# Patient Record
Sex: Female | Born: 1955 | Race: White | Hispanic: No | Marital: Single | Smoking: Never smoker
Health system: Southern US, Community
[De-identification: ages and names within clinical notes are randomized; demographics above are authoritative.]

## PROBLEM LIST (undated history)

## (undated) ENCOUNTER — Emergency Department (HOSPITAL_BASED_OUTPATIENT_CLINIC_OR_DEPARTMENT_OTHER): Discharge status: Absent without leave | Payer: BLUE CROSS/BLUE SHIELD

## (undated) DIAGNOSIS — Z87898 Personal history of other specified conditions: Secondary | ICD-10-CM

## (undated) DIAGNOSIS — I890 Lymphedema, not elsewhere classified: Secondary | ICD-10-CM

## (undated) DIAGNOSIS — F419 Anxiety disorder, unspecified: Secondary | ICD-10-CM

## (undated) DIAGNOSIS — C801 Malignant (primary) neoplasm, unspecified: Secondary | ICD-10-CM

## (undated) DIAGNOSIS — G43009 Migraine without aura, not intractable, without status migrainosus: Secondary | ICD-10-CM

## (undated) DIAGNOSIS — K9041 Non-celiac gluten sensitivity: Secondary | ICD-10-CM

## (undated) DIAGNOSIS — G4489 Other headache syndrome: Secondary | ICD-10-CM

## (undated) DIAGNOSIS — E785 Hyperlipidemia, unspecified: Secondary | ICD-10-CM

## (undated) DIAGNOSIS — E039 Hypothyroidism, unspecified: Secondary | ICD-10-CM

## (undated) DIAGNOSIS — C50919 Malignant neoplasm of unspecified site of unspecified female breast: Secondary | ICD-10-CM

## (undated) DIAGNOSIS — R232 Flushing: Secondary | ICD-10-CM

## (undated) DIAGNOSIS — G4700 Insomnia, unspecified: Secondary | ICD-10-CM

## (undated) DIAGNOSIS — R0789 Other chest pain: Secondary | ICD-10-CM

## (undated) DIAGNOSIS — F329 Major depressive disorder, single episode, unspecified: Secondary | ICD-10-CM

## (undated) DIAGNOSIS — D649 Anemia, unspecified: Secondary | ICD-10-CM

## (undated) DIAGNOSIS — Z923 Personal history of irradiation: Secondary | ICD-10-CM

## (undated) DIAGNOSIS — Z9221 Personal history of antineoplastic chemotherapy: Secondary | ICD-10-CM

## (undated) DIAGNOSIS — F32A Depression, unspecified: Secondary | ICD-10-CM

## (undated) DIAGNOSIS — B029 Zoster without complications: Secondary | ICD-10-CM

## (undated) DIAGNOSIS — Z973 Presence of spectacles and contact lenses: Secondary | ICD-10-CM

## (undated) DIAGNOSIS — Z8249 Family history of ischemic heart disease and other diseases of the circulatory system: Secondary | ICD-10-CM

## (undated) HISTORY — DX: Non-celiac gluten sensitivity: K90.41

## (undated) HISTORY — DX: Zoster without complications: B02.9

## (undated) HISTORY — DX: Personal history of other specified conditions: Z87.898

## (undated) HISTORY — DX: Migraine without aura, not intractable, without status migrainosus: G43.009

## (undated) HISTORY — DX: Family history of ischemic heart disease and other diseases of the circulatory system: Z82.49

## (undated) HISTORY — PX: GLAUCOMA SURGERY: SHX656

## (undated) HISTORY — DX: Major depressive disorder, single episode, unspecified: F32.9

## (undated) HISTORY — DX: Lymphedema, not elsewhere classified: I89.0

## (undated) HISTORY — PX: CATARACT EXTRACTION: SUR2

## (undated) HISTORY — DX: Depression, unspecified: F32.A

## (undated) HISTORY — PX: TONSILLECTOMY: SUR1361

## (undated) HISTORY — PX: DILATION AND CURETTAGE OF UTERUS: SHX78

## (undated) HISTORY — DX: Hypothyroidism, unspecified: E03.9

## (undated) HISTORY — DX: Hyperlipidemia, unspecified: E78.5

## (undated) HISTORY — DX: Flushing: R23.2

## (undated) HISTORY — PX: OTHER SURGICAL HISTORY: SHX169

## (undated) HISTORY — DX: Other headache syndrome: G44.89

## (undated) HISTORY — DX: Other chest pain: R07.89

## (undated) HISTORY — DX: Anxiety disorder, unspecified: F41.9

---

## 2003-08-24 ENCOUNTER — Ambulatory Visit (HOSPITAL_COMMUNITY): Admission: RE | Admit: 2003-08-24 | Discharge: 2003-08-24 | Payer: Self-pay | Admitting: Internal Medicine

## 2004-09-15 ENCOUNTER — Ambulatory Visit: Payer: Self-pay | Admitting: Internal Medicine

## 2005-02-25 ENCOUNTER — Ambulatory Visit: Payer: Self-pay | Admitting: Internal Medicine

## 2005-06-10 ENCOUNTER — Ambulatory Visit: Payer: Self-pay | Admitting: Internal Medicine

## 2006-04-13 ENCOUNTER — Ambulatory Visit: Payer: Self-pay | Admitting: Internal Medicine

## 2006-06-17 ENCOUNTER — Ambulatory Visit: Payer: Self-pay | Admitting: Internal Medicine

## 2006-06-23 ENCOUNTER — Other Ambulatory Visit: Admission: RE | Admit: 2006-06-23 | Discharge: 2006-06-23 | Payer: Self-pay | Admitting: Internal Medicine

## 2006-06-23 ENCOUNTER — Encounter (INDEPENDENT_AMBULATORY_CARE_PROVIDER_SITE_OTHER): Payer: Self-pay | Admitting: *Deleted

## 2006-06-23 ENCOUNTER — Ambulatory Visit: Payer: Self-pay | Admitting: Internal Medicine

## 2006-10-26 DIAGNOSIS — Z87898 Personal history of other specified conditions: Secondary | ICD-10-CM

## 2006-10-26 HISTORY — DX: Personal history of other specified conditions: Z87.898

## 2007-04-06 ENCOUNTER — Ambulatory Visit: Payer: Self-pay | Admitting: Internal Medicine

## 2007-04-06 LAB — CONVERTED CEMR LAB
ALT: 15 units/L (ref 0–40)
AST: 16 units/L (ref 0–37)
Albumin: 3.7 g/dL (ref 3.5–5.2)
Alkaline Phosphatase: 45 units/L (ref 39–117)
BUN: 12 mg/dL (ref 6–23)
Basophils Absolute: 0 10*3/uL (ref 0.0–0.1)
Basophils Relative: 0.6 % (ref 0.0–1.0)
Bilirubin, Direct: 0.1 mg/dL (ref 0.0–0.3)
CO2: 28 meq/L (ref 19–32)
Calcium: 9.4 mg/dL (ref 8.4–10.5)
Chloride: 102 meq/L (ref 96–112)
Creatinine, Ser: 0.8 mg/dL (ref 0.4–1.2)
Eosinophils Absolute: 0.2 10*3/uL (ref 0.0–0.6)
Eosinophils Relative: 3.5 % (ref 0.0–5.0)
Free T4: 0.8 ng/dL (ref 0.6–1.6)
GFR calc Af Amer: 97 mL/min
GFR calc non Af Amer: 80 mL/min
Glucose, Bld: 91 mg/dL (ref 70–99)
HCT: 36.6 % (ref 36.0–46.0)
Hemoglobin: 12.6 g/dL (ref 12.0–15.0)
Lymphocytes Relative: 38.7 % (ref 12.0–46.0)
MCHC: 34.4 g/dL (ref 30.0–36.0)
MCV: 89.4 fL (ref 78.0–100.0)
Magnesium: 2.3 mg/dL (ref 1.5–2.5)
Monocytes Absolute: 0.6 10*3/uL (ref 0.2–0.7)
Monocytes Relative: 8.1 % (ref 3.0–11.0)
Neutro Abs: 3.4 10*3/uL (ref 1.4–7.7)
Neutrophils Relative %: 49.1 % (ref 43.0–77.0)
Platelets: 357 10*3/uL (ref 150–400)
Potassium: 4.5 meq/L (ref 3.5–5.1)
RBC: 4.09 M/uL (ref 3.87–5.11)
RDW: 13.6 % (ref 11.5–14.6)
Sodium: 137 meq/L (ref 135–145)
TSH: 4.83 microintl units/mL (ref 0.35–5.50)
Total Bilirubin: 0.6 mg/dL (ref 0.3–1.2)
Total Protein: 7 g/dL (ref 6.0–8.3)
WBC: 6.9 10*3/uL (ref 4.5–10.5)

## 2007-05-11 ENCOUNTER — Encounter: Payer: Self-pay | Admitting: Internal Medicine

## 2007-05-18 ENCOUNTER — Encounter: Payer: Self-pay | Admitting: Internal Medicine

## 2007-05-20 ENCOUNTER — Telehealth: Payer: Self-pay | Admitting: Internal Medicine

## 2007-05-27 ENCOUNTER — Telehealth: Payer: Self-pay | Admitting: Internal Medicine

## 2007-06-09 ENCOUNTER — Encounter: Payer: Self-pay | Admitting: Internal Medicine

## 2007-10-22 ENCOUNTER — Emergency Department (HOSPITAL_COMMUNITY): Admission: EM | Admit: 2007-10-22 | Discharge: 2007-10-22 | Payer: Self-pay | Admitting: Emergency Medicine

## 2007-11-29 ENCOUNTER — Ambulatory Visit: Payer: Self-pay | Admitting: Internal Medicine

## 2007-11-29 DIAGNOSIS — R51 Headache: Secondary | ICD-10-CM | POA: Insufficient documentation

## 2007-11-29 DIAGNOSIS — J019 Acute sinusitis, unspecified: Secondary | ICD-10-CM | POA: Insufficient documentation

## 2007-11-29 DIAGNOSIS — J069 Acute upper respiratory infection, unspecified: Secondary | ICD-10-CM | POA: Insufficient documentation

## 2007-11-29 DIAGNOSIS — R519 Headache, unspecified: Secondary | ICD-10-CM | POA: Insufficient documentation

## 2007-11-29 DIAGNOSIS — H409 Unspecified glaucoma: Secondary | ICD-10-CM | POA: Insufficient documentation

## 2007-11-29 DIAGNOSIS — E039 Hypothyroidism, unspecified: Secondary | ICD-10-CM | POA: Insufficient documentation

## 2007-11-30 ENCOUNTER — Telehealth: Payer: Self-pay | Admitting: Internal Medicine

## 2007-12-01 ENCOUNTER — Telehealth: Payer: Self-pay | Admitting: Internal Medicine

## 2007-12-02 ENCOUNTER — Telehealth: Payer: Self-pay | Admitting: Internal Medicine

## 2008-05-25 ENCOUNTER — Telehealth: Payer: Self-pay | Admitting: *Deleted

## 2008-07-24 ENCOUNTER — Ambulatory Visit: Payer: Self-pay | Admitting: Internal Medicine

## 2008-07-24 DIAGNOSIS — F341 Dysthymic disorder: Secondary | ICD-10-CM | POA: Insufficient documentation

## 2008-07-24 DIAGNOSIS — E785 Hyperlipidemia, unspecified: Secondary | ICD-10-CM | POA: Insufficient documentation

## 2008-07-24 DIAGNOSIS — R5381 Other malaise: Secondary | ICD-10-CM | POA: Insufficient documentation

## 2008-07-24 DIAGNOSIS — N959 Unspecified menopausal and perimenopausal disorder: Secondary | ICD-10-CM | POA: Insufficient documentation

## 2008-07-24 DIAGNOSIS — R5383 Other fatigue: Secondary | ICD-10-CM

## 2008-07-26 ENCOUNTER — Encounter: Payer: Self-pay | Admitting: *Deleted

## 2008-07-31 ENCOUNTER — Telehealth: Payer: Self-pay | Admitting: *Deleted

## 2008-07-31 ENCOUNTER — Ambulatory Visit: Payer: Self-pay | Admitting: Internal Medicine

## 2008-08-27 ENCOUNTER — Telehealth: Payer: Self-pay | Admitting: Internal Medicine

## 2008-08-27 ENCOUNTER — Encounter: Payer: Self-pay | Admitting: Internal Medicine

## 2008-08-27 LAB — CONVERTED CEMR LAB
ALT: 14 units/L (ref 0–35)
AST: 17 units/L (ref 0–37)
Albumin: 3.8 g/dL (ref 3.5–5.2)
Alkaline Phosphatase: 38 units/L — ABNORMAL LOW (ref 39–117)
BUN: 17 mg/dL (ref 6–23)
Basophils Absolute: 0 10*3/uL (ref 0.0–0.1)
Basophils Relative: 0.3 % (ref 0.0–3.0)
Bilirubin, Direct: 0.1 mg/dL (ref 0.0–0.3)
CO2: 29 meq/L (ref 19–32)
Calcium: 9.2 mg/dL (ref 8.4–10.5)
Chloride: 102 meq/L (ref 96–112)
Cholesterol: 194 mg/dL (ref 0–200)
Creatinine, Ser: 0.8 mg/dL (ref 0.4–1.2)
Eosinophils Absolute: 0.3 10*3/uL (ref 0.0–0.7)
Eosinophils Relative: 4.1 % (ref 0.0–5.0)
GFR calc Af Amer: 96 mL/min
GFR calc non Af Amer: 79 mL/min
Glucose, Bld: 95 mg/dL (ref 70–99)
HCT: 34.8 % — ABNORMAL LOW (ref 36.0–46.0)
HDL: 39.3 mg/dL (ref >39.0)
Hemoglobin: 11.7 g/dL — ABNORMAL LOW (ref 12.0–15.0)
LDL Cholesterol: 137 mg/dL — ABNORMAL HIGH (ref 0–99)
Lymphocytes Relative: 37.1 % (ref 12.0–46.0)
MCHC: 33.7 g/dL (ref 30.0–36.0)
MCV: 92.1 fL (ref 78.0–100.0)
Monocytes Absolute: 0.7 10*3/uL (ref 0.1–1.0)
Monocytes Relative: 10.4 % (ref 3.0–12.0)
Neutro Abs: 3.2 10*3/uL (ref 1.4–7.7)
Neutrophils Relative %: 48.1 % (ref 43.0–77.0)
Platelets: 315 10*3/uL (ref 150–400)
Potassium: 4.4 meq/L (ref 3.5–5.1)
RBC: 3.78 M/uL — ABNORMAL LOW (ref 3.87–5.11)
RDW: 13.7 % (ref 11.5–14.6)
Sodium: 138 meq/L (ref 135–145)
TSH: 5.1 microintl units/mL (ref 0.35–5.50)
Total Bilirubin: 0.7 mg/dL (ref 0.3–1.2)
Total CHOL/HDL Ratio: 4.9
Total Protein: 6.9 g/dL (ref 6.0–8.3)
Triglycerides: 90 mg/dL (ref 0–149)
VLDL: 18 mg/dL (ref 0–40)
WBC: 6.7 10*3/uL (ref 4.5–10.5)

## 2009-05-20 ENCOUNTER — Telehealth: Payer: Self-pay | Admitting: *Deleted

## 2011-03-13 NOTE — Letter (Signed)
June 03, 2007    Chalmers Guest, M.D.  [address]  [name]  [place], [place] [address]   RE:  Kimberly Reed, Kimberly Reed  MRN:  [id number]  /  DOB:  1955-12-05   Dear Dr. Harlon Flor,   After review of [name] neurologic evaluation and my current  evaluation she should average risk to undergo her glaucoma surgery.   Her EKG on June 23, 2006 was within normal limits.  She is on thyroid  replacement therapy and her lab work of 06/08 was within normal limits.   Please let us know if you need more information.    Sincerely,      Neta Mends. Panosh, MD  Electronically Signed    WKP/MedQ  DD: 06/03/2007  DT: 06/03/2007  Job #: [id number]

## 2011-03-16 ENCOUNTER — Ambulatory Visit (INDEPENDENT_AMBULATORY_CARE_PROVIDER_SITE_OTHER): Payer: Managed Care, Other (non HMO) | Admitting: Internal Medicine

## 2011-03-16 ENCOUNTER — Encounter: Payer: Self-pay | Admitting: Internal Medicine

## 2011-03-16 VITALS — BP 150/100 | HR 78 | Temp 98.4°F | Wt 298.0 lb

## 2011-03-16 DIAGNOSIS — E785 Hyperlipidemia, unspecified: Secondary | ICD-10-CM

## 2011-03-16 DIAGNOSIS — Z8249 Family history of ischemic heart disease and other diseases of the circulatory system: Secondary | ICD-10-CM

## 2011-03-16 DIAGNOSIS — R030 Elevated blood-pressure reading, without diagnosis of hypertension: Secondary | ICD-10-CM

## 2011-03-16 DIAGNOSIS — Z5989 Other problems related to housing and economic circumstances: Secondary | ICD-10-CM

## 2011-03-16 DIAGNOSIS — E039 Hypothyroidism, unspecified: Secondary | ICD-10-CM

## 2011-03-16 DIAGNOSIS — F341 Dysthymic disorder: Secondary | ICD-10-CM

## 2011-03-16 DIAGNOSIS — Z5971 Insufficient health insurance coverage: Secondary | ICD-10-CM

## 2011-03-16 DIAGNOSIS — J069 Acute upper respiratory infection, unspecified: Secondary | ICD-10-CM

## 2011-03-16 DIAGNOSIS — E669 Obesity, unspecified: Secondary | ICD-10-CM

## 2011-03-16 DIAGNOSIS — Z598 Other problems related to housing and economic circumstances: Secondary | ICD-10-CM

## 2011-03-16 DIAGNOSIS — E119 Type 2 diabetes mellitus without complications: Secondary | ICD-10-CM

## 2011-03-16 LAB — BASIC METABOLIC PANEL
BUN: 15 mg/dL (ref 6–23)
CO2: 28 mEq/L (ref 19–32)
Calcium: 10 mg/dL (ref 8.4–10.5)
Chloride: 100 mEq/L (ref 96–112)
Creatinine, Ser: 0.9 mg/dL (ref 0.4–1.2)
GFR: 68.64 mL/min (ref >60.00)
Glucose, Bld: 88 mg/dL (ref 70–99)
Potassium: 5.7 mEq/L — ABNORMAL HIGH (ref 3.5–5.1)
Sodium: 137 mEq/L (ref 135–145)

## 2011-03-16 LAB — LDL CHOLESTEROL, DIRECT: Direct LDL: 167.3 mg/dL

## 2011-03-16 LAB — CBC WITH DIFFERENTIAL/PLATELET
Basophils Absolute: 0 10*3/uL (ref 0.0–0.1)
Basophils Relative: 0.4 % (ref 0.0–3.0)
Eosinophils Absolute: 0.3 10*3/uL (ref 0.0–0.7)
Eosinophils Relative: 3.3 % (ref 0.0–5.0)
HCT: 39.4 % (ref 36.0–46.0)
Hemoglobin: 13.1 g/dL (ref 12.0–15.0)
Lymphocytes Relative: 34.4 % (ref 12.0–46.0)
Lymphs Abs: 2.8 10*3/uL (ref 0.7–4.0)
MCHC: 33.3 g/dL (ref 30.0–36.0)
MCV: 88 fl (ref 78.0–100.0)
Monocytes Absolute: 0.7 10*3/uL (ref 0.1–1.0)
Monocytes Relative: 8.1 % (ref 3.0–12.0)
Neutro Abs: 4.3 10*3/uL (ref 1.4–7.7)
Neutrophils Relative %: 53.8 % (ref 43.0–77.0)
Platelets: 370 10*3/uL (ref 150.0–400.0)
RBC: 4.48 Mil/uL (ref 3.87–5.11)
RDW: 15.7 % — ABNORMAL HIGH (ref 11.5–14.6)
WBC: 8.1 10*3/uL (ref 4.5–10.5)

## 2011-03-16 LAB — LIPID PANEL
Cholesterol: 201 mg/dL — ABNORMAL HIGH (ref 0–200)
HDL: 38.9 mg/dL — ABNORMAL LOW (ref >39.00)
Total CHOL/HDL Ratio: 5
Triglycerides: 116 mg/dL (ref 0.0–149.0)
VLDL: 23.2 mg/dL (ref 0.0–40.0)

## 2011-03-16 LAB — HEPATIC FUNCTION PANEL
ALT: 46 U/L — ABNORMAL HIGH (ref 0–35)
AST: 31 U/L (ref 0–37)
Albumin: 3.8 g/dL (ref 3.5–5.2)
Alkaline Phosphatase: 61 U/L (ref 39–117)
Bilirubin, Direct: 0 mg/dL (ref 0.0–0.3)
Total Bilirubin: 0.6 mg/dL (ref 0.3–1.2)
Total Protein: 6.9 g/dL (ref 6.0–8.3)

## 2011-03-16 LAB — TSH: TSH: 4.52 u[IU]/mL (ref 0.35–5.50)

## 2011-03-16 MED ORDER — DULOXETINE HCL 30 MG PO CPEP: ORAL_CAPSULE | ORAL | Status: DC

## 2011-03-16 NOTE — Patient Instructions (Signed)
Someone will call you about  Cardiology referral.  Continue weight loss   Efforts for health reason Lab today.  Consider adding cymbalta  But  Can raise bp a few mm. Avoid sodium in your diet .  Ask you eye doc about all meds in  Regard to  You glaucoma. Can begin cymbalta 30 mg  Per day and when nausea subsides increase to 60 mg

## 2011-03-16 NOTE — Progress Notes (Signed)
Subjective:    Patient ID: Kimberly Reed, female    DOB: 03/04/1956, 55 y.o.   MRN: [id number]  HPI Patient comes in today scheduled as a "fu" but havenet seen in this practice  for about 2 years and has been getting care and meds  From above.   Now she comes in with multiple issues   .  Was seeing[facility]  [facility]  Had lost job and insurance  Hypothyroid: had labs done and lipids were borderline.  ? If on  Brand medication of  Thyroid   ? On brand.    Forgetful but only ocass missing.   2 x per day.  / last check   Last summer 2011.     Anxiety:  Hx of se of antidepressants Migraine  with prozac and xoloft  Meds..  On remeron.   For 2 years hx depression since childhood but functions    Ok through out  . Not suicidal or hopeless. Has Eye doc   fam hx : One with autism.  Family hx of Aneurysm:   Bro age 33 years  and father ? If thoracic type.   Social :  [employer]      For over a year.   Hhof 1  2 cats  No tad  8 hours of sleep.  Review of Systems Neg for CP sob wheeze HA .  Gi gu stable .  No OSA?  Rest of ros neg or as above .  Past Medical History  Diagnosis Date  . Hypothyroid   . Glaucoma   . Hyperlipemia   . Migraine   . History of syncope 2008    loss of bladder control eval by Neuro   Past Surgical History  Procedure Date  . Cataract extraction   . Glaucoma surgery     reports that she has never smoked. She does not have any smokeless tobacco history on file. She reports that she uses illicit drugs. She reports that she does not drink alcohol. family history includes Aneurysm in her mother; Dementia in her father; Hypertension in her father; and Kidney disease in her brother and mother. No Known Allergies     Objective:   Physical Exam Physical Exam: Vital signs reviewed WUJ:WJXB is a well-developed well-nourished alert cooperative  white female who appears her stated age in no acute distress.  Congested   HEENT: normocephalic   traumatic , Eyes: PERRL EOM's full, conjunctiva clear, Nares: increase turbinates t no deformity discharge or tenderness., Ears: no deformity EAC's clear TMs with normal landmarks. Mouth: clear OP, no lesions, edema. Low palate  Moist mucous membranes. Dentition in adequate repair. NECK: supple without masses, thyromegaly or bruits. CHEST/PULM:  Clear to auscultation and percussion breath sounds equal no wheeze , rales or rhonchi. No chest wall deformities or tenderness. CV: PMI is nondisplaced, S1 S2 no gallops, murmurs, rubs. Peripheral pulses are full without delay.No JVD .  ABDOMEN: Bowel sounds normal nontender  No guard or rebound, no hepato splenomegal no CVA tenderness.   Extremtities:  No clubbing cyanosis or edema, no acute joint swelling or redness no focal atrophy NEURO:  Oriented x3, cranial nerves 3-12 appear to be intact, no obvious focal weakness, SKIN: No acute rashes normal turgor, color, no bruising or petechiae. PSYCH: Oriented, good eye contact, , cognition and judgment appear normal. Minimally depressed  Nl speech.        Assessment & Plan:  Hypothyroid: need level checked and  refill accordingly fam hx of AA ? Thoracic  Brother  Age 42 and mom ? Type. conern about risk  Reasonable to do assessment  Obesity  Denies sleep apnea Mood anxiety depression  Want to try cymbalta  Had  Issues with meds in the past   Didn't have insurance   . See below  Disc se and nausea possibility . Glaucoma  Per eye follow  URI  [prob viral  Today  Getting better. No alarm features  Total visit > 50% spent counseling and coordinating care  And making plan.

## 2011-03-23 ENCOUNTER — Encounter: Payer: Self-pay | Admitting: Internal Medicine

## 2011-03-24 ENCOUNTER — Encounter: Payer: Self-pay | Admitting: Cardiovascular Disease

## 2011-03-25 ENCOUNTER — Telehealth: Payer: Self-pay | Admitting: *Deleted

## 2011-03-25 NOTE — Telephone Encounter (Signed)
Left message to call back  

## 2011-03-25 NOTE — Telephone Encounter (Signed)
Message copied by Romualdo Bolk on Wed Mar 25, 2011  3:03 PM ------      Message from: [facility] [place] [facility]      Created: Wed Mar 25, 2011  9:24 AM       Tell patient results  Lipids somewhat elevated .  Liver minor abnormalities prob insig.  Potassium elevated and could be from blood draw effect  But make sure she is not taking a potassium supplement.  Thyroid  Level Could be better. (If not many  missed doses) would increase med to synthroid .100mg   Per day.disp 30 and then plan repeat tsh in 6-8 weeks .  We can discuss this at her follow up visit  Also.

## 2011-03-26 MED ORDER — SYNTHROID 100 MCG PO TABS: 100.0000 ug | ORAL_TABLET | Freq: Every day | ORAL | Status: DC

## 2011-03-26 NOTE — Telephone Encounter (Signed)
Pt aware of results. Pt wants rx called in [facility]. She states that she may have missed one dose but we are going ahead and increasing it.

## 2011-03-31 ENCOUNTER — Ambulatory Visit: Payer: Managed Care, Other (non HMO) | Admitting: Cardiovascular Disease

## 2011-06-15 ENCOUNTER — Other Ambulatory Visit: Payer: Self-pay | Admitting: Internal Medicine

## 2011-07-10 ENCOUNTER — Encounter: Payer: Self-pay | Admitting: Internal Medicine

## 2011-07-10 ENCOUNTER — Ambulatory Visit (INDEPENDENT_AMBULATORY_CARE_PROVIDER_SITE_OTHER): Payer: Managed Care, Other (non HMO) | Admitting: Internal Medicine

## 2011-07-10 VITALS — BP 110/72 | HR 93 | Temp 98.3°F

## 2011-07-10 DIAGNOSIS — E039 Hypothyroidism, unspecified: Secondary | ICD-10-CM

## 2011-07-10 DIAGNOSIS — L089 Local infection of the skin and subcutaneous tissue, unspecified: Secondary | ICD-10-CM

## 2011-07-10 DIAGNOSIS — D485 Neoplasm of uncertain behavior of skin: Secondary | ICD-10-CM | POA: Insufficient documentation

## 2011-07-10 DIAGNOSIS — R030 Elevated blood-pressure reading, without diagnosis of hypertension: Secondary | ICD-10-CM

## 2011-07-10 DIAGNOSIS — H409 Unspecified glaucoma: Secondary | ICD-10-CM

## 2011-07-10 DIAGNOSIS — F341 Dysthymic disorder: Secondary | ICD-10-CM

## 2011-07-10 LAB — TSH: TSH: 1.66 u[IU]/mL (ref 0.35–5.50)

## 2011-07-10 MED ORDER — DOXYCYCLINE HYCLATE 100 MG PO TABS: 100.0000 mg | ORAL_TABLET | Freq: Two times a day (BID) | ORAL | Status: AC

## 2011-07-10 MED ORDER — SYNTHROID 100 MCG PO TABS: 100.0000 ug | ORAL_TABLET | Freq: Every day | ORAL | Status: DC

## 2011-07-10 NOTE — Assessment & Plan Note (Signed)
Doing better clinically since changing to brand Synthroid and increasing dose we'll check labs today followup depending on results

## 2011-07-10 NOTE — Assessment & Plan Note (Signed)
Decided not to try the Cymbalta. Coping okay

## 2011-07-10 NOTE — Progress Notes (Signed)
  Subjective:    Patient ID: Kimberly Reed, female    DOB: [birth year], 55 y.o.   MRN: [id number]  HPI Patient comes in today for followup of a number of problems.  Hypothyroid: His last visit we increased her dose can change to Synthroid brand. She feels that her energy level is better although her skin is still dry. This was done in May. She is due for a lab test and a refill.  Cymbalta was never taken because she thought that she should handle things herself and is coping. She is working 40 hours a week still.  New problem area abnormal on her right shoulder blade that has been there for years and now is irritated and bleeds a lot would like to college he does see it. Not really itchy it has been somewhat red for a week but she can't see it really well because it's on her back.   Review of Systems Negative for chest pain shortness of breath she saw the eye doctor this morning for her glaucoma.  CC Dr. Harlon Flor , hand eczema    doesn't like to use a lot of chemicals on her skin still uses olive oil mostly.  Past Medical History  Diagnosis Date  . Hypothyroid   . Glaucoma   . Hyperlipemia   . Migraine   . History of syncope 2008    loss of bladder control eval by Neuro   Past Surgical History  Procedure Date  . Cataract extraction   . Glaucoma surgery     reports that she has never smoked. She does not have any smokeless tobacco history on file. She reports that she uses illicit drugs. She reports that she does not drink alcohol. family history includes Aneurysm in her mother; Dementia in her father; Depression in an unspecified family member; Heart disease in an unspecified family member; Heart failure in her cousin; Hypertension in her father; and Kidney disease in her brother and mother. No Known Allergies     Objective:   Physical Exam Well-developed well-nourished in no acute distress wearing dark glasses. heent grossly ok otherwise Neck no masses  Chest:  Nl  respirations CV:  S1-S2 no gallops or murmurs peripheral perfusion is normal SKIN:   Right upper back with ? Mole reddened partly avulsed crusted with surrounding 2-3 cm of redness  and induration  No pus or abscess   Dry skin  Right hand eczema noted   Face clear   Reviewed record     Assessment & Plan:

## 2011-07-10 NOTE — Assessment & Plan Note (Signed)
Normal blood pressure today. 

## 2011-07-10 NOTE — Patient Instructions (Addendum)
The skin  Looks infected   And we will add antibiotic pills if needed  and you should  Use antibiotic  ointment also 2 x per day. Will  Arrange a dermatology apt for this. Will refill the synthroid  meds  And let you know of results of labs tests and then follow up.

## 2011-07-10 NOTE — Assessment & Plan Note (Signed)
Looks infected today and may just be a simple mole  that has been partly avulsed and secondarily infected but would like dermatology to see.  Will treat for infection and get referral. Local care discussed

## 2011-07-14 ENCOUNTER — Encounter: Payer: Self-pay | Admitting: *Deleted

## 2011-07-14 ENCOUNTER — Telehealth: Payer: Self-pay | Admitting: *Deleted

## 2011-07-14 NOTE — Telephone Encounter (Signed)
Pt had a headache and nausea with Doxycycline.  Per Dr. Fabian Sharp, continue with the meds, and see how she feels.  Call with problems.

## 2011-08-03 ENCOUNTER — Other Ambulatory Visit: Payer: Self-pay

## 2011-12-01 ENCOUNTER — Telehealth: Payer: Self-pay | Admitting: Internal Medicine

## 2011-12-01 NOTE — Telephone Encounter (Signed)
Need status of [employer] form, that she dropped off last week. Please return call before noon. Thanks.

## 2011-12-01 NOTE — Telephone Encounter (Signed)
Pt aware of this. Pt to come in thurs around 10am.

## 2011-12-04 DIAGNOSIS — Z0279 Encounter for issue of other medical certificate: Secondary | ICD-10-CM

## 2012-11-08 ENCOUNTER — Telehealth: Payer: Self-pay | Admitting: Internal Medicine

## 2012-11-08 NOTE — Telephone Encounter (Signed)
Pt is having job stress requesting appt around 415pm. Please advice

## 2012-11-09 NOTE — Telephone Encounter (Signed)
Pt is following up on request to see you and would like to come in on Monday anytime if possible.

## 2012-11-09 NOTE — Telephone Encounter (Signed)
3 45 appt   Save the 4 pm  As a  Per doctor sick kids only

## 2012-11-10 NOTE — Telephone Encounter (Signed)
Pt is sch for 11-14-2012

## 2012-11-10 NOTE — Telephone Encounter (Signed)
LMOM for pt appt

## 2012-11-14 ENCOUNTER — Ambulatory Visit (INDEPENDENT_AMBULATORY_CARE_PROVIDER_SITE_OTHER): Payer: Managed Care, Other (non HMO) | Admitting: Internal Medicine

## 2012-11-14 ENCOUNTER — Encounter: Payer: Self-pay | Admitting: Internal Medicine

## 2012-11-14 VITALS — HR 93 | Temp 98.3°F | Wt 270.0 lb

## 2012-11-14 DIAGNOSIS — M79672 Pain in left foot: Secondary | ICD-10-CM

## 2012-11-14 DIAGNOSIS — Z569 Unspecified problems related to employment: Secondary | ICD-10-CM

## 2012-11-14 DIAGNOSIS — Z566 Other physical and mental strain related to work: Secondary | ICD-10-CM

## 2012-11-14 DIAGNOSIS — R413 Other amnesia: Secondary | ICD-10-CM

## 2012-11-14 DIAGNOSIS — M79609 Pain in unspecified limb: Secondary | ICD-10-CM

## 2012-11-14 DIAGNOSIS — M722 Plantar fascial fibromatosis: Secondary | ICD-10-CM

## 2012-11-14 NOTE — Patient Instructions (Addendum)
Get a copy of blood test to [place]  .  If you thyroid is in range then the problems  you are having are probably due  to stress.     Suggest see a Veterinary surgeon  .    [facility] associates if in your network.   stress can decrease short term  Memory .  Also lack of sleep can do this .       Plantar Fasciitis Plantar fasciitis is a common condition that causes foot pain. It is soreness (inflammation) of the band of tough fibrous tissue on the bottom of the foot that runs from the heel bone (calcaneus) to the ball of the foot. The cause of this soreness may be from excessive standing, poor fitting shoes, running on hard surfaces, being overweight, having an abnormal walk, or overuse (this is common in runners) of the painful foot or feet. It is also common in aerobic exercise dancers and ballet dancers. SYMPTOMS  Most people with plantar fasciitis complain of:  Severe pain in the morning on the bottom of their foot especially when taking the first steps out of bed. This pain recedes after a few minutes of walking.  Severe pain is experienced also during walking following a long period of inactivity.  Pain is worse when walking barefoot or up stairs DIAGNOSIS   Your caregiver will diagnose this condition by examining and feeling your foot.  Special tests such as X-rays of your foot, are usually not needed. PREVENTION   Consult a sports medicine professional before beginning a new exercise program.  Walking programs offer a good workout. With walking there is a lower chance of overuse injuries common to runners. There is less impact and less jarring of the joints.  Begin all new exercise programs slowly. If problems or pain develop, decrease the amount of time or distance until you are at a comfortable level.  Wear good shoes and replace them regularly.  Stretch your foot and the heel cords at the back of the ankle (Achilles tendon) both before and after exercise.  Run or exercise on  even surfaces that are not hard. For example, asphalt is better than pavement.  Do not run barefoot on hard surfaces.  If using a treadmill, vary the incline.  Do not continue to workout if you have foot or joint problems. Seek professional help if they do not improve. HOME CARE INSTRUCTIONS   Avoid activities that cause you pain until you recover.  Use ice or cold packs on the problem or painful areas after working out.  Only take over-the-counter or prescription medicines for pain, discomfort, or fever as directed by your caregiver.  Soft shoe inserts or athletic shoes with air or gel sole cushions may be helpful.  If problems continue or become more severe, consult a sports medicine caregiver or your own health care provider. Cortisone is a potent anti-inflammatory medication that may be injected into the painful area. You can discuss this treatment with your caregiver. MAKE SURE YOU:   Understand these instructions.  Will watch your condition.  Will get help right away if you are not doing well or get worse. Document Released: 07/07/2001 Document Revised: 01/04/2012 Document Reviewed: 09/05/2008 [facility] Patient Information 2013 [place], [place].  Stress Stress-related medical problems are becoming increasingly common. The body has a built-in physical response to stressful situations. Faced with pressure, challenge or danger, we need to react quickly. Our bodies release hormones such as cortisol and adrenaline to help do this. These  hormones are part of the "fight or flight" response and affect the metabolic rate, heart rate and blood pressure, resulting in a heightened, stressed state that prepares the body for optimum performance in dealing with a stressful situation. It is likely that early man required these mechanisms to stay alive, but usually modern stresses do not call for this, and the same hormones released in today's world can damage health and reduce coping  ability. CAUSES  Pressure to perform at work, at school or in sports.  Threats of physical violence.  Money worries.  Arguments.  Family conflicts.  Divorce or separation from significant other.  Bereavement.  New job or unemployment.  Changes in location.  Alcohol or drug abuse. SOMETIMES, THERE IS NO PARTICULAR REASON FOR DEVELOPING STRESS. Almost all people are at risk of being stressed at some time in their lives. It is important to know that some stress is temporary and some is long term.  Temporary stress will go away when a situation is resolved. Most people can cope with short periods of stress, and it can often be relieved by relaxing, taking a walk, chatting through issues with friends, or having a good night's sleep.  Chronic (long-term, continuous) stress is much harder to deal with. It can be psychologically and emotionally damaging. It can be harmful both for an individual and for friends and family. SYMPTOMS Everyone reacts to stress differently. There are some common effects that help [place] recognize it. In times of extreme stress, people may:  Shake uncontrollably.  Breathe faster and deeper than normal (hyperventilate).  Vomit.  For people with asthma, stress can trigger an attack.  For some people, stress may trigger migraine headaches, ulcers, and body pain. PHYSICAL EFFECTS OF STRESS MAY INCLUDE:  Loss of energy.  Skin problems.  Aches and pains resulting from tense muscles, including neck ache, backache and tension headaches.  Increased pain from arthritis and other conditions.  Irregular heart beat (palpitations).  Periods of irritability or anger.  Apathy or depression.  Anxiety (feeling uptight or worrying).  Unusual behavior.  Loss of appetite.  Comfort eating.  Lack of concentration.  Loss of, or decreased, sex-drive.  Increased smoking, drinking, or recreational drug use.  For women, missed periods.  Ulcers, joint pain,  and muscle pain. Post-traumatic stress is the stress caused by any serious accident, strong emotional damage, or extremely difficult or violent experience such as rape or war. Post-traumatic stress victims can experience mixtures of emotions such as fear, shame, depression, guilt or anger. It may include recurrent memories or images that may be haunting. These feelings can last for weeks, months or even years after the traumatic event that triggered them. Specialized treatment, possibly with medicines and psychological therapies, is available. If stress is causing physical symptoms, severe distress or making it difficult for you to function as normal, it is worth seeing your caregiver. It is important to remember that although stress is a usual part of life, extreme or prolonged stress can lead to other illnesses that will need treatment. It is better to visit a doctor sooner rather than later. Stress has been linked to the development of high blood pressure and heart disease, as well as insomnia and depression. There is no diagnostic test for stress since everyone reacts to it differently. But a caregiver will be able to spot the physical symptoms, such as:  Headaches.  Shingles.  Ulcers. Emotional distress such as intense worry, low mood or irritability should be detected when the doctor  asks pertinent questions to identify any underlying problems that might be the cause. In case there are physical reasons for the symptoms, the doctor may also want to do some tests to exclude certain conditions. If you feel that you are suffering from stress, try to identify the aspects of your life that are causing it. Sometimes you may not be able to change or avoid them, but even a small change can have a positive ripple effect. A simple lifestyle change can make all the difference. STRATEGIES THAT CAN HELP DEAL WITH STRESS:  Delegating or sharing responsibilities.  Avoiding confrontations.  Learning to be  more assertive.  Regular exercise.  Avoid using alcohol or street drugs to cope.  Eating a healthy, balanced diet, rich in fruit and vegetables and proteins.  Finding humor or absurdity in stressful situations.  Never taking on more than you know you can handle comfortably.  Organizing your time better to get as much done as possible.  Talking to friends or family and sharing your thoughts and fears.  Listening to music or relaxation tapes.  Tensing and then relaxing your muscles, starting at the toes and working up to the head and neck. If you think that you would benefit from help, either in identifying the things that are causing your stress or in learning techniques to help you relax, see a caregiver who is capable of helping you with this. Rather than relying on medications, it is usually better to try and identify the things in your life that are causing stress and try to deal with them. There are many techniques of managing stress including counseling, psychotherapy, aromatherapy, yoga, and exercise. Your caregiver can help you determine what is best for you. Document Released: 01/02/2003 Document Revised: 01/04/2012 Document Reviewed: 11/29/2007 [facility] Patient Information 2013 [place], [place].

## 2012-11-14 NOTE — Progress Notes (Signed)
Chief Complaint  Patient presents with  . Stress    Stated she has had this issue before but feels that it is exculating because of issues at work.  Worsened around Thanksgiving.  Her father passes after Thanksgiving.  She wonders if she is getting dementia.  [name] Anxiety    HPI: Last visit was over a year ago; asked to come ibn because of problems with stress   Works [employer] .   Thinks burning out  And with new structure  Gotten worse  Threatening  Job loss.   Having a hard time cause of more anxious.  Similar sx  But never to this degree.   October changes and new supervisor  .   Dad passed   From dementia. She has a hard time remembering things and feels disorganized her shift change was also changed she's under care for her thyroid at another practice. [facility]. And apparently has had recent lab work that was good.  Now  Seeing Dr Zella Ball at [facility]  And had  tsh  Done there.    And said she was going to be written out.   ROS: See pertinent positives and negatives per HPI. No syncope shortness of breath edema unusual bleeding. Has tried to lose weight with walking has had some left heel pain no other injury.  Past Medical History  Diagnosis Date  . Hypothyroid   . Glaucoma(365)   . Hyperlipemia   . Migraine   . History of syncope 2008    loss of bladder control eval by Neuro    Family History  Problem Relation Age of Onset  . Aneurysm Mother   . Kidney disease Mother   . Hypertension Father   . Dementia Father   . Kidney disease Brother   . Heart disease      "all of moms side"  . Heart failure Cousin   . Depression      History   Social History  . Marital Status: Single    Spouse Name: N/A    Number of Children: N/A  . Years of Education: N/A   Social History Main Topics  . Smoking status: Never Smoker   . Smokeless tobacco: None  . Alcohol Use: No  . Drug Use: Yes     Comment: in 20's but not now  . Sexually Active: Yes -- Female  partner(s)   Other Topics Concern  . 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  [place] 1  2 cats No tad 8 hours of sleep.     Outpatient Encounter Prescriptions as of 11/14/2012  Medication Sig Dispense Refill  . brimonidine-timolol (COMBIGAN) 0.2-0.5 % ophthalmic solution Place 1 drop into both eyes every 12 (twelve) hours.        . dorzolamide (TRUSOPT) 2 % ophthalmic solution 1 drop 3 (three) times daily.        . METHYLCOBALAMIN PO Take by mouth.      . Thyroid (NATURE-THROID PO) Take by mouth.      . TRAVOPROST OP Apply to eye.      . [DISCONTINUED] bimatoprost (LUMIGAN) 0.03 % ophthalmic solution 1 drop at bedtime.        . [DISCONTINUED] mirtazapine (REMERON) 7.5 MG tablet Take 7.5 mg by mouth at bedtime.        . [DISCONTINUED] SYNTHROID 100 MCG tablet Take 1 tablet (100  mcg total) by mouth daily.  30 tablet  12    EXAM:  Pulse 93  Temp 98.3 F (36.8 C) (Oral)  Wt 270 lb (122.471 kg)  SpO2 96%  There is no height on file to calculate BMI.  GENERAL: vitals reviewed and listed above, alert, oriented, appears well hydrated and in no acute distress  HEENT: atraumatic, conjunctiva  clear, no obvious abnormalities on inspection of external nose and ears OP : no lesion edema or exudate   NECK: no obvious masses on inspection palpation no adenopathy  LUNGS: clear to auscultation bilaterally, no wheezes, rales or rhonchi, good air movement  CV: HRRR, no clubbing cyanosis or  peripheral edema nl cap refill  Abdomen soft without organomegaly guarding or rebound.  MS: moves all extremities without noticeable focal  abnormality  PSYCH:  cooperative, somewhat flat and worried affect looks a bit depressed not particularly anxious. Looks stressed  Lab Results  Component Value Date   WBC 8.1 03/16/2011   HGB 13.1 03/16/2011   HCT 39.4 03/16/2011   PLT 370.0 03/16/2011   GLUCOSE 88 03/16/2011   CHOL 201* 03/16/2011   TRIG 116.0  03/16/2011   HDL 38.90* 03/16/2011   LDLDIRECT 167.3 03/16/2011   LDLCALC 137* 07/24/2008   ALT 46* 03/16/2011   AST 31 03/16/2011   NA 137 03/16/2011   K 5.7* 03/16/2011   CL 100 03/16/2011   CREATININE 0.9 03/16/2011   BUN 15 03/16/2011   CO2 28 03/16/2011   TSH 1.66 07/10/2011    ASSESSMENT AND PLAN:  Discussed the following assessment and plan:  1. Memory difficulties    Probably from stress based on history as well as change in sleep schedule. There is a family history of dementia and father would suggest seeing counselor first  2. Stress at work   3. Pain of left heel    Probable plantar fasciitis. Information discussed handout.  4. Plantar fasciitis of left foot   about 15 months  since last labs  -Patient advised to return or notify health care team  immediately if symptoms worsen or persist or new concerns arise.  Patient Instructions  Get a copy of blood test to [place]  .  If you thyroid is in range then the problems  you are having are probably due  to stress.     Suggest see a Veterinary surgeon  .    [facility] if in your network.   stress can decrease short term  Memory .  Also lack of sleep can do this .       Plantar Fasciitis Plantar fasciitis is a common condition that causes foot pain. It is soreness (inflammation) of the band of tough fibrous tissue on the bottom of the foot that runs from the heel bone (calcaneus) to the ball of the foot. The cause of this soreness may be from excessive standing, poor fitting shoes, running on hard surfaces, being overweight, having an abnormal walk, or overuse (this is common in runners) of the painful foot or feet. It is also common in aerobic exercise dancers and ballet dancers. SYMPTOMS  Most people with plantar fasciitis complain of:  Severe pain in the morning on the bottom of their foot especially when taking the first steps out of bed. This pain recedes after a few minutes of walking.  Severe pain is experienced also  during walking following a long period of inactivity.  Pain is worse when walking barefoot or up stairs DIAGNOSIS  Your caregiver will diagnose this condition by examining and feeling your foot.  Special tests such as X-rays of your foot, are usually not needed. PREVENTION   Consult a sports medicine professional before beginning a new exercise program.  Walking programs offer a good workout. With walking there is a lower chance of overuse injuries common to runners. There is less impact and less jarring of the joints.  Begin all new exercise programs slowly. If problems or pain develop, decrease the amount of time or distance until you are at a comfortable level.  Wear good shoes and replace them regularly.  Stretch your foot and the heel cords at the back of the ankle (Achilles tendon) both before and after exercise.  Run or exercise on even surfaces that are not hard. For example, asphalt is better than pavement.  Do not run barefoot on hard surfaces.  If using a treadmill, vary the incline.  Do not continue to workout if you have foot or joint problems. Seek professional help if they do not improve. HOME CARE INSTRUCTIONS   Avoid activities that cause you pain until you recover.  Use ice or cold packs on the problem or painful areas after working out.  Only take over-the-counter or prescription medicines for pain, discomfort, or fever as directed by your caregiver.  Soft shoe inserts or athletic shoes with air or gel sole cushions may be helpful.  If problems continue or become more severe, consult a sports medicine caregiver or your own health care provider. Cortisone is a potent anti-inflammatory medication that may be injected into the painful area. You can discuss this treatment with your caregiver. MAKE SURE YOU:   Understand these instructions.  Will watch your condition.  Will get help right away if you are not doing well or get worse. Document Released:  07/07/2001 Document Revised: 01/04/2012 Document Reviewed: 09/05/2008 [facility] [place] [facility] Patient Information 2013 [place], [place].  Stress Stress-related medical problems are becoming increasingly common. The body has a built-in physical response to stressful situations. Faced with pressure, challenge or danger, we need to react quickly. Our bodies release hormones such as cortisol and adrenaline to help do this. These hormones are part of the "fight or flight" response and affect the metabolic rate, heart rate and blood pressure, resulting in a heightened, stressed state that prepares the body for optimum performance in dealing with a stressful situation. It is likely that early man required these mechanisms to stay alive, but usually modern stresses do not call for this, and the same hormones released in today's world can damage health and reduce coping ability. CAUSES  Pressure to perform at work, at school or in sports.  Threats of physical violence.  Money worries.  Arguments.  Family conflicts.  Divorce or separation from significant other.  Bereavement.  New job or unemployment.  Changes in location.  Alcohol or drug abuse. SOMETIMES, THERE IS NO PARTICULAR REASON FOR DEVELOPING STRESS. Almost all people are at risk of being stressed at some time in their lives. It is important to know that some stress is temporary and some is long term.  Temporary stress will go away when a situation is resolved. Most people can cope with short periods of stress, and it can often be relieved by relaxing, taking a walk, chatting through issues with friends, or having a good night's sleep.  Chronic (long-term, continuous) stress is much harder to deal with. It can be psychologically and emotionally damaging. It can be harmful both for an individual and  for friends and family. SYMPTOMS Everyone reacts to stress differently. There are some common effects that help [place] recognize it. In times of extreme  stress, people may:  Shake uncontrollably.  Breathe faster and deeper than normal (hyperventilate).  Vomit.  For people with asthma, stress can trigger an attack.  For some people, stress may trigger migraine headaches, ulcers, and body pain. PHYSICAL EFFECTS OF STRESS MAY INCLUDE:  Loss of energy.  Skin problems.  Aches and pains resulting from tense muscles, including neck ache, backache and tension headaches.  Increased pain from arthritis and other conditions.  Irregular heart beat (palpitations).  Periods of irritability or anger.  Apathy or depression.  Anxiety (feeling uptight or worrying).  Unusual behavior.  Loss of appetite.  Comfort eating.  Lack of concentration.  Loss of, or decreased, sex-drive.  Increased smoking, drinking, or recreational drug use.  For women, missed periods.  Ulcers, joint pain, and muscle pain. Post-traumatic stress is the stress caused by any serious accident, strong emotional damage, or extremely difficult or violent experience such as rape or war. Post-traumatic stress victims can experience mixtures of emotions such as fear, shame, depression, guilt or anger. It may include recurrent memories or images that may be haunting. These feelings can last for weeks, months or even years after the traumatic event that triggered them. Specialized treatment, possibly with medicines and psychological therapies, is available. If stress is causing physical symptoms, severe distress or making it difficult for you to function as normal, it is worth seeing your caregiver. It is important to remember that although stress is a usual part of life, extreme or prolonged stress can lead to other illnesses that will need treatment. It is better to visit a doctor sooner rather than later. Stress has been linked to the development of high blood pressure and heart disease, as well as insomnia and depression. There is no diagnostic test for stress since  everyone reacts to it differently. But a caregiver will be able to spot the physical symptoms, such as:  Headaches.  Shingles.  Ulcers. Emotional distress such as intense worry, low mood or irritability should be detected when the doctor asks pertinent questions to identify any underlying problems that might be the cause. In case there are physical reasons for the symptoms, the doctor may also want to do some tests to exclude certain conditions. If you feel that you are suffering from stress, try to identify the aspects of your life that are causing it. Sometimes you may not be able to change or avoid them, but even a small change can have a positive ripple effect. A simple lifestyle change can make all the difference. STRATEGIES THAT CAN HELP DEAL WITH STRESS:  Delegating or sharing responsibilities.  Avoiding confrontations.  Learning to be more assertive.  Regular exercise.  Avoid using alcohol or street drugs to cope.  Eating a healthy, balanced diet, rich in fruit and vegetables and proteins.  Finding humor or absurdity in stressful situations.  Never taking on more than you know you can handle comfortably.  Organizing your time better to get as much done as possible.  Talking to friends or family and sharing your thoughts and fears.  Listening to music or relaxation tapes.  Tensing and then relaxing your muscles, starting at the toes and working up to the head and neck. If you think that you would benefit from help, either in identifying the things that are causing your stress or in learning techniques to help you relax,  see a caregiver who is capable of helping you with this. Rather than relying on medications, it is usually better to try and identify the things in your life that are causing stress and try to deal with them. There are many techniques of managing stress including counseling, psychotherapy, aromatherapy, yoga, and exercise. Your caregiver can help you  determine what is best for you. Document Released: 01/02/2003 Document Revised: 01/04/2012 Document Reviewed: 11/29/2007 [facility] Patient Information 2013 [place], [place].      Neta Mends. Panosh M.D.

## 2013-02-21 ENCOUNTER — Ambulatory Visit (INDEPENDENT_AMBULATORY_CARE_PROVIDER_SITE_OTHER): Payer: Managed Care, Other (non HMO) | Admitting: Gynecology

## 2013-02-21 ENCOUNTER — Other Ambulatory Visit: Payer: Self-pay | Admitting: Gynecology

## 2013-02-21 ENCOUNTER — Ambulatory Visit: Payer: Self-pay | Admitting: Gynecology

## 2013-02-21 ENCOUNTER — Encounter: Payer: Self-pay | Admitting: Gynecology

## 2013-02-21 VITALS — BP 120/78 | Ht 66.0 in | Wt 259.0 lb

## 2013-02-21 DIAGNOSIS — N950 Postmenopausal bleeding: Secondary | ICD-10-CM

## 2013-02-21 DIAGNOSIS — N920 Excessive and frequent menstruation with regular cycle: Secondary | ICD-10-CM

## 2013-02-21 MED ORDER — DOXYCYCLINE HYCLATE 100 MG PO CAPS: 100.0000 mg | ORAL_CAPSULE | Freq: Two times a day (BID) | ORAL | Status: DC

## 2013-02-21 NOTE — Progress Notes (Signed)
57 y.o. SingleCaucasian female   No obstetric history on file. here for problem visit.  Pt reports stopped menses at 54y, prior to stopping cycles were light for a few years.  Pt began to see an integrative MD at [facility] and was started on vivelle dot and 2 progesterone at hs 1 year ago.. Pt reports last month she was increased in vivelle due to low blood level,  Pt began having heavy flow, and large gushes of blood this lasted 10d, today is day 7 of 2nd episode of bleeding.  Pt is feeling fatigued today.  Pt reports last PE done about 5y prior.  Pt reports unexplained weight loss of 30#  Patient's last menstrual period was 02/15/2013.          Sexually active: no  The current method of family planning is none.    Exercising: no  plantar faciitis Last mammogram: Last pap: 5y Last BMD:  Alcohol: Tobacco:   Health Maintenance  Topic Date Due  . Pap Smear  05/20/1972  . Tetanus/tdap  05/20/1973  . Mammogram  05/20/2004  . Colonoscopy  05/20/2004  . Influenza Vaccine  06/26/2013    Family History  Problem Relation Age of Onset  . Aneurysm Mother   . Kidney disease Mother   . Hypertension Father   . Dementia Father   . Kidney disease Brother   . Heart disease      "all of moms side"  . Heart failure Cousin   . Depression      Patient Active Problem List   Diagnosis Date Noted  . Pain of left heel 11/14/2012  . Memory difficulties 11/14/2012  . Stress at work 11/14/2012  . Plantar fasciitis of left foot 11/14/2012  . Neoplasm of uncertain behavior of skin 07/10/2011  . Infection, skin 07/10/2011  . Elevated blood pressure (not hypertension) 03/16/2011  . Family hx of aortic aneurysm 03/16/2011  . HYPERLIPIDEMIA 07/24/2008  . OBESITY, UNSPECIFIED 07/24/2008  . DYSTHYMIA, CHRONIC 07/24/2008  . PERIMENOPAUSAL SYNDROME 07/24/2008  . FATIGUE 07/24/2008  . HYPOTHYROIDISM 11/29/2007  . GLUCOMA 11/29/2007  . HEADACHE 11/29/2007    Past Medical History   Diagnosis Date  . Hypothyroid   . Glaucoma(365)   . Hyperlipemia   . Migraine   . History of syncope 2008    loss of bladder control eval by Neuro    Past Surgical History  Procedure Laterality Date  . Cataract extraction    . Glaucoma surgery      Allergies: Review of patient's allergies indicates no known allergies.  Current Outpatient Prescriptions  Medication Sig Dispense Refill  . brimonidine-timolol (COMBIGAN) 0.2-0.5 % ophthalmic solution Place 1 drop into both eyes every 12 (twelve) hours.        . dorzolamide (TRUSOPT) 2 % ophthalmic solution 1 drop 3 (three) times daily.        [name] estradiol (VIVELLE-DOT) 0.1 MG/24HR Place 1 patch onto the skin 2 (two) times a week.      . METHYLCOBALAMIN PO Take by mouth.      . Thyroid (NATURE-THROID PO) Take by mouth.      . TRAVOPROST OP Apply to eye.      . meloxicam (MOBIC) 15 MG tablet 15 mg.      . progesterone (PROMETRIUM) 100 MG capsule 100 mg.      . VIVELLE-DOT 0.1 MG/24HR 0.1 patches.       No current facility-administered medications for this visit.    ROS: Pertinent items  are noted in HPI.  Exam:    BP 120/78  Ht 5\' 6"  (1.676 m)  Wt 259 lb (117.482 kg)  BMI 41.82 kg/m2  LMP 02/15/2013 Weight change: @WEIGHTCHANGE @ Last 3 height recordings:  Ht Readings from Last 3 Encounters:  02/21/13 5\' 6"  (1.676 m)   General appearance: alert, cooperative and appears stated age Head: Normocephalic, without obvious abnormality, atraumatic Neck: no adenopathy, no carotid bruit, no JVD, supple, symmetrical, trachea midline and thyroid not enlarged, symmetric, no tenderness/mass/nodules Lungs: clear to auscultation bilaterally Breasts: deferred Heart: regular rate and rhythm, S1, S2 normal, no murmur, click, rub or gallop Abdomen: soft, non-tender; bowel sounds normal; no masses,  no organomegaly Extremities: extremities normal, atraumatic, no cyanosis or edema Skin: Skin color, texture, turgor normal. No rashes or  lesions Lymph nodes: Cervical, supraclavicular, and axillary nodes normal. no inguinal nodes palpated Neurologic: Grossly normal   Pelvic: External genitalia:  no lesions              Urethra: normal appearing urethra with no masses, tenderness or lesions              Bartholins and Skenes: normal                 Vagina: normal appearing vagina with normal color and discharge, no lesions              Cervix: prolapsing mass through cervix, nontender, hyperemic,soft              Pap taken: no        Bimanual Exam:  Uterus:  Not palpable due to habitus                                      Adnexa:    Not palpable                                      Rectovaginal: Deferred                                      Anus:  defer exam  A: Postmenopausal bleeding with associated menorrhagia   Possible prolapsing polyp vs fibroid  P: will get stat CBC, FSH, TSH, B12, iron and panel TV U/S in am Begin Doxycycline in anticpation of OR Risks of HRT reivewed will discuss plan more thoroughly after u/s. Questions addressed     An After Visit Summary was printed and given to the patient.

## 2013-02-22 ENCOUNTER — Ambulatory Visit (INDEPENDENT_AMBULATORY_CARE_PROVIDER_SITE_OTHER): Payer: Managed Care, Other (non HMO) | Admitting: Gynecology

## 2013-02-22 ENCOUNTER — Other Ambulatory Visit (INDEPENDENT_AMBULATORY_CARE_PROVIDER_SITE_OTHER): Payer: Managed Care, Other (non HMO)

## 2013-02-22 ENCOUNTER — Telehealth: Payer: Self-pay | Admitting: Gynecology

## 2013-02-22 DIAGNOSIS — N920 Excessive and frequent menstruation with regular cycle: Secondary | ICD-10-CM

## 2013-02-22 DIAGNOSIS — N950 Postmenopausal bleeding: Secondary | ICD-10-CM

## 2013-02-22 LAB — CBC
HCT: 28.5 % — ABNORMAL LOW (ref 36.0–46.0)
Hemoglobin: 9.3 g/dL — ABNORMAL LOW (ref 12.0–15.0)
MCH: 28.9 pg (ref 26.0–34.0)
MCHC: 32.6 g/dL (ref 30.0–36.0)
MCV: 88.5 fL (ref 78.0–100.0)
Platelets: 401 10*3/uL — ABNORMAL HIGH (ref 150–400)
RBC: 3.22 MIL/uL — ABNORMAL LOW (ref 3.87–5.11)
RDW: 14.5 % (ref 11.5–15.5)
WBC: 7.4 10*3/uL (ref 4.0–10.5)

## 2013-02-22 LAB — IRON AND TIBC
%SAT: 7 % — ABNORMAL LOW (ref 20–55)
Iron: 29 ug/dL — ABNORMAL LOW (ref 42–145)
TIBC: 408 ug/dL (ref 250–470)
UIBC: 379 ug/dL (ref 125–400)

## 2013-02-22 LAB — FOLLICLE STIMULATING HORMONE: FSH: 23.8 m[IU]/mL

## 2013-02-22 NOTE — Progress Notes (Signed)
        Pt here as f/u from ov yesterday.  Pt noted to have prolapsing mass polyp or fibroid noted.  Her u/s findins were reviewed.  Mass appears tho have moderate arterial and venous blood flow, it appears to come from cervix but endometrial lining also thickened.  Pt had not picked up her antibiotics last pm. Discussed removing mass in OR with hysteroscopy, procedure was outlined to pt, we will plan to obtain both uterine and cervical samples.  Risk and benefits of procedure were reviewed. Pt has stopped HRT after yesterday's ov We will assess the appropriateness of restarting after OR assessment Labs from yesterday pending as of appt, will contact  Length of visit 44m, >50% face to face discussing prolapsing polyploid mass

## 2013-02-22 NOTE — Telephone Encounter (Signed)
PT SAID DR LATHROP MENTIONED HER GETTING THE B12 SHOT AND IRON. SHE IS NOT SURE WHEN SHE  SHOULD GET THIS. 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

## 2013-02-23 ENCOUNTER — Other Ambulatory Visit: Payer: Self-pay | Admitting: Gynecology

## 2013-02-23 ENCOUNTER — Telehealth: Payer: Self-pay | Admitting: *Deleted

## 2013-02-23 ENCOUNTER — Encounter (HOSPITAL_COMMUNITY): Payer: Self-pay | Admitting: Pharmacy Technician

## 2013-02-23 DIAGNOSIS — Z0289 Encounter for other administrative examinations: Secondary | ICD-10-CM

## 2013-02-23 LAB — TSH: TSH: 0.371 u[IU]/mL (ref 0.350–4.500)

## 2013-02-23 LAB — VITAMIN B12: Vitamin B-12: >2000 pg/mL — ABNORMAL HIGH (ref 211–911)

## 2013-02-23 MED ORDER — HYDROCODONE-ACETAMINOPHEN 5-325 MG PO TABS: ORAL_TABLET | ORAL | Status: DC

## 2013-02-23 NOTE — Telephone Encounter (Signed)
1. Patient calling with request for re: pain medication post surgery. Wants to pick up rx today as she does not have a way to get it after the surgery and nobody to help her. Pharmacy is [facility] on [address] [phone]. 2. Patient needs FMLA forms completed and will bring them by today with payment for forms. Needs forms completed within 15 days.

## 2013-02-23 NOTE — Telephone Encounter (Signed)
Call received from [place] at [facility].  Patient was planning to go home via cab and states she has no one in town that can assist her.  Per hospital policy, patient must have someone with her for 24 hours and cant be discharged in cab alone.  Call to patient to explain this.  Patient states she only works here and does not have anyone to stay with her.  Explained this is for her safety, and that we can give her time to make arrangements or reschedule the procedure to a more suitable time.  Line disconnects and attemtpted to call patient back and phone went straight to VM, LM asking to know if patient wants to try to get someone to stay with her or reschedule surgery.

## 2013-02-24 ENCOUNTER — Ambulatory Visit (HOSPITAL_COMMUNITY)
Admission: RE | Admit: 2013-02-24 | Discharge: 2013-02-24 | Disposition: A | Discharge status: Home / Self Care | Payer: Managed Care, Other (non HMO) | Source: Ambulatory Visit | Attending: Gynecology | Admitting: Gynecology

## 2013-02-24 ENCOUNTER — Encounter (HOSPITAL_COMMUNITY): Payer: Self-pay | Admitting: Anesthesiology

## 2013-02-24 ENCOUNTER — Encounter (HOSPITAL_COMMUNITY)
Admission: RE | Disposition: A | Discharge status: Home / Self Care | Payer: Self-pay | Source: Ambulatory Visit | Attending: Gynecology

## 2013-02-24 ENCOUNTER — Ambulatory Visit (HOSPITAL_COMMUNITY): Payer: Managed Care, Other (non HMO) | Admitting: Anesthesiology

## 2013-02-24 DIAGNOSIS — N840 Polyp of corpus uteri: Secondary | ICD-10-CM

## 2013-02-24 DIAGNOSIS — N842 Polyp of vagina: Secondary | ICD-10-CM | POA: Insufficient documentation

## 2013-02-24 DIAGNOSIS — N950 Postmenopausal bleeding: Secondary | ICD-10-CM | POA: Insufficient documentation

## 2013-02-24 HISTORY — PX: EXAMINATION UNDER ANESTHESIA: SHX1540

## 2013-02-24 SURGERY — EXAM UNDER ANESTHESIA
Anesthesia: General | Site: Vagina | Wound class: Clean

## 2013-02-24 SURGERY — HYSTEROSCOPY
Anesthesia: General

## 2013-02-24 MED ORDER — MIDAZOLAM HCL 2 MG/2ML IJ SOLN
INTRAMUSCULAR | Status: AC
  Filled 2013-02-24: qty 2

## 2013-02-24 MED ORDER — KETOROLAC TROMETHAMINE 30 MG/ML IJ SOLN
INTRAMUSCULAR | Status: DC | PRN
  Administered 2013-02-24: 30 mg via INTRAVENOUS

## 2013-02-24 MED ORDER — PROPOFOL 10 MG/ML IV EMUL
INTRAVENOUS | Status: AC
  Filled 2013-02-24: qty 20

## 2013-02-24 MED ORDER — FENTANYL CITRATE 0.05 MG/ML IJ SOLN
INTRAMUSCULAR | Status: DC | PRN
  Administered 2013-02-24: 100 ug via INTRAVENOUS

## 2013-02-24 MED ORDER — DEXAMETHASONE SODIUM PHOSPHATE 10 MG/ML IJ SOLN
INTRAMUSCULAR | Status: AC
  Filled 2013-02-24: qty 1

## 2013-02-24 MED ORDER — PROMETHAZINE HCL 25 MG/ML IJ SOLN: 6.2500 mg | INTRAMUSCULAR | Status: DC | PRN

## 2013-02-24 MED ORDER — KETOROLAC TROMETHAMINE 30 MG/ML IJ SOLN
INTRAMUSCULAR | Status: AC
  Filled 2013-02-24: qty 1

## 2013-02-24 MED ORDER — FENTANYL CITRATE 0.05 MG/ML IJ SOLN
INTRAMUSCULAR | Status: AC
  Filled 2013-02-24: qty 2

## 2013-02-24 MED ORDER — ACETAMINOPHEN 10 MG/ML IV SOLN
INTRAVENOUS | Status: AC
  Administered 2013-02-24: 1000 mg via INTRAVENOUS
  Filled 2013-02-24: qty 100

## 2013-02-24 MED ORDER — GLYCOPYRROLATE 0.2 MG/ML IJ SOLN
INTRAMUSCULAR | Status: AC
  Filled 2013-02-24: qty 1

## 2013-02-24 MED ORDER — BUPIVACAINE-EPINEPHRINE 0.25% -1:200000 IJ SOLN
INTRAMUSCULAR | Status: DC | PRN
  Administered 2013-02-24: 10 mL

## 2013-02-24 MED ORDER — KETOROLAC TROMETHAMINE 30 MG/ML IJ SOLN: 15.0000 mg | Freq: Once | INTRAMUSCULAR | Status: DC | PRN

## 2013-02-24 MED ORDER — SUCCINYLCHOLINE CHLORIDE 20 MG/ML IJ SOLN
INTRAMUSCULAR | Status: DC | PRN
  Administered 2013-02-24: 140 mg via INTRAVENOUS

## 2013-02-24 MED ORDER — ONDANSETRON HCL 4 MG/2ML IJ SOLN
INTRAMUSCULAR | Status: AC
  Filled 2013-02-24: qty 2

## 2013-02-24 MED ORDER — LIDOCAINE HCL (CARDIAC) 20 MG/ML IV SOLN
INTRAVENOUS | Status: AC
  Filled 2013-02-24: qty 5

## 2013-02-24 MED ORDER — LACTATED RINGERS IV SOLN: INTRAVENOUS | Status: DC

## 2013-02-24 MED ORDER — EPHEDRINE 5 MG/ML INJ
INTRAVENOUS | Status: AC
  Filled 2013-02-24: qty 10

## 2013-02-24 MED ORDER — MIDAZOLAM HCL 5 MG/5ML IJ SOLN
INTRAMUSCULAR | Status: DC | PRN
  Administered 2013-02-24: 2 mg via INTRAVENOUS

## 2013-02-24 MED ORDER — PROPOFOL 10 MG/ML IV BOLUS
INTRAVENOUS | Status: DC | PRN
  Administered 2013-02-24: 200 mg via INTRAVENOUS

## 2013-02-24 MED ORDER — VASOPRESSIN 20 UNIT/ML IJ SOLN
INTRAMUSCULAR | Status: AC
  Filled 2013-02-24: qty 1

## 2013-02-24 MED ORDER — LACTATED RINGERS IV SOLN
INTRAVENOUS | Status: DC
  Administered 2013-02-24 (×3): via INTRAVENOUS

## 2013-02-24 MED ORDER — MEPERIDINE HCL 25 MG/ML IJ SOLN: 6.2500 mg | INTRAMUSCULAR | Status: DC | PRN

## 2013-02-24 MED ORDER — ONDANSETRON HCL 4 MG/2ML IJ SOLN
INTRAMUSCULAR | Status: DC | PRN
  Administered 2013-02-24: 4 mg via INTRAVENOUS

## 2013-02-24 MED ORDER — FENTANYL CITRATE 0.05 MG/ML IJ SOLN: 25.0000 ug | INTRAMUSCULAR | Status: DC | PRN

## 2013-02-24 MED ORDER — BUPIVACAINE HCL (PF) 0.25 % IJ SOLN
INTRAMUSCULAR | Status: AC
  Filled 2013-02-24: qty 30

## 2013-02-24 MED ORDER — DEXAMETHASONE SODIUM PHOSPHATE 10 MG/ML IJ SOLN
INTRAMUSCULAR | Status: DC | PRN
  Administered 2013-02-24: 10 mg via INTRAVENOUS

## 2013-02-24 MED ORDER — EPHEDRINE SULFATE 50 MG/ML IJ SOLN
INTRAMUSCULAR | Status: DC | PRN
  Administered 2013-02-24 (×2): 10 mg via INTRAVENOUS

## 2013-02-24 MED ORDER — GLYCOPYRROLATE 0.2 MG/ML IJ SOLN
INTRAMUSCULAR | Status: DC | PRN
  Administered 2013-02-24: 0.1 mg via INTRAVENOUS

## 2013-02-24 MED ORDER — LIDOCAINE HCL (CARDIAC) 20 MG/ML IV SOLN
INTRAVENOUS | Status: DC | PRN
  Administered 2013-02-24: 60 mg via INTRAVENOUS

## 2013-02-24 MED ORDER — DOXYCYCLINE HYCLATE 100 MG IV SOLR
100.0000 mg | Freq: Once | INTRAVENOUS | Status: AC
  Administered 2013-02-24: 100 mg via INTRAVENOUS
  Filled 2013-02-24: qty 100

## 2013-02-24 MED ORDER — PROMETHAZINE HCL 25 MG/ML IJ SOLN
INTRAMUSCULAR | Status: AC
  Administered 2013-02-24: 6.25 mg via INTRAVENOUS
  Filled 2013-02-24: qty 1

## 2013-02-24 SURGICAL SUPPLY — 23 items
CATH ROBINSON RED A/P 16FR (CATHETERS) ×3 IMPLANT
CONTAINER PREFILL 10% NBF 60ML (FORM) ×6 IMPLANT
DRESSING TELFA 8X3 (GAUZE/BANDAGES/DRESSINGS) ×3 IMPLANT
ELECT BALL LEEP 5MM RED (ELECTRODE) ×2 IMPLANT
ELECT LOOP LEEP RND 20X12 WHT (CUTTING LOOP) ×3
ELECT REM PT RETURN 9FT ADLT (ELECTROSURGICAL) ×3
ELECTRODE LOOP LP RND 20X12WHT (CUTTING LOOP) ×1 IMPLANT
ELECTRODE REM PT RTRN 9FT ADLT (ELECTROSURGICAL) ×1 IMPLANT
EXTENDER ELECT LOOP LEEP 10CM (CUTTING LOOP) ×2 IMPLANT
GLOVE BIOGEL M 6.5 STRL (GLOVE) ×3 IMPLANT
GLOVE BIOGEL PI IND STRL 7.0 (GLOVE) ×2 IMPLANT
GLOVE BIOGEL PI INDICATOR 7.0 (GLOVE) ×1
GOWN STRL REIN XL XLG (GOWN DISPOSABLE) ×6 IMPLANT
LOOP ANGLED CUTTING 22FR (CUTTING LOOP) IMPLANT
NDL HYPO 21X1.5 SAFETY (NEEDLE) ×1 IMPLANT
NEEDLE HYPO 21X1.5 SAFETY (NEEDLE) ×3 IMPLANT
PACK HYSTEROSCOPY LF (CUSTOM PROCEDURE TRAY) ×3 IMPLANT
PAD OB MATERNITY 4.3X12.25 (PERSONAL CARE ITEMS) ×3 IMPLANT
SUT CHROMIC 0 CT 802H (SUTURE) ×4 IMPLANT
SUT VIC AB 0 CT2 27 (SUTURE) ×2 IMPLANT
SUT VICRYL 0 ENDOLOOP (SUTURE) ×4 IMPLANT
TOWEL OR 17X24 6PK STRL BLUE (TOWEL DISPOSABLE) ×6 IMPLANT
WATER STERILE IRR 1000ML POUR (IV SOLUTION) ×3 IMPLANT

## 2013-02-24 NOTE — Interval H&P Note (Signed)
History and Physical Interval Note:  02/24/2013 11:23 AM  Kimberly Reed  has presented today for surgery, with the diagnosis of Postmenopausal Bleeding  The various methods of treatment have been discussed with the patient and family. After consideration of risks, benefits and other options for treatment, the patient has consented to  Procedure(s): HYSTEROSCOPY/POLYPECTOMY (N/A) as a surgical intervention .  The patient's history has been reviewed, patient examined, no change in status, stable for surgery.  I have reviewed the patient's chart and labs.  Questions were answered to the patient's satisfaction.     Saleem Coccia H

## 2013-02-24 NOTE — Brief Op Note (Signed)
02/24/2013  12:51 PM  PATIENT:  Kimberly Reed  57 y.o. female  PRE-OPERATIVE DIAGNOSIS:  Postmenopausal Bleeding  POST-OPERATIVE DIAGNOSIS:  postmenopausal bleeding  PROCEDURE:  Procedure(s) with comments: EXAM UNDER ANESTHESIA - with removal of prolapsing cervical mass; pap smear and endometrial biopsy  SURGEON:  Surgeon(s) and Role:    * Bennye Alm, MD - Primary  PHYSICIAN ASSISTANT:   ASSISTANTS: none   ANESTHESIA:   paracervical block and MAC  EBL:  Total I/O In: 1000 [I.V.:1000] Out: 70 [Urine:50; Blood:20]  BLOOD ADMINISTERED:none  DRAINS: none   LOCAL MEDICATIONS USED:  MARCAINE    and Amount: 10 ml  SPECIMEN:  Source of Specimen:  uterus  DISPOSITION OF SPECIMEN:  PATHOLOGY  COUNTS:  YES  TOURNIQUET:  * No tourniquets in log *  DICTATION: .Other Dictation: Dictation Number [id number]  PLAN OF CARE: Admit to inpatient   PATIENT DISPOSITION:  PACU - hemodynamically stable.   Delay start of Pharmacological VTE agent (>24hrs) due to surgical blood loss or risk of bleeding: not applicable

## 2013-02-24 NOTE — H&P (View-Only) (Signed)
56 y.o. SingleCaucasian female   No obstetric history on file. here for problem visit.  Pt reports stopped menses at 54y, prior to stopping cycles were light for a few years.  Pt began to see an integrative MD at [facility] and was started on vivelle dot and 2 progesterone at hs 1 year ago.. Pt reports last month she was increased in vivelle due to low blood level,  Pt began having heavy flow, and large gushes of blood this lasted 10d, today is day 7 of 2nd episode of bleeding.  Pt is feeling fatigued today.  Pt reports last PE done about 5y prior.  Pt reports unexplained weight loss of 30#  Patient's last menstrual period was 02/15/2013.          Sexually active: no  The current method of family planning is none.    Exercising: no  plantar faciitis Last mammogram: Last pap: 5y Last BMD:  Alcohol: Tobacco:   Health Maintenance  Topic Date Due  . Pap Smear  05/20/1972  . Tetanus/tdap  05/20/1973  . Mammogram  05/20/2004  . Colonoscopy  05/20/2004  . Influenza Vaccine  06/26/2013    Family History  Problem Relation Age of Onset  . Aneurysm Mother   . Kidney disease Mother   . Hypertension Father   . Dementia Father   . Kidney disease Brother   . Heart disease      "all of moms side"  . Heart failure Cousin   . Depression      Patient Active Problem List   Diagnosis Date Noted  . Pain of left heel 11/14/2012  . Memory difficulties 11/14/2012  . Stress at work 11/14/2012  . Plantar fasciitis of left foot 11/14/2012  . Neoplasm of uncertain behavior of skin 07/10/2011  . Infection, skin 07/10/2011  . Elevated blood pressure (not hypertension) 03/16/2011  . Family hx of aortic aneurysm 03/16/2011  . HYPERLIPIDEMIA 07/24/2008  . OBESITY, UNSPECIFIED 07/24/2008  . DYSTHYMIA, CHRONIC 07/24/2008  . PERIMENOPAUSAL SYNDROME 07/24/2008  . FATIGUE 07/24/2008  . HYPOTHYROIDISM 11/29/2007  . GLUCOMA 11/29/2007  . HEADACHE 11/29/2007    Past Medical History   Diagnosis Date  . Hypothyroid   . Glaucoma(365)   . Hyperlipemia   . Migraine   . History of syncope 2008    loss of bladder control eval by Neuro    Past Surgical History  Procedure Laterality Date  . Cataract extraction    . Glaucoma surgery      Allergies: Review of patient's allergies indicates no known allergies.  Current Outpatient Prescriptions  Medication Sig Dispense Refill  . brimonidine-timolol (COMBIGAN) 0.2-0.5 % ophthalmic solution Place 1 drop into both eyes every 12 (twelve) hours.        . dorzolamide (TRUSOPT) 2 % ophthalmic solution 1 drop 3 (three) times daily.        . estradiol (VIVELLE-DOT) 0.1 MG/24HR Place 1 patch onto the skin 2 (two) times a week.      . METHYLCOBALAMIN PO Take by mouth.      . Thyroid (NATURE-THROID PO) Take by mouth.      . TRAVOPROST OP Apply to eye.      . meloxicam (MOBIC) 15 MG tablet 15 mg.      . progesterone (PROMETRIUM) 100 MG capsule 100 mg.      . VIVELLE-DOT 0.1 MG/24HR 0.1 patches.       No current facility-administered medications for this visit.    ROS: Pertinent items   are noted in HPI.  Exam:    BP 120/78  Ht 5' 6" (1.676 m)  Wt 259 lb (117.482 kg)  BMI 41.82 kg/m2  LMP 02/15/2013 Weight change: @WEIGHTCHANGE@ Last 3 height recordings:  Ht Readings from Last 3 Encounters:  02/21/13 5' 6" (1.676 m)   General appearance: alert, cooperative and appears stated age Head: Normocephalic, without obvious abnormality, atraumatic Neck: no adenopathy, no carotid bruit, no JVD, supple, symmetrical, trachea midline and thyroid not enlarged, symmetric, no tenderness/mass/nodules Lungs: clear to auscultation bilaterally Breasts: deferred Heart: regular rate and rhythm, S1, S2 normal, no murmur, click, rub or gallop Abdomen: soft, non-tender; bowel sounds normal; no masses,  no organomegaly Extremities: extremities normal, atraumatic, no cyanosis or edema Skin: Skin color, texture, turgor normal. No rashes or  lesions Lymph nodes: Cervical, supraclavicular, and axillary nodes normal. no inguinal nodes palpated Neurologic: Grossly normal   Pelvic: External genitalia:  no lesions              Urethra: normal appearing urethra with no masses, tenderness or lesions              Bartholins and Skenes: normal                 Vagina: normal appearing vagina with normal color and discharge, no lesions              Cervix: prolapsing mass through cervix, nontender, hyperemic,soft              Pap taken: no        Bimanual Exam:  Uterus:  Not palpable due to habitus                                      Adnexa:    Not palpable                                      Rectovaginal: Deferred                                      Anus:  defer exam  A: Postmenopausal bleeding with associated menorrhagia   Possible prolapsing polyp vs fibroid  P: will get stat CBC, FSH, TSH, B12, iron and panel TV U/S in am Begin Doxycycline in anticpation of OR Risks of HRT reivewed will discuss plan more thoroughly after u/s. Questions addressed     An After Visit Summary was printed and given to the patient.    

## 2013-02-24 NOTE — Transfer of Care (Signed)
Immediate Anesthesia Transfer of Care Note  Patient: Kimberly Reed  Procedure(s) Performed: Procedure(s) with comments: EXAM UNDER ANESTHESIA - with removal of prolapsing cervical mass; pap smear and endometrial biopsy  Patient Location: PACU  Anesthesia Type:General  Level of Consciousness: sedated  Airway & Oxygen Therapy: Patient Spontanous Breathing and Patient connected to nasal cannula oxygen  Post-op Assessment: Report given to PACU RN and Post -op Vital signs reviewed and stable  Post vital signs: stable  Complications: No apparent anesthesia complications

## 2013-02-24 NOTE — Telephone Encounter (Signed)
Routed to Dr. Lathrop  

## 2013-02-24 NOTE — Anesthesia Postprocedure Evaluation (Signed)
  Anesthesia Post-op Note  Anesthesia Post Note  Patient: Kimberly Reed  Procedure(s) Performed: Procedure(s): EXAM UNDER ANESTHESIA  Anesthesia type: General  Patient location: PACU  Post pain: Pain level controlled  Post assessment: Post-op Vital signs reviewed  Last Vitals:  Filed Vitals:   02/24/13 1430  BP: 120/62  Pulse: 67  Temp: 36.5 C  Resp: 0    Post vital signs: Reviewed  Level of consciousness: sedated  Complications: No apparent anesthesia complications

## 2013-02-24 NOTE — Anesthesia Preprocedure Evaluation (Signed)
Anesthesia Evaluation  Patient identified by MRN, date of birth, ID band Patient awake    Reviewed: Allergy & Precautions, H&P , NPO status , Patient's Chart, lab work & pertinent test results  Airway Mallampati: III TM Distance: >3 FB Neck ROM: full    Dental no notable dental hx. (+) Teeth Intact   Pulmonary neg pulmonary ROS,    Pulmonary exam normal       Cardiovascular negative cardio ROS      Neuro/Psych negative psych ROS   GI/Hepatic negative GI ROS, Neg liver ROS,   Endo/Other  Morbid obesity  Renal/GU negative Renal ROS  negative genitourinary   Musculoskeletal   Abdominal (+) + obese,   Peds  Hematology negative hematology ROS (+)   Anesthesia Other Findings   Reproductive/Obstetrics negative OB ROS                           Anesthesia Physical Anesthesia Plan  ASA: III  Anesthesia Plan: General   Post-op Pain Management:    Induction: Intravenous  Airway Management Planned: LMA  Additional Equipment:   Intra-op Plan:   Post-operative Plan:   Informed Consent: I have reviewed the patients History and Physical, chart, labs and discussed the procedure including the risks, benefits and alternatives for the proposed anesthesia with the patient or authorized representative who has indicated his/her understanding and acceptance.     Plan Discussed with: CRNA and Surgeon  Anesthesia Plan Comments:         Anesthesia Quick Evaluation

## 2013-02-25 NOTE — Op Note (Signed)
NAMECHARONDA, Kimberly Reed               ACCOUNT NO.:  [id number]  MEDICAL RECORD NO.:  [id number]  LOCATION:  [facility]                          FACILITY:  [facility]  PHYSICIAN:  Ivor Costa. Farrel Gobble, M.D. DATE OF BIRTH:  May 17, 1956  DATE OF PROCEDURE:  02/24/2013 DATE OF DISCHARGE:  02/24/2013                              OPERATIVE REPORT   PREOPERATIVE DIAGNOSES: 1. Postmenopausal bleeding. 2. Prolapsing uterine mass.  POSTOPERATIVE DIAGNOSES: 1. Postmenopausal bleeding. 2. Prolapsing uterine mass.  PROCEDURE:  An exam under anesthesia, a polypectomy, an endometrial biopsy, and a Pap smear.  SURGEON:  Ivor Costa. Farrel Gobble, M.D.  ANESTHESIA:  MAC with a paracervical block.  ESTIMATED BLOOD LOSS:  20.  IV FLUIDS:  1 L of lactated Ringer's.  FINDINGS:  On speculum exam, there was approximately 4 cm fungating prolapsing mass to the cervix that was necrotic.  The cervix was grossly dilated on the external os internally, however, not.  The uterus sounded to 7.  Bimanual exam was limited by habitus.  PATHOLOGY:  Cervical specimen, Pap smear, and endometrial biopsy.  COMPLICATIONS:  None.  DESCRIPTION OF PROCEDURE:  The patient was taken to the operating room, general anesthesia was induced, placed in dorsal lithotomy position, prepped and draped in usual sterile fashion.  Sterile weighted speculum was placed in the vagina.  Sims was placed anteriorly.  The vagina was noted to be markedly distorted base from the sterile weighted speculum, however, fungating mass was able to be visualized.  Single-tooth tenaculum was placed anteriorly; however, based on the aperture of the vagina, minimal mobility was appreciated.  The sterile weighted speculum was removed.  Bivalve speculum was placed.  The cervix was able to be visualized around this mass as described above.  Gentle traction on the mass did not reveal any further lengthening or stalk visualization.  Two Endoloops were then passed up into the  canal of 0-Vicryl and cinched down.  The mass was then dissected off with loop cautery set at 40 set on coag.  The 2 Endoloops, however, slipped off shortly thereafter. There was a small amount of bleeding from the endocervix.  The posterior aspect of the cervix appeared to be a little necrotic in appearance. Two stay sutures were placed at 3 and 9 o'clock with 0 chromic and was cinched down for cervical hemostasis.  A Pap smear was then performed. An endometrial pipelle was advanced through the cervix.  The uterus sounded to 7, minimal amount of tissue was obtained on 2 passes.  Re- inspection of the cervix showed hemostasis; however, there was still necrotic tissue at the edge of the cervix that was not easily removable. Decision was made to discontinue the procedure at this point as the patient was hemodynamically stable and an adequate biopsy for further evaluation was obtained. As the cervix could now be fully visualized, a paracervical block with 10cc 0.25% marcaine was placed for post-operative comfort The patient tolerated the procedure well. Sponge, lap, and needle counts were correct x2.  She was given doxycycline IV as well as was started on it preoperatively and will continue it postoperatively.     Ivor Costa. Farrel Gobble, M.D.     THL/MEDQ  D:  02/24/2013  T:  02/25/2013  Job:  [id number]

## 2013-02-27 ENCOUNTER — Encounter: Payer: Self-pay | Admitting: *Deleted

## 2013-02-27 ENCOUNTER — Ambulatory Visit (INDEPENDENT_AMBULATORY_CARE_PROVIDER_SITE_OTHER): Payer: Managed Care, Other (non HMO) | Admitting: Gynecology

## 2013-02-27 ENCOUNTER — Encounter (HOSPITAL_COMMUNITY): Payer: Self-pay | Admitting: Gynecology

## 2013-02-27 ENCOUNTER — Telehealth: Payer: Self-pay | Admitting: *Deleted

## 2013-02-27 VITALS — BP 120/80 | HR 68 | Resp 16

## 2013-02-27 DIAGNOSIS — N951 Menopausal and female climacteric states: Secondary | ICD-10-CM

## 2013-02-27 DIAGNOSIS — F329 Major depressive disorder, single episode, unspecified: Secondary | ICD-10-CM

## 2013-02-27 DIAGNOSIS — R51 Headache: Secondary | ICD-10-CM

## 2013-02-27 DIAGNOSIS — F32A Depression, unspecified: Secondary | ICD-10-CM

## 2013-02-27 MED ORDER — VENLAFAXINE HCL ER 37.5 MG PO CP24: 37.5000 mg | ORAL_CAPSULE | Freq: Every day | ORAL | Status: DC

## 2013-02-27 MED ORDER — PROPRANOLOL HCL 10 MG PO TABS: 10.0000 mg | ORAL_TABLET | Freq: Three times a day (TID) | ORAL | Status: DC

## 2013-02-27 NOTE — Telephone Encounter (Signed)
Patient calling re: FMLA forms and dates question. Needs to clarify how long she will be out of work. Patient also needs a letter from MD excusing her from work. Patient was not feeling well enough to return to work today. Please advise.

## 2013-02-27 NOTE — Telephone Encounter (Signed)
Patient called in for update on FMLA papers.  Hysteroscopy/polyp resect done on 02-24-13.  Advised patient that FMLA forms are not done for short term absence and for her type of surgery she should be able to return to work today.She reports she does not feel 100% and she "feels like she is going to faint" and needs FMLA per her employer.  Patient then states she will try to return to work tomm but she is sensitive to medication and cant go to work unless she is 100%.  Then also states she needs forms out of work from last Wednesday as she wasn't feeling well.  Explained if she is still feeling that badly she needs OV for eval since her procedure should not cause her to still feel this sick. Patient  Then states she feels queezy and has headache but not actually going to faint. Patient upset and wants to come right now to get dr Farrel Gobble to write note out of work.  Advised that she can come for appointment with Dr Farrel Gobble to eval.Appt this pm at 345.

## 2013-02-27 NOTE — Progress Notes (Signed)
Pt 3d s/p EUA with MAC for necrotic prolapsing polyp.  Because of her acute menorrhagia , pt was acutely taken off estrogen patch, prometrium and pregnenolone.  Pt reports she has not felt right in the head since.  She questions if it could be related to either the change in medications or anesthesia..  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 Pt has a level of depression but has never been able to tolerate antidepressants. Pt denies night sweats but reports feeling hot. Pt denies any vaginal sx-bleeding or pain.  Pt refused PE. In general has depressed affect, sleepy  We discussed the importance of a full evaluation prior to considering restarting her rx's, and suggest nonhormonal  Options that she can try.   Discussed trying inderal for headache and effexor 37.5mg  for sx of menopause. We offered her a note for work that indicates her recent evaluations.  Path back during ov-benign inflammed endocervical type polyp, emb-benign proliferative pattern endometrium- no atypia Pt relieved, reminded to con't abx, iron

## 2013-03-03 DIAGNOSIS — M2550 Pain in unspecified joint: Secondary | ICD-10-CM

## 2013-03-03 DIAGNOSIS — M545 Low back pain, unspecified: Secondary | ICD-10-CM

## 2013-03-03 DIAGNOSIS — M2560 Stiffness of unspecified joint, not elsewhere classified: Secondary | ICD-10-CM

## 2013-03-03 DIAGNOSIS — M6281 Muscle weakness (generalized): Secondary | ICD-10-CM

## 2013-03-13 ENCOUNTER — Ambulatory Visit: Payer: Self-pay | Admitting: Gynecology

## 2013-03-27 ENCOUNTER — Ambulatory Visit: Payer: Self-pay | Admitting: Gynecology

## 2013-03-27 ENCOUNTER — Ambulatory Visit (INDEPENDENT_AMBULATORY_CARE_PROVIDER_SITE_OTHER): Payer: Managed Care, Other (non HMO) | Admitting: Gynecology

## 2013-03-27 VITALS — BP 140/80 | Resp 12 | Wt 251.0 lb

## 2013-03-27 DIAGNOSIS — E039 Hypothyroidism, unspecified: Secondary | ICD-10-CM

## 2013-03-27 DIAGNOSIS — Z124 Encounter for screening for malignant neoplasm of cervix: Secondary | ICD-10-CM

## 2013-03-27 NOTE — Progress Notes (Signed)
Subjective:     Patient ID: Kimberly Reed, female   DOB: [birth year], 57 y.o.   MRN: [id number]  HPI Comments: Pt here for post-op, when we had seen her last we started her on Inderal for headache, and effexor but she stopped both due to side effects.  Pt restarted herself on the pregnenolone but has not restarted the estrogen patch, B12, and progesterone.  Pt feels more like herself and is back at work but has a general low energy level.  Pt is moving to a less stressful job.. No more bleeding since surgery.      Review of Systems Per HPI    Objective:   Physical Exam  Constitutional: She appears well-developed and well-nourished.  Abdominal: Soft. Normal appearance.  Genitourinary: Vagina normal. There is no rash or tenderness on the right labia. There is no rash or tenderness on the left labia. Cervix exhibits no motion tenderness, no discharge and no friability. Right adnexum displays no mass and no tenderness. Left adnexum displays no mass and no tenderness.  Psychiatric: Judgment and thought content normal. Her mood appears not anxious. Her affect is blunt. Her affect is not labile. She is withdrawn. She exhibits a depressed mood.  cervix well healled, no lesions, uterus difficult to palpate due to habitus     Assessment:     Normal post-operative exam Need for PAP fatigue     Plan:     PAP wit HRHPV done Discussed not restarting HRT but recommend checking full TFT's as she is on a compounded thyroid product from previous MD, baseline cognitive state unable to assess To call for any bleeding, will reassess status after job change noted above

## 2013-03-28 ENCOUNTER — Telehealth: Payer: Self-pay | Admitting: *Deleted

## 2013-03-28 LAB — THYROID PANEL WITH TSH
Free Thyroxine Index: 3 (ref 1.0–3.9)
T3 Uptake: 36.6 % (ref 22.5–37.0)
T4, Total: 8.1 ug/dL (ref 5.0–12.5)
TSH: 0.111 u[IU]/mL — ABNORMAL LOW (ref 0.350–4.500)

## 2013-03-28 MED ORDER — THYROID 81.25 MG PO TABS: 81.2500 mg | ORAL_TABLET | Freq: Every day | ORAL | Status: DC

## 2013-03-28 NOTE — Addendum Note (Signed)
Addended by: Lorraine Lax on: 03/28/2013 03:44 PM   Modules accepted: Orders

## 2013-03-28 NOTE — Telephone Encounter (Signed)
Left Message To Call Back Re: Regarding Lab Results Dr. Farrel Gobble wants to decrease of thyroid to 81.25 #30/1 rf's they carry nature thyroid at [facility]. Dr. Farrel Gobble also wants to recheck her thyroid in 6 weeks  (need to send rx to [facility] if patient agrees)

## 2013-03-28 NOTE — Telephone Encounter (Signed)
Message copied by Lorraine Lax on Tue Mar 28, 2013  2:32 PM ------      Message from: Douglass Rivers      Created: Tue Mar 28, 2013  8:15 AM       Pt is over stimulated on thyroid, which can be exhausting, bad for bones, will make her feel hot, irritable, we need to decrease dose, will need to contact [facility] to see if they have this thyroid or will change to armour thyroid ------

## 2013-03-29 NOTE — Telephone Encounter (Signed)
S/w pt who was notified (see lab)

## 2013-03-30 LAB — IPS PAP TEST WITH HPV

## 2013-03-31 ENCOUNTER — Telehealth: Payer: Self-pay | Admitting: Gynecology

## 2013-03-31 NOTE — Telephone Encounter (Signed)
Naturoid rx was written in wrong dosage per patient due to a misunderstanding. Dosage should be doubled per patient to two a day. Please advise?

## 2013-03-31 NOTE — Telephone Encounter (Signed)
Spoke with pt about thyroid dosage. Pt states she has been taking 2 of the 81 mg tablets a day, and was told her thyroid was still too low. Pt now started on Nature Thyroid 97.5 mg, but was only given 30 tablets. Pt was thinking of taking the 81 mg first thing in the morning, and taking the 97.5 mg tab in the afternoon. Right now pt has 30 of each. Pt also needs to clarify when to come back for a recheck of her thyroid level. Is it 6 weeks from start of change? Please clarify how pt should take her meds.

## 2013-04-05 NOTE — Telephone Encounter (Signed)
Please call pharmacy re natur thyroid component so we can appropriately adjust the dose, and let pt know we are doing so

## 2013-04-10 ENCOUNTER — Telehealth: Payer: Self-pay | Admitting: *Deleted

## 2013-04-10 NOTE — Telephone Encounter (Signed)
Call to [facility], per [place], she will fax patient's dosing history and info on this medication.

## 2013-04-10 NOTE — Telephone Encounter (Signed)
Information from pharmacy on your desk from [facility]. Patient aware of this and is upset that we do not seem to get this medication for her corrected. Please advise/  sue

## 2013-04-11 NOTE — Telephone Encounter (Signed)
Patient called again this morning to see if issue of her medication has been seen to. States that if we can not get this right then she will have to call her primary doctor to manage this. States she has been on 2 pills a day and only one pill a day was ordered . Patient phone disconnected for any more response. Please help with this. Patient is very angry and upset at phone nurse when she calls.states that this has been going on too long to get corrected and if we can not get this corrected she will have to go to her primary doctor.?

## 2013-04-12 NOTE — Telephone Encounter (Signed)
Please have her take 1 pill in am and 1/2 in pm, recheck tsh in 6w

## 2013-04-13 NOTE — Telephone Encounter (Signed)
LMTCB  aa     (Need to tell her, per TL, pt to take 1 Naturethroid in the am, and half a pill in the pm. 97.5 mg tabs are round and scored, so pt can break in half. Recheck TSH in 6 weeks.)

## 2013-04-13 NOTE — Telephone Encounter (Signed)
Spoke with pt about TL recommendation for Nature thyroid. Pt states she will not have enough pills to get her through the 6 weeks, and pt was hoping to use the remainder of her 81 mg tabs. Offered to call in more of the nature thyroid for her so she will have enough. Pt was thinking she could take 2 and a quarter pills of the 81 mg tab. Pt is frustrated, thinks she will just go back to her PCP for this. Pt stated that her work break was over and she had to hang up. Any other advice?

## 2013-04-14 NOTE — Telephone Encounter (Signed)
Pt called back to apologize for having to hang up suddenly. Pt calling to request 60 tablets of the 81 mg armour thyroid. Pt wondering if TL would prescribe this. Please advise.

## 2013-04-19 ENCOUNTER — Telehealth: Payer: Self-pay | Admitting: Gynecology

## 2013-04-19 NOTE — Telephone Encounter (Signed)
She is over stimulated and the dose she suggested is too much, keep at 1/2 tab in pm

## 2013-04-19 NOTE — Telephone Encounter (Signed)
Pt had called this am and told receptionist that she expected to hear back from Dr. Farrel Gobble by tomorrow morning re: her thyroid meds or she would see her PCP to manage. Pt has been advised by TL on dosing previously and has not been in agreement with TL recommendation. LM on pt's VM as requested that TL is in surgery all day today and also in the am and will not be able to get back with her quickly. Advised it might be best for pt to consult with PCP for management. Pt to call back if necessary.

## 2013-04-19 NOTE — Telephone Encounter (Signed)
Patient called about her Synthroid .  She would like the dosage to be 97.5 mg and 81.0 mg  # 60  x 2 qd .

## 2013-04-20 NOTE — Telephone Encounter (Signed)
Spoke with pt about thyroid med. Pt reports she has been taking 1 naturethroid 97.5 mg in the am and 1 armour thyroid 81 mg in the pm for about 2 weeks now. She says she "feels good" on this dose and feels like "it is bringing the level down" without putting her too low. Pt would like me to remind Dr. Elbert Ewings that when the last bloodwork was drawn here, she was taking 2 naturethroid a day. She really does not want to have to go back to her PCP because her last bloodwork was done here, and she can't afford to have it done again at her PCP. Pt also reports she had to go back on her patch because of the constant sweats she was having. Pt says Dr. Elbert Ewings did not want her to do this, but she had to. Started back a few days ago and the sweats are gone now. Please advise.

## 2013-04-20 NOTE — Telephone Encounter (Signed)
LM on pt's VM re: TL dosage recommendation for Naturethroid 97.5 mg 1 in am and half a pill in pm. Advised we would be glad to call in more tabs to her pharmacy. Advised pt to call back if she is in agreement.

## 2013-04-25 NOTE — Telephone Encounter (Signed)
Dr lathrop, have you talked with this patient? Is there anything we need to complete for her?

## 2013-04-26 NOTE — Telephone Encounter (Signed)
Patient calling expressing frustration about getting RX for Synthroid: states she needs 60 tablets total: 30 tablets-97 mg. and 30-81mg . [employer]. Patient says she will be out of the medicine early next week. Patient aware Dr. Farrel Gobble is on vacation this week and stated she would call back then.

## 2013-05-01 NOTE — Telephone Encounter (Signed)
Pt calling regarding her precsription. Says she is about run out of the medication.

## 2013-05-02 ENCOUNTER — Telehealth: Payer: Self-pay | Admitting: *Deleted

## 2013-05-02 MED ORDER — THYROID 97.5 MG PO TABS: 1.0000 | ORAL_TABLET | Freq: Every day | ORAL | Status: DC

## 2013-05-02 NOTE — Telephone Encounter (Signed)
Message copied by Alisa Graff on Tue May 02, 2013 11:22 AM ------      Message from: Vania Rea      Created: Tue May 02, 2013 10:32 AM      Regarding: Medication refill       Ms Hodges called and demanded to speak to Amy. When I explained that Amy was not working today, the patient expressed her annoyance and said for me to put someone else on the phone. After speaking to Sherril Cong, I went back to continue the conversation with the patient. She expects a medication, Shelda Pal called in to her pharmacy [facility]) today. She said this has already been told to Amy and did not feel like she should discuss all of it with me.            Her next break will be at 3:30 and she will be calling back at that time.       ------

## 2013-05-02 NOTE — Telephone Encounter (Signed)
See additional phone note from this patient on 05-02-13.

## 2013-05-02 NOTE — Telephone Encounter (Signed)
Please advise.  I know you and Amy were working on this for the patient.  I thought she was to get this from PCP.

## 2013-05-02 NOTE — Telephone Encounter (Signed)
Nature Thyroid 97.5 #60/0rfs sent through to [facility]

## 2013-05-02 NOTE — Telephone Encounter (Signed)
Patient called this am and again at 3pm regarding refill request for both doses of Nature Thyroid.  Per Dr Farrel Gobble, pat is to only take 1 po of 97.5 mg in am and 1/2 of 97.5 mg in pm.  She is NOT to take the 81 mg in the evening as Dr Farrel Gobble says this dose is to high.  RX sent for 60 tabs of 97.5 to last for 6 weeks and then needs labwork retested.  Left detailed message on her VM and RX sent By Dr Lathrop's CMA Leavy Cella, per Dr Farrel Gobble instruction.

## 2013-05-02 NOTE — Telephone Encounter (Signed)
Per Dr. Farrel Gobble, voice message left by Maralyn Sago Kennon Rounds) [facility] for patient stating we would give her 60 pills of 97.5 Nature Thyroid.  She is to take one pill in the morning and a half pill in the afternoon.  Dr. Farrel Gobble does not want her to take the 81.25.  Also, she wants to check her bloodwork in 6 weeks.  Please call into [facility].

## 2013-05-03 ENCOUNTER — Telehealth: Payer: Self-pay | Admitting: *Deleted

## 2013-05-03 NOTE — Telephone Encounter (Signed)
See next phone note regarding refill.

## 2013-05-03 NOTE — Telephone Encounter (Signed)
Message copied by Alisa Graff on Wed May 03, 2013 11:21 AM ------      Message from: Vania Rea      Created: Tue May 02, 2013 10:32 AM      Regarding: Medication refill       Ms Puerta called and demanded to speak to Amy. When I explained that Amy was not working today, the patient expressed her annoyance and said for me to put someone else on the phone. After speaking to Sherril Cong, I went back to continue the conversation with the patient. She expects a medication, Shelda Pal called in to her pharmacy [facility]) today. She said this has already been told to Amy and did not feel like she should discuss all of it with me.            Her next break will be at 3:30 and she will be calling back at that time.       ------

## 2013-05-04 ENCOUNTER — Other Ambulatory Visit: Payer: Self-pay | Admitting: Gynecology

## 2013-05-04 MED ORDER — THYROID 97.5 MG PO TABS: ORAL_TABLET | ORAL | Status: DC

## 2013-05-04 NOTE — Telephone Encounter (Signed)
See next phone messages. 

## 2013-05-04 NOTE — Progress Notes (Signed)
Correction to previously rx sent through [facility]-Throid 97.5mg  1/2 pill in the morning and a half pill in the afternoon sent through to [facility].

## 2013-05-11 ENCOUNTER — Other Ambulatory Visit: Payer: Managed Care, Other (non HMO)

## 2013-05-15 ENCOUNTER — Encounter: Payer: Self-pay | Admitting: Gynecology

## 2013-05-15 ENCOUNTER — Ambulatory Visit (INDEPENDENT_AMBULATORY_CARE_PROVIDER_SITE_OTHER): Payer: Managed Care, Other (non HMO) | Admitting: Gynecology

## 2013-05-15 VITALS — BP 126/84 | HR 76 | Ht 65.0 in | Wt 250.0 lb

## 2013-05-15 DIAGNOSIS — E039 Hypothyroidism, unspecified: Secondary | ICD-10-CM

## 2013-05-15 DIAGNOSIS — N951 Menopausal and female climacteric states: Secondary | ICD-10-CM

## 2013-05-15 MED ORDER — ESTRADIOL 0.0375 MG/24HR TD PTTW: 1.0000 | MEDICATED_PATCH | TRANSDERMAL | Status: DC

## 2013-05-15 MED ORDER — PROGESTERONE MICRONIZED 100 MG PO CAPS: 100.0000 mg | ORAL_CAPSULE | Freq: Every day | ORAL | Status: DC

## 2013-05-15 MED ORDER — ESTRADIOL 0.0375 MG/24HR TD PTTW: MEDICATED_PATCH | TRANSDERMAL | Status: DC

## 2013-05-15 NOTE — Progress Notes (Signed)
Pt is here to discuss her HRT and thyroid stimulation.  Pt restarted her patch at 1/2 for about 3w, pt states that she was having mostly night sweats that were disturbing sleep and hot flashes during the day, pt feels much better on 0.5mg  (rx is for 0..1) but she has not been taking progestin.  We discussed the risks of being hyperthyroidism, she is currently overstimulated and we discussed the risk of CVD and osteoporosis.  Pt is happy that she had lost 40# but we have stressed that this is not the ideal way to lose weight.  Pt recently joined a gym and goes 3x/week.  Pt is encouraged by her progress.  Pt is doing 74m on bike.  Discussed ideal exercise regimen, discussed importance of weight loss, compliance with medication. Pt is agreeable to change to armour thyroid from her natur-thyroid  We will change her to  minivelle 0.375 and prometrium.  ACOG's statement regarding hrt and bio-identical hormones reviewed Pt will call if needs adjustment on estrogen dose  Length of time 95m discussing HRT

## 2013-05-18 ENCOUNTER — Telehealth: Payer: Self-pay | Admitting: *Deleted

## 2013-05-18 NOTE — Telephone Encounter (Signed)
Per Dr. Farrel Gobble  Please let pt know that I spoke to pharmacist, her dose of 91.75 BID equals armour thyroid 180, I'd like to start her at that dose for only 2w to make sure she tolerates it, be aware that is too high a dose for her, then I will drop her down to 150= 90 and 60mg  to take together and we will recheck TSH after 6w on lower dose, please inform pt and we can call in, #14 on higher dose and #45 of the 60, 90

## 2013-05-18 NOTE — Telephone Encounter (Signed)
Left Message To Call Back  

## 2013-05-22 NOTE — Telephone Encounter (Signed)
Tried to LM, mailbox is full and cannot accept new ones.

## 2013-05-25 MED ORDER — THYROID 180 MG PO TABS: 180.0000 mg | ORAL_TABLET | Freq: Every day | ORAL | Status: DC

## 2013-05-25 MED ORDER — THYROID 60 MG PO TABS: 60.0000 mg | ORAL_TABLET | Freq: Every day | ORAL | Status: DC

## 2013-05-25 MED ORDER — THYROID 90 MG PO TABS: 90.0000 mg | ORAL_TABLET | Freq: Every day | ORAL | Status: DC

## 2013-05-25 NOTE — Telephone Encounter (Signed)
S/w pt she is aware to take the 180 mg (equal to her dose of 91.75) for 2 weeks, and then take the 90/60 mg (equal to 150 mg) for 6 weeks after being on that does for 6w is aware to schedule tsh recheck (which she will call back and schedule.  Armour thyroid 180 mg #14/0rfs Armour thyroid 90 mg #45/0rfs Armour Thyroid 60 mg #45/0rfs  Sent to [facility] pt is aware.

## 2013-07-26 ENCOUNTER — Telehealth: Payer: Self-pay | Admitting: Gynecology

## 2013-07-26 DIAGNOSIS — E039 Hypothyroidism, unspecified: Secondary | ICD-10-CM

## 2013-07-26 NOTE — Telephone Encounter (Signed)
Pt would like to have a follow up lab appointment because of a medication Dr lathrop put her on.

## 2013-07-26 NOTE — Telephone Encounter (Signed)
Patient calling for recheck of Tsh. Okay to order? Was due early August.   Message left to return call to [place] at [phone].

## 2013-07-27 ENCOUNTER — Other Ambulatory Visit: Payer: Self-pay | Admitting: Gynecology

## 2013-07-27 DIAGNOSIS — E039 Hypothyroidism, unspecified: Secondary | ICD-10-CM

## 2013-07-27 NOTE — Telephone Encounter (Signed)
Patient returned Tracy's call and does not have another number to be reached at besides the "full mailbox" number. Patient will call back today after 3 PM.

## 2013-07-27 NOTE — Telephone Encounter (Signed)
Epic said there is an order in already, i think pt just needs to come in

## 2013-07-27 NOTE — Telephone Encounter (Signed)
Attempted another call. ""the mailbox is full and cannot accept any new messages at this time. goodbye"

## 2013-07-28 NOTE — Telephone Encounter (Signed)
Spoke with patient. Lab appointment already scheduled. Will come in for tsh check.

## 2013-08-02 ENCOUNTER — Other Ambulatory Visit: Payer: Managed Care, Other (non HMO)

## 2013-08-03 ENCOUNTER — Ambulatory Visit (INDEPENDENT_AMBULATORY_CARE_PROVIDER_SITE_OTHER): Payer: Managed Care, Other (non HMO) | Admitting: Gynecology

## 2013-08-03 DIAGNOSIS — E039 Hypothyroidism, unspecified: Secondary | ICD-10-CM

## 2013-08-04 ENCOUNTER — Telehealth: Payer: Self-pay | Admitting: *Deleted

## 2013-08-04 LAB — TSH: TSH: 0.095 u[IU]/mL — ABNORMAL LOW (ref 0.350–4.500)

## 2013-08-04 MED ORDER — THYROID 120 MG PO TABS: 120.0000 mg | ORAL_TABLET | Freq: Every day | ORAL | Status: DC

## 2013-08-04 NOTE — Telephone Encounter (Signed)
Tried to call patient "VM box is full and is not accepting any new calls"

## 2013-08-04 NOTE — Telephone Encounter (Signed)
Patient notified see labs 

## 2013-08-04 NOTE — Telephone Encounter (Signed)
Message copied by Lorraine Lax on Fri Aug 04, 2013  8:35 AM ------      Message from: Douglass Rivers      Created: Fri Aug 04, 2013  7:32 AM       Still over stimulated, even more so, please find out what she is actually taking ------

## 2013-08-04 NOTE — Telephone Encounter (Signed)
Pt returning your call

## 2013-08-04 NOTE — Progress Notes (Signed)
Amour Thyroid 120 mg #60/0rf's sent through to [facility]. (see labs)

## 2013-08-07 ENCOUNTER — Other Ambulatory Visit: Payer: Self-pay | Admitting: Gynecology

## 2013-08-07 DIAGNOSIS — E039 Hypothyroidism, unspecified: Secondary | ICD-10-CM

## 2013-09-13 ENCOUNTER — Telehealth: Payer: Self-pay | Admitting: Gynecology

## 2013-09-13 NOTE — Telephone Encounter (Signed)
Pt is requesting refill on her thyroid medication. She has 1 pill left and states she has to take this for 6 weeks. Pharmacy  [facility] pt can only be reached after 4:30

## 2013-09-14 NOTE — Telephone Encounter (Signed)
08/04/13 #60/0rfs was sent to [facility] patient should still have refills S/w pharmacist at [facility] patient picked up rx yesterday Called patient left VM reminding patient to schedule 6 week recheck.

## 2013-09-28 ENCOUNTER — Other Ambulatory Visit: Payer: Managed Care, Other (non HMO)

## 2013-09-28 ENCOUNTER — Telehealth: Payer: Self-pay | Admitting: Gynecology

## 2013-09-28 NOTE — Telephone Encounter (Signed)
Spoke to patient. She is due for TSH check. Patient requests appointment for today and it was scheduled.

## 2013-09-28 NOTE — Telephone Encounter (Signed)
Patient is stating she needs to come in for a recheck for bloodwork. Not sure what for.

## 2013-09-29 ENCOUNTER — Other Ambulatory Visit (INDEPENDENT_AMBULATORY_CARE_PROVIDER_SITE_OTHER): Payer: Managed Care, Other (non HMO)

## 2013-09-29 ENCOUNTER — Other Ambulatory Visit: Payer: Managed Care, Other (non HMO)

## 2013-09-29 DIAGNOSIS — E039 Hypothyroidism, unspecified: Secondary | ICD-10-CM

## 2013-09-30 ENCOUNTER — Other Ambulatory Visit: Payer: Self-pay | Admitting: Gynecology

## 2013-09-30 DIAGNOSIS — E039 Hypothyroidism, unspecified: Secondary | ICD-10-CM

## 2013-09-30 MED ORDER — THYROID 120 MG PO TABS: 120.0000 mg | ORAL_TABLET | Freq: Every day | ORAL | Status: DC

## 2013-10-02 ENCOUNTER — Telehealth: Payer: Self-pay | Admitting: *Deleted

## 2013-10-02 NOTE — Telephone Encounter (Signed)
Patient notified see labs 

## 2013-10-02 NOTE — Telephone Encounter (Signed)
Message copied by Lorraine Lax on Mon Oct 02, 2013  1:39 PM ------      Message from: Douglass Rivers      Created: Sat Sep 30, 2013  2:46 PM       inform that her TSH is now perfect!!  We should continue at the current dose and just confirm stability at her annual, refill will be sent in ------

## 2013-10-02 NOTE — Telephone Encounter (Signed)
Left Message To Call Back  

## 2013-11-29 ENCOUNTER — Telehealth: Payer: Self-pay | Admitting: Gynecology

## 2013-11-29 NOTE — Telephone Encounter (Signed)
Patient would like to talk to Dr.Lathrop's nurse regarding her HRT. Patient thinks she may want to increase the dose.

## 2013-11-29 NOTE — Telephone Encounter (Signed)
Message left to return call to [place] at [phone].   Last AEX-02/21/13 Last visit with Dr. Charlies Constable 05/15/13 with changes to hrt:We will change her to minivelle 0.375 and prometrium. No chart.

## 2013-12-01 NOTE — Telephone Encounter (Signed)
Patient returned call. She states that she feels she needs an increase in estrogen dosage. States that she is waking up in the middle of the night and "I am hot and I cannot go back to sleep and it's really disrupting my life." She is taking Melatonin without improvement. Wondering if increased estrogen will help.

## 2013-12-01 NOTE — Telephone Encounter (Signed)
Patient needs appointment per Dr. Charlies Constable.

## 2013-12-01 NOTE — Telephone Encounter (Signed)
Message left to return call to [place] at [phone], if patient returns call, can schedule appoitnment with Dr. Charlies Constable at patient's [employer]

## 2013-12-04 ENCOUNTER — Ambulatory Visit (INDEPENDENT_AMBULATORY_CARE_PROVIDER_SITE_OTHER): Payer: Managed Care, Other (non HMO) | Admitting: Gynecology

## 2013-12-04 VITALS — BP 130/88 | HR 88 | Resp 20 | Ht 65.0 in | Wt 219.0 lb

## 2013-12-04 DIAGNOSIS — R61 Generalized hyperhidrosis: Secondary | ICD-10-CM

## 2013-12-04 DIAGNOSIS — G4700 Insomnia, unspecified: Secondary | ICD-10-CM

## 2013-12-04 MED ORDER — ZOLPIDEM TARTRATE 10 MG PO TABS: ORAL_TABLET | ORAL | Status: DC

## 2013-12-04 NOTE — Telephone Encounter (Signed)
Patient requests appointment today. Scheduled for 5:15.

## 2013-12-04 NOTE — Telephone Encounter (Signed)
Pt calling to schedule appointment with Dr lathrop.

## 2013-12-04 NOTE — Progress Notes (Signed)
Pt here to discuss her medications.  She is on armour thyroid 120mg  after almost 1y to adjust, she was overstimulated.  She is also on vivelle dot and prometrium.  Pt reports that she is having night sweats that are disrupting her sleep and affecting her day.  Pt has lost 30# with an herbal supplement and exercising at gym-cardio and weights.  3-4x/wk-71m.   Pt is on line dating and is meeting him this week. Pt is taking HRT and has never had any issues with bleeding.  Pt wakes up in the middle of the night clammy and can't fall back to sleep.  Usually at 1-2am.  Pt feels like her mind is racing and she is not calm.  She is taking ashwaganda-for stress and cortisol, holy basil.pt is taking melatonin, SAM-E Pt also reports some flashes during the day but they are short in nature. Pt reports that she prefers to try ambien at 1/2 to 1mg  for bedtime. Pt to call re symtpoms, she had tried effexor in the past but did not like.  59m spent discussing insomnia and herbals and menopause, >50% face to face

## 2013-12-11 ENCOUNTER — Ambulatory Visit: Payer: Managed Care, Other (non HMO) | Admitting: Family Medicine

## 2013-12-27 ENCOUNTER — Telehealth: Payer: Self-pay | Admitting: Gynecology

## 2013-12-27 DIAGNOSIS — N951 Menopausal and female climacteric states: Secondary | ICD-10-CM

## 2013-12-27 NOTE — Telephone Encounter (Signed)
Routing to Dr. Charlies Constable for review and orders.

## 2013-12-27 NOTE — Telephone Encounter (Addendum)
Patient wants to increase the dosage on her "patch". Patient says she cannot take the Ambien Dr. Charlies Constable prescribed. Patient says you can call her pharmacy and  leave her a detailed message on her v.m.

## 2013-12-28 NOTE — Telephone Encounter (Signed)
Pt calling to check on message from yesterday.

## 2013-12-28 NOTE — Telephone Encounter (Addendum)
Dr. Charlies Constable can you review and advise. Patient requesting increase in estrogen dosage.    Message left to return call to [place] at [phone].

## 2013-12-29 MED ORDER — ESTRADIOL 0.05 MG/24HR TD PTTW: 1.0000 | MEDICATED_PATCH | TRANSDERMAL | Status: DC

## 2013-12-29 NOTE — Telephone Encounter (Signed)
Have been unable to reach patient. Message to advise the rx you have requested has been sent to the pharmacy. Advised Dr. Charlies Constable would like to see her to evaluate how she is feeling on increased dose. Message left to return call to [place] at [phone] for any questions.

## 2013-12-29 NOTE — Telephone Encounter (Signed)
Ok will increase only for a few months, afterwards, she will need ov to assess, order sent in

## 2014-01-02 NOTE — Telephone Encounter (Signed)
Message left to return call to [place] at [phone] and schedule follow up with Dr. Charlies Constable.

## 2014-01-03 NOTE — Telephone Encounter (Signed)
yes

## 2014-01-03 NOTE — Telephone Encounter (Signed)
Dr. Charlies Constable, I have been unable to reach her. Okay to close encounter?

## 2014-01-29 ENCOUNTER — Encounter: Payer: Self-pay | Admitting: Podiatry

## 2014-01-29 ENCOUNTER — Ambulatory Visit (INDEPENDENT_AMBULATORY_CARE_PROVIDER_SITE_OTHER): Payer: Managed Care, Other (non HMO) | Admitting: Podiatry

## 2014-01-29 ENCOUNTER — Ambulatory Visit: Payer: Managed Care, Other (non HMO) | Admitting: Podiatry

## 2014-01-29 VITALS — BP 148/83 | HR 65 | Resp 16 | Ht 65.0 in | Wt 200.0 lb

## 2014-01-29 DIAGNOSIS — L600 Ingrowing nail: Secondary | ICD-10-CM

## 2014-01-29 NOTE — Progress Notes (Signed)
Subjective:     Patient ID: Kimberly Reed, female   DOB: [birth year], 57 y.o.   MRN: [id number]  HPI patient presents that had damaged big toenail and second toenail right and it's been this way for years and I wanted to know if anything can be done. I've had ingrown toenails removed in the past which did not make a difference in the appearance of my nailbeds   Review of Systems  All other systems reviewed and are negative.       Objective:   Physical Exam  Nursing note and vitals reviewed. Constitutional: She is oriented to person, place, and time.  Cardiovascular: Intact distal pulses.   Musculoskeletal: Normal range of motion.  Neurological: She is oriented to person, place, and time.  Skin: Skin is warm.   neurovascular status intact with muscle strength adequate in range of motion within normal limits. Patient has several nails that are dystrophic and growing abnormally with no indications of fungal element on the remaining nailbeds     Assessment:     Probable trauma to the underlying nail plates in the absence of what I would consider fungal condition    Plan:     H&P reviewed and explained condition to patient. Do not think topical oral or laser will be effective and I encouraged her to pain over the nail plates. She was satisfied with this and will reappoint if they become tender

## 2014-01-29 NOTE — Progress Notes (Signed)
   Subjective:    Patient ID: Kimberly Reed, female    DOB: 04-Mar-1956, 58 y.o.   MRN: [id number]  HPI Comments: "I have a problem with the toenails"  Patient c/o thick, discolored 1st bilateral and 2nd toenail left for several years. Did have an injury initially to the toenail-1st right. She was seeing a podiatrist and she Rx'd a clear polish that she hasn't seen improvements. Would like to try Jublia.     Review of Systems  Constitutional: Positive for fatigue.  All other systems reviewed and are negative.       Objective:   Physical Exam        Assessment & Plan:

## 2014-02-12 ENCOUNTER — Ambulatory Visit: Payer: Managed Care, Other (non HMO) | Admitting: Podiatry

## 2014-02-14 ENCOUNTER — Ambulatory Visit (INDEPENDENT_AMBULATORY_CARE_PROVIDER_SITE_OTHER): Payer: Managed Care, Other (non HMO) | Admitting: Internal Medicine

## 2014-02-14 ENCOUNTER — Encounter: Payer: Self-pay | Admitting: Internal Medicine

## 2014-02-14 VITALS — BP 140/90 | Temp 98.1°F | Ht 65.0 in | Wt 204.0 lb

## 2014-02-14 DIAGNOSIS — J019 Acute sinusitis, unspecified: Secondary | ICD-10-CM

## 2014-02-14 DIAGNOSIS — G43909 Migraine, unspecified, not intractable, without status migrainosus: Secondary | ICD-10-CM

## 2014-02-14 MED ORDER — PREDNISONE 20 MG PO TABS: ORAL_TABLET | ORAL | Status: DC

## 2014-02-14 MED ORDER — AMOXICILLIN 500 MG PO CAPS: 500.0000 mg | ORAL_CAPSULE | Freq: Three times a day (TID) | ORAL | Status: DC

## 2014-02-14 NOTE — Progress Notes (Signed)
Pre visit review using our clinic review tool, if applicable. No additional management support is needed unless otherwise documented below in the visit note.   Chief Complaint  Patient presents with  . Nasal Congestion    Started on Saturday.  . Cough    HPI: Patient comes in today for SDA for  new problem evaluation. Onset 4 days ago head issues  adn hot a feverish and chilss and then to chest. And like siinusitis .  Almost migrainish.    For psych   Vomiting    In parking lot.  Like a sinus migraine and sinus " rtook off 2 days of work  Vomiting less.  Has been having dry congested   This season  Using netti pot.   Getting.  Clear.   But lots. She thinks it's a sinus infection and will get better without an antibiotic however she doesn't have a fever does feel bad no current vomiting has headache plus face pressure. Dentist said it wasn't residual from her to use extraction. She also reports that she had a infected tooth pulled in the recent past and had just gotten off antibiotics but she doesn't know which one she was given one initially they gave her side effects. ROS: See pertinent positives and negatives per HPI.  Past Medical History  Diagnosis Date  . Hypothyroid   . Glaucoma   . Hyperlipemia   . Migraine   . History of syncope 2008    loss of bladder control eval by Neuro    Family History  Problem Relation Age of Onset  . Aneurysm Mother   . Kidney disease Mother   . Hypertension Father   . Dementia Father   . Kidney disease Brother   . Heart disease      "all of moms side"  . Heart failure Cousin   . Depression      History   Social History  . Marital Status: Single    Spouse Name: N/A    Number of Children: N/A  . Years of Education: N/A   Social History Main Topics  . Smoking status: Never Smoker   . Smokeless tobacco: None  . Alcohol Use: No  . Drug Use: Yes     Comment: in 20's but not now  . Sexual Activity: Yes    Partners: Female   Other  Topics Concern  . 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     Hhof 1  2 cats    No tad    8 hours of sleep.           Outpatient Encounter Prescriptions as of 02/14/2014  Medication Sig  . brimonidine-timolol (COMBIGAN) 0.2-0.5 % ophthalmic solution Place 1 drop into both eyes every 12 (twelve) hours.    . Cholecalciferol (VITAMIN D-3) 5000 UNITS TABS Take 1 tablet by mouth daily.  . dorzolamide (TRUSOPT) 2 % ophthalmic solution 1 drop 3 (three) times daily.    [name] estradiol (VIVELLE-DOT) 0.05 MG/24HR patch Place 1 patch (0.05 mg total) onto the skin 2 (two) times a week.  [name] MELATONIN PO Take by mouth.  . Misc Natural Products (CORTISOL PO) Take by mouth.  . Multiple Vitamin (MULTIVITAMIN WITH MINERALS) TABS Take 1 tablet by mouth daily.  . progesterone (PROMETRIUM) 100 MG capsule Take 1 capsule (100 mg total) by mouth daily.  [name] thyroid (ARMOUR THYROID) 120  MG tablet Take 1 tablet (120 mg total) by mouth daily before breakfast.  . Travoprost, BAK Free, (TRAVATAN) 0.004 % SOLN ophthalmic solution Place 1 drop into both eyes at bedtime.  [name] UNABLE TO FIND Med Name: Ashwaganda  . UNABLE TO FIND Med Name: Hart Robinsons  . amoxicillin (AMOXIL) 500 MG capsule Take 1 capsule (500 mg total) by mouth 3 (three) times daily.  . predniSONE (DELTASONE) 20 MG tablet Take 3 po qd for 1 days then 2 po qd for 4 days,or as directed  . [DISCONTINUED] estradiol (MINIVELLE) 0.0375 MG/24HR Place 1 patch onto the skin 2 (two) times a week. Apply anywhere on lower abdomen.  Change patch on Sun am and Wed pm.  After you remove a patch, remove any adhesive remaining on your skin with baby oil on a cotton ball.  . [DISCONTINUED] zolpidem (AMBIEN) 10 MG tablet 1/2 to 1 tablet as needed to sleep    EXAM:  BP 140/90  Temp(Src) 98.1 F (36.7 C) (Oral)  Ht 5\' 5"  (1.651 m)  Wt 204 lb (92.534 kg)  BMI 33.95 kg/m2  LMP 02/15/2013  Body mass index is 33.95  kg/(m^2). Wt Readings from Last 3 Encounters:  02/14/14 204 lb (92.534 kg)  01/29/14 200 lb (90.719 kg)  12/04/13 219 lb (99.338 kg)    GENERAL: vitals reviewed and listed above, alert, oriented, appears well hydrated and in no acute distress looks tired and moderately congested verbal HEENT: atraumatic, conjunctiva  clear, no obvious abnormalities on inspection of external nose and ears TMs intact no acute findings nares +2 congested face tender as well as bifrontal area no discharge seen OP : no lesion edema or exudate  NECK: no obvious masses on inspection palpation  LUNGS: clear to auscultation bilaterally, no wheezes, rales or rhonchi, good air movement CV: HRRR, no clubbing cyanosis or  peripheral edema nl cap refill  MS: moves all extremities without noticeable focal  abnormality PSYCH: Talkative moderately anxious cooperative  ASSESSMENT AND PLAN:  Discussed the following assessment and plan:  Sinusitis, acute - This could be allergic triggering her migraine and she was on antibiotics however patient feels that this is a bacterial sinus infection;   Migraine headache Add short course of prednisone mood and aggravation of insomnia risk discussed but I think this would be helpful because this is a high pollen season and some of her symptoms sound allergic. She is copious clear watery discharge. Amoxicillin sent in to her pharmacy she can calls if she thinks that's the one that your chart he been on and we can change it. Expectant management discussed many sinus infections get better on her own however I agree with intervention because of the severity and it triggering her migraine. -Patient advised to return or notify health care team  if symptoms worsen ,persist or new concerns arise.  Patient Instructions  Most sinusiits gets better on its own after  7- 10 days  But if sever can add antibiotic  also prednisone may help to decrease face and sinus inflammation.  This sinus infection  could also be an allergic sinus inflammation.    Antibiotic and prednisone today  .   Should improve in the next 3-5 days .        Standley Brooking. Panosh M.D.

## 2014-02-14 NOTE — Patient Instructions (Addendum)
Most sinusiits gets better on its own after  7- 10 days  But if sever can add antibiotic  also prednisone may help to decrease face and sinus inflammation.  This sinus infection could also be an allergic sinus inflammation.    Antibiotic and prednisone today  .   Should improve in the next 3-5 days .

## 2014-02-19 ENCOUNTER — Ambulatory Visit (INDEPENDENT_AMBULATORY_CARE_PROVIDER_SITE_OTHER): Payer: Managed Care, Other (non HMO) | Admitting: Internal Medicine

## 2014-02-19 ENCOUNTER — Encounter: Payer: Self-pay | Admitting: Internal Medicine

## 2014-02-19 VITALS — BP 136/80 | Temp 98.0°F | Ht 65.0 in | Wt 202.0 lb

## 2014-02-19 DIAGNOSIS — Z569 Unspecified problems related to employment: Secondary | ICD-10-CM

## 2014-02-19 DIAGNOSIS — J019 Acute sinusitis, unspecified: Secondary | ICD-10-CM

## 2014-02-19 DIAGNOSIS — T887XXA Unspecified adverse effect of drug or medicament, initial encounter: Secondary | ICD-10-CM

## 2014-02-19 DIAGNOSIS — F4323 Adjustment disorder with mixed anxiety and depressed mood: Secondary | ICD-10-CM

## 2014-02-19 DIAGNOSIS — Z566 Other physical and mental strain related to work: Secondary | ICD-10-CM

## 2014-02-19 MED ORDER — DOXYCYCLINE HYCLATE 100 MG PO CAPS: 100.0000 mg | ORAL_CAPSULE | Freq: Two times a day (BID) | ORAL | Status: DC

## 2014-02-19 NOTE — Patient Instructions (Addendum)
I  agree with stopping .  the prednisone .  Changing antibiotic .  I think you need to see  behavioral health  Providers  Sooner than a month.  [place] [facility]   [phone] also ask your counselor for referrals.,  We can write a note for work for last t week and this week  For time lost for medical reasons    Further should be from your  Behavioral health providers .  Or behavioral  health  Let us know if we need to write a note for abscece from work for medical reasons as we discussed .

## 2014-02-19 NOTE — Progress Notes (Signed)
Chief Complaint  Patient presents with  . Dizziness    Stopped prednisone due to dizziness.  Also stopped amoxicillin due to it being the same as what the dentist prescribed.    HPI:  Patient comes in today for SDA for  new problem evaluation. pred helped for a day but then she sayspred caused suicidal  So stopped  Has many anxiety psyc c/o she has listed    Antibiotic  amopx  wasw the same  As the dentist.  So didn't take it.   Still has some HA  Not as bad.   To see psychiatrist but had to get   Delayed from illness. Cant get appt until may.  26th . Stress and asked if i could write her out until that appt  Because her work site said pcp could wirite a note.   Has many co tha she says would cause psych to write her out .   ROS: See pertinent positives and negatives per HPI. No cp sob  No bleeding  Still congested    Past Medical History  Diagnosis Date  . Hypothyroid   . Glaucoma   . Hyperlipemia   . Migraine   . History of syncope 2008    loss of bladder control eval by Neuro    Family History  Problem Relation Age of Onset  . Aneurysm Mother   . Kidney disease Mother   . Hypertension Father   . Dementia Father   . Kidney disease Brother   . Heart disease      "all of moms side"  . Heart failure Cousin   . Depression      History   Social History  . Marital Status: Single    Spouse Name: N/A    Number of Children: N/A  . Years of Education: N/A   Social History Main Topics  . Smoking status: Never Smoker   . Smokeless tobacco: None  . Alcohol Use: No  . Drug Use: Yes     Comment: in 20's but not now  . Sexual Activity: Yes    Partners: Female   Other Topics Concern  . 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     Hhof 1  2 cats    No tad    8 hours of sleep.           Outpatient Encounter Prescriptions as of 02/19/2014  Medication Sig  . brimonidine-timolol (COMBIGAN) 0.2-0.5 % ophthalmic  solution Place 1 drop into both eyes every 12 (twelve) hours.    . Cholecalciferol (VITAMIN D-3) 5000 UNITS TABS Take 1 tablet by mouth daily.  . dorzolamide (TRUSOPT) 2 % ophthalmic solution 1 drop 3 (three) times daily.    [name] estradiol (VIVELLE-DOT) 0.05 MG/24HR patch Place 1 patch (0.05 mg total) onto the skin 2 (two) times a week.  [name] MELATONIN PO Take by mouth.  . Misc Natural Products (CORTISOL PO) Take by mouth.  . Multiple Vitamin (MULTIVITAMIN WITH MINERALS) TABS Take 1 tablet by mouth daily.  . progesterone (PROMETRIUM) 100 MG capsule Take 1 capsule (100 mg total) by mouth daily.  [name] thyroid (ARMOUR THYROID) 120 MG tablet Take 1 tablet (120 mg total) by mouth daily before breakfast.  . Travoprost, BAK Free, (TRAVATAN) 0.004 % SOLN ophthalmic solution Place 1 drop into both eyes at bedtime.  [name] UNABLE TO FIND Med Name:  Ashwaganda  . UNABLE TO FIND Med Name: Hart Robinsons  . amoxicillin (AMOXIL) 500 MG capsule Take 1 capsule (500 mg total) by mouth 3 (three) times daily.  [name] doxycycline (VIBRAMYCIN) 100 MG capsule Take 1 capsule (100 mg total) by mouth 2 (two) times daily.  . predniSONE (DELTASONE) 20 MG tablet Take 3 po qd for 1 days then 2 po qd for 4 days,or as directed    EXAM:  BP 136/80  Temp(Src) 98 F (36.7 C) (Oral)  Ht 5\' 5"  (1.651 m)  Wt 202 lb (91.627 kg)  BMI 33.61 kg/m2  LMP 02/15/2013  Body mass index is 33.61 kg/(m^2).  GENERAL: vitals reviewed and listed above, alert, oriented, appears well hydrated and in no acute distress but congested  weaing dark glasses and tearful at times  HEENT: atraumatic, conjunctiva  clear,  On conj hem right no obvious abnormalities on inspection of external nose and earscongested face mildy tenders  OP : no lesion edema or exudate  NECK: no obvious masses on inspection palpation  LUNGS: clear to auscultation bilaterally, no wheezes, rales or rhonchi,  CV: HRRR, no clubbing cyanosis or  peripheral edema nl cap refill  MS: moves all  extremities without noticeable focal  abnormality PSYCH: ,  depression  anxiety some rapid speech .  ASSESSMENT AND PLAN: Number given for bh but they cant see her sooner either  Discussed the following assessment and plan:  Sinusitis, acute  Adjustment disorder with mixed anxiety and depressed mood - advise speadk with counselor about sooner intervention  or seek emergent care if needed   Stress at work  Medication side effect - prednisone Crying  During exam.  Would have written out of work.  [facility] ;. Gareth Eagle. sarvis  Is counselor  And to see her tomorrow   And clay   Shugart  Is the pac  To be seen for appt  In MAY  [id number] consdier bipolar and or personality dis Aggravated by illness and pred . Offered to write note for this medical illness but other should be from treating MD.   -Patient advised to return or notify health care team  if symptoms worsen ,persist or new concerns arise.  Patient Instructions  I  agree with stopping .  the prednisone .  Changing antibiotic .  I think you need to see  behavioral health  Providers  Sooner than a month.  [facility]   [phone] also ask your counselor for referrals.,  We can write a note for work for last t week and this week  For time lost for medical reasons    Further should be from your  Behavioral health providers .  Or behavioral  health  Let us know if we need to write a note for abscece from work for medical reasons as we discussed .         Standley Brooking. Melisssa Donner M.D.  Pre visit review using our clinic review tool, if applicable. No additional management support is needed unless otherwise documented below in the visit note. Total visit 6mins > 50% spent counseling and coordinating care

## 2014-02-26 ENCOUNTER — Ambulatory Visit: Payer: Self-pay | Admitting: Medical

## 2014-02-26 ENCOUNTER — Institutional Professional Consult (permissible substitution): Payer: Managed Care, Other (non HMO) | Admitting: Pulmonary Disease

## 2014-02-27 ENCOUNTER — Telehealth: Payer: Self-pay | Admitting: Internal Medicine

## 2014-02-27 NOTE — Telephone Encounter (Signed)
Medical records request received via fax from [facility] advising patient's request to have all records permanently transferred to their office as pt has requested to change PCP and establish care at their clinic.

## 2014-03-02 ENCOUNTER — Other Ambulatory Visit: Payer: Self-pay | Admitting: *Deleted

## 2014-03-02 DIAGNOSIS — E039 Hypothyroidism, unspecified: Secondary | ICD-10-CM

## 2014-03-02 MED ORDER — THYROID 120 MG PO TABS: 120.0000 mg | ORAL_TABLET | Freq: Every day | ORAL | Status: DC

## 2014-03-02 NOTE — Telephone Encounter (Signed)
Last AEX 03/07/12 Last refill 09/30/13 #90/1 refill  No future appt scheduled.  Will refill for 1 month. Patient will need AEX for further refills.

## 2014-03-05 ENCOUNTER — Encounter: Payer: Self-pay | Admitting: Medical

## 2014-03-05 ENCOUNTER — Ambulatory Visit (INDEPENDENT_AMBULATORY_CARE_PROVIDER_SITE_OTHER): Payer: Managed Care, Other (non HMO) | Admitting: Medical

## 2014-03-05 VITALS — BP 100/80 | HR 60 | Temp 97.8°F | Resp 16 | Ht 65.0 in | Wt 199.0 lb

## 2014-03-05 DIAGNOSIS — R5383 Other fatigue: Secondary | ICD-10-CM

## 2014-03-05 DIAGNOSIS — R12 Heartburn: Secondary | ICD-10-CM

## 2014-03-05 DIAGNOSIS — R1013 Epigastric pain: Secondary | ICD-10-CM

## 2014-03-05 DIAGNOSIS — G4700 Insomnia, unspecified: Secondary | ICD-10-CM

## 2014-03-05 DIAGNOSIS — F341 Dysthymic disorder: Secondary | ICD-10-CM

## 2014-03-05 DIAGNOSIS — F4323 Adjustment disorder with mixed anxiety and depressed mood: Secondary | ICD-10-CM

## 2014-03-05 DIAGNOSIS — Z8249 Family history of ischemic heart disease and other diseases of the circulatory system: Secondary | ICD-10-CM

## 2014-03-05 DIAGNOSIS — F418 Other specified anxiety disorders: Secondary | ICD-10-CM

## 2014-03-05 DIAGNOSIS — R5381 Other malaise: Secondary | ICD-10-CM

## 2014-03-05 LAB — CBC WITH DIFFERENTIAL/PLATELET
BASOS ABS: 0 10*3/uL (ref 0.0–0.1)
BASOS PCT: 0 % (ref 0–1)
EOS PCT: 4 % (ref 0–5)
Eosinophils Absolute: 0.2 10*3/uL (ref 0.0–0.7)
HCT: 37.5 % (ref 36.0–46.0)
Hemoglobin: 12.6 g/dL (ref 12.0–15.0)
LYMPHS PCT: 33 % (ref 12–46)
Lymphs Abs: 1.7 10*3/uL (ref 0.7–4.0)
MCH: 29.3 pg (ref 26.0–34.0)
MCHC: 33.6 g/dL (ref 30.0–36.0)
MCV: 87.2 fL (ref 78.0–100.0)
Monocytes Absolute: 0.6 10*3/uL (ref 0.1–1.0)
Monocytes Relative: 11 % (ref 3–12)
NEUTROS ABS: 2.8 10*3/uL (ref 1.7–7.7)
Neutrophils Relative %: 52 % (ref 43–77)
PLATELETS: 373 10*3/uL (ref 150–400)
RBC: 4.3 MIL/uL (ref 3.87–5.11)
RDW: 15.3 % (ref 11.5–15.5)
WBC: 5.3 10*3/uL (ref 4.0–10.5)

## 2014-03-05 LAB — COMPREHENSIVE METABOLIC PANEL
ALT: 10 U/L (ref 0–35)
AST: 14 U/L (ref 0–37)
Albumin: 3.9 g/dL (ref 3.5–5.2)
Alkaline Phosphatase: 52 U/L (ref 39–117)
BUN: 8 mg/dL (ref 6–23)
CALCIUM: 9.9 mg/dL (ref 8.4–10.5)
CHLORIDE: 102 meq/L (ref 96–112)
CO2: 28 meq/L (ref 19–32)
Creat: 0.73 mg/dL (ref 0.50–1.10)
Glucose, Bld: 90 mg/dL (ref 70–99)
Potassium: 4.2 mEq/L (ref 3.5–5.3)
SODIUM: 135 meq/L (ref 135–145)
TOTAL PROTEIN: 7.1 g/dL (ref 6.0–8.3)
Total Bilirubin: 0.6 mg/dL (ref 0.2–1.2)

## 2014-03-05 LAB — T4, FREE: Free T4: 1.07 ng/dL (ref 0.80–1.80)

## 2014-03-05 LAB — TSH: TSH: 0.949 u[IU]/mL (ref 0.350–4.500)

## 2014-03-05 MED ORDER — OMEPRAZOLE 20 MG PO CPDR: DELAYED_RELEASE_CAPSULE | ORAL | Status: DC

## 2014-03-05 NOTE — Progress Notes (Signed)
Subjective:   Kimberly Reed is a 58 y.o. female presenting on 03/05/2014 with Stress and dry mouth  Here as a new patient today.  She reports wanting to establish with new primary care provider.  She started in today with a whole list of complaints and concerns, hard for me to get a word in.   She additionally after 15 minutes of history gave me a list of ongoing complaints that she had documented on her phone diary (pages worth of concerns).  She notes suffering from depression and anxiety, not been feeling well.  She notes ongoing problems with insomnia.  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  Has worse stressors this past year.  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  She has established with psychiatrist, had to have appt delayed due to getting sick, but will see them today.  Having lots of anxiety in her life.  Having issues with focusing.  She is worried that she is losing her job.  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 Has seen counseling some.   Lost 70 lb in the last year mostly intentionally, but some weight loss maybe due to stress, last 20lb.  Insomnia - will sleep for few hours at a time.  Takes melatonin.   Has used Prozac and Zoloft in the past but didn't tolerate them.  In addition to her stressors, she notes tooth infection a month ago, and this hit her one day at work that really hurt.  Ended up being on antibiotics and subsequently had the tooth pulled by oral surgeon.  Both antibiotics she was on made her sick.   Having hard time with indigestion, heartburn, gets nauseated with certain foods.    Has family hx/o heart disease, mom died of heart disease, brother recently seen for dissecting aorta.   She decided to make appt with cardiology for baseline eval.  Sees cardiology Dr. Percival Spanish may 20th.     Review of Systems ROS as in subjective      Objective:    Filed Vitals:   03/05/14 1001  BP: 100/80  Pulse: 60  Temp: 97.8 F (36.6  C)  Resp: 16    General appearance: alert, no distress, WD/WN Oral cavity: MMM, no lesions Neck: supple, no lymphadenopathy, no thyromegaly, no masses Heart: RRR, normal S1, S2, no murmurs Lungs: CTA bilaterally, no wheezes, rhonchi, or rales Abdomen: +bs, soft, non tender, non distended, no masses, no hepatomegaly, no splenomegaly Pulses: 2+ symmetric, upper and lower extremities, normal cap refill Ext: no edema Psych: pleasant, good eye contact, but history is all over the place and hard to get word in with her      Assessment: Encounter Diagnoses  Name Primary?  . Depression with anxiety Yes  . Other malaise and fatigue   . Adjustment disorder with mixed anxiety and depressed mood   . Insomnia   . Heartburn   . Abdominal pain, epigastric   . Family history of heart disease      Plan: I tried to make sense of her concerns and complaints to give guidance.  baseline labs today to rule out obvious causes of her fatigue and collection of symptoms.    Begin Omeprazole for possible stress ulcer.  She has script at home from gynecology for Ambien she will begin for insomnia.  F/u with psychiatry today for new consult.   Counseled on the fact that many of her physical complaints are related to her mental  state and anxiety.   Advised that treatment should c/t to include counseling and psychiatry followup.  I reviewed prior chart records related to the same list of complaints today.  F/u as planned with scheduled cardiology consult for screening given family history.  Of note, sees Rosary Lively for counseling at [phone].  Will be seeing Comer Locket, mental health PA at same office today for psychiatry/medication.  [name] was seen today for stress and dry mouth.  Diagnoses and associated orders for this visit:  Depression with anxiety - Comprehensive metabolic panel - CBC with Differential - TSH - T4, free  Other malaise and fatigue - Comprehensive metabolic  panel - CBC with Differential - TSH - T4, free  Adjustment disorder with mixed anxiety and depressed mood  Insomnia - Comprehensive metabolic panel - CBC with Differential - TSH - T4, free  Heartburn - Comprehensive metabolic panel - CBC with Differential - TSH - T4, free  Abdominal pain, epigastric - Comprehensive metabolic panel - CBC with Differential - TSH - T4, free  Family history of heart disease  Other Orders - omeprazole (PRILOSEC) 20 MG capsule; 1 tablet po 30-45 minutes before breakfast    Return pending lab.

## 2014-03-05 NOTE — Patient Instructions (Signed)
  Thank you for giving me the opportunity to serve you today.    Your diagnosis today includes: Encounter Diagnoses  Name Primary?  . Depression with anxiety Yes  . Other malaise and fatigue   . Adjustment disorder with mixed anxiety and depressed mood   . Insomnia   . Heartburn   . Abdominal pain, epigastric   . Family history of heart disease      Specific recommendations today include:  Begin omeprazole once daily 30-45 minutes prior to breakfast to help with possible stress ulcer/abdominal pain and nausea  Followup with psychiatrist today as planned  Continue counseling  Begin the prescription of Ambien you have at home for as needed use for sleep  Try and get some form of exercise such as walking daily to help with stress and for physical health  We will check labs today to make sure there are no obvious abnormalities  Followup as planned to see the cardiologist for baseline evaluation given your family history of heart disease  Return pending lab.    I have included other useful information below for your review.  Adjustment Disorder Most changes in life can cause stress. Getting used to changes may take a few months or longer. If feelings of stress, hopelessness, or worry continue, you may have an adjustment disorder. This stress-related mental health problem may affect your feelings, thinking and how you act. It occurs in both sexes and happens at any age. SYMPTOMS  Some of the following problems may be seen and vary from person to person:  Sadness or depression.  Loss of enjoyment.  Thoughts of suicide.  Fighting.  Avoiding family and friends.  Poor school performance.  Hopelessness, sense of loss.  Trouble sleeping.  Vandalism.  Worry, weight loss or gain.  Crying spells.  Anxiety  Reckless driving.  Skipping school.  Poor work Systems analyst.  Nervousness.  Ignoring bills.  Poor attitude. DIAGNOSIS  Your caregiver will ask what  has happened in your life and do a physical exam. They will make a diagnosis of an adjustment disorder when they are sure another problem or medical illness causing your feelings does not exist. TREATMENT  When problems caused by stress interfere with you daily life or last longer than a few months, you may need counseling for an adjustment disorder. Early treatment may diminish problems and help you to better cope with the stressful events in your life. Sometimes medication is necessary. Individual counseling and or support groups can be very helpful. PROGNOSIS  Adjustment disorders usually last less than 3 to 6 months. The condition may persist if there is long lasting stress. This could include health problems, relationship problems, or job difficulties where you can not easily escape from what is causing the problem. PREVENTION  Even the most mentally healthy, highly functioning people can suffer from an adjustment disorder given a significant blow from a life-changing event. There is no way to prevent pain and loss. Most people need help from time to time. You are not alone. SEEK MEDICAL CARE IF:  Your feelings or symptoms listed above do not improve or worsen. Document Released: 06/16/2006 Document Revised: 01/04/2012 Document Reviewed: 09/07/2007 [facility] Patient Information 2014 [place], [place].

## 2014-03-11 ENCOUNTER — Telehealth: Payer: Self-pay | Admitting: Medical

## 2014-03-14 ENCOUNTER — Ambulatory Visit: Payer: Managed Care, Other (non HMO) | Admitting: Cardiology

## 2014-03-15 ENCOUNTER — Telehealth: Payer: Self-pay | Admitting: Medical

## 2014-03-15 MED ORDER — RANITIDINE HCL 150 MG PO TABS: 150.0000 mg | ORAL_TABLET | Freq: Two times a day (BID) | ORAL | Status: DC

## 2014-03-15 NOTE — Telephone Encounter (Signed)
I prescribed Prilosec for her heartburn and belly pain, however her insurance company is requiring her to fail other medications first. Thus I called out ranitidine twice daily instead. Please make sure she is aware

## 2014-03-15 NOTE — Telephone Encounter (Signed)
Advised patient/WL

## 2014-03-20 ENCOUNTER — Telehealth: Payer: Self-pay | Admitting: Gynecology

## 2014-03-20 DIAGNOSIS — E039 Hypothyroidism, unspecified: Secondary | ICD-10-CM

## 2014-03-20 NOTE — Telephone Encounter (Signed)
Patient calling to see when she is due for a recheck for her thyroid. She says she has "lost track of what appointments she needs." Please advise?  [employer]

## 2014-03-20 NOTE — Telephone Encounter (Signed)
Spoke with patient. Patient states that she was seen by her primary care doctor on 5/11 and had her thyroid level checked at that appointment. Patient was supposed to come in to have thyroid checked for refill of medication but states "I already had it drawn two weeks ago so I am hoping that I do not need it again." Advised would send a message to Dr.Lathrop to let her know about lab drawn on 5/11 (results in EPIC) so that she can review them. Advised would give patient a call back with any further instructions from Dr.Lathrop. Patient agreeable. Patient states that she needs to come in for office visit with Dr.Lathop for follow up on [place]. Patient is currently in the car and would like to call back to schedule that appointment.

## 2014-03-21 MED ORDER — THYROID 120 MG PO TABS: 120.0000 mg | ORAL_TABLET | Freq: Every day | ORAL | Status: DC

## 2014-03-21 NOTE — Telephone Encounter (Signed)
Spoke with patient. Advised of message from Dr.Lathrop. Patient agreeable. Patient would like to go ahead and schedule annual at this time. Patient requesting to come in on a Monday in July. Appointment scheduled for July 13th at 11:00am with Dr.Lathrop. Patient agreeable to date and time.  Routing to provider for final review. Patient agreeable to disposition. Will close encounter

## 2014-03-21 NOTE — Telephone Encounter (Signed)
i reveiwed the labs, they were great, i also saw that she started viibryd, i hope it is working well for her, refill of armour thyroid sent in #90, she is due for annual here, that should get her through

## 2014-03-22 ENCOUNTER — Ambulatory Visit (INDEPENDENT_AMBULATORY_CARE_PROVIDER_SITE_OTHER): Payer: Managed Care, Other (non HMO) | Admitting: Cardiovascular Disease

## 2014-03-22 ENCOUNTER — Encounter: Payer: Self-pay | Admitting: Cardiovascular Disease

## 2014-03-22 VITALS — BP 106/68 | HR 58 | Ht 65.0 in | Wt 201.9 lb

## 2014-03-22 DIAGNOSIS — R0789 Other chest pain: Secondary | ICD-10-CM | POA: Insufficient documentation

## 2014-03-22 NOTE — Telephone Encounter (Signed)
Pt was switched to Ranitidine

## 2014-03-22 NOTE — Progress Notes (Signed)
03/22/2014 Kimberly Reed   May 03, 1956  [id number]  Primary Physician Crisoforo Oxford, PA-C Primary Cardiologist: Lorretta Harp MD [name]   HPI:  [name] is a moderately overweight 58 year old single Caucasian female with no children who is self-referred. She works as a Radiation protection practitioner for [employer]. She is seeing me because of family history of heart disease and atypical chest pain. Risk factors are only notable for family history with a mother who had myocardial infarctions and died at age 18 and a younger brother who had a sudden aortic dissection age 10. She has never had a heart attack or stroke. She has occasional twinges under her left breast.   Current Outpatient Prescriptions  Medication Sig Dispense Refill  . brimonidine-timolol (COMBIGAN) 0.2-0.5 % ophthalmic solution Place 1 drop into both eyes every 12 (twelve) hours.        . Cholecalciferol (VITAMIN D-3) 5000 UNITS TABS Take 1 tablet by mouth daily.      . dorzolamide (TRUSOPT) 2 % ophthalmic solution 1 drop 3 (three) times daily.        [name] estradiol (VIVELLE-DOT) 0.05 MG/24HR patch Place 1 patch (0.05 mg total) onto the skin 2 (two) times a week.  8 patch  3  . MELATONIN PO Take by mouth.      . Misc Natural Products (CORTISOL PO) Take by mouth.      . Multiple Vitamin (MULTIVITAMIN WITH MINERALS) TABS Take 1 tablet by mouth daily.      [name] omeprazole (PRILOSEC) 20 MG capsule 1 tablet po 30-45 minutes before breakfast  30 capsule  2  . predniSONE (DELTASONE) 20 MG tablet Take 3 po qd for 1 days then 2 po qd for 4 days,or as directed  11 tablet  0  . progesterone (PROMETRIUM) 100 MG capsule Take 1 capsule (100 mg total) by mouth daily.  30 capsule  11  . ranitidine (ZANTAC) 150 MG tablet Take 1 tablet (150 mg total) by mouth 2 (two) times daily.  60 tablet  0  . thyroid (ARMOUR THYROID) 120 MG tablet Take 1 tablet (120 mg total) by mouth daily before breakfast.  90 tablet  0    . Travoprost, BAK Free, (TRAVATAN) 0.004 % SOLN ophthalmic solution Place 1 drop into both eyes at bedtime.      [name] UNABLE TO FIND Med Name: Ashwaganda      . UNABLE TO FIND Med Name: Hart Robinsons      . clonazePAM (KLONOPIN) 0.5 MG tablet Take by mouth at bedtime. Takes 1/4 tablet       No current facility-administered medications for this visit.    Allergies  Allergen Reactions  . Effexor [Venlafaxine] Nausea And Vomiting    History   Social History  . Marital Status: Single    Spouse Name: N/A    Number of Children: N/A  . Years of Education: N/A   Occupational History  . Not on file.   Social History Main Topics  . Smoking status: Never Smoker   . Smokeless tobacco: Never Used  . Alcohol Use: No  . Drug Use: Yes     Comment: in 20's but not now  . Sexual Activity: Yes    Partners: Female   Other Topics Concern  . Not on file   Social History Narrative   Social :  [place]      For over a year.     Hhof 1  2 cats    No tad    8 hours of sleep.            Review of Systems: General: negative for chills, fever, night sweats or weight changes.  Cardiovascular: negative for chest pain, dyspnea on exertion, edema, orthopnea, palpitations, paroxysmal nocturnal dyspnea or shortness of breath Dermatological: negative for rash Respiratory: negative for cough or wheezing Urologic: negative for hematuria Abdominal: negative for nausea, vomiting, diarrhea, bright red blood per rectum, melena, or hematemesis Neurologic: negative for visual changes, syncope, or dizziness All other systems reviewed and are otherwise negative except as noted above.    Blood pressure 106/68, pulse 58, height 5\' 5"  (1.651 m), weight 201 lb 14.4 oz (91.581 kg), last menstrual period 02/15/2013.  General appearance: alert and no distress Neck: no adenopathy, no carotid bruit, no JVD, supple, symmetrical, trachea midline and thyroid not enlarged, symmetric, no  tenderness/mass/nodules Lungs: clear to auscultation bilaterally Heart: regular rate and rhythm, S1, S2 normal, no murmur, click, rub or gallop Extremities: extremities normal, atraumatic, no cyanosis or edema  EKG sinus bradycardia at 58 without ST or T wave changes  ASSESSMENT AND PLAN:   Atypical chest pain Patient has essentially no cardiac risk factors other than family history. Her mother died of myocardial infarctions at age 22 and her brother had an aortic dissection at age 71. She has atypical left him from inframammary stabbing chest pain. I'm going to get a routine exercise treadmill to rule out an ischemic etiology.      Lorretta Harp MD FACP,FACC,FAHA, [facility] 03/22/2014 2:46 PM

## 2014-03-22 NOTE — Patient Instructions (Signed)
Your physician has requested that you have en exercise stress myoview. For further information please visit [email or web address]. Please follow instruction sheet, as given.  Your physician recommends that you schedule a follow-up appointment as needed with Dr. Gwenlyn Found unless stress test is abnormal.

## 2014-03-22 NOTE — Assessment & Plan Note (Signed)
Patient has essentially no cardiac risk factors other than family history. Her mother died of myocardial infarctions at age 60 and her brother had an aortic dissection at age 82. She has atypical left him from inframammary stabbing chest pain. I'm going to get a routine exercise treadmill to rule out an ischemic etiology.

## 2014-03-26 ENCOUNTER — Other Ambulatory Visit: Payer: Self-pay | Admitting: *Deleted

## 2014-03-26 DIAGNOSIS — E039 Hypothyroidism, unspecified: Secondary | ICD-10-CM

## 2014-03-26 MED ORDER — THYROID 120 MG PO TABS: 120.0000 mg | ORAL_TABLET | Freq: Every day | ORAL | Status: DC

## 2014-03-26 NOTE — Telephone Encounter (Signed)
Incoming fax from [place] requesting 90 day supply of Armour 120 mg  Rx approved on 03/21/14 by Dr TL 90 day supply sent to [facility]. Rx sent today to [facility] #90/0 refills  Next appt 04/2014

## 2014-04-06 ENCOUNTER — Ambulatory Visit: Payer: Managed Care, Other (non HMO) | Admitting: Cardiovascular Disease

## 2014-04-13 ENCOUNTER — Ambulatory Visit: Payer: Managed Care, Other (non HMO) | Admitting: Cardiology

## 2014-04-24 ENCOUNTER — Institutional Professional Consult (permissible substitution): Payer: Managed Care, Other (non HMO) | Admitting: Pulmonary Disease

## 2014-04-26 ENCOUNTER — Ambulatory Visit (INDEPENDENT_AMBULATORY_CARE_PROVIDER_SITE_OTHER): Payer: Managed Care, Other (non HMO) | Admitting: Pulmonary Disease

## 2014-04-26 ENCOUNTER — Encounter: Payer: Self-pay | Admitting: Pulmonary Disease

## 2014-04-26 VITALS — BP 130/80 | HR 65 | Temp 97.9°F | Ht 65.0 in | Wt 202.6 lb

## 2014-04-26 DIAGNOSIS — R0683 Snoring: Secondary | ICD-10-CM | POA: Insufficient documentation

## 2014-04-26 DIAGNOSIS — G4700 Insomnia, unspecified: Secondary | ICD-10-CM | POA: Insufficient documentation

## 2014-04-26 DIAGNOSIS — R0609 Other forms of dyspnea: Secondary | ICD-10-CM

## 2014-04-26 DIAGNOSIS — R0989 Other specified symptoms and signs involving the circulatory and respiratory systems: Secondary | ICD-10-CM

## 2014-04-26 NOTE — Progress Notes (Signed)
Subjective:    Patient ID: Kimberly Reed, female    DOB: 06-Aug-1956, 58 y.o.   MRN: [id number]  HPI The patient is a 58 year old female who comes in today as a self-referral for evaluation of abnormal sleep. The patient has had significant issues with both sleep onset and maintenance, and is concerned that she may have some type of a sleep disorder. She has been noted to have snoring, but lives alone with no one to comment on an abnormal breathing pattern during sleep. She tells me that she has fairly frequent episodes of awakening "with a startle", but has always blames this on her anxiety. She has frequent awakenings during the night, and is never rested in the mornings upon arising. Even if she sleeps a prolonged period of time she still does not feel rested. She notes definite sleepiness during the day with periods of inactivity, and can fall asleep watching television or movies in the evening. She has some sleep pressure driving longer distances, but does not feel it is overly significant. The patient states that her weight is down 70-80 pounds over the last 2 years, and her Epworth score today is 13.   Sleep Questionnaire What time do you typically go to bed?( Between what hours) 9-10pm 9-10pm at 1114 on 04/26/14 by Lilli Few, CMA How long does it take you to fall asleep? 5 minutes 5 minutes at 1114 on 04/26/14 by Lilli Few, CMA How many times during the night do you wake up? 5 5 at 1114 on 04/26/14 by Lilli Few, CMA What time do you get out of bed to start your day? 0530 0530 at 1114 on 04/26/14 by Lilli Few, CMA Do you drive or operate heavy machinery in your occupation? No No at 1114 on 04/26/14 by Lilli Few, CMA How much has your weight changed (up or down) over the past two years? (In pounds) 80 lb (36.288 kg) 80 lb (36.288 kg) at 1114 on 04/26/14 by Lilli Few, CMA Have you ever had a sleep study before? No No at  1114 on 04/26/14 by Lilli Few, CMA Do you currently use CPAP? No No at 1114 on 04/26/14 by Lilli Few, CMA Do you wear oxygen at any time? No No at 1114 on 04/26/14 by Lilli Few, CMA   Review of Systems  Constitutional: Negative for fever and unexpected weight change.  HENT: Positive for dental problem. Negative for congestion, ear pain, nosebleeds, postnasal drip, rhinorrhea, sinus pressure, sneezing, sore throat and trouble swallowing.   Eyes: Negative for redness and itching.  Respiratory: Negative for cough, chest tightness, shortness of breath and wheezing.   Cardiovascular: Negative for palpitations and leg swelling.  Gastrointestinal: Negative for nausea and vomiting.  Genitourinary: Negative for dysuria.  Musculoskeletal: Negative for joint swelling.  Skin: Negative for rash.  Neurological: Positive for headaches.  Hematological: Does not bruise/bleed easily.  Psychiatric/Behavioral: Positive for dysphoric mood. The patient is nervous/anxious.        Objective:   Physical Exam Constitutional:  Overweight female, no acute distress  HENT:  Nares patent without discharge  Oropharynx without exudate, palate and uvula are moderately elongated  Eyes:  Perrla, eomi, no scleral icterus  Neck:  No JVD, no TMG  Cardiovascular:  Normal rate, regular rhythm, no rubs or gallops.  2/6 sem        Intact distal pulses  Pulmonary :  Normal breath sounds, no stridor or respiratory distress  No rales, rhonchi, or wheezing  Abdominal:  Soft, nondistended, bowel sounds present.  No tenderness noted.   Musculoskeletal:  mild lower extremity edema noted.  Lymph Nodes:  No cervical lymphadenopathy noted  Skin:  No cyanosis noted  Neurologic:  Appears sleepy but appropriate, moves all 4 extremities without obvious deficit.         Assessment & Plan:

## 2014-04-26 NOTE — Patient Instructions (Signed)
Would recommend a sleep study to evaluate your sleep issues.  Will have our patient care coordinators find out if covered by insurance, and what your copay would be. Keep working on weight reduction, you are doing great.

## 2014-04-26 NOTE — Assessment & Plan Note (Signed)
The patient has snoring as well as frequent awakenings at night and nonrestorative sleep. She is also overweight and has an abnormal upper airway. Based on this, I am concerned that she may have sleep disordered breathing, and will need an evaluation for this.

## 2014-04-26 NOTE — Assessment & Plan Note (Signed)
The patient has issues with sleep onset, and as well as frequent awakenings. It is unclear how much of this is related to her anxiety and depression, possibly chronic insomnia, or whether there may be a sleep disorder such as sleep disordered breathing or a movement disorder of sleep. I have recommended that she had a sleep study to evaluate for various causes of awakenings, and to rule out an underlying sleep disorder.

## 2014-05-07 ENCOUNTER — Ambulatory Visit (INDEPENDENT_AMBULATORY_CARE_PROVIDER_SITE_OTHER): Payer: Managed Care, Other (non HMO) | Admitting: Gynecology

## 2014-05-07 ENCOUNTER — Telehealth: Payer: Self-pay | Admitting: *Deleted

## 2014-05-07 ENCOUNTER — Encounter: Payer: Self-pay | Admitting: Gynecology

## 2014-05-07 ENCOUNTER — Other Ambulatory Visit: Payer: Self-pay | Admitting: Gynecology

## 2014-05-07 VITALS — BP 122/60 | HR 84 | Resp 12 | Ht 65.0 in | Wt 206.0 lb

## 2014-05-07 DIAGNOSIS — N951 Menopausal and female climacteric states: Secondary | ICD-10-CM

## 2014-05-07 DIAGNOSIS — E039 Hypothyroidism, unspecified: Secondary | ICD-10-CM

## 2014-05-07 DIAGNOSIS — D649 Anemia, unspecified: Secondary | ICD-10-CM

## 2014-05-07 DIAGNOSIS — Z0000 Encounter for general adult medical examination without abnormal findings: Secondary | ICD-10-CM

## 2014-05-07 DIAGNOSIS — Z01419 Encounter for gynecological examination (general) (routine) without abnormal findings: Secondary | ICD-10-CM

## 2014-05-07 LAB — CBC
HEMATOCRIT: 32.3 % — AB (ref 36.0–46.0)
Hemoglobin: 10.9 g/dL — ABNORMAL LOW (ref 12.0–15.0)
MCH: 29.4 pg (ref 26.0–34.0)
MCHC: 33.7 g/dL (ref 30.0–36.0)
MCV: 87.1 fL (ref 78.0–100.0)
Platelets: 358 10*3/uL (ref 150–400)
RBC: 3.71 MIL/uL — ABNORMAL LOW (ref 3.87–5.11)
RDW: 15.6 % — ABNORMAL HIGH (ref 11.5–15.5)
WBC: 5.4 10*3/uL (ref 4.0–10.5)

## 2014-05-07 LAB — TSH: TSH: 3.505 u[IU]/mL (ref 0.350–4.500)

## 2014-05-07 MED ORDER — ESTRADIOL 0.05 MG/24HR TD PTTW: 1.0000 | MEDICATED_PATCH | TRANSDERMAL | Status: DC

## 2014-05-07 MED ORDER — PROGESTERONE MICRONIZED 100 MG PO CAPS: 100.0000 mg | ORAL_CAPSULE | Freq: Every day | ORAL | Status: DC

## 2014-05-07 MED ORDER — THYROID 120 MG PO TABS: 120.0000 mg | ORAL_TABLET | Freq: Every day | ORAL | Status: DC

## 2014-05-07 NOTE — Telephone Encounter (Signed)
Fax From: [facility]  - Requesting for a 3 month supply of Vivelle Dot 0.05  Last Refilled: 05/07/14 #8 patches  Per Dr. Charlies Constable patient can have #24/0 refills only needs to have mammogram for additional refills.  Vivelle Dot 0.05 mg #24/0 refills sent to [facility].  Routed to provider for review, encounter closed.

## 2014-05-07 NOTE — Progress Notes (Signed)
58 y.o. Single Caucasian female   No obstetric history on file. here for annual exam.   She does not have vaginal dryness.  She is not using lubricants.  She does not report post-menopasual bleeding.  Pt is currently on medical leave due to stress at work, she is doing better in that regard.  Pt was started on abilify and has started gaining weight, she is unhappy about that.  Pt denies vaginal bleeding.  Night sweats controlled with patch.  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  Patient's last menstrual period was 02/15/2013.          Sexually active: No.  The current method of family planning is post menopausal status.    Exercising: Yes.    ellipitical, bike, weight training  4x/wk Last pap: 03/27/13 NEG HR HPV Abnormal PAP: no Mammogram: not sure BSE: no Colonoscopy: Never had one DEXA: never had one Alcohol: no Tobacco: no  Hgb: 10.7 ; Urine: Declined  Health Maintenance  Topic Date Due  . Tetanus/tdap  05/20/1973  . Mammogram  05/20/2004  . Colonoscopy  05/20/2004  . Influenza Vaccine  05/26/2014  . Pap Smear  03/27/2016    Family History  Problem Relation Age of Onset  . Aneurysm Mother   . Kidney disease Mother   . Hypertension Father   . Dementia Father   . Kidney disease Brother   . Heart disease      "all of moms side"  . Heart failure Cousin   . Depression      Patient Active Problem List   Diagnosis Date Noted  . Snoring 04/26/2014  . Persistent disorder of initiating or maintaining sleep 04/26/2014  . Atypical chest pain 03/22/2014  . Medication side effect 02/19/2014  . Adjustment disorder with mixed anxiety and depressed mood 02/19/2014  . Pain of left heel 11/14/2012  . Memory difficulties 11/14/2012  . Stress at work 11/14/2012  . Plantar fasciitis of left foot 11/14/2012  . Neoplasm of uncertain behavior of skin 07/10/2011  . Infection, skin 07/10/2011  . Elevated blood  pressure (not hypertension) 03/16/2011  . Family hx of aortic aneurysm 03/16/2011  . HYPERLIPIDEMIA 07/24/2008  . OBESITY, UNSPECIFIED 07/24/2008  . DYSTHYMIA, CHRONIC 07/24/2008  . PERIMENOPAUSAL SYNDROME 07/24/2008  . FATIGUE 07/24/2008  . HYPOTHYROIDISM 11/29/2007  . GLUCOMA 11/29/2007  . HEADACHE 11/29/2007    Past Medical History  Diagnosis Date  . Hypothyroid   . Glaucoma   . Hyperlipemia   . Migraine   . History of syncope 2008    loss of bladder control eval by Neuro  . Atypical chest pain   . Family history of heart disease     Past Surgical History  Procedure Laterality Date  . Cataract extraction    . Glaucoma surgery    . Examination under anesthesia  02/24/2013    Procedure: EXAM UNDER ANESTHESIA;  Surgeon: Azalia Bilis, MD;  Location: [facility];  Service: Gynecology;;  with removal of prolapsing cervical mass; pap smear and endometrial biopsy    Allergies: Effexor  Current Outpatient Prescriptions  Medication Sig Dispense Refill  . ARIPiprazole (ABILIFY) 10 MG tablet Take 10 mg by mouth daily.      . brimonidine-timolol (COMBIGAN) 0.2-0.5 % ophthalmic solution Place 1 drop into both eyes every 12 (twelve) hours.        . Cholecalciferol (VITAMIN D-3) 5000 UNITS TABS Take 1 tablet by  mouth daily.      . dorzolamide (TRUSOPT) 2 % ophthalmic solution 1 drop 3 (three) times daily.        [name] estradiol (VIVELLE-DOT) 0.05 MG/24HR patch Place 1 patch (0.05 mg total) onto the skin 2 (two) times a week.  8 patch  3  . MELATONIN PO Take by mouth.      . Misc Natural Products (CORTISOL PO) Take by mouth.      . Multiple Vitamin (MULTIVITAMIN WITH MINERALS) TABS Take 1 tablet by mouth daily.      . progesterone (PROMETRIUM) 100 MG capsule Take 1 capsule (100 mg total) by mouth daily.  30 capsule  11  . thyroid (ARMOUR THYROID) 120 MG tablet Take 1 tablet (120 mg total) by mouth daily before breakfast.  90 tablet  0  . Travoprost, BAK Free, (TRAVATAN) 0.004 % SOLN  ophthalmic solution Place 1 drop into both eyes at bedtime.      . traZODone (DESYREL) 50 MG tablet Take 12.5 mg by mouth at bedtime.      [name] UNABLE TO FIND Med Name: Ashwaganda      . UNABLE TO FIND Med Name: Hart Robinsons       No current facility-administered medications for this visit.    ROS: Pertinent items are noted in HPI.  Exam:    Ht 5\' 5"  (1.651 m)  Wt 206 lb (93.441 kg)  BMI 34.28 kg/m2  LMP 02/15/2013 Weight change: @WEIGHTCHANGE @ Last 3 height recordings:  Ht Readings from Last 3 Encounters:  05/07/14 5\' 5"  (1.651 m)  04/26/14 5\' 5"  (1.651 m)  03/22/14 5\' 5"  (1.651 m)   General appearance: alert, cooperative and appears stated age Head: Normocephalic, without obvious abnormality, atraumatic Neck: no adenopathy, no carotid bruit, no JVD, supple, symmetrical, trachea midline and thyroid not enlarged, symmetric, no tenderness/mass/nodules Lungs: clear to auscultation bilaterally Breasts: normal appearance, no masses or tenderness Heart: regular rate and rhythm, S1, S2 normal, no murmur, click, rub or gallop Abdomen: soft, non-tender; bowel sounds normal; no masses,  no organomegaly Extremities: extremities normal, atraumatic, no cyanosis or edema Skin: Skin color, texture, turgor normal. No rashes or lesions Lymph nodes: Cervical, supraclavicular, and axillary nodes normal. no inguinal nodes palpated Neurologic: Grossly normal   Pelvic: External genitalia:  no lesions              Urethra: normal appearing urethra with no masses, tenderness or lesions              Bartholins and Skenes: Bartholin's, Urethra, Skene's normal                 Vagina: normal appearing vagina with normal color and discharge, no lesions              Cervix: normal appearance              Pap taken: No.        Bimanual Exam:  Uterus:  uterus is normal size, shape, consistency and nontender                                      Adnexa:    normal adnexa in size, nontender and no masses  Rectovaginal: Confirms                                      Anus:  normal sphincter tone, no lesions  1. Routine gynecological examination P: counseled on breast self exam, mammography screening, menopause, adequate intake of calcium and vitamin D, diet and exercise return annually or prn Discussed PAP guideline changes, importance of weight bearing exercises, calcium, vit D and balanced diet.   2. Laboratory examination ordered as part of a routine general medical examination  - Hemoglobin, fingerstick  3. Anemia, unspecified anemia type Low Hb on fingerstick, confirm, overdue for baseline colonoscopy-stressed need to follow-up - CBC  4. Menopausal symptom Did not have mammogram- no refills if not done within 87m, stressed need for prometrium to decrease risk of uterine cancer-cannot skip - estradiol (VIVELLE-DOT) 0.05 MG/24HR patch; Place 1 patch (0.05 mg total) onto the skin 2 (two) times a week.  Dispense: 8 patch; Refill: 3 - progesterone (PROMETRIUM) 100 MG capsule; Take 1 capsule (100 mg total) by mouth daily.  Dispense: 30 capsule; Refill: 3   5. HYPOTHYROIDISM Will adjust accordingly. - thyroid (ARMOUR THYROID) 120 MG tablet; Take 1 tablet (120 mg total) by mouth daily before breakfast.  Dispense: 90 tablet; Refill: 0 - TSH  An After Visit Summary was printed and given to the patient.

## 2014-05-08 LAB — IRON AND TIBC
%SAT: 20 % (ref 20–55)
IRON: 68 ug/dL (ref 42–145)
TIBC: 348 ug/dL (ref 250–470)
UIBC: 280 ug/dL (ref 125–400)

## 2014-05-08 LAB — HEMOGLOBIN, FINGERSTICK: Hemoglobin, fingerstick: 10.7 g/dL — ABNORMAL LOW (ref 12.0–16.0)

## 2014-05-10 ENCOUNTER — Other Ambulatory Visit: Payer: Self-pay | Admitting: Gynecology

## 2014-05-10 DIAGNOSIS — Z1231 Encounter for screening mammogram for malignant neoplasm of breast: Secondary | ICD-10-CM

## 2014-05-11 ENCOUNTER — Ambulatory Visit (HOSPITAL_COMMUNITY)
Admission: RE | Admit: 2014-05-11 | Discharge: 2014-05-11 | Disposition: A | Discharge status: Home / Self Care | Payer: Managed Care, Other (non HMO) | Source: Ambulatory Visit | Attending: Gynecology | Admitting: Gynecology

## 2014-05-11 ENCOUNTER — Other Ambulatory Visit: Payer: Self-pay

## 2014-05-11 DIAGNOSIS — Z1231 Encounter for screening mammogram for malignant neoplasm of breast: Secondary | ICD-10-CM | POA: Insufficient documentation

## 2014-05-11 DIAGNOSIS — N951 Menopausal and female climacteric states: Secondary | ICD-10-CM

## 2014-05-11 NOTE — Telephone Encounter (Signed)
Pt is requesting a 90 supply  Please advise

## 2014-05-14 ENCOUNTER — Other Ambulatory Visit: Payer: Self-pay | Admitting: Gynecology

## 2014-05-14 ENCOUNTER — Telehealth: Payer: Self-pay | Admitting: Emergency Medicine

## 2014-05-14 DIAGNOSIS — D509 Iron deficiency anemia, unspecified: Secondary | ICD-10-CM

## 2014-05-14 DIAGNOSIS — R928 Other abnormal and inconclusive findings on diagnostic imaging of breast: Secondary | ICD-10-CM

## 2014-05-14 DIAGNOSIS — E039 Hypothyroidism, unspecified: Secondary | ICD-10-CM

## 2014-05-14 DIAGNOSIS — Z1211 Encounter for screening for malignant neoplasm of colon: Secondary | ICD-10-CM

## 2014-05-14 NOTE — Telephone Encounter (Signed)
Message copied by Michele Mcalpine on Mon May 14, 2014  3:02 PM ------      Message from: Elveria Rising      Created: Tue May 08, 2014 10:44 AM       TSH a little higher than last year, pt confirm pt was taking her dose as prescribed? There was comment that she was not taking due to fear that she would loose her job, if taken correctly, will need increase in dose, may be reason she is feeling fatigued.  Inform cbc is consistent with fingerstick, iron studies added, should be seen by pcp, is overdue for colonoscopy, can make referral ------

## 2014-05-14 NOTE — Telephone Encounter (Signed)
Message left to return call to [place] at [phone] for non urgent message from Dr. Charlies Constable. Needs both messages. Will need referral to colonoscopy and/or pcp.

## 2014-05-14 NOTE — Telephone Encounter (Signed)
Message copied by Michele Mcalpine on Mon May 14, 2014  3:03 PM ------      Message from: Elveria Rising      Created: Wed May 09, 2014  5:07 PM       Pt is anemic, iron is better than last year, I think she should be evaluated by pcp or referred to GI, no h/o colonoscopy ------

## 2014-05-16 ENCOUNTER — Telehealth: Payer: Self-pay | Admitting: Cardiovascular Disease

## 2014-05-16 MED ORDER — PROGESTERONE MICRONIZED 100 MG PO CAPS: 100.0000 mg | ORAL_CAPSULE | Freq: Every day | ORAL | Status: DC

## 2014-05-16 NOTE — Telephone Encounter (Signed)
Pt said Dr Gwenlyn Found recommended a female primary doctor for her,she can not remember her name. Would you please call and let her know who it was.

## 2014-05-16 NOTE — Addendum Note (Signed)
Addended by: Gerda Diss on: 05/16/2014 09:50 AM   Modules accepted: Orders

## 2014-05-16 NOTE — Telephone Encounter (Signed)
Spoke with patient. Advised of both messages from Dr. Charlies Constable.   1) Patient states she has been taking amour thyroid as ordered. She is agreeable to increase but wants to try a supplement first that was recommended by a friend called Nacent Iodine. She still has 120 mg Amour Thyroid tablets at home and wants to finish those up first before taking a new dose. She will call us after she has taken the supplement and would like a recheck of tsh at that time.   2) Patient would like to know if Dr. Charlies Constable can help with anemia issues. She has a pcp but feels he does not listen. Wants to know if Dr. Charlies Constable has recommendations for a "natural provider."  3) She is agreeable to referral to GI for colonoscopy. Referral entered for Dr. Collene Mares and advised she would be contacted.

## 2014-05-16 NOTE — Telephone Encounter (Addendum)
90 supply sent to [facility] per Dr. Donette Larry from [facility] will cancel old rx Encounter closed

## 2014-05-16 NOTE — Telephone Encounter (Signed)
Pt is returning a call to the nurse pt states she has some more questions

## 2014-05-16 NOTE — Telephone Encounter (Signed)
Need to rule out bleeding as a source of her anemia, we will get the f/u for Dr Collene Mares but in the meantime she should take otc iron or if she has problems tolerating it I can call in a more gentle form, may cause some GI upset and constipation.  Iron 2-3x/d as tolerated and can recheck cbc in 6w

## 2014-05-16 NOTE — Telephone Encounter (Signed)
I looked at Dr. Kennon Holter last note and there was no mention of a female PCP , do you know which Dr. Parks Ranger might be?

## 2014-05-16 NOTE — Telephone Encounter (Signed)
I looked at Dr. Kennon Holter last note and there was no mention of a female PCP; do you know who that Dr. Lorain Childes be?

## 2014-05-17 NOTE — Telephone Encounter (Addendum)
Spoke with patient and message from Dr Charlies Constable given. She will try otc iron and call for recheck. She wants to wait for blood draw until she can do both cbc and tsh at the same time.      Dr. Charlies Constable, patient does not want to take Amour Thyroid anymore, she wants to go back to her old medication as she feels that worked better for her. She would like to use Nature Throid at dose of 81.25 mg. She is requesting that you change her rx to nature throid.     1018: Patient called again. She states she called [facility] and was advised if she has colonoscopy as diagnostic testing, it will be $600.00 and if it is screening, it is covered. Patient states she cannot do diagnostic testing. I advised that she would have to discuss that with Dr. Lorie Apley office at the time of her appointment as it is up to their office billing on how procedure is billed.   Patient wondering what next step will be if colonoscopy is negative. I advised patient that she will need to discuss this with Dr. Charlies Constable.

## 2014-05-18 ENCOUNTER — Ambulatory Visit
Admission: RE | Admit: 2014-05-18 | Discharge: 2014-05-18 | Disposition: A | Discharge status: Home / Self Care | Payer: Managed Care, Other (non HMO) | Source: Ambulatory Visit | Attending: Gynecology | Admitting: Gynecology

## 2014-05-18 ENCOUNTER — Ambulatory Visit (HOSPITAL_COMMUNITY): Payer: Managed Care, Other (non HMO)

## 2014-05-18 ENCOUNTER — Other Ambulatory Visit: Payer: Self-pay | Admitting: Gynecology

## 2014-05-18 DIAGNOSIS — R928 Other abnormal and inconclusive findings on diagnostic imaging of breast: Secondary | ICD-10-CM

## 2014-05-18 NOTE — Telephone Encounter (Signed)
Spoke with patient and gave her the needed infor

## 2014-05-18 NOTE — Telephone Encounter (Signed)
i expect since she never had a colonoscopy it would be screening, it is Dr Lorie Apley cal, I have to look into nature thyroid before I can change

## 2014-05-21 ENCOUNTER — Institutional Professional Consult (permissible substitution): Payer: Managed Care, Other (non HMO) | Admitting: Pulmonary Disease

## 2014-05-24 ENCOUNTER — Ambulatory Visit
Admission: RE | Admit: 2014-05-24 | Discharge: 2014-05-24 | Disposition: A | Discharge status: Home / Self Care | Payer: Managed Care, Other (non HMO) | Source: Ambulatory Visit | Attending: Gynecology | Admitting: Gynecology

## 2014-05-24 DIAGNOSIS — R928 Other abnormal and inconclusive findings on diagnostic imaging of breast: Secondary | ICD-10-CM

## 2014-05-24 HISTORY — PX: BREAST BIOPSY: SHX20

## 2014-05-25 ENCOUNTER — Other Ambulatory Visit: Payer: Self-pay | Admitting: Gynecology

## 2014-05-25 ENCOUNTER — Telehealth: Payer: Self-pay | Admitting: Gynecology

## 2014-05-25 DIAGNOSIS — D0512 Intraductal carcinoma in situ of left breast: Secondary | ICD-10-CM

## 2014-05-25 NOTE — Telephone Encounter (Signed)
Pt s/p FNA of breast mass, results back today. Differential considerations include papillary ductal carcinoma in situ and ductal carcinoma in situ involving papillary lesion. There is a solitary focus of malignant epithelium with features highly suspicious for lymphovascular invasion Called to given pt emotional support.   Recommend she stop her vivelle dot and promtrium.  Pt agrees, we can address her hot flashes in future, she will discuss non-hormonal options that may help also with her depression with her therapist. Support given

## 2014-05-25 NOTE — Telephone Encounter (Signed)
MRI scheduled for 05/30/14, surgical consult 06/06/14

## 2014-05-26 DIAGNOSIS — C50919 Malignant neoplasm of unspecified site of unspecified female breast: Secondary | ICD-10-CM

## 2014-05-26 HISTORY — DX: Malignant neoplasm of unspecified site of unspecified female breast: C50.919

## 2014-05-28 ENCOUNTER — Telehealth: Payer: Self-pay | Admitting: *Deleted

## 2014-05-28 DIAGNOSIS — C50412 Malignant neoplasm of upper-outer quadrant of left female breast: Secondary | ICD-10-CM | POA: Insufficient documentation

## 2014-05-28 NOTE — Telephone Encounter (Signed)
Pt would like to speak with Dr Charlies Constable about her diagnosis from Friday.

## 2014-05-28 NOTE — Telephone Encounter (Signed)
Dr. Charlies Constable,  Patient requesting phone call from you.   I called her to advise you were not in the office at this time and she is aware. Advised I would send you this message.

## 2014-05-28 NOTE — Telephone Encounter (Signed)
Confirmed [facility] for 06/16/14 at 12N .  Instructions and contact information given.

## 2014-05-29 MED ORDER — THYROID 180 MG PO TABS: 180.0000 mg | ORAL_TABLET | Freq: Every day | ORAL | Status: DC

## 2014-05-29 NOTE — Telephone Encounter (Signed)
Pt was ok with continuing armour thyroid, will take 1.5 per day until she runs out and then will call in 180's.  Pt can link TSH to future blood work related to her recent breast cancer diagnosis.  Pt was started abilify by psychiatrist.  Pt is anxious regarding waiting for her colonoscopy 9/11 as she will be dealing with her breast cancer, she will call to see if it can be moved up.  We reviewed her pathology again with her per her request, she is aware that staging and prognosis will be based on lumpectomy and MRI. Questions addressed.  support given. Pt requests that we fill out the initial extension of leave as she has not been seen by general surgery.

## 2014-05-30 ENCOUNTER — Ambulatory Visit
Admission: RE | Admit: 2014-05-30 | Discharge: 2014-05-30 | Disposition: A | Discharge status: Home / Self Care | Payer: Managed Care, Other (non HMO) | Source: Ambulatory Visit | Attending: Gynecology | Admitting: Gynecology

## 2014-05-30 ENCOUNTER — Telehealth: Payer: Self-pay | Admitting: Gynecology

## 2014-05-30 DIAGNOSIS — D0512 Intraductal carcinoma in situ of left breast: Secondary | ICD-10-CM

## 2014-05-30 MED ORDER — GADOBENATE DIMEGLUMINE 529 MG/ML IV SOLN
19.0000 mL | Freq: Once | INTRAVENOUS | Status: AC | PRN
  Administered 2014-05-30: 19 mL via INTRAVENOUS

## 2014-05-30 NOTE — Telephone Encounter (Signed)
Informed that we cannot extend her disability she should see if psychiatrist will extend her until she can be evaluated by general surgery.  Pt understands and appreciates our efforts

## 2014-05-30 NOTE — Telephone Encounter (Signed)
Pt is calling to let Dr. Charlies Constable know she scheduled her colonoscopy with Dr. Collene Mares for this Friday.

## 2014-05-31 ENCOUNTER — Telehealth: Payer: Self-pay | Admitting: Gynecology

## 2014-05-31 NOTE — Telephone Encounter (Signed)
Message left to return call to Kimberly Reed at [phone].    

## 2014-05-31 NOTE — Telephone Encounter (Addendum)
Patient returned call. She states she is requesting to talk with Dr. Charlies Constable directly.  I advised that Dr. Charlies Constable is not in the office today but I would be happy to send her a message. Patient states she would rather talk with Dr. Charlies Constable directly. I advised I would send her the message with her request. Patient states she will talk to her when she can and disconnected the line.

## 2014-05-31 NOTE — Telephone Encounter (Signed)
Pt would like someone to call her about her "situation" she has going on.

## 2014-06-01 ENCOUNTER — Telehealth: Payer: Self-pay | Admitting: Medical

## 2014-06-01 HISTORY — PX: ESOPHAGOGASTRODUODENOSCOPY: SHX1529

## 2014-06-01 LAB — HM COLONOSCOPY

## 2014-06-01 NOTE — Telephone Encounter (Signed)
Dr. Collene Mares from GI called and said pt was there for colonoscopy but was seemingly having a psychotic episode, causing uproar in their office.    She contacted [place] since we referred.  I in turn called her psychiatry office and spoke to PA who manages her medications, [facility].  He was advised of the episode and will contact Dr. Lorie Apley office.     Dr. Collene Mares advised that given the problems they had with her today, she could not return to their clinic after today.

## 2014-06-06 ENCOUNTER — Encounter: Payer: Self-pay | Admitting: *Deleted

## 2014-06-06 ENCOUNTER — Encounter: Payer: Self-pay | Admitting: Hematology and Oncology

## 2014-06-06 ENCOUNTER — Ambulatory Visit: Payer: Managed Care, Other (non HMO)

## 2014-06-06 ENCOUNTER — Ambulatory Visit
Admission: RE | Admit: 2014-06-06 | Discharge: 2014-06-06 | Disposition: A | Discharge status: Home / Self Care | Payer: Managed Care, Other (non HMO) | Source: Ambulatory Visit | Attending: Radiation Oncology | Admitting: Radiation Oncology

## 2014-06-06 ENCOUNTER — Encounter: Payer: Self-pay | Admitting: Medical

## 2014-06-06 ENCOUNTER — Ambulatory Visit (HOSPITAL_BASED_OUTPATIENT_CLINIC_OR_DEPARTMENT_OTHER): Payer: Managed Care, Other (non HMO) | Admitting: General Surgery

## 2014-06-06 ENCOUNTER — Other Ambulatory Visit (HOSPITAL_BASED_OUTPATIENT_CLINIC_OR_DEPARTMENT_OTHER): Payer: Managed Care, Other (non HMO)

## 2014-06-06 ENCOUNTER — Ambulatory Visit (HOSPITAL_BASED_OUTPATIENT_CLINIC_OR_DEPARTMENT_OTHER): Payer: Managed Care, Other (non HMO) | Admitting: Hematology and Oncology

## 2014-06-06 ENCOUNTER — Ambulatory Visit: Payer: Managed Care, Other (non HMO) | Attending: General Surgery | Admitting: Physical Therapy

## 2014-06-06 ENCOUNTER — Other Ambulatory Visit (INDEPENDENT_AMBULATORY_CARE_PROVIDER_SITE_OTHER): Payer: Self-pay | Admitting: General Surgery

## 2014-06-06 VITALS — BP 112/55 | HR 57 | Temp 98.5°F | Resp 20 | Ht 65.0 in | Wt 198.2 lb

## 2014-06-06 DIAGNOSIS — R928 Other abnormal and inconclusive findings on diagnostic imaging of breast: Secondary | ICD-10-CM

## 2014-06-06 DIAGNOSIS — C50419 Malignant neoplasm of upper-outer quadrant of unspecified female breast: Secondary | ICD-10-CM

## 2014-06-06 DIAGNOSIS — K7689 Other specified diseases of liver: Secondary | ICD-10-CM

## 2014-06-06 DIAGNOSIS — R293 Abnormal posture: Secondary | ICD-10-CM | POA: Insufficient documentation

## 2014-06-06 DIAGNOSIS — IMO0001 Reserved for inherently not codable concepts without codable children: Secondary | ICD-10-CM | POA: Insufficient documentation

## 2014-06-06 DIAGNOSIS — C50919 Malignant neoplasm of unspecified site of unspecified female breast: Secondary | ICD-10-CM | POA: Insufficient documentation

## 2014-06-06 DIAGNOSIS — C50412 Malignant neoplasm of upper-outer quadrant of left female breast: Secondary | ICD-10-CM

## 2014-06-06 DIAGNOSIS — Z170 Estrogen receptor positive status [ER+]: Secondary | ICD-10-CM

## 2014-06-06 DIAGNOSIS — F329 Major depressive disorder, single episode, unspecified: Secondary | ICD-10-CM

## 2014-06-06 DIAGNOSIS — F3289 Other specified depressive episodes: Secondary | ICD-10-CM

## 2014-06-06 DIAGNOSIS — F32A Depression, unspecified: Secondary | ICD-10-CM | POA: Insufficient documentation

## 2014-06-06 LAB — CBC WITH DIFFERENTIAL/PLATELET
BASO%: 0.6 % (ref 0.0–2.0)
Basophils Absolute: 0 10*3/uL (ref 0.0–0.1)
EOS%: 4.6 % (ref 0.0–7.0)
Eosinophils Absolute: 0.3 10*3/uL (ref 0.0–0.5)
HCT: 32.7 % — ABNORMAL LOW (ref 34.8–46.6)
HGB: 10.6 g/dL — ABNORMAL LOW (ref 11.6–15.9)
LYMPH%: 33.5 % (ref 14.0–49.7)
MCH: 29.6 pg (ref 25.1–34.0)
MCHC: 32.5 g/dL (ref 31.5–36.0)
MCV: 91.2 fL (ref 79.5–101.0)
MONO#: 0.5 10*3/uL (ref 0.1–0.9)
MONO%: 9.9 % (ref 0.0–14.0)
NEUT#: 2.8 10*3/uL (ref 1.5–6.5)
NEUT%: 51.4 % (ref 38.4–76.8)
PLATELETS: 292 10*3/uL (ref 145–400)
RBC: 3.58 10*6/uL — AB (ref 3.70–5.45)
RDW: 14.1 % (ref 11.2–14.5)
WBC: 5.5 10*3/uL (ref 3.9–10.3)
lymph#: 1.8 10*3/uL (ref 0.9–3.3)

## 2014-06-06 LAB — COMPREHENSIVE METABOLIC PANEL
ALT: 12 U/L (ref 0–35)
AST: 14 U/L (ref 0–37)
Albumin: 3.7 g/dL (ref 3.5–5.2)
Alkaline Phosphatase: 53 U/L (ref 39–117)
BILIRUBIN TOTAL: 0.3 mg/dL (ref 0.2–1.2)
BUN: 10 mg/dL (ref 6–23)
CO2: 27 mEq/L (ref 19–32)
Calcium: 10.2 mg/dL (ref 8.4–10.5)
Chloride: 103 mEq/L (ref 96–112)
Creatinine, Ser: 0.68 mg/dL (ref 0.50–1.10)
Glucose, Bld: 106 mg/dL — ABNORMAL HIGH (ref 70–99)
Potassium: 4.4 mEq/L (ref 3.5–5.3)
SODIUM: 140 meq/L (ref 135–145)
Total Protein: 7.4 g/dL (ref 6.0–8.3)

## 2014-06-06 NOTE — Assessment & Plan Note (Signed)
Patient has major depression issues she was tearful throughout the appointment. I discussed with her that she will need to see her psychiatrist as necessary medications we need to be adjusted. She will make an appointment. In the meantime I requested a Education officer, museum and case manager to assist her in her financial and emotional needs. For the time being she will continue Abilify. I did a lot of counseling and recommended that she join support groups.

## 2014-06-06 NOTE — Progress Notes (Signed)
Chief Complaint: New diagnosis of breast cancer  History:    Kimberly Reed is a 58 y.o. postmenopausal female referred by Dr. 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 She recently presented for a screening mamogram revealing Abnormal calcifications in the left breast..  Subsequent imaging included diagnostic mamogram showing A 5 cm segmental area of coarse pleomorphic calcifications in the upper outer quadrant of the left breast.    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 There was however noted a few areas of apparent lymphatic invasion and  Some concern is raised regarding invasive disease. Subsequent breast MRI was performed revealing a poorly defined area of enhancement corresponding to the location of the calcifications measuring 21 x 30 mm. In addition were noted numerous enlarged level I and level II left axillary lymph nodes with severe cortical thickening. Also noted were some hyperintense liver lesions which could not be fully characterized.She is seen now in Breast multidisciplinary clinic for initial treatment planning.  She has experienced Some breast discomfort since the biopsy..  She does not have a personal history of any previous breast problems.  Findings at that time were the following:  Tumor size: 5 cm  Tumor grade: 2  Estrogen Receptor: positive Progesterone Receptor: positive    Lymph node status: Abnormal lymph nodes noted on MRI Neurovascular invasion: no Lymphatic invasion: yes  Past Medical History  Diagnosis Date  . Hypothyroid   . Glaucoma   . Hyperlipemia   . Migraine without aura   . History of syncope 2008    loss of bladder control eval by Neuro  . Atypical chest pain   . Family history of heart disease   . Anxiety   . Depression   . Hot flashes     Past Surgical History  Procedure Laterality Date  . Cataract extraction    . Glaucoma  surgery    . Examination under anesthesia  02/24/2013    Procedure: EXAM UNDER ANESTHESIA;  Surgeon: Azalia Bilis, MD;  Location: [facility];  Service: Gynecology;;  with removal of prolapsing cervical mass; pap smear and endometrial biopsy  . Uterine polyp removed      Current Outpatient Prescriptions  Medication Sig Dispense Refill  . ARIPiprazole (ABILIFY) 10 MG tablet Take 10 mg by mouth daily.      . brimonidine-timolol (COMBIGAN) 0.2-0.5 % ophthalmic solution Place 1 drop into both eyes every 12 (twelve) hours.        . Cholecalciferol (VITAMIN D-3) 5000 UNITS TABS Take 1 tablet by mouth daily.      . dorzolamide (TRUSOPT) 2 % ophthalmic solution 1 drop 3 (three) times daily.        [name] MELATONIN PO Take by mouth.      . Multiple Vitamin (MULTIVITAMIN WITH MINERALS) TABS Take 1 tablet by mouth daily.      [name] thyroid (ARMOUR THYROID) 180 MG tablet Take 1 tablet (180 mg total) by mouth daily before breakfast.  90 tablet  2  . Travoprost, BAK Free, (TRAVATAN) 0.004 % SOLN ophthalmic solution Place 1 drop into both eyes at bedtime.      . traZODone (DESYREL) 50 MG tablet Take 12.5 mg by mouth at bedtime as needed.       [name] UNABLE TO FIND Med Name: Ashwaganda      . UNABLE TO FIND Med Name: Hart Robinsons  Family History  Problem Relation Age of Onset  . Aneurysm Mother   . Kidney disease Mother   . Hypertension Father   . Dementia Father   . Kidney disease Brother   . Heart disease      "all of moms side"  . Heart failure Cousin   . Depression      History   Social History  . Marital Status: Single    Spouse Name: N/A    Number of Children: N/A  . 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 Smoking status: Never Smoker   . Smokeless tobacco: Never Used  . Alcohol Use: No  . Drug Use: No     Comment: in 20's but not now  . Sexual Activity: Yes    Partners: Female   Other Topics Concern  .  Not on file   Social History Narrative   Social :  [place]      For over a year.     [place] 1  2 cats    No tad    8 hours of sleep.            Review of Systems Constitutional: positive for fatigue and night sweats Ears, nose, mouth, throat, and face: positive for nasal congestion and sinus drainage Respiratory: negative for cough and dyspnea on exertion Cardiovascular: negative Gastrointestinal: positive for reflux symptoms Musculoskeletal:negative Neurological: positive for dizziness Behavioral/Psych: positive for anxiety, depression, mood swings and sleep disturbance     Objective:  LMP 02/15/2013  General: Alert, well-developed Caucasian female, Quite anxious, flat affect Skin: Warm and dry without rash or infection. HEENT: No palpable masses or thyromegaly. Sclera nonicteric. Pupils equal round and reactive. Oropharynx clear. Breasts: In the upper outer quadrant of the left breast is a somewhat soft and indistinct mass which feels to measure about 5 cm in greatest dimension anterior to posterior. There is at least one clearly enlarged firm lymph node which is movable palpable in the left axilla. Right breast and axilla are negative. Lymph nodes: No cervical, supraclavicular, or inguinal nodes palpable. Lungs: Breath sounds clear and equal without increased work of breathing Cardiovascular: Regular rate and rhythm without murmur. No JVD or edema. Peripheral pulses intact. Abdomen: Nondistended. Soft and nontender. No masses palpable. No organomegaly. No palpable hernias. Extremities: No edema or joint swelling or deformity. No chronic venous stasis changes. Neurologic: Alert and fully oriented. Flat affect. Very anxious.   Laboratory data:  CBC:  Lab Results  Component Value Date   WBC 5.5 06/06/2014   WBC 5.4 05/07/2014   RBC 3.58* 06/06/2014   RBC 3.71* 05/07/2014   HGB 10.6* 06/06/2014   HGB 10.9* 05/07/2014   HCT 32.7* 06/06/2014   HCT 32.3* 05/07/2014    PLT 292 06/06/2014   PLT 358 05/07/2014  ]  [facility] Labs:  Lab Results  Component Value Date   NA 140 06/06/2014   K 4.4 06/06/2014   CL 103 06/06/2014   CO2 27 06/06/2014   BUN 10 06/06/2014   CREATININE 0.68 06/06/2014   CREATININE 0.73 03/05/2014   CALCIUM 10.2 06/06/2014   PROT 7.4 06/06/2014   BILITOT 0.3 06/06/2014   BILIDIR 0.0 03/16/2011   ALKPHOS 53 06/06/2014   AST 14 06/06/2014   ALT 12 06/06/2014     Assessment  58 y.o. female with a new diagnosis of cancer of the the left  breast upper outer quadrant.  Clinical 0, estrogen receptor positive and progesterone receptor positive. I discussed with the patient and family members present today initial surgical treatment options.  Her workup to this point is somewhat discordant biopsy showing only ductal carcinoma in situ. However there was some evidence of lymphatic invasion and she has clinically very suspicious lymph nodes on MRI and on physical exam. I discussed with the patient possible initial surgical treatment options. I think the next step would depend on lymph node status and I think obtaining tissue from her clinically abnormal lymph node would help sort things out. The patient strongly desires breast conservation if possible. If her lymph node biopsy were negative I told her we could proceed with attempted lumpectomy with bracketing of her primary tumor and sentinel lymph node biopsy and removal of any grossly abnormal lymph nodes. However if her lymph nodes were positive on biopsy and adjuvant chemotherapy were indicated I would lean toward neoadjuvant treatment with the possibility of at least some shrinking of her primary tumor making a lumpectomy more feasible. We did discuss that there might be some cosmetic deformity because of the size of her tumor and that positive margins could lead to further surgery up to and including mastectomy. We discussed the treatment of breast cancer in general including systemic treatment and radiation  treatment as well. We will plan to await the biopsy of her axillary lymph nodes and then see her back in the office for  Plan Await further workup with biopsy of left axillary adenopathy  Edward Jolly MD, FACS  06/06/2014, 3:21 PM

## 2014-06-06 NOTE — Progress Notes (Signed)
[place] CONSULT NOTE  Patient Care Team: Rachell Cipro, MD as PCP - General (Family Medicine) [facility]  Rulon Eisenmenger, MD as Consulting Physician (Hematology and Oncology) Edward Jolly, MD as Consulting Physician (General Surgery) Eppie Gibson, MD as Attending Physician (Radiation Oncology)  CHIEF COMPLAINTS/PURPOSE OF CONSULTATION:  New diagnosis of breast cancer  HISTORY OF PRESENTING ILLNESS:  Kimberly Reed 58 y.o. Caucasian female is here because of recent diagnosis of a left-sided breast cancer. She had a routine screening mammogram that detected calcifications to were 5 cm on mammogram done on 05/16/2014 this was followed by needle biopsy on 05/24/2014 that revealed low to intermediate grade DCIS  with papillary features but had lymphatic invasion. ER 100% positive PR 84% positive HER-2 was not done. She also had some abnormalities in the liver that are not entirely clear. She was presented this morning after multidisciplinary tumor board and she is here today to discuss treatment plan. She is accompanied by her friend as she does not have any close family members. She was tearful, frightful, anxious during the whole appointment. She was interested in breast conservation. She denies any breast discomfort or nausea or vomiting or weight loss she was seen by a psychiatrist for her depression and she is currently on Abilify but she is unhappy with the psychiatrist and plans to see a new psychiatrist soon.   I reviewed her records extensively and collaborated the history with the patient.  SUMMARY OF ONCOLOGIC HISTORY:   Breast cancer of upper-outer quadrant of left female breast   05/18/2014 Mammogram IMPRESSION: Suspicious left upper outer quadrant 5 cm segmental area of calcifications.    05/24/2014 Pathology Results Estrogen Receptor: 100%, POSITIVE, STRONG STAINING INTENSITY Progesterone Receptor: 84%, POSITIVE, STRONG STAINING INTENSITY Breast, left,  needle core biopsy, upper outer anterior - AT LEAST DUCTAL CARCINOMA IN SITU WITH PAPILLARY FEATURES  - CA   05/24/2014 Initial Biopsy Left Breast: DCIS with papillary features ER 100%, PR 84%.   05/28/2014 Initial Diagnosis Breast cancer of upper-outer quadrant of left female breast   05/30/2014 Breast MRI Left Breast: 12oclock: 21 x 23 x 30 mm, Numerous enl level 1 and 2 LN; Liver lesions?    In terms of breast cancer risk profile:  She menarched at early age of  73  She had  0  pregnancies She was never exposed to fertility medications or hormone replacement therapy.   MEDICAL HISTORY:  Past Medical History  Diagnosis Date  . Hypothyroid   . Glaucoma   . Hyperlipemia   . Migraine without aura   . History of syncope 2008    loss of bladder control eval by Neuro  . Atypical chest pain   . Family history of heart disease   . Anxiety   . Depression   . Hot flashes     SURGICAL HISTORY: Past Surgical History  Procedure Laterality Date  . Cataract extraction    . Glaucoma surgery    . Examination under anesthesia  02/24/2013    Procedure: EXAM UNDER ANESTHESIA;  Surgeon: Azalia Bilis, MD;  Location: [facility];  Service: Gynecology;;  with removal of prolapsing cervical mass; pap smear and endometrial biopsy  . Uterine polyp removed      SOCIAL HISTORY: History   Social History  . Marital Status: Single    Spouse Name: N/A    Number of Children: N/A  . 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 Smoking status: Never Smoker   . Smokeless tobacco: Never Used  . Alcohol Use: No  . Drug Use: No     Comment: in 20's but not now  . Sexual Activity: Yes    Partners: Female   Other Topics Concern  . Not on file   Social History Narrative   Social :  [place]      For over a year.     [place] 1  2 cats    No tad    8 hours of sleep.           FAMILY HISTORY: Family History  Problem  Relation Age of Onset  . Aneurysm Mother   . Kidney disease Mother   . Hypertension Father   . Dementia Father   . Kidney disease Brother   . Heart disease      "all of moms side"  . Heart failure Cousin   . Depression      ALLERGIES:  is allergic to other and effexor.  MEDICATIONS:  Current Outpatient Prescriptions  Medication Sig Dispense Refill  . ARIPiprazole (ABILIFY) 10 MG tablet Take 10 mg by mouth daily.      . brimonidine-timolol (COMBIGAN) 0.2-0.5 % ophthalmic solution Place 1 drop into both eyes every 12 (twelve) hours.        . Cholecalciferol (VITAMIN D-3) 5000 UNITS TABS Take 1 tablet by mouth daily.      . dorzolamide (TRUSOPT) 2 % ophthalmic solution 1 drop 3 (three) times daily.        [name] MELATONIN PO Take by mouth.      . Multiple Vitamin (MULTIVITAMIN WITH MINERALS) TABS Take 1 tablet by mouth daily.      [name] thyroid (ARMOUR THYROID) 180 MG tablet Take 1 tablet (180 mg total) by mouth daily before breakfast.  90 tablet  2  . Travoprost, BAK Free, (TRAVATAN) 0.004 % SOLN ophthalmic solution Place 1 drop into both eyes at bedtime.      [name] UNABLE TO FIND Med Name: Ashwaganda      . UNABLE TO FIND Med Name: Hart Robinsons      . traZODone (DESYREL) 50 MG tablet Take 12.5 mg by mouth at bedtime as needed.        No current facility-administered medications for this visit.    REVIEW OF SYSTEMS:   Constitutional: Denies fevers, chills or abnormal night sweats Eyes: Denies blurriness of vision, double vision or watery eyes Ears, nose, mouth, throat, and face: Denies mucositis or sore throat Respiratory: Denies cough, dyspnea or wheezes Cardiovascular: Denies palpitation, chest discomfort or lower extremity swelling Gastrointestinal:  Denies nausea, heartburn or change in bowel habits Skin: Denies abnormal skin rashes Lymphatics: Denies new lymphadenopathy or easy bruising Neurological:Denies numbness, tingling or new weaknesses Behavioral/Psych: Mood is stable, no new  changes  All other systems were reviewed with the patient and are negative.  PHYSICAL EXAMINATION: ECOG PERFORMANCE STATUS: 1 - Symptomatic but completely ambulatory  Filed Vitals:   06/06/14 1239  BP: 112/55  Pulse: 57  Temp: 98.5 F (36.9 C)  Resp: 20   Filed Weights   06/06/14 1239  Weight: 198 lb 3.2 oz (89.903 kg)    GENERAL:alert, no distress and comfortable SKIN: skin color, texture, turgor are normal, no rashes or significant lesions EYES: normal, conjunctiva are pink and non-injected, sclera clear OROPHARYNX:no exudate, no erythema and lips, buccal mucosa, and tongue normal  NECK: supple, thyroid normal size, non-tender, without nodularity LYMPH:  no palpable lymphadenopathy in the cervical, axillary or inguinal LUNGS: clear to auscultation and percussion with normal breathing effort HEART: regular rate & rhythm and no murmurs and no lower extremity edema ABDOMEN:abdomen soft, non-tender and normal bowel sounds Musculoskeletal:no cyanosis of digits and no clubbing  PSYCH: alert & oriented x 3 with fluent speech NEURO: no focal motor/sensory deficits  LABORATORY DATA:  I have reviewed the data as listed Lab Results  Component Value Date   WBC 5.5 06/06/2014   HGB 10.6* 06/06/2014   HCT 32.7* 06/06/2014   MCV 91.2 06/06/2014   PLT 292 06/06/2014   Lab Results  Component Value Date   NA 140 06/06/2014   K 4.4 06/06/2014   CL 103 06/06/2014   CO2 27 06/06/2014    RADIOGRAPHIC STUDIES: I have personally reviewed the radiological reports and agreed with the findings in the report.   ASSESSMENT AND PLAN:  Breast cancer of upper-outer quadrant of left female breast 1. 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 I discussed the entire pathology report with her in great detail explaining the significance of estrogen and progesterone receptors. The multidisciplinary tumor  Board recommended biopsy of the axillary lymph nodes. I would like to see her back after the lymph node biopsy so that we can discuss the role of neoadjuvant systemic chemotherapy. she would also need staging PET/CT and an MRI of the liver to further evaluate these lesions.  2. I was able to briefly describe chemotherapy but we did not go into detail because she is overwhelmed with all the information he provided so far. On her return visit I would like to discuss this in more detail and counseled her extensively. If she is HER-2 negative I will give her systemic chemotherapy with Adriamycin Cytoxan dose dense followed by weekly Taxol x12. If she is HER-2 positive I would give her TCHP.  3. I requested financial counselor states managers and social workers to assess her with her stress and family and social issues she remarked that she might be losing her job and might lose her insurance as well I reassured her that we will do everything we can to get her the treatment that she needs. Unfortunately there is not much family support.  4. I discussed with her that she needs to be physically mentally and emotionally strong to go through these treatments and having a support group is essential. I reassured her that even though her breast cancer it appears to be extensive it is curable.  5. Dizziness: This has been going on for the last 2 months. This has not been associated with any other focal neurological changes but to complete workup I would like to obtain an MRI of the brain.  6. I will request port placement, echocardiogram of the heart in anticipation of neoadjuvant chemotherapy.  Depression Patient has major depression issues she was tearful throughout the appointment. I discussed with her that she will need to see her psychiatrist as necessary medications we need to be adjusted. She will make an appointment. In the meantime I requested a Education officer, museum and case manager to assist her in her financial  and emotional needs. For the time being she will continue Abilify. I did a lot of counseling and recommended that she join support groups.  All questions were answered. The patient knows  to call the clinic with any problems, questions or concerns. I spent 40 minutes counseling the patient face to face. The total time spent in the appointment was 60 minutes and more than 50% was on counseling.     Rulon Eisenmenger, MD 06/06/2014 5:04 PM

## 2014-06-06 NOTE — Progress Notes (Signed)
Checked in new patient with no financial issues prior to seeing the dr. She has appt card and has not been out of the country. I gave her [facility] packet.

## 2014-06-06 NOTE — Assessment & Plan Note (Addendum)
1. 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 I discussed the entire pathology report with her in great detail explaining the significance of estrogen and progesterone receptors. The multidisciplinary tumor Board recommended biopsy of the axillary lymph nodes. I would like to see her back after the lymph node biopsy so that we can discuss the role of neoadjuvant systemic chemotherapy. she would also need staging PET/CT and an MRI of the liver to further evaluate these lesions.  2. I was able to briefly describe therapy but we did not go into detail because she is overwhelmed with all the information he provided so far. On her return visit I would like to discuss this in more detail and counseled her extensively. If she is HER-2 negative I will give her systemic chemotherapy with Adriamycin Cytoxan dose dense followed by weekly Taxol x12. If she is HER-2 positive I would give her TCHP.  3. I requested financial counselor states managers and social workers to assess her with her stress and family and social issues she remarked that she might be losing her job and might lose her insurance as well I reassured her that we will do everything we can to get her the treatment that she needs. Unfortunately there is not much family support.  4. I discussed with her that she needs to be physically mentally and emotionally strong to go through these treatments and having a support group is essential. I reassured her that even though her breast cancer it appears to be extensive it is curable.

## 2014-06-06 NOTE — Progress Notes (Signed)
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 female who is  In breast clinic today after a screening mammogram found 5cm span of calcifications in the left breast.  Prior mammogram was a long time ago.  MRI of breasts showed a 3.0cm enhancing region on L breast and L axillary LNs of suspicion.  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  Biopsy of axilla pending. Incidental liver lesions seen on breast MRI - indeterminate.  LIVER MRI is pending.  She is very tearful today and distressed by her diagnosis.  She is currently alone in the clinic, but a friend was here, earlier.  PREVIOUS RADIATION THERAPY: not discussed today  PAST MEDICAL HISTORY:  has a past medical history of Hypothyroid; Glaucoma; Hyperlipemia; Migraine without aura; History of syncope (2008); Atypical chest pain; Family history of heart disease; Anxiety; Depression; and Hot flashes.    PAST SURGICAL HISTORY: Past Surgical History  Procedure Laterality Date  . Cataract extraction    . Glaucoma surgery    . Examination under anesthesia  02/24/2013    Procedure: EXAM UNDER ANESTHESIA;  Surgeon: Azalia Bilis, MD;  Location: [facility];  Service: Gynecology;;  with removal of prolapsing cervical mass; pap smear and endometrial biopsy  . Uterine polyp removed      FAMILY HISTORY: family history includes Aneurysm in her mother; Dementia in her father; Depression in an other family member; Heart disease in an other family member; Heart failure in her cousin; Hypertension in her father; Kidney disease  in her brother and mother.  SOCIAL HISTORY:  reports that she has never smoked. She has never used smokeless tobacco. She reports that she does not drink alcohol or use illicit drugs.  ALLERGIES: Other and Effexor  MEDICATIONS:  Current Outpatient Prescriptions  Medication Sig Dispense Refill  . ARIPiprazole (ABILIFY) 10 MG tablet Take 10 mg by mouth daily.      . brimonidine-timolol (COMBIGAN) 0.2-0.5 % ophthalmic solution Place 1 drop into both eyes every 12 (twelve) hours.        . Cholecalciferol (VITAMIN D-3) 5000 UNITS TABS Take 1 tablet by mouth daily.      . dorzolamide (TRUSOPT) 2 % ophthalmic solution 1 drop 3 (three) times daily.        [name] MELATONIN PO Take by mouth.      . Multiple Vitamin (MULTIVITAMIN WITH MINERALS) TABS Take 1 tablet by mouth daily.      [name] thyroid (ARMOUR THYROID) 180 MG tablet Take 1 tablet (180 mg total) by mouth daily before breakfast.  90 tablet  2  . Travoprost, BAK Free, (TRAVATAN) 0.004 % SOLN ophthalmic solution Place 1 drop into both eyes at bedtime.      . traZODone (DESYREL) 50 MG tablet Take 12.5 mg by mouth at bedtime as needed.       [name] UNABLE TO FIND Med Name: Ashwaganda      . UNABLE TO FIND Med Name: Hart Robinsons       No current facility-administered medications for this encounter.    REVIEW OF SYSTEMS:  Notable for that above.  PHYSICAL EXAM:   Vitals with BMI 06/06/2014  Height 5\' 5"   Weight 198 lbs 3 oz  BMI 22.2  Systolic 979  Diastolic 55  Pulse 57  Respirations 20  General: Alert and oriented, tearful, poor eye contact HEENT: Head is normocephalic. Pupils are equally round and reactive to light. Extraocular movements are intact. Oropharynx is clear. Neck: Neck is supple, no palpable cervical or supraclavicular lymphadenopathy. Heart: Regular in rate and rhythm with no murmurs, rubs, or gallops. Chest: Clear to auscultation bilaterally, with no rhonchi, wheezes, or rales. Abdomen: Soft, nontender, nondistended, with no  rigidity or guarding. Extremities: No cyanosis or edema. Lymphatics: No concerning lymphadenopathy. Skin: No concerning lesions. Musculoskeletal: symmetric strength and muscle tone throughout. Neurologic: Cranial nerves II through XII are grossly intact. No obvious focalities. Speech is fluent. Coordination is intact. Psychiatric: Affect is flat. Breasts: left breast notable for a hard central breast mass, approximately 3-4 cm.  At least 2 hard axillary nodes appreciated on left. Right side unremarkable.   ECOG = 1  0 - Asymptomatic (Fully active, able to carry on all predisease activities without restriction)  1 - Symptomatic but completely ambulatory (Restricted in physically strenuous activity but ambulatory and able to carry out work of a light or sedentary nature. For example, light housework, office work)  2 - Symptomatic, <50% in bed during the day (Ambulatory and capable of all self care but unable to carry out any work activities. Up and about more than 50% of waking hours)  3 - Symptomatic, >50% in bed, but not bedbound (Capable of only limited self-care, confined to bed or chair 50% or more of waking hours)  4 - Bedbound (Completely disabled. Cannot carry on any self-care. Totally confined to bed or chair)  5 - Death   Eustace Pen MM, Creech RH, Tormey DC, et al. [phone]). "Toxicity and response criteria of the [facility]". [place] Oncol. 5 (6): 649-55   LABORATORY DATA:  Lab Results  Component Value Date   WBC 5.5 06/06/2014   HGB 10.6* 06/06/2014   HCT 32.7* 06/06/2014   MCV 91.2 06/06/2014   PLT 292 06/06/2014   CMP     Component Value Date/Time   NA 140 06/06/2014 1229   K 4.4 06/06/2014 1229   CL 103 06/06/2014 1229   CO2 27 06/06/2014 1229   GLUCOSE 106* 06/06/2014 1229   BUN 10 06/06/2014 1229   CREATININE 0.68 06/06/2014 1229   CREATININE 0.73 03/05/2014 1044   CALCIUM 10.2 06/06/2014 1229   PROT 7.4 06/06/2014 1229   ALBUMIN 3.7 06/06/2014 1229    AST 14 06/06/2014 1229   ALT 12 06/06/2014 1229   ALKPHOS 53 06/06/2014 1229   BILITOT 0.3 06/06/2014 1229   GFRNONAA 79 07/24/2008 0846   GFRAA 96 07/24/2008 0846         RADIOGRAPHY: Mr Breast Bilateral W Wo Contrast  06/06/2014   ADDENDUM REPORT: 06/06/2014 10:35  ADDENDUM: Addendum by Dr. Conchita Paris on 06/06/2014 at 10:30 a.m. I was asked to review the patient's most recent breast MRI to determine whether previous imaging of the abdomen existed for correlation for T2 hyperintense masses reported on the original dictation. The patient does not have a record of prior abdominal imaging at this institution. Liver mass protocol MRI with contrast is recommended for further characterization, as previously reported.   Electronically Signed   By: Conchita Paris M.D.   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Results: BUN 10 mg/dL, Creatinine 0.7 mg/dL.  EXAM: BILATERAL BREAST MRI WITH AND WITHOUT CONTRAST  TECHNIQUE: Multiplanar, multisequence MR images of both breasts were obtained prior to and following the intravenous administration of 52ml of MultiHance.  THREE-DIMENSIONAL MR IMAGE RENDERING ON INDEPENDENT WORKSTATION:  Three-dimensional MR images were rendered by post-processing of the original MR data on an independent workstation. The three-dimensional MR images were interpreted, and findings are reported in the following complete MRI report for this study. Three dimensional images were evaluated at the independent DynaCad workstation  COMPARISON:  Previous exams  FINDINGS: Breast composition: b.  Scattered fibroglandular tissue.  Background parenchymal enhancement: Moderate  Right breast: No mass or abnormal enhancement.  Left breast: There is a tract of biopsy changes extending from cranial to  caudal, with a marker clip identify and in the anterior left breast 12 o'clock position. The marker clip is along the anterior and medial aspect of a poorly defined region of enhancement measuring 21 x 23 x 30 mm, corresponding roughly to the location of suspicious calcifications seen on recent mammogram.  Lymph nodes: There are numerous enlarged level 1 and level 2 left axillary lymph nodes many of which demonstrate severe cortical thickening.  Ancillary findings: There are a few oval T2 hyperintense liver lesions measuring less than or equal to 1 cm. These are not fully evaluated on the current examination. Convincing enhancement is not appreciated. These could be evaluated in detail with liver protocol MRI.  IMPRESSION: Known DCIS left breast. Highly suspicious left axillary lymph nodes. Liver lesions as described above seen incidentally, possibly cysts, which could be evaluated more comprehensively with hepatic MRI.  RECOMMENDATION: Proceed with appropriate management of known left breast carcinoma. Consider further evaluation of liver lesions as described above.  BI-RADS CATEGORY  6: Known biopsy-proven malignancy.  Electronically Signed: By: Skipper Cliche M.D. On: 05/30/2014 10:34   Mm Digital Diagnostic Unilat L  05/18/2014   CLINICAL DATA:  Screening callback for questioned left breast calcifications. The patient's most recent exam was performed in [place] reportedly 15 years ago.  EXAM: DIGITAL DIAGNOSTIC  left MAMMOGRAM  COMPARISON:  None  ACR Breast Density Category c: The breast tissue is heterogeneously dense, which may obscure small masses.  FINDINGS: There is a 5 cm segmental area of coarse heterogeneous calcifications in the left upper outer quadrant, corresponding to the screening mammographic finding.  IMPRESSION: Suspicious left upper outer quadrant 5 cm segmental area of calcifications. Stereotactic core needle biopsy has been scheduled 05/24/2014 at 10 a.m.  RECOMMENDATION: Left  stereotactic core needle biopsy  I have discussed the findings and recommendations with the patient. Results were also provided in writing at the conclusion of the visit. If applicable, a reminder letter will be sent to the patient regarding the next appointment.  BI-RADS CATEGORY  4: Suspicious.   Electronically Signed   By: Conchita Paris M.D.   On: 05/18/2014 16:10   Mm Digital Screening Bilateral  05/14/2014   CLINICAL DATA:  Screening.  EXAM: DIGITAL SCREENING BILATERAL MAMMOGRAM WITH CAD  COMPARISON:  No prior films  ACR Breast Density Category b: There are scattered areas of fibroglandular density.  FINDINGS: In the left breast, calcifications warrant further evaluation with magnified views. In the right breast, no findings suspicious for malignancy. Images were processed with CAD.  IMPRESSION: Further evaluation is suggested for calcifications in the left breast.  RECOMMENDATION: Diagnostic mammogram of the left breast. (Code:FI-L-32M)  The patient will be contacted regarding the findings, and additional imaging will be scheduled.  BI-RADS CATEGORY  0: Incomplete. Need additional imaging evaluation and/or prior mammograms for comparison.   Electronically Signed   By: Abelardo Diesel M.D.   On: 05/14/2014 10:43   Mm Lt Breast Bx W Loc Dev 1st Lesion Image Bx Spec Stereo Guide  05/25/2014   ADDENDUM REPORT: 05/25/2014 13:25  ADDENDUM: I spoke with the patient on May 25, 2014 to relay pathology results from recent left breast stereotactic guided core needle biopsy. Pathology results demonstrate ductal carcinoma with papillary features. Pathology is concordant with imaging findings. Patient states no complaints at the biopsy site.  Patient is referred for breast MRI evaluation and to multidisciplinary breast conference for surgical consultation. Her appointment is scheduled for 06/06/2014 8 a.m. with the multidisciplinary breast conference. She has no further questions at this time. She is encouraged to  call the breast center with any additional concerns or questions.   Electronically Signed   By: Lovey Newcomer M.D.   On: 05/25/2014 13:25   05/25/2014   CLINICAL DATA:  Patient with suspicious left breast calcifications.  EXAM: LEFT BREAST STEREOTACTIC CORE NEEDLE BIOPSY  COMPARISON:  Previous exams.  FINDINGS: The patient and I discussed the procedure of stereotactic-guided biopsy including benefits and alternatives. We discussed the high likelihood of a successful procedure. We discussed the risks of the procedure including infection, bleeding, tissue injury, clip migration, and inadequate sampling. Informed written consent was given. The usual time out protocol was performed immediately prior to the procedure.  Using sterile technique and 2% Lidocaine as local anesthetic, under stereotactic guidance, a 9 gauge vacuum assisted core needle biopsy device was used to perform core needle biopsy of calcifications within the upper-outer left breast anterior depth using a cranial approach. Specimen radiograph was performed showing calcifications. Specimens with calcifications are identified for pathology.  At the conclusion of the procedure, a X shaped tissue marker clip was deployed into the biopsy cavity. Follow-up 2-view mammogram confirmed clip in appropriate position.  IMPRESSION: Stereotactic-guided biopsy of left breast calcifications. No apparent complications.  Electronically Signed: By: Lovey Newcomer M.D. On: 05/24/2014 11:49      IMPRESSION/PLAN: lovely 58 yo with clinical T2N1Mx Left breast cancer  1) liver MRI will be ordered 2) social work to see her today - high level of distress 3) axillary biopsy pending for firm nodes 4) suspect invasive cancer in breast, though DCIS biopsied initially 5) possible neoadjuvant chemotherapy if axilla positive - to be discussed with Dr Lindi Adie further today 6) She will likely need adjuvant radiotherapy to her L breast and regional nodes in the future. We discussed  the risks, benefits, and side effects of radiotherapy. We discussed that radiation would take approximately 6 weeks to complete. We spoke about acute effects including skin irritation and fatigue as well as much less common late effects including lung and heart irritation. We spoke about the latest technology that is used to minimize the risk of late effects for breast cancer patients undergoing radiotherapy. No guarantees of treatment were given.  She is very distressed today, so I will review all of this next time we meet in my clinic.  __________________________________________   Eppie Gibson, MD

## 2014-06-07 ENCOUNTER — Ambulatory Visit
Admission: RE | Admit: 2014-06-07 | Discharge: 2014-06-07 | Disposition: A | Discharge status: Home / Self Care | Payer: Managed Care, Other (non HMO) | Source: Ambulatory Visit | Attending: General Surgery | Admitting: General Surgery

## 2014-06-07 ENCOUNTER — Encounter: Payer: Self-pay | Admitting: Internal Medicine

## 2014-06-07 ENCOUNTER — Encounter: Payer: Self-pay | Admitting: Radiation Oncology

## 2014-06-07 DIAGNOSIS — R928 Other abnormal and inconclusive findings on diagnostic imaging of breast: Secondary | ICD-10-CM

## 2014-06-07 HISTORY — PX: BREAST BIOPSY: SHX20

## 2014-06-07 NOTE — Progress Notes (Signed)
[place] Work  Clinical Social Work was referred by patient navigator for assessment of psychosocial needs due to change in diagnosis.  Clinical Social Worker met with patient after she was seen by chaplain at [facility] to offer support and assess for needs. Pt very tearful and emotional. She has been linked to counselors in the community and psychiatrist as well, but currently feels she needs a new one. She reports they do not seem to be helpful re her cancer diagnosis. CSW provided pt with list of therapists and psychiatrists as well. CSW discussed CSW role and resources at [facility] to assist as well. Chaplain, Lorrin Jackson met with pt at length and plans to address distress screen accordingly.  Pt aware how to connect with CSW team for future support.   Clinical Social Work interventions: Solution focused discussion  Emotional support Resource education  Loren Racer, [place] Clinical Social Worker [facility] [place] for [place] Wednesday, Thursday and Friday Phone: [phone] Fax: [phone]

## 2014-06-08 ENCOUNTER — Encounter: Payer: Self-pay | Admitting: General Practice

## 2014-06-08 NOTE — Progress Notes (Signed)
[place] Psychosocial Distress Screening Spiritual Care  Visited with Athalie in [facility] for intro to [place] Resources/Spiritual Care and distress screening protocol.  The patient scored a 8 on the Psychosocial Distress Thermometer which indicates severe distress. Also visited jointly with Loren Racer, LCSW, to assess for distress and other psychosocial needs, particularly exploring pt's desire for "more supportive" therapist.  Per pt, pt plans to seek counseling at [facility].    Per pt, she has hx depression, anxiety, isolation; pt reports increased loss of purpose after father's death 1-2 years ago.  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  Pt states that her anxiety has increased significantly since dx, particularly because it seems "more serious than I'd hoped."  Pt's affect was withdrawn.  She reports very little support and has just become acquainted with an advocate/friend at church who plans to help her with transportation and support for appts.    Pt reports that she has experienced "intense thirstiness, changes in taste, and [possible] difficulty swallowing" recently, and left arm has tingled since biopsy.     ONCBCN DISTRESS SCREENING 06/08/2014  Screening Type Initial Screening  Elta [place] the number that describes how much distress you have been experiencing in the past week 8  Emotional problem type Depression;Nervousness/Anxiety;Adjusting to illness;Isolation/feeling alone  Spiritual/Religous concerns type Relating to God;Facing my mortality;Loss of sense of purpose  Information Concerns Type Lack of info about diagnosis;Lack of info about treatment  Physical Problem type Pain;Sleep/insomnia;Tingling hands/feet;Skin dry/itchy;Other (comment)  Physician notified of physical symptoms Yes  Referral to clinical psychology No  Referral to clinical social work Yes  Referral to dietition No  Referral to financial advocate No   Referral to support programs Yes  Referral to palliative care No  Other Spiritual Care; note that pt also reports dizziness and "head fuzzy" above    Spiritual Care follow up needed: Yes.    If yes, follow up plan:  Have consulted with CSW and plan to f/u with pt by phone for further support and encouragement to pursue pt's desired counseling assistance.  [place], [place]

## 2014-06-09 ENCOUNTER — Telehealth: Payer: Self-pay | Admitting: Hematology and Oncology

## 2014-06-09 NOTE — Telephone Encounter (Signed)
per 8/12 pof f/u after pet/mri's/LN bx. appt tests currently on and LN bx order by dr [name] completed 8/13. lmonvm for pt re appt w/VG 8/28.

## 2014-06-11 ENCOUNTER — Encounter: Payer: Self-pay | Admitting: General Practice

## 2014-06-11 NOTE — Telephone Encounter (Signed)
Dr. Charlies Constable, is it okay to close encounter?

## 2014-06-12 ENCOUNTER — Telehealth: Payer: Self-pay | Admitting: *Deleted

## 2014-06-12 ENCOUNTER — Encounter: Payer: Self-pay | Admitting: Hematology and Oncology

## 2014-06-12 ENCOUNTER — Telehealth: Payer: Self-pay | Admitting: Hematology and Oncology

## 2014-06-12 NOTE — Telephone Encounter (Signed)
Spoke to pt concerning [place] from 06/06/14. Pt denies questions or concerns regarding dx or treatment care plan. Confirmed future appts. Encourage pt to call with needs. Received verbal understanding. Contact information given. Will reach out to [place] concerning pt disposition and flat affect. Pt had a high distress score on [place] visit. Pt relate she has been given so much information she doesn't know where to start.

## 2014-06-12 NOTE — Telephone Encounter (Signed)
, °

## 2014-06-12 NOTE — Progress Notes (Signed)
Put [employer] disability form on nurse's desk.

## 2014-06-13 ENCOUNTER — Ambulatory Visit (HOSPITAL_COMMUNITY)
Admission: RE | Admit: 2014-06-13 | Discharge: 2014-06-13 | Disposition: A | Discharge status: Home / Self Care | Payer: Managed Care, Other (non HMO) | Source: Ambulatory Visit | Attending: Hematology and Oncology | Admitting: Hematology and Oncology

## 2014-06-13 DIAGNOSIS — M949 Disorder of cartilage, unspecified: Secondary | ICD-10-CM | POA: Diagnosis not present

## 2014-06-13 DIAGNOSIS — C50419 Malignant neoplasm of upper-outer quadrant of unspecified female breast: Secondary | ICD-10-CM | POA: Diagnosis present

## 2014-06-13 DIAGNOSIS — C773 Secondary and unspecified malignant neoplasm of axilla and upper limb lymph nodes: Secondary | ICD-10-CM | POA: Insufficient documentation

## 2014-06-13 DIAGNOSIS — C50412 Malignant neoplasm of upper-outer quadrant of left female breast: Secondary | ICD-10-CM

## 2014-06-13 DIAGNOSIS — M899 Disorder of bone, unspecified: Secondary | ICD-10-CM | POA: Diagnosis not present

## 2014-06-13 LAB — GLUCOSE, CAPILLARY: GLUCOSE-CAPILLARY: 101 mg/dL — AB (ref 70–99)

## 2014-06-13 MED ORDER — FLUDEOXYGLUCOSE F - 18 (FDG) INJECTION
9.9000 | Freq: Once | INTRAVENOUS | Status: AC | PRN
  Administered 2014-06-13: 9.9 via INTRAVENOUS

## 2014-06-14 ENCOUNTER — Ambulatory Visit (INDEPENDENT_AMBULATORY_CARE_PROVIDER_SITE_OTHER): Payer: Managed Care, Other (non HMO) | Admitting: General Surgery

## 2014-06-14 ENCOUNTER — Telehealth: Payer: Self-pay | Admitting: Hematology and Oncology

## 2014-06-14 ENCOUNTER — Other Ambulatory Visit: Payer: Self-pay | Admitting: *Deleted

## 2014-06-14 DIAGNOSIS — C50419 Malignant neoplasm of upper-outer quadrant of unspecified female breast: Secondary | ICD-10-CM

## 2014-06-14 DIAGNOSIS — C50412 Malignant neoplasm of upper-outer quadrant of left female breast: Secondary | ICD-10-CM

## 2014-06-14 NOTE — Patient Instructions (Signed)
My office will call you to schedule your Port-A-Cath placement

## 2014-06-14 NOTE — Telephone Encounter (Signed)
, °

## 2014-06-14 NOTE — Progress Notes (Signed)
Chief complaint: Followup for treatment planning for new diagnosis of breast cancer  History: Patient was recently diagnosed with cancer of the left breast. She recently presented for a screening mamogram revealing Abnormal calcifications in the left breast.. Subsequent imaging included diagnostic mamogram showing A 5 cm segmental area of coarse pleomorphic calcifications in the upper outer quadrant of the left breast. 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 There was however noted a few areas of apparent lymphatic invasion and Some concern was raised regarding invasive disease. Subsequent breast MRI was performed revealing a poorly defined area of enhancement corresponding to the location of the calcifications measuring 21 x 30 mm. In addition were noted numerous enlarged level I and level II left axillary lymph nodes with severe cortical thickening. Also noted were some hyperintense liver lesions which could not be fully characterized.She was seen in breast multidisciplinary clinic for her initial appointment. At that time she was found to have a palpable left axillary adenopathy. Based on this imaging we recommended ultrasound-guided biopsy of her left axillary lymph nodes. This was just recently performed and has returned showing metastatic invasive ductal carcinoma.  Exam: Breast exam not repeated today, one week ago showed:  In the upper outer quadrant of the left breast is a somewhat soft and indistinct mass which feels to measure about 5 cm in greatest dimension anterior to posterior. There is at least one clearly enlarged firm lymph node which is movable palpable in the left axilla. Right breast and axilla are negative.  Lymph nodes: No cervical, supraclavicular, or inguinal nodes palp  Assessment and plan: Based on this recent biopsy her cancer is now stage IIb with a fairly large breast primary. I believe neoadjuvant  chemotherapy would offer some increased chance of breast conservation if we could get any shrinkage of her primary tumor. I recommended proceeding with neoadjuvant chemotherapy and will discuss this with medical oncology. If they are in agreement will plan Port-A-Cath placement.  I discussed this procedure with the patient including its indications and nature and risks of bleeding, infection, pneumothorax, catheter displacement or malfunction and a DVT. She understands and agrees to proceed.

## 2014-06-15 ENCOUNTER — Telehealth: Payer: Self-pay

## 2014-06-15 ENCOUNTER — Telehealth: Payer: Self-pay | Admitting: Hematology and Oncology

## 2014-06-15 ENCOUNTER — Encounter: Payer: Self-pay | Admitting: General Practice

## 2014-06-15 ENCOUNTER — Ambulatory Visit (HOSPITAL_COMMUNITY)
Admission: RE | Admit: 2014-06-15 | Discharge: 2014-06-15 | Disposition: A | Discharge status: Home / Self Care | Payer: Managed Care, Other (non HMO) | Source: Ambulatory Visit | Attending: Hematology and Oncology | Admitting: Hematology and Oncology

## 2014-06-15 DIAGNOSIS — C50412 Malignant neoplasm of upper-outer quadrant of left female breast: Secondary | ICD-10-CM

## 2014-06-15 DIAGNOSIS — C50419 Malignant neoplasm of upper-outer quadrant of unspecified female breast: Secondary | ICD-10-CM | POA: Insufficient documentation

## 2014-06-15 MED ORDER — GADOBENATE DIMEGLUMINE 529 MG/ML IV SOLN
20.0000 mL | Freq: Once | INTRAVENOUS | Status: AC
  Administered 2014-06-15: 20 mL via INTRAVENOUS

## 2014-06-15 NOTE — Progress Notes (Signed)
Made f/u phone call for support, per referral from Bary Castilla, breast ca nurse navigator.  Per pt, she has participated in a cooking class, a yoga class, a breast ca support group, gotten a free wig through [employer], and learned that her health ins covers wig expense up to $500. She has also registered for "Rising Strong" book study starting 9/14.  To support her health, she plans to take supplements, which she will bring to doc appt 8/28 for MD review.  Per pt, this engagement in self-care offerings is about "moving some of the trauma out of it [facing diagnosis and treatment]"; it's "almost a treat to do these for myself."  She also calls this opportunity "a God thing" and "a learning experience."  She met a counselor at yoga class and plans to explore seeking care (or a referral) from her.  Provided pastoral reflection, affirmation of Amorie's self-empowering initiative, and encouragement.  Plan for continued check-ins.  Pt also aware of ongoing chaplain availability.  [place], [place]

## 2014-06-15 NOTE — Telephone Encounter (Signed)
, °

## 2014-06-15 NOTE — Telephone Encounter (Signed)
Returned pt call re: treatment options.  Pt "heard there are hormones that can shrink tumor".  Advised pt that this depends on the nature of the tumor and the tumor pathology and that it was planned to discuss her treatment option on her next visit with Dr. Lindi Adie.  Pt voiced understanding.

## 2014-06-18 ENCOUNTER — Ambulatory Visit (HOSPITAL_COMMUNITY)
Admission: RE | Admit: 2014-06-18 | Discharge: 2014-06-18 | Disposition: A | Discharge status: Home / Self Care | Payer: Managed Care, Other (non HMO) | Source: Ambulatory Visit | Attending: Hematology and Oncology | Admitting: Hematology and Oncology

## 2014-06-18 ENCOUNTER — Other Ambulatory Visit: Payer: Self-pay | Admitting: Hematology and Oncology

## 2014-06-18 ENCOUNTER — Telehealth: Payer: Self-pay

## 2014-06-18 DIAGNOSIS — C50412 Malignant neoplasm of upper-outer quadrant of left female breast: Secondary | ICD-10-CM

## 2014-06-18 DIAGNOSIS — C50419 Malignant neoplasm of upper-outer quadrant of unspecified female breast: Secondary | ICD-10-CM | POA: Insufficient documentation

## 2014-06-18 DIAGNOSIS — K7689 Other specified diseases of liver: Secondary | ICD-10-CM | POA: Insufficient documentation

## 2014-06-18 MED ORDER — GADOBENATE DIMEGLUMINE 529 MG/ML IV SOLN
20.0000 mL | Freq: Once | INTRAVENOUS | Status: AC | PRN
  Administered 2014-06-18: 20 mL via INTRAVENOUS

## 2014-06-18 NOTE — Telephone Encounter (Signed)
Faxed [employer] form on 06/13/14.  Sent to scan.

## 2014-06-20 ENCOUNTER — Encounter: Payer: Self-pay | Admitting: *Deleted

## 2014-06-20 ENCOUNTER — Encounter: Payer: Self-pay | Admitting: Specialist

## 2014-06-20 NOTE — Progress Notes (Signed)
[place] Work  Clinical Social Work was referred by Bonney Roussel, Epifania Gore and [place] facilitator for assessment of psychosocial needs due to possible needs for further counseling support.  Clinical Social Worker contacted patient via phone to check in and to offer support and assess for needs.  Pt was provided with a list of community counselors and psychiatrists back at her clinic appointment on 06/06/14. Pt had expressed plans to contact [facility] for additional support at that visit. CSW inquired if pt had followed up yet. Pt reports she had been doing pretty well lately, with "only a few ups and downs" and had not called yet due to losing the list of providers. Pt open to CSW trying to meet with pt after chemo class for a new copy of mental health providers list and additional support. CSW to see pt after chemo class in the am.   Clinical Social Work interventions: Engineer, drilling education [place], [place] [facility] Social Worker [facility] [place] for [place] Wednesday, Thursday and Friday Phone: [phone] Fax: [phone]

## 2014-06-20 NOTE — Progress Notes (Signed)
Kimberly Reed, LPA, called regarding this patient. She has been present at 2 support group meetings. Kimberly Reed is concerned about the patient's affect, thought processes, and social support. Kimberly Reed has expressed fear about the chemo's effects ("toxic") and Kimberly Reed thought Kimberly Reed may just stop coming and not go through treatment. Other support group members also expressed concern after the group was done for the evening. Kimberly Reed has a relatively new diagnosis and had a distress screen of "8" on her first clinic visit. Kimberly Reed and I talked about what kind of referral needs to be given for Kimberly Reed and decided that Kimberly Reed will call social worker Kimberly Reed and RN Kimberly Reed who teaches the chemo orientation since Kimberly Reed is scheduled soon for the chemo class.    Kimberly Gore, PhD, [place]

## 2014-06-21 ENCOUNTER — Encounter: Payer: Self-pay | Admitting: *Deleted

## 2014-06-21 ENCOUNTER — Encounter (INDEPENDENT_AMBULATORY_CARE_PROVIDER_SITE_OTHER): Payer: Self-pay

## 2014-06-21 ENCOUNTER — Ambulatory Visit (HOSPITAL_COMMUNITY)
Admission: RE | Admit: 2014-06-21 | Discharge: 2014-06-21 | Disposition: A | Discharge status: Home / Self Care | Payer: Managed Care, Other (non HMO) | Source: Ambulatory Visit | Attending: Hematology and Oncology | Admitting: Hematology and Oncology

## 2014-06-21 ENCOUNTER — Other Ambulatory Visit: Payer: Managed Care, Other (non HMO)

## 2014-06-21 DIAGNOSIS — E785 Hyperlipidemia, unspecified: Secondary | ICD-10-CM | POA: Insufficient documentation

## 2014-06-21 DIAGNOSIS — R0789 Other chest pain: Secondary | ICD-10-CM | POA: Insufficient documentation

## 2014-06-21 DIAGNOSIS — C50412 Malignant neoplasm of upper-outer quadrant of left female breast: Secondary | ICD-10-CM

## 2014-06-21 DIAGNOSIS — C50919 Malignant neoplasm of unspecified site of unspecified female breast: Secondary | ICD-10-CM | POA: Diagnosis not present

## 2014-06-21 DIAGNOSIS — R55 Syncope and collapse: Secondary | ICD-10-CM | POA: Diagnosis not present

## 2014-06-21 DIAGNOSIS — Z8489 Family history of other specified conditions: Secondary | ICD-10-CM | POA: Insufficient documentation

## 2014-06-21 DIAGNOSIS — I517 Cardiomegaly: Secondary | ICD-10-CM

## 2014-06-21 NOTE — Progress Notes (Signed)
*  PRELIMINARY RESULTS* Echocardiogram 2D Echocardiogram has been performed.  Leavy Cella 06/21/2014, 9:50 AM

## 2014-06-21 NOTE — Progress Notes (Signed)
[facility] Clinical Social Work  Clinical Social Work was referred by nurse for assessment of psychosocial needs due to some cognitive issues with processing plans for chemo. Clinical Social Worker met with patient after chemo class to check in and to offer support and assess for needs.  Pt felt taking chemo was "like going on an alien space ship". She is scared, anxious and angry about this plan, per report. CSW attempted to normalize some of these emotions, but feels there are complex cognitive or mental health concerns that may limit pt's coping ability at this time.   Pt reports she is not interested in returning to her psychiatrist or counselor at this time. Pt reports to have her medications managed by [facility] in [place] and sees Crystal Montague at that practice. It may be helpful to get more background from them to better treat pt's cancer. Pt reports to have a longstanding relationship with them. CSW encouraged pt to reach out to them as she may have issues with her depression and anxiety that need closer management. Pt is willing to consider this. She is not very open to additional counseling at this time due to cost, time and "being tired of counseling". Pt did agree for CSW to search for additional resources in this area. Pt has been very open to many of the support programs here at [facility] and is open to an [facility]. She agrees for CSW to make this referral. CSW plans to check in with pt after her appointment in the am, make referral to Alight Guide and additional counseling resources.   Clinical Social Work interventions: Assessment Emotional Support Resource Education   Grier , LCSW Clinical Social Worker [facility] Wednesday, Thursday and Friday Phone: [phone] Fax: [phone]      

## 2014-06-22 ENCOUNTER — Ambulatory Visit (HOSPITAL_BASED_OUTPATIENT_CLINIC_OR_DEPARTMENT_OTHER): Payer: Managed Care, Other (non HMO) | Admitting: Hematology and Oncology

## 2014-06-22 VITALS — BP 143/76 | HR 72 | Temp 98.3°F | Resp 18 | Ht 65.0 in | Wt 198.6 lb

## 2014-06-22 DIAGNOSIS — C50419 Malignant neoplasm of upper-outer quadrant of unspecified female breast: Secondary | ICD-10-CM

## 2014-06-22 DIAGNOSIS — D6481 Anemia due to antineoplastic chemotherapy: Secondary | ICD-10-CM | POA: Insufficient documentation

## 2014-06-22 DIAGNOSIS — C50412 Malignant neoplasm of upper-outer quadrant of left female breast: Secondary | ICD-10-CM

## 2014-06-22 DIAGNOSIS — D649 Anemia, unspecified: Secondary | ICD-10-CM

## 2014-06-22 DIAGNOSIS — Z170 Estrogen receptor positive status [ER+]: Secondary | ICD-10-CM

## 2014-06-22 DIAGNOSIS — T451X5A Adverse effect of antineoplastic and immunosuppressive drugs, initial encounter: Secondary | ICD-10-CM | POA: Insufficient documentation

## 2014-06-22 NOTE — Progress Notes (Signed)
Patient Care Team: Rachell Cipro, MD as PCP - General (Family Medicine) [facility]  Rulon Eisenmenger, MD as Consulting Physician (Hematology and Oncology) Edward Jolly, MD as Consulting Physician (General Surgery) Eppie Gibson, MD as Attending Physician (Radiation Oncology)  DIAGNOSIS: Breast cancer of upper-outer quadrant of left female breast   Primary site: Breast (Left)   Staging method: AJCC 7th Edition   Clinical: Stage IIB (T2, N1, cM0)   Summary: Stage IIB (T2, N1, cM0)   Clinical comments: Staged at breast conference 06/06/14.   SUMMARY OF ONCOLOGIC HISTORY:   Breast cancer of upper-outer quadrant of left female breast   05/18/2014 Mammogram IMPRESSION: Suspicious left upper outer quadrant 5 cm segmental area of calcifications.    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    CHIEF COMPLIANT: Continuing to have distress from the breast cancer diagnosis  INTERVAL HISTORY: Kimberly Reed is a 58 year old Caucasian lady with above-mentioned history of left-sided breast cancer that involved the left axilla. We saw her in and be seen in clinic and she is here today to discuss the results of multiple tests were done. The left axillary lymph node biopsy came back positive for malignancy invasive ductal carcinoma. Echocardiogram was normal in the liver and brain MRIs were also normal. The PET/CT scan revealed hypermetabolic activity at O97 in addition to the breast and axilla. That was concerning for metastatic disease. She does not have any symptoms of pain or discomfort in the back. She does not remember having any trauma or any other problems in her back even before.   REVIEW OF SYSTEMS:   Constitutional: Denies fevers, chills or abnormal weight loss Eyes: Denies blurriness of  vision Ears, nose, mouth, throat, and face: Denies mucositis or sore throat Respiratory: Denies cough, dyspnea or wheezes Cardiovascular: Denies palpitation, chest discomfort or lower extremity swelling Gastrointestinal:  Denies nausea, heartburn or change in bowel habits Skin: Denies abnormal skin rashes Lymphatics: Denies new lymphadenopathy or easy bruising Neurological:Denies numbness, tingling or new weaknesses Behavioral/Psych: Mood is stable, no new changes  Breast: Palpable lump in the left breast and axilla  All other systems were reviewed with the patient and are negative.  I have reviewed the past medical history, past surgical history, social history and family history with the patient and they are unchanged from previous note.  ALLERGIES:  is allergic to other and effexor.  MEDICATIONS:  Current Outpatient Prescriptions  Medication Sig Dispense Refill  . ARIPiprazole (ABILIFY) 10 MG tablet Take 10 mg by mouth daily.      . brimonidine-timolol (COMBIGAN) 0.2-0.5 % ophthalmic solution Place 1 drop into both eyes every 12 (twelve) hours.        . Cholecalciferol (VITAMIN D-3) 5000 UNITS TABS Take 1 tablet by mouth daily.      . dorzolamide (TRUSOPT) 2 % ophthalmic solution 1 drop 3 (three) times daily.        [name] MELATONIN PO Take by mouth.      . Multiple Vitamin (MULTIVITAMIN WITH MINERALS) TABS Take 1 tablet by mouth daily.      [name] thyroid (ARMOUR THYROID) 180 MG tablet Take 1 tablet (180 mg total) by mouth daily before breakfast.  90 tablet  2  . Travoprost, BAK Free, (TRAVATAN) 0.004 % SOLN ophthalmic solution Place 1 drop into both  eyes at bedtime.      [name] UNABLE TO FIND Med Name: Hart Robinsons      . traZODone (DESYREL) 50 MG tablet Take 12.5 mg by mouth at bedtime as needed.       [name] UNABLE TO FIND Med Name: Ashwaganda       No current facility-administered medications for this visit.    PHYSICAL EXAMINATION: ECOG PERFORMANCE STATUS: 1 - Symptomatic but completely  ambulatory  Filed Vitals:   06/22/14 1516  BP: 143/76  Pulse: 72  Temp: 98.3 F (36.8 C)  Resp: 18   Filed Weights   06/22/14 1516  Weight: 198 lb 9.6 oz (90.084 kg)    GENERAL:alert, no distress and comfortable SKIN: skin color, texture, turgor are normal, no rashes or significant lesions EYES: normal, Conjunctiva are pink and non-injected, sclera clear OROPHARYNX:no exudate, no erythema and lips, buccal mucosa, and tongue normal  NECK: supple, thyroid normal size, non-tender, without nodularity LYMPH:  no palpable lymphadenopathy in the cervical, axillary or inguinal LUNGS: clear to auscultation and percussion with normal breathing effort HEART: regular rate & rhythm and no murmurs and no lower extremity edema ABDOMEN:abdomen soft, non-tender and normal bowel sounds Musculoskeletal:no cyanosis of digits and no clubbing  NEURO: alert & oriented x 3 with fluent speech, no focal motor/sensory deficits   LABORATORY DATA:  I have reviewed the data as listed   Chemistry      Component Value Date/Time   NA 140 06/06/2014 1229   K 4.4 06/06/2014 1229   CL 103 06/06/2014 1229   CO2 27 06/06/2014 1229   BUN 10 06/06/2014 1229   CREATININE 0.68 06/06/2014 1229   CREATININE 0.73 03/05/2014 1044      Component Value Date/Time   CALCIUM 10.2 06/06/2014 1229   ALKPHOS 53 06/06/2014 1229   AST 14 06/06/2014 1229   ALT 12 06/06/2014 1229   BILITOT 0.3 06/06/2014 1229       Lab Results  Component Value Date   WBC 5.5 06/06/2014   HGB 10.6* 06/06/2014   HCT 32.7* 06/06/2014   MCV 91.2 06/06/2014   PLT 292 06/06/2014   NEUTROABS 2.8 06/06/2014     RADIOGRAPHIC STUDIES: I have personally reviewed the radiology reports and agreed with their findings. Reviewed the results of the MRI brain and MRI liver with the patient which were both normal except for cysts in the liver PET/CT scan showed hypermetabolic activity in the left breast and axilla in addition to T11 vertebral body lesion that is  hypermetabolic and is concerning for malignancy. Echocardiogram showed EF of 65% Chemotherapy education was complete a 28 2050  ASSESSMENT & PLAN:  Breast cancer of upper-outer quadrant of left female breast 1. 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 Patient returns after undergoing multiple tests including a PET/CT brain and liver MRI scans along with echocardiogram and a chemotherapy patient class. The brain and liver MRIs did not reveal any abnormalities in liver MRI showed cysts. But the PET/CT scan revealed T11 vertebral body lesion that showed mild SUV uptake with the lesion seen on the CT scan concerning for bony metastatic disease. I reviewed the pictures off the PET/CT to the patient. I would like to present her case in the tumor board once again. Given that it is an isolated lesion, we could treat her definitively with neoadjuvant chemotherapy followed by surgery and radiation. Biopsy of the left axillary lymph node mass came back as invasive  ductal carcinoma.  2. Chemotherapy education and consent: I have discussed the risks and benefits of chemotherapy including the risks of nausea/ vomiting, risk of infection from low WBC count, fatigue due to chemo or anemia, bruising or bleeding due to low platelets, mouth sores, loss/ change in taste and decreased appetite. Liver and kidney function will be monitored through out chemotherapy as abnormalities in liver and kidney function may be a side effect of treatment. Cardiac dysfunction due to Adriamycin was discussed in detail. Risk of permanent bone marrow dysfunction and leukemia due to chemo were also discussed.  Chemotherapy counseling and education in print format were provided and all the questions were answered.  2. patient takes extraordinary amount of nutritional supplements and vitamins. Many of which I did not even understand are recognized. I recommended that she take minimal  amount of supplements during chemotherapy and recommended against taking any antioxidants.  I would like to see her back on day of chemotherapy which can be decided based on the port placement.   Anemia, unspecified Anemia normocytic in nature. Hemoglobin is 10.6 I suspect this is anemia due to stress. When she starts chemotherapy I would like to check K12 folic acid and iron studies to document any nutritional deficiencies. It is hard to believe that she would be nutritionally deficient with the amount of supplements that she takes. Previous thyroid function tests were reviewed and were normal.    No orders of the defined types were placed in this encounter.   The patient has a good understanding of the overall plan. she agrees with it. She will call with any problems that may develop before her next visit here.  I spent 55 minutes counseling the patient face to face. The total time spent in the appointment was 60 minutes and more than 50% was on counseling and review of test results    Rulon Eisenmenger, MD 06/22/2014 4:33 PM

## 2014-06-22 NOTE — Assessment & Plan Note (Signed)
Anemia normocytic in nature. Hemoglobin is 10.6 I suspect this is anemia due to stress. When she starts chemotherapy I would like to check A45 folic acid and iron studies to document any nutritional deficiencies. It is hard to believe that she would be nutritionally deficient with the amount of supplements that she takes. Previous thyroid function tests were reviewed and were normal.

## 2014-06-22 NOTE — Assessment & Plan Note (Addendum)
1. 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 Patient returns after undergoing multiple tests including a PET/CT brain and liver MRI scans along with echocardiogram and a chemotherapy patient class. The brain and liver MRIs did not reveal any abnormalities in liver MRI showed cysts. But the PET/CT scan revealed T11 vertebral body lesion that showed mild SUV uptake with the lesion seen on the CT scan concerning for bony metastatic disease. I reviewed the pictures off the PET/CT to the patient. I would like to present her case in the tumor board once again. Given that it is an isolated lesion, we could treat her definitively with neoadjuvant chemotherapy followed by surgery and radiation. Biopsy of the left axillary lymph node mass came back as invasive ductal carcinoma.  2. Chemotherapy education and consent: I have discussed the risks and benefits of chemotherapy including the risks of nausea/ vomiting, risk of infection from low WBC count, fatigue due to chemo or anemia, bruising or bleeding due to low platelets, mouth sores, loss/ change in taste and decreased appetite. Liver and kidney function will be monitored through out chemotherapy as abnormalities in liver and kidney function may be a side effect of treatment. Cardiac dysfunction due to Adriamycin was discussed in detail. Risk of permanent bone marrow dysfunction and leukemia due to chemo were also discussed.  Chemotherapy counseling and education in print format were provided and all the questions were answered.  2. patient takes extraordinary amount of nutritional supplements and vitamins. Many of which I did not even understand are recognized. I recommended that she take minimal amount of supplements during chemotherapy and recommended against taking any antioxidants.  I would like to see her back on day of chemotherapy which can be decided based on the port placement.

## 2014-06-25 ENCOUNTER — Telehealth: Payer: Self-pay | Admitting: *Deleted

## 2014-06-25 ENCOUNTER — Encounter: Payer: Self-pay | Admitting: *Deleted

## 2014-06-25 NOTE — Progress Notes (Signed)
[facility] Psychosocial Distress Screening Clinical Social Work  Clinical Social Work was referred by distress screening protocol.  The patient scored a 8 on the Psychosocial Distress Thermometer which indicates moderate distress. Clinical Social Worker, Grier Hock met with patient as noted previously.  Patients distress was assessed and appropriate interventions were provided.     ONCBCN DISTRESS SCREENING 06/22/2014  Screening Type   Mark the number that describes how much distress you have been experiencing in the past week 8  Emotional problem type Depression;Nervousness/Anxiety;Adjusting to illness;Isolation/feeling alone;Feeling hopeless;Boredom  Spiritual/Religous concerns type Relating to God;Facing my mortality;Loss of Faith;Loss of sense of purpose  Information Concerns Type   Physical Problem type Sleep/insomnia;Skin dry/itchy  Physician notified of physical symptoms Yes  Referral to clinical psychology   Referral to clinical social work   Referral to dietition   Referral to financial advocate   Referral to support programs   Referral to palliative care   Other     , MSW, LCSW, OSW-C Clinical Social Worker [facility] [phone]       

## 2014-06-25 NOTE — Telephone Encounter (Signed)
Received call from [place] with [facility] in reference to appt for port placement. Left voice mail message that port placement can be scheduled on 9/11.

## 2014-06-25 NOTE — Telephone Encounter (Signed)
Received call from patient that port placement was to be scheduled this week but has been scheduled for 9/11. Returned patient's call and informed patient that Dr. Lindi Adie was fine with waiting until 9/11 to have port placed. Patient verbalized understanding and agreed to have port placed on 9/11. Patient requesting to be scheduled for another chemo education class as she had some other questions. Left message with Al[place]ta to call patient.

## 2014-06-26 ENCOUNTER — Telehealth: Payer: Self-pay

## 2014-06-26 ENCOUNTER — Encounter (HOSPITAL_COMMUNITY): Payer: Self-pay | Admitting: Pharmacy Technician

## 2014-06-26 ENCOUNTER — Telehealth: Payer: Self-pay | Admitting: *Deleted

## 2014-06-26 NOTE — Telephone Encounter (Signed)
No additional note

## 2014-06-26 NOTE — Telephone Encounter (Signed)
LMOVM - returned pt call.  Let her know I have sent her scheduling request to scheduler and they should be calling her to arrange her 1st chemo.  Also let patient know that 1st chemo treatments have to be started prior to 12 pm.  Let pt know if her 2nd call was about something other than scheduling (she did not lv details on the 2nd msg), she should call back and ask for Dr. Geralyn Flash desk nurse.

## 2014-06-27 ENCOUNTER — Encounter: Payer: Self-pay | Admitting: Hematology and Oncology

## 2014-06-27 ENCOUNTER — Other Ambulatory Visit: Payer: Self-pay | Admitting: Hematology and Oncology

## 2014-06-27 ENCOUNTER — Ambulatory Visit (INDEPENDENT_AMBULATORY_CARE_PROVIDER_SITE_OTHER): Payer: Managed Care, Other (non HMO) | Admitting: General Surgery

## 2014-06-27 DIAGNOSIS — C50412 Malignant neoplasm of upper-outer quadrant of left female breast: Secondary | ICD-10-CM

## 2014-06-27 NOTE — Progress Notes (Signed)
Still no date of treatment/episodes as of today.

## 2014-06-28 ENCOUNTER — Telehealth: Payer: Self-pay

## 2014-06-28 ENCOUNTER — Encounter: Payer: Self-pay | Admitting: Hematology and Oncology

## 2014-06-28 NOTE — Progress Notes (Signed)
Pt came in inquiring about assistance to pay her rent.  I discussed w/ her the [employer] and went into detail about the foundation and what they assist with.  I informed her in order to apply for the grant we need her 2 latest short term disability check stubs.  I let her know that once we receive her check stubs we will see if she qualifies for the $1000 grant.  I also printed out applications from the [facility] as well as the [employer] that offer assistance to pt's w/ breast cancer.  Pt said she has the Access One card already.  I reminded her that she can get her bills from the [employer] added to that card if she would like.  I also gave her [name] and Abby Potash cards to contact for additional resources.

## 2014-06-28 NOTE — Telephone Encounter (Signed)
9/2 - LMOVM requested pt return call to clinic.    Pt walk in to clinic - saw Melissa V.  Saw Annamary Rummage for financial assistance programs.  Melissa asked this RN to speak with the patient.  I spoke with the patient - gave her the prescription for a wig.  Let pt know I would get her a letter to [facility] ([facility] not [facility] [employer], [facility]).  Let pt know CT biopsy scheduled by central scheduling and had not been scheduled yet.  Advised her central scheduling would get in touch with her with a date and time when it is set up.  Let pt know I would confirm her chemo appt date and time once scheduled.  Pt is concerned about arranging transportation.    Letter created.  Sent to patient.

## 2014-07-03 ENCOUNTER — Encounter (HOSPITAL_COMMUNITY): Payer: Self-pay

## 2014-07-03 ENCOUNTER — Encounter (HOSPITAL_COMMUNITY)
Admission: RE | Admit: 2014-07-03 | Discharge: 2014-07-03 | Disposition: A | Discharge status: Home / Self Care | Payer: Managed Care, Other (non HMO) | Source: Ambulatory Visit | Attending: General Surgery | Admitting: General Surgery

## 2014-07-03 ENCOUNTER — Other Ambulatory Visit: Payer: Self-pay

## 2014-07-03 DIAGNOSIS — C50412 Malignant neoplasm of upper-outer quadrant of left female breast: Secondary | ICD-10-CM

## 2014-07-03 DIAGNOSIS — C50919 Malignant neoplasm of unspecified site of unspecified female breast: Secondary | ICD-10-CM | POA: Diagnosis present

## 2014-07-03 DIAGNOSIS — E039 Hypothyroidism, unspecified: Secondary | ICD-10-CM | POA: Diagnosis not present

## 2014-07-03 DIAGNOSIS — H409 Unspecified glaucoma: Secondary | ICD-10-CM | POA: Diagnosis not present

## 2014-07-03 HISTORY — DX: Anemia, unspecified: D64.9

## 2014-07-03 HISTORY — DX: Insomnia, unspecified: G47.00

## 2014-07-03 HISTORY — DX: Malignant (primary) neoplasm, unspecified: C80.1

## 2014-07-03 NOTE — Pre-Procedure Instructions (Addendum)
07-03-14  EKG 5'15, [id number], labs CBC/d, CMP 06-06-14 Epic. PET scan -skull to thigh) -Epic.

## 2014-07-03 NOTE — Patient Instructions (Addendum)
Kimberly Reed  07/03/2014   Your procedure is scheduled on:   07-06-2014 Friday  Enter through [facility] Entrance and follow signs to [facility]. Arrive at   0530    AM .  Call this number if you have problems the morning of surgery: [phone]  Or Presurgical Testing [phone].   For Living Will and/or Health Care Power Attorney Forms: please provide copy for your medical record,may bring AM of surgery(Forms should be already notarized -we do not provide this service).(07-03-14 Yes information preferred today and given).      Do not eat food/ or drink: After Midnight.  .  Take these medicines the morning of surgery with A SIP OF WATER: Armour thyroid, use eye drops/bring.   Do not wear jewelry, make-up or nail polish.  Do not wear lotions, powders, or perfumes. You may not  wear deodorant.  Do not shave 48 hours(2 days) prior to first CHG shower(legs and under arms).(Shaving face and neck okay.)  Do not bring valuables to the hospital.(Hospital is not responsible for lost valuables).  Contacts, dentures or removable bridgework, body piercing, hair pins may not be worn into surgery.  Leave suitcase in the car. After surgery it may be brought to your room.  For patients admitted to the hospital, checkout time is 11:00 AM the day of discharge.(Restricted visitors-Any Persons displaying flu-like symptoms or illness).    Patients discharged the day of surgery will not be allowed to drive home. Must have responsible person with you x 24 hours once discharged.  Name and phone number of your driver: Kimberly Reed -friend  Special Instructions: CHG(Chlorhedine 4%-"Hibiclens","Betasept","Aplicare") Shower Use Special Wash: see special instructions.(avoid face and genitals).    _______    [facility] - Preparing for Surgery Before surgery, you can play an important role.  Because skin is not sterile, your skin needs to be as free of germs as possible.  You can reduce the  number of germs on your skin by washing with CHG (chlorahexidine gluconate) soap before surgery.  CHG is an antiseptic cleaner which kills germs and bonds with the skin to continue killing germs even after washing. Please DO NOT use if you have an allergy to CHG or antibacterial soaps.  If your skin becomes reddened/irritated stop using the CHG and inform your nurse when you arrive at Short Stay. Do not shave (including legs and underarms) for at least 48 hours prior to the first CHG shower.  You may shave your face/neck. Please follow these instructions carefully:  1.  Shower with CHG Soap the night before surgery and the  morning of Surgery.  2.  If you choose to wash your hair, wash your hair first as usual with your  normal  shampoo.  3.  After you shampoo, rinse your hair and body thoroughly to remove the  shampoo.                           4.  Use CHG as you would any other liquid soap.  You can apply chg directly  to the skin and wash                       Gently with a scrungie or clean washcloth.  5.  Apply the CHG Soap to your body ONLY FROM THE NECK DOWN.   Do not use on face/ open  Wound or open sores. Avoid contact with eyes, ears mouth and genitals (private parts).                       Wash face,  Genitals (private parts) with your normal soap.             6.  Wash thoroughly, paying special attention to the area where your surgery  will be performed.  7.  Thoroughly rinse your body with warm water from the neck down.  8.  DO NOT shower/wash with your normal soap after using and rinsing off  the CHG Soap.                9.  Pat yourself dry with a clean towel.            10.  Wear clean pajamas.            11.  Place clean sheets on your bed the night of your first shower and do not  sleep with pets. Day of Surgery : Do not apply any lotions/deodorants the morning of surgery.  Please wear clean clothes to the hospital/surgery center.  FAILURE TO FOLLOW  THESE INSTRUCTIONS MAY RESULT IN THE CANCELLATION OF YOUR SURGERY PATIENT SIGNATURE_________________________________  NURSE SIGNATURE__________________________________  ________________________________________________________________________

## 2014-07-04 ENCOUNTER — Telehealth: Payer: Self-pay

## 2014-07-04 ENCOUNTER — Encounter: Payer: Self-pay | Admitting: Hematology and Oncology

## 2014-07-04 ENCOUNTER — Other Ambulatory Visit: Payer: Self-pay

## 2014-07-04 DIAGNOSIS — C50412 Malignant neoplasm of upper-outer quadrant of left female breast: Secondary | ICD-10-CM

## 2014-07-04 DIAGNOSIS — F419 Anxiety disorder, unspecified: Secondary | ICD-10-CM | POA: Insufficient documentation

## 2014-07-04 MED ORDER — LORAZEPAM 1 MG PO TABS: 1.0000 mg | ORAL_TABLET | Freq: Two times a day (BID) | ORAL | Status: DC | PRN

## 2014-07-04 NOTE — Telephone Encounter (Signed)
Returned pt call.  Let pt know date/time of biopsy has not been set yet.  Let pt know she does not have chemo on Monday, that chemo will not be set until biopsy results are in.  Let pt know I do not have frequency of treatments as there will be no treatment plan entered until after biopsy is resulted.  Pt questioned scheduling of bx - let her know MD review was needed and CT has put it in their work queue.  Pt voiced understanding.

## 2014-07-05 NOTE — Anesthesia Preprocedure Evaluation (Addendum)
Anesthesia Evaluation  Patient identified by MRN, date of birth, ID band Patient awake  General Assessment Comment:Breast Ca  Reviewed: Allergy & Precautions, H&P , NPO status , Patient's Chart, lab work & pertinent test results  Airway Mallampati: II TM Distance: >3 FB Neck ROM: Full    Dental no notable dental hx.    Pulmonary neg pulmonary ROS,  breath sounds clear to auscultation  Pulmonary exam normal       Cardiovascular negative cardio ROS  Rhythm:Regular Rate:Normal     Neuro/Psych Glaucoma negative neurological ROS  negative psych ROS   GI/Hepatic negative GI ROS, Neg liver ROS,   Endo/Other  Hypothyroidism   Renal/GU negative Renal ROS  negative genitourinary   Musculoskeletal negative musculoskeletal ROS (+)   Abdominal   Peds negative pediatric ROS (+)  Hematology negative hematology ROS (+)   Anesthesia Other Findings   Reproductive/Obstetrics negative OB ROS                           Anesthesia Physical Anesthesia Plan  ASA: II  Anesthesia Plan: General   Post-op Pain Management:    Induction: Intravenous  Airway Management Planned: LMA  Additional Equipment:   Intra-op Plan:   Post-operative Plan:   Informed Consent: I have reviewed the patients History and Physical, chart, labs and discussed the procedure including the risks, benefits and alternatives for the proposed anesthesia with the patient or authorized representative who has indicated his/her understanding and acceptance.   Dental advisory given  Plan Discussed with: CRNA and Surgeon  Anesthesia Plan Comments:         Anesthesia Quick Evaluation

## 2014-07-06 ENCOUNTER — Ambulatory Visit (HOSPITAL_COMMUNITY): Payer: Managed Care, Other (non HMO)

## 2014-07-06 ENCOUNTER — Ambulatory Visit (HOSPITAL_COMMUNITY): Payer: Managed Care, Other (non HMO) | Admitting: Anesthesiology

## 2014-07-06 ENCOUNTER — Ambulatory Visit (HOSPITAL_COMMUNITY)
Admission: RE | Admit: 2014-07-06 | Discharge: 2014-07-06 | Disposition: A | Discharge status: Home / Self Care | Payer: Managed Care, Other (non HMO) | Source: Ambulatory Visit | Attending: General Surgery | Admitting: General Surgery

## 2014-07-06 ENCOUNTER — Encounter (HOSPITAL_COMMUNITY)
Admission: RE | Disposition: A | Discharge status: Home / Self Care | Payer: Self-pay | Source: Ambulatory Visit | Attending: General Surgery

## 2014-07-06 ENCOUNTER — Encounter (HOSPITAL_COMMUNITY): Payer: Managed Care, Other (non HMO) | Admitting: Anesthesiology

## 2014-07-06 ENCOUNTER — Encounter (HOSPITAL_COMMUNITY): Payer: Self-pay | Admitting: *Deleted

## 2014-07-06 DIAGNOSIS — H409 Unspecified glaucoma: Secondary | ICD-10-CM | POA: Insufficient documentation

## 2014-07-06 DIAGNOSIS — C50412 Malignant neoplasm of upper-outer quadrant of left female breast: Secondary | ICD-10-CM

## 2014-07-06 DIAGNOSIS — E039 Hypothyroidism, unspecified: Secondary | ICD-10-CM | POA: Insufficient documentation

## 2014-07-06 DIAGNOSIS — C50919 Malignant neoplasm of unspecified site of unspecified female breast: Secondary | ICD-10-CM | POA: Diagnosis not present

## 2014-07-06 HISTORY — PX: PORTACATH PLACEMENT: SHX2246

## 2014-07-06 SURGERY — INSERTION, TUNNELED CENTRAL VENOUS DEVICE, WITH PORT
Anesthesia: General | Site: Chest | Laterality: Right

## 2014-07-06 MED ORDER — LIDOCAINE HCL 0.5 % IJ SOLN
INTRAMUSCULAR | Status: AC
  Filled 2014-07-06: qty 1

## 2014-07-06 MED ORDER — BUPIVACAINE-EPINEPHRINE (PF) 0.25% -1:200000 IJ SOLN
INTRAMUSCULAR | Status: AC
  Filled 2014-07-06: qty 30

## 2014-07-06 MED ORDER — PROPOFOL 10 MG/ML IV BOLUS
INTRAVENOUS | Status: AC
  Filled 2014-07-06: qty 20

## 2014-07-06 MED ORDER — BUPIVACAINE-EPINEPHRINE 0.25% -1:200000 IJ SOLN
INTRAMUSCULAR | Status: DC | PRN
  Administered 2014-07-06: 10 mL

## 2014-07-06 MED ORDER — KETOROLAC TROMETHAMINE 30 MG/ML IJ SOLN: 15.0000 mg | Freq: Once | INTRAMUSCULAR | Status: DC | PRN

## 2014-07-06 MED ORDER — HEPARIN SOD (PORK) LOCK FLUSH 100 UNIT/ML IV SOLN
INTRAVENOUS | Status: DC | PRN
  Administered 2014-07-06: 500 [IU]

## 2014-07-06 MED ORDER — SODIUM CHLORIDE 0.9 % IR SOLN
Freq: Once | Status: AC
  Administered 2014-07-06: 08:00:00
  Filled 2014-07-06: qty 1.2

## 2014-07-06 MED ORDER — PROMETHAZINE HCL 25 MG/ML IJ SOLN: 6.2500 mg | INTRAMUSCULAR | Status: DC | PRN

## 2014-07-06 MED ORDER — FENTANYL CITRATE 0.05 MG/ML IJ SOLN
INTRAMUSCULAR | Status: AC
  Filled 2014-07-06: qty 2

## 2014-07-06 MED ORDER — ONDANSETRON HCL 4 MG/2ML IJ SOLN
INTRAMUSCULAR | Status: DC | PRN
  Administered 2014-07-06: 4 mg via INTRAVENOUS

## 2014-07-06 MED ORDER — MIDAZOLAM HCL 2 MG/2ML IJ SOLN
INTRAMUSCULAR | Status: AC
  Filled 2014-07-06: qty 2

## 2014-07-06 MED ORDER — DEXAMETHASONE SODIUM PHOSPHATE 10 MG/ML IJ SOLN
INTRAMUSCULAR | Status: DC | PRN
  Administered 2014-07-06: 10 mg via INTRAVENOUS

## 2014-07-06 MED ORDER — CEFAZOLIN SODIUM-DEXTROSE 2-3 GM-% IV SOLR
INTRAVENOUS | Status: AC
  Filled 2014-07-06: qty 50

## 2014-07-06 MED ORDER — 0.9 % SODIUM CHLORIDE (POUR BTL) OPTIME
TOPICAL | Status: DC | PRN
  Administered 2014-07-06: 1000 mL

## 2014-07-06 MED ORDER — PROPOFOL 10 MG/ML IV BOLUS
INTRAVENOUS | Status: DC | PRN
  Administered 2014-07-06: 200 mg via INTRAVENOUS

## 2014-07-06 MED ORDER — EPHEDRINE SULFATE 50 MG/ML IJ SOLN
INTRAMUSCULAR | Status: DC | PRN
  Administered 2014-07-06: 7.5 mg via INTRAVENOUS
  Administered 2014-07-06: 5 mg via INTRAVENOUS

## 2014-07-06 MED ORDER — CEFAZOLIN SODIUM-DEXTROSE 2-3 GM-% IV SOLR
2.0000 g | INTRAVENOUS | Status: AC
  Administered 2014-07-06: 2 g via INTRAVENOUS

## 2014-07-06 MED ORDER — METOCLOPRAMIDE HCL 5 MG/ML IJ SOLN
INTRAMUSCULAR | Status: DC | PRN
  Administered 2014-07-06: 10 mg via INTRAVENOUS

## 2014-07-06 MED ORDER — HEPARIN SOD (PORK) LOCK FLUSH 100 UNIT/ML IV SOLN
INTRAVENOUS | Status: AC
  Filled 2014-07-06: qty 5

## 2014-07-06 MED ORDER — LIDOCAINE HCL 1 % IJ SOLN
INTRAMUSCULAR | Status: DC | PRN
  Administered 2014-07-06: 50 mg via INTRADERMAL

## 2014-07-06 MED ORDER — FENTANYL CITRATE 0.05 MG/ML IJ SOLN
INTRAMUSCULAR | Status: DC | PRN
  Administered 2014-07-06: 100 ug via INTRAVENOUS

## 2014-07-06 MED ORDER — BUPIVACAINE HCL (PF) 0.5 % IJ SOLN
INTRAMUSCULAR | Status: AC
  Filled 2014-07-06: qty 30

## 2014-07-06 MED ORDER — FENTANYL CITRATE 0.05 MG/ML IJ SOLN: 25.0000 ug | INTRAMUSCULAR | Status: DC | PRN

## 2014-07-06 MED ORDER — MIDAZOLAM HCL 5 MG/5ML IJ SOLN
INTRAMUSCULAR | Status: DC | PRN
  Administered 2014-07-06: 2 mg via INTRAVENOUS

## 2014-07-06 MED ORDER — LIDOCAINE HCL (CARDIAC) 20 MG/ML IV SOLN
INTRAVENOUS | Status: AC
  Filled 2014-07-06: qty 5

## 2014-07-06 MED ORDER — HYDROCODONE-ACETAMINOPHEN 5-325 MG PO TABS: 1.0000 | ORAL_TABLET | Freq: Four times a day (QID) | ORAL | Status: DC | PRN

## 2014-07-06 MED ORDER — LACTATED RINGERS IV SOLN
INTRAVENOUS | Status: DC | PRN
  Administered 2014-07-06 (×2): via INTRAVENOUS

## 2014-07-06 MED ORDER — SODIUM BICARBONATE 4 % IV SOLN
INTRAVENOUS | Status: AC
  Filled 2014-07-06: qty 5

## 2014-07-06 SURGICAL SUPPLY — 34 items
ADH SKN CLS APL DERMABOND .7 (GAUZE/BANDAGES/DRESSINGS) ×1
APL SKNCLS STERI-STRIP NONHPOA (GAUZE/BANDAGES/DRESSINGS) ×1
BAG DECANTER FOR FLEXI CONT (MISCELLANEOUS) ×2 IMPLANT
BENZOIN TINCTURE PRP APPL 2/3 (GAUZE/BANDAGES/DRESSINGS) ×2 IMPLANT
BLADE HEX COATED 2.75 (ELECTRODE) ×2 IMPLANT
BLADE SURG 15 STRL LF DISP TIS (BLADE) ×1 IMPLANT
BLADE SURG 15 STRL SS (BLADE) ×2
DECANTER SPIKE VIAL GLASS SM (MISCELLANEOUS) ×2 IMPLANT
DERMABOND ADVANCED (GAUZE/BANDAGES/DRESSINGS) ×1
DERMABOND ADVANCED .7 DNX12 (GAUZE/BANDAGES/DRESSINGS) IMPLANT
DRAPE C-ARM 42X120 X-RAY (DRAPES) ×2 IMPLANT
DRAPE LAPAROTOMY TRNSV 102X78 (DRAPE) ×2 IMPLANT
ELECT REM PT RETURN 9FT ADLT (ELECTROSURGICAL) ×2
ELECTRODE REM PT RTRN 9FT ADLT (ELECTROSURGICAL) ×1 IMPLANT
GAUZE SPONGE 4X4 12PLY STRL (GAUZE/BANDAGES/DRESSINGS) ×2 IMPLANT
GAUZE SPONGE 4X4 16PLY XRAY LF (GAUZE/BANDAGES/DRESSINGS) ×2 IMPLANT
GLOVE BIOGEL PI IND STRL 7.0 (GLOVE) ×1 IMPLANT
GLOVE BIOGEL PI INDICATOR 7.0 (GLOVE) ×1
GOWN STRL REUS W/TWL XL LVL3 (GOWN DISPOSABLE) ×4 IMPLANT
KIT BASIN OR (CUSTOM PROCEDURE TRAY) ×2 IMPLANT
KIT PORT POWER 8FR ISP CVUE (Catheter) ×1 IMPLANT
MARKER SKIN DUAL TIP RULER LAB (MISCELLANEOUS) ×2 IMPLANT
NDL HYPO 25X1 1.5 SAFETY (NEEDLE) ×1 IMPLANT
NEEDLE HYPO 22GX1.5 SAFETY (NEEDLE) IMPLANT
NEEDLE HYPO 25X1 1.5 SAFETY (NEEDLE) ×2 IMPLANT
NS IRRIG 1000ML POUR BTL (IV SOLUTION) ×2 IMPLANT
PACK BASIC VI WITH GOWN DISP (CUSTOM PROCEDURE TRAY) ×2 IMPLANT
PENCIL BUTTON HOLSTER BLD 10FT (ELECTRODE) ×2 IMPLANT
STRIP CLOSURE SKIN 1/2X4 (GAUZE/BANDAGES/DRESSINGS) IMPLANT
SUT MNCRL AB 4-0 PS2 18 (SUTURE) ×2 IMPLANT
SUT PROLENE 2 0 CT2 30 (SUTURE) ×2 IMPLANT
SYR CONTROL 10ML LL (SYRINGE) ×2 IMPLANT
SYRINGE 10CC LL (SYRINGE) ×4 IMPLANT
TOWEL OR 17X26 10 PK STRL BLUE (TOWEL DISPOSABLE) ×2 IMPLANT

## 2014-07-06 NOTE — Progress Notes (Signed)
Portable Upright Chest X-ray done. 

## 2014-07-06 NOTE — H&P (View-Only) (Signed)
Chief complaint: Followup for treatment planning for new diagnosis of breast cancer  History: Patient was recently diagnosed with cancer of the left breast. She recently presented for a screening mamogram revealing Abnormal calcifications in the left breast.. Subsequent imaging included diagnostic mamogram showing A 5 cm segmental area of coarse pleomorphic calcifications in the upper outer quadrant of the left breast. 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 There was however noted a few areas of apparent lymphatic invasion and Some concern was raised regarding invasive disease. Subsequent breast MRI was performed revealing a poorly defined area of enhancement corresponding to the location of the calcifications measuring 21 x 30 mm. In addition were noted numerous enlarged level I and level II left axillary lymph nodes with severe cortical thickening. Also noted were some hyperintense liver lesions which could not be fully characterized.She was seen in breast multidisciplinary clinic for her initial appointment. At that time she was found to have a palpable left axillary adenopathy. Based on this imaging we recommended ultrasound-guided biopsy of her left axillary lymph nodes. This was just recently performed and has returned showing metastatic invasive ductal carcinoma.  Exam: Breast exam not repeated today, one week ago showed:  In the upper outer quadrant of the left breast is a somewhat soft and indistinct mass which feels to measure about 5 cm in greatest dimension anterior to posterior. There is at least one clearly enlarged firm lymph node which is movable palpable in the left axilla. Right breast and axilla are negative.  Lymph nodes: No cervical, supraclavicular, or inguinal nodes palp  Assessment and plan: Based on this recent biopsy her cancer is now stage IIb with a fairly large breast primary. I believe neoadjuvant  chemotherapy would offer some increased chance of breast conservation if we could get any shrinkage of her primary tumor. I recommended proceeding with neoadjuvant chemotherapy and will discuss this with medical oncology. If they are in agreement will plan Port-A-Cath placement.  I discussed this procedure with the patient including its indications and nature and risks of bleeding, infection, pneumothorax, catheter displacement or malfunction and a DVT. She understands and agrees to proceed.

## 2014-07-06 NOTE — Transfer of Care (Signed)
Immediate Anesthesia Transfer of Care Note  Patient: Kimberly Reed  Procedure(s) Performed: Procedure(s) (LRB): PORT A CATH  PLACEMENT (Right)  Patient Location: PACU  Anesthesia Type: General  Level of Consciousness: sedated, patient cooperative and responds to stimulation  Airway & Oxygen Therapy: Patient Spontanous Breathing and Patient connected to face mask oxgen  Post-op Assessment: Report given to PACU RN and Post -op Vital signs reviewed and stable  Post vital signs: Reviewed and stable  Complications: No apparent anesthesia complications

## 2014-07-06 NOTE — Interval H&P Note (Signed)
History and Physical Interval Note:  07/06/2014 7:09 AM  Kimberly Reed  has presented today for surgery, with the diagnosis of cancer breast  The various methods of treatment have been discussed with the patient and family. After consideration of risks, benefits and other options for treatment, the patient has consented to  Procedure(s): PORT A CATH  PLACEMENT (N/A) as a surgical intervention .  The patient's history has been reviewed, patient examined, no change in status, stable for surgery.  I have reviewed the patient's chart and labs.  Questions were answered to the patient's satisfaction.     [name]

## 2014-07-06 NOTE — Progress Notes (Signed)
X-ray results noted 

## 2014-07-06 NOTE — Discharge Instructions (Signed)
    PORT-A-CATH: POST OP INSTRUCTIONS  Always review your discharge instruction sheet given to you by the facility where your surgery was performed.   1. A prescription for pain medication may be given to you upon discharge. Take your pain medication as prescribed, if needed. If narcotic pain medicine is not needed, then you make take acetaminophen (Tylenol) or ibuprofen (Advil) as needed.  2. Take your usually prescribed medications unless otherwise directed. 3. If you need a refill on your pain medication, please contact our office. All narcotic pain medicine now requires a paper prescription.  Phoned in and fax refills are no longer allowed by law.  Prescriptions will not be filled after 5 pm or on weekends.  4. You should follow a light diet for the remainder of the day after your procedure. 5. Most patients will experience some mild swelling and/or bruising in the area of the incision. It may take several days to resolve. 6. It is common to experience some constipation if taking pain medication after surgery. Increasing fluid intake and taking a stool softener (such as Colace) will usually help or prevent this problem from occurring. A mild laxative (Milk of Magnesia or Miralax) should be taken according to package directions if there are no bowel movements after 48 hours.  7. Unless discharge instructions indicate otherwise, you may remove your bandages 48 hours after surgery, and you may shower at that time. You may have steri-strips (small white skin tapes) in place directly over the incision.  These strips should be left on the skin for 7-10 days.  If your surgeon used Dermabond (skin glue) on the incision, you may shower in 24 hours.  The glue will flake off over the next 2-3 weeks.  8. If your port is left accessed at the end of surgery (needle left in port), the dressing cannot get wet and should only by changed by a healthcare professional. When the port is no longer accessed (when the  needle has been removed), follow step 7.   9. ACTIVITIES:  Limit activity involving your arms for the next 72 hours. Do no strenuous exercise or activity for 1 week. You may drive when you are no longer taking prescription pain medication, you can comfortably wear a seatbelt, and you can maneuver your car. 10.You may need to see your doctor in the office for a follow-up appointment.  Please       check with your doctor.  11.When you receive a new Port-a-Cath, you will get a product guide and        ID card.  Please keep them in case you need them.  WHEN TO CALL YOUR DOCTOR ([phone]): 1. Fever over 101.0 2. Chills 3. Continued bleeding from incision 4. Increased redness and tenderness at the site 5. Shortness of breath, difficulty breathing   The clinic staff is available to answer your questions during regular business hours. Please don't hesitate to call and ask to speak to one of the nurses or medical assistants for clinical concerns. If you have a medical emergency, go to the nearest emergency room or call 911.  A surgeon from [facility] is always on call at the hospital.     For further information, please visit [email or web address]      

## 2014-07-06 NOTE — Anesthesia Postprocedure Evaluation (Signed)
  Anesthesia Post-op Note  Patient: Kimberly Reed  Procedure(s) Performed: Procedure(s) (LRB): PORT A CATH  PLACEMENT (Right)  Patient Location: PACU  Anesthesia Type: General  Level of Consciousness: awake and alert   Airway and Oxygen Therapy: Patient Spontanous Breathing  Post-op Pain: mild  Post-op Assessment: Post-op Vital signs reviewed, Patient's Cardiovascular Status Stable, Respiratory Function Stable, Patent Airway and No signs of Nausea or vomiting  Last Vitals:  Filed Vitals:   07/06/14 0900  BP:   Pulse: 61  Temp:   Resp: 12    Post-op Vital Signs: stable   Complications: No apparent anesthesia complications

## 2014-07-06 NOTE — Op Note (Signed)
Preoperative diagnosis: Cancer of the breast and the poor venous access  Postoperative diagnosis: Same  Procedure: Placement of ClearVue subcutaneous venous port  Surgeon: [name] M.D.  Anesthesia: LMA general  Description of procedure: Patient is brought to the operating room and placed in the supine position on the operating table. IV sedation was administered. The entire upper chest and neck were widely sterilely prepped and draped. Local anesthesia was used to infiltrate the insertion of port site. The right subclavian vein was cannulated with a needle and guidewire without difficulty and position in the superior vena cava was confirmed by fluoroscopy. The introducer was then placed over the guidewire and the flushed catheter placed via the introducer which was stripped away and the tip of the catheter positioned near the cavoatrial junction. A small transverse incision was made in the anterior chest wall and subcutaneous pocket created. The catheter was tunneled into the pocket, trimmed to length, and attached to the flushed port which was positioned in the pocket. The port was sutured to the chest wall with interrupted 2-0 Prolene. The incisions were closed with subcutaneous interrupted Monocryl and the skin incisions closed with subcuticular Monocryl and Dermabond. The port was accessed and flushed and aspirated easily and was left flushed with concentrated heparin solution. Sponge needle as the counts were correct. The patient was taken to recovery in good condition.  Rocio Wolak T  07/06/2014

## 2014-07-09 ENCOUNTER — Encounter (HOSPITAL_COMMUNITY): Payer: Self-pay | Admitting: General Surgery

## 2014-07-10 ENCOUNTER — Telehealth: Payer: Self-pay

## 2014-07-10 ENCOUNTER — Telehealth (HOSPITAL_COMMUNITY): Payer: Self-pay | Admitting: Interventional Radiology

## 2014-07-10 NOTE — Telephone Encounter (Signed)
Called pt, left VM for her to call to schedule L1 bx JM

## 2014-07-10 NOTE — Telephone Encounter (Signed)
Vivien Rota  Returned my call re: CT biopsy: Dr. Bartholome Bill wants Dr. Patrecia Pour in IR to do the biopsy, has been sent to [facility] in IR to schedule.  Spoke with Anderson Malta in [employer], she has no record of receiving request.  She verified order with me and said she will call patient before the end of the day to schedule.   Called pt and let her know she should be hearing from IR before the end of the day, if not - she should call me tomorrow and leave a message.  Let pt know letter for Y was sent already.   Pt voiced understanding.

## 2014-07-12 ENCOUNTER — Other Ambulatory Visit: Payer: Self-pay

## 2014-07-12 ENCOUNTER — Telehealth: Payer: Self-pay | Admitting: Hematology and Oncology

## 2014-07-12 ENCOUNTER — Other Ambulatory Visit: Payer: Self-pay | Admitting: Radiology

## 2014-07-12 NOTE — Telephone Encounter (Signed)
, °

## 2014-07-16 ENCOUNTER — Other Ambulatory Visit: Payer: Managed Care, Other (non HMO)

## 2014-07-16 ENCOUNTER — Other Ambulatory Visit: Payer: Self-pay | Admitting: Radiology

## 2014-07-17 ENCOUNTER — Other Ambulatory Visit (HOSPITAL_COMMUNITY): Payer: Self-pay | Admitting: Interventional Radiology

## 2014-07-17 ENCOUNTER — Ambulatory Visit (HOSPITAL_COMMUNITY): Admission: RE | Admit: 2014-07-17 | Payer: Managed Care, Other (non HMO) | Source: Ambulatory Visit

## 2014-07-17 DIAGNOSIS — C50919 Malignant neoplasm of unspecified site of unspecified female breast: Secondary | ICD-10-CM

## 2014-07-18 ENCOUNTER — Other Ambulatory Visit: Payer: Self-pay | Admitting: *Deleted

## 2014-07-18 ENCOUNTER — Other Ambulatory Visit: Payer: Self-pay | Admitting: Radiology

## 2014-07-18 ENCOUNTER — Telehealth: Payer: Self-pay | Admitting: *Deleted

## 2014-07-18 ENCOUNTER — Ambulatory Visit (HOSPITAL_COMMUNITY)
Admission: RE | Admit: 2014-07-18 | Discharge: 2014-07-18 | Disposition: A | Discharge status: Home / Self Care | Payer: Managed Care, Other (non HMO) | Source: Ambulatory Visit | Attending: Interventional Radiology | Admitting: Interventional Radiology

## 2014-07-18 DIAGNOSIS — M999 Biomechanical lesion, unspecified: Secondary | ICD-10-CM | POA: Diagnosis present

## 2014-07-18 DIAGNOSIS — C50919 Malignant neoplasm of unspecified site of unspecified female breast: Secondary | ICD-10-CM

## 2014-07-18 DIAGNOSIS — C50412 Malignant neoplasm of upper-outer quadrant of left female breast: Secondary | ICD-10-CM

## 2014-07-18 MED ORDER — ALPRAZOLAM 0.5 MG PO TABS: 0.5000 mg | ORAL_TABLET | Freq: Three times a day (TID) | ORAL | Status: DC | PRN

## 2014-07-18 NOTE — Telephone Encounter (Signed)
Left VM letting patient know that Xanax was called into her pharmacy.

## 2014-07-19 ENCOUNTER — Telehealth: Payer: Self-pay | Admitting: Hematology and Oncology

## 2014-07-19 ENCOUNTER — Ambulatory Visit (HOSPITAL_COMMUNITY)
Admission: RE | Admit: 2014-07-19 | Discharge: 2014-07-19 | Disposition: A | Discharge status: Home / Self Care | Payer: Managed Care, Other (non HMO) | Source: Ambulatory Visit | Attending: Hematology and Oncology | Admitting: Hematology and Oncology

## 2014-07-19 ENCOUNTER — Other Ambulatory Visit: Payer: Self-pay | Admitting: *Deleted

## 2014-07-19 DIAGNOSIS — C7951 Secondary malignant neoplasm of bone: Secondary | ICD-10-CM | POA: Diagnosis not present

## 2014-07-19 DIAGNOSIS — C50412 Malignant neoplasm of upper-outer quadrant of left female breast: Secondary | ICD-10-CM

## 2014-07-19 DIAGNOSIS — C7952 Secondary malignant neoplasm of bone marrow: Principal | ICD-10-CM

## 2014-07-19 DIAGNOSIS — C50919 Malignant neoplasm of unspecified site of unspecified female breast: Secondary | ICD-10-CM | POA: Insufficient documentation

## 2014-07-19 DIAGNOSIS — M899 Disorder of bone, unspecified: Secondary | ICD-10-CM | POA: Diagnosis present

## 2014-07-19 LAB — BASIC METABOLIC PANEL
ANION GAP: 11 (ref 5–15)
BUN: 20 mg/dL (ref 6–23)
CO2: 25 meq/L (ref 19–32)
Calcium: 9.9 mg/dL (ref 8.4–10.5)
Chloride: 105 mEq/L (ref 96–112)
Creatinine, Ser: 0.61 mg/dL (ref 0.50–1.10)
GFR calc Af Amer: >90 mL/min (ref >90)
GFR calc non Af Amer: >90 mL/min (ref >90)
GLUCOSE: 101 mg/dL — AB (ref 70–99)
POTASSIUM: 4.1 meq/L (ref 3.7–5.3)
SODIUM: 141 meq/L (ref 137–147)

## 2014-07-19 LAB — CBC
HEMATOCRIT: 33.5 % — AB (ref 36.0–46.0)
Hemoglobin: 11.3 g/dL — ABNORMAL LOW (ref 12.0–15.0)
MCH: 29.3 pg (ref 26.0–34.0)
MCHC: 33.7 g/dL (ref 30.0–36.0)
MCV: 86.8 fL (ref 78.0–100.0)
Platelets: 341 10*3/uL (ref 150–400)
RBC: 3.86 MIL/uL — ABNORMAL LOW (ref 3.87–5.11)
RDW: 13.3 % (ref 11.5–15.5)
WBC: 5.3 10*3/uL (ref 4.0–10.5)

## 2014-07-19 LAB — APTT: aPTT: 30 seconds (ref 24–37)

## 2014-07-19 LAB — PROTIME-INR
INR: 1.02 (ref 0.00–1.49)
Prothrombin Time: 13.4 seconds (ref 11.6–15.2)

## 2014-07-19 MED ORDER — MIDAZOLAM HCL 2 MG/2ML IJ SOLN
INTRAMUSCULAR | Status: AC | PRN
  Administered 2014-07-19 (×2): 1 mg via INTRAVENOUS

## 2014-07-19 MED ORDER — FENTANYL CITRATE 0.05 MG/ML IJ SOLN
INTRAMUSCULAR | Status: AC
  Filled 2014-07-19: qty 4

## 2014-07-19 MED ORDER — HYDROMORPHONE HCL 1 MG/ML IJ SOLN
INTRAMUSCULAR | Status: DC
Start: 2014-07-19 — End: 2014-07-19
  Filled 2014-07-19: qty 2

## 2014-07-19 MED ORDER — MIDAZOLAM HCL 2 MG/2ML IJ SOLN
INTRAMUSCULAR | Status: AC
  Filled 2014-07-19: qty 6

## 2014-07-19 MED ORDER — BUPIVACAINE HCL (PF) 0.25 % IJ SOLN
INTRAMUSCULAR | Status: AC
  Filled 2014-07-19: qty 30

## 2014-07-19 MED ORDER — SODIUM CHLORIDE 0.9 % IV SOLN: INTRAVENOUS | Status: AC

## 2014-07-19 MED ORDER — FENTANYL CITRATE 0.05 MG/ML IJ SOLN
INTRAMUSCULAR | Status: AC | PRN
  Administered 2014-07-19: 12.5 ug via INTRAVENOUS
  Administered 2014-07-19 (×3): 25 ug via INTRAVENOUS

## 2014-07-19 MED ORDER — SODIUM CHLORIDE 0.9 % IV SOLN: INTRAVENOUS | Status: DC

## 2014-07-19 NOTE — H&P (Signed)
Chief Complaint: "I'm having a biopsy"  Referring Physician(s): Gudena,Vinay K  History of Present Illness: Kimberly Reed is a 58 y.o. female with history of recently diagnosed left breast carcinoma and follow up imaging revealing abnormal marrow lesion in left side of L1 vertebral body suspicious for metastatic disease. She presents today for fluoroscopic guided biopsy of the L1 lesion.  Past Medical History  Diagnosis Date  . Hypothyroid   . Glaucoma   . Hyperlipemia   . Migraine without aura   . History of syncope 2008    loss of bladder control eval by Neuro  . Atypical chest pain   . Family history of heart disease   . Anxiety   . Depression   . Hot flashes   . Anemia   . Insomnia   . Cancer     left breast dx. 6'71- chemo planned to start 07-09-14- Dr. Lindi [facility]    Past Surgical History  Procedure Laterality Date  . Cataract extraction Left   . Glaucoma surgery Left     x1   . Examination under anesthesia  02/24/2013    Procedure: EXAM UNDER ANESTHESIA;  Surgeon: Azalia Bilis, MD;  Location: [facility];  Service: Gynecology;;  with removal of prolapsing cervical mass; pap smear and endometrial biopsy  . Uterine polyp removed      per hysteroscopy about 1 1/2 yrs ago.  . Esophagogastroduodenoscopy  06/01/14  . Tonsillectomy      child  . Portacath placement Right 07/06/2014    Procedure: PORT A CATH  PLACEMENT;  Surgeon: [name], MD;  Location: [facility];  Service: General;  Laterality: Right;    Allergies: Other and Effexor  Medications: Prior to Admission medications   Medication Sig Start Date End Date Taking? Authorizing Provider  acidophilus (RISAQUAD) CAPS capsule Take 1 capsule by mouth daily.    Historical Provider, MD  ALPRAZolam Duanne Moron) 0.5 MG tablet Take 1 tablet (0.5 mg total) by mouth 3 (three) times daily as needed for anxiety. 07/18/14   Rulon Eisenmenger, MD  ARIPiprazole (ABILIFY) 10 MG tablet Take 5 mg by mouth every  morning.     Historical Provider, MD  brimonidine-timolol (COMBIGAN) 0.2-0.5 % ophthalmic solution Place 1 drop into the right eye every 12 (twelve) hours.     Historical Provider, MD  Cholecalciferol (VITAMIN D-3) 5000 UNITS TABS Take 1 tablet by mouth daily.    Historical Provider, MD  dorzolamide (TRUSOPT) 2 % ophthalmic solution Place 1 drop into the right eye 2 (two) times daily.     Historical Provider, MD  Flaxseed, Linseed, (FLAX SEED OIL PO) Take 1 capsule by mouth daily.    Historical Provider, MD  HYDROcodone-acetaminophen (NORCO) 5-325 MG per tablet Take 1-2 tablets by mouth every 6 (six) hours as needed. 07/06/14   [name], MD  LORazepam (ATIVAN) 1 MG tablet Take 1 tablet (1 mg total) by mouth 2 (two) times daily as needed for anxiety. 07/04/14   Rulon Eisenmenger, MD  MELATONIN PO Take 1 tablet by mouth daily.     Historical Provider, MD  Multiple Vitamin (MULTIVITAMIN WITH MINERALS) TABS Take 1 tablet by mouth daily.    Historical Provider, MD  Omega-3 Fatty Acids (OMEGA 3 PO) Take 1 capsule by mouth daily.    Historical Provider, MD  OVER THE COUNTER MEDICATION Take 1 capsule by mouth daily.    Historical Provider, MD  thyroid (ARMOUR THYROID) 180 MG tablet Take 1 tablet (  180 mg total) by mouth daily before breakfast. 05/29/14   Azalia Bilis, MD  Travoprost, BAK Free, (TRAVATAN) 0.004 % SOLN ophthalmic solution Place 1 drop into the right eye at bedtime.     Historical Provider, MD  WHEY PROTEIN PO Take 1 scoop by mouth daily.    Historical Provider, MD    Family History  Problem Relation Age of Onset  . Aneurysm Mother   . Kidney disease Mother   . Hypertension Father   . Dementia Father   . Kidney disease Brother   . Heart disease      "all of moms side"  . Heart failure Cousin   . Depression      History   Social History  . Marital Status: Single    Spouse Name: N/A    Number of Children: N/A  . Years of Education: N/A   Occupational History  . customer  service [employer] History Main Topics  . Smoking status: Never Smoker   . Smokeless tobacco: Never Used  . Alcohol Use: No  . Drug Use: No     Comment: in 20's but not now  . Sexual Activity: Yes    Partners: Female   Other Topics Concern  . Not on file   Social History Narrative   Social :  [place]      For over a year.     [place]  2 cats    No tad    8 hours of sleep.                Review of Systems  Constitutional: Negative for fever and chills.  Respiratory: Negative for cough and shortness of breath.   Cardiovascular: Negative for chest pain.  Gastrointestinal: Negative for nausea, vomiting, abdominal pain and blood in stool.  Genitourinary: Negative for dysuria and hematuria.  Musculoskeletal: Negative for back pain.  Neurological: Negative for headaches.  Hematological: Does not bruise/bleed easily.    Vital Signs: BP 133/71  Pulse 65  Temp(Src) 97.5 F (36.4 C)  Resp 18  Ht 5\' 5"  (1.651 m)  Wt 195 lb (88.451 kg)  BMI 32.45 kg/m2  SpO2 100%  LMP 02/15/2013  Physical Exam  Constitutional: She is oriented to person, place, and time. She appears well-developed and well-nourished.  Cardiovascular: Normal rate and regular rhythm.   Pulmonary/Chest: Effort normal and breath sounds normal.  Clean, intact rt chest wall PAC  Abdominal: Soft. Bowel sounds are normal. There is no tenderness.  Musculoskeletal: Normal range of motion. She exhibits no edema.  Neurological: She is alert and oriented to person, place, and time.    Imaging: Mr Lumbar Spine Wo Contrast  07/18/2014   CLINICAL DATA:  Lumbar spine lesion  EXAM: MRI LUMBAR SPINE WITHOUT CONTRAST  TECHNIQUE: Multiplanar, multisequence MR imaging of the lumbar spine was performed. No intravenous contrast was administered.  COMPARISON:  PET-CT 06/13/2014  FINDINGS: The vertebral bodies of the lumbar spine are normal in size. The vertebral bodies of the lumbar spine are  normal in alignment. There is an L1 vertebral body T1 hypo intense and T2 hyperintense lesion along the left side of the vertebral body. There is a T1 and T2 hyperintense lesion in the L2 and L5 vertebral bodies most consistent with hemangiomas. The intervertebral disc spaces are well-maintained.  The spinal cord is normal in signal and contour. The cord terminates normally at L1 . The nerve roots of the  cauda equina and the filum terminale are normal.  The visualized portions of the SI joints are unremarkable.  The imaged intra-abdominal contents are unremarkable.  T11-T12: Mild broad-based disc bulge. No foraminal or central canal narrowing.  T12-L1: No significant disc bulge. No evidence of neural foraminal stenosis. No central canal stenosis.  L1-L2: No significant disc bulge. No evidence of neural foraminal stenosis. No central canal stenosis.  L2-L3: No significant disc bulge. No evidence of neural foraminal stenosis. No central canal stenosis.  L3-L4: Mild broad-based disc bulge. Mild bilateral facet arthropathy. No evidence of neural foraminal stenosis. No central canal stenosis.  L4-L5: Mild broad-based disc bulge. Moderate left mild right facet arthropathy with a small left facet intraspinal synovial cyst measuring 10.5 mm. No evidence of neural foraminal stenosis. Mild spinal stenosis.  L5-S1: No significant disc bulge. No evidence of neural foraminal stenosis. No central canal stenosis.  IMPRESSION: 1. Abnormal marrow lesion in the left side of the L1 vertebral body most concerning for metastatic disease given the left breast lesion. 2. At L4-5 there is a mild broad-based disc bulge. Moderate left mild right facet arthropathy with a small left facet intraspinal synovial cyst measuring 10.5 mm. 3. At L3-4 there is a mild broad-based disc bulge mild bilateral facet arthropathy.   Electronically Signed   By: Kathreen Devoid   On: 07/18/2014 08:51   Dg Chest Port 1 View  07/06/2014   CLINICAL DATA:  Status  post port placement  EXAM: PORTABLE CHEST - 1 VIEW  COMPARISON:  None.  FINDINGS: Cardiac shadow is at the upper limits of normal in size. A right-sided chest wall port is seen with catheter tip in the distal superior vena cava. No pneumothorax is noted. No focal infiltrate is seen.  IMPRESSION: No pneumothorax following port placement.   Electronically Signed   By: Inez Catalina M.D.   On: 07/06/2014 09:01   Dg C-arm 1-60 Min-no Report  07/12/2014   : Fluoroscopy was utilized by the requesting physician. No radiographic interpretation.   Electronically Signed   By: Suella Grove  Services   On: 07/12/2014 12:00    Labs: Lab Results  Component Value Date   WBC 5.5 06/06/2014   HCT 32.7* 06/06/2014   MCV 91.2 06/06/2014   PLT 292 06/06/2014   NA 140 06/06/2014   K 4.4 06/06/2014   CL 103 06/06/2014   CO2 27 06/06/2014   GLUCOSE 106* 06/06/2014   BUN 10 06/06/2014   CREATININE 0.68 06/06/2014   CALCIUM 10.2 06/06/2014   PROT 7.4 06/06/2014   ALBUMIN 3.7 06/06/2014   AST 14 06/06/2014   ALT 12 06/06/2014   ALKPHOS 53 06/06/2014   BILITOT 0.3 06/06/2014   GFRNONAA 79 07/24/2008   GFRAA 96 07/24/2008    Assessment and Plan:  Kimberly Reed is a 58 y.o. female with history of recently diagnosed left breast carcinoma and follow up imaging revealing abnormal marrow lesion in left side of L1 vertebral body suspicious for metastatic disease. She presents today for fluoroscopic guided biopsy of the L1 lesion. Details/risks of procedure d/w pt with her understanding and consent.         Signed: Autumn Messing 07/19/2014, 10:23 AM

## 2014-07-19 NOTE — Discharge Instructions (Signed)
Needle Biopsy °Care After °These instructions give you information on caring for yourself after your procedure. Your doctor may also give you more specific instructions. Call your doctor if you have any problems or questions after your procedure. °HOME CARE °· Rest for 4 hours after your biopsy, except for getting up to go to the bathroom or as told. °· Keep the places where the needles were put in clean and dry. °¨ Do not put powder or lotion on the sites. °¨ Do not shower until 24 hours after the test. Remove all bandages (dressings) before showering. °¨ Remove all bandages at least once every day. Gently clean the sites with soap and water. Keep putting a new bandage on until the skin is closed. °Finding out the results of your test °Ask your doctor when your test results will be ready. Make sure you follow up and get the test results. °GET HELP RIGHT AWAY IF:  °· You have shortness of breath or trouble breathing. °· You have pain or cramping in your belly (abdomen). °· You feel sick to your stomach (nauseous) or throw up (vomit). °· Any of the places where the needles were put in: °¨ Are puffy (swollen) or red. °¨ Are sore or hot to the touch. °¨ Are draining yellowish-white fluid (pus). °¨ Are bleeding after 10 minutes of pressing down on the site. Have someone keep pressing on any place that is bleeding until you see a doctor. °· You have any unusual pain that will not stop. °· You have a fever. °If you go to the emergency room, tell the nurse that you had a biopsy. Take this paper with you to show the nurse. °MAKE SURE YOU:  °· Understand these instructions. °· Will watch your condition. °· Will get help right away if you are not doing well or get worse. °Document Released: 09/24/2008 Document Revised: 01/04/2012 Document Reviewed: 09/24/2008 °ExitCare® Patient Information ©2015 [employer]. This information is not intended to replace advice given to you by your health care provider. Make sure you discuss  any questions you have with your health care provider. ° °

## 2014-07-19 NOTE — Telephone Encounter (Signed)
, °

## 2014-07-19 NOTE — Sedation Documentation (Addendum)
Pts RAIR sat 99%. Patient denies pain.

## 2014-07-19 NOTE — Procedures (Signed)
S/P fluoro guided L1 core biopsy

## 2014-07-20 ENCOUNTER — Telehealth: Payer: Self-pay | Admitting: *Deleted

## 2014-07-20 NOTE — Telephone Encounter (Signed)
Per staff phone call and POF I have schedueld appts. Scheduler advised of appts.  JMW  

## 2014-07-24 ENCOUNTER — Telehealth: Payer: Self-pay

## 2014-07-24 ENCOUNTER — Other Ambulatory Visit: Payer: Self-pay

## 2014-07-24 ENCOUNTER — Ambulatory Visit (HOSPITAL_COMMUNITY): Payer: Managed Care, Other (non HMO)

## 2014-07-24 DIAGNOSIS — C50412 Malignant neoplasm of upper-outer quadrant of left female breast: Secondary | ICD-10-CM

## 2014-07-24 NOTE — Telephone Encounter (Signed)
Returned pt call re: scrip for abilify.  Pt has prescription for 30 days from her psych provider.  Insurance will only fill 90 day supply.  Her psych provider is out of town.  She would like Dr. Lindi Adie to provide her with a prescription for a 90 day supply.  Per pt  Message: "I have problems with depression and am out of medication".  Let pt know she needs to have her psych provider manage this med.  She states she is out of town.  I asked her to call the provider covering.  She did not understand.  I offered to call.   Her provider is FNP - Stephannie Peters [phone].  Front desk stated she spoke with pt that morning and had told pt she would have to wait until [place] returned.  I asked to speak with/leave message for the on call or supervising doctor.  I also asked that their office handle this issue with Ms. Heiney.  Front desk assured she would.

## 2014-07-24 NOTE — Telephone Encounter (Signed)
Triage Call Report from Call Nurse received.  Sent to scan.

## 2014-07-24 NOTE — Telephone Encounter (Signed)
Returned pt call - let her know biopsy results not available yet.  Moved pt lab appt to [phone] - pt voiced understanding.

## 2014-07-24 NOTE — Progress Notes (Signed)
Regimen sent to [facility] for pre-auth.   POF entered for injection appt on 10/2.

## 2014-07-25 ENCOUNTER — Ambulatory Visit: Payer: Managed Care, Other (non HMO) | Admitting: Hematology and Oncology

## 2014-07-25 VITALS — BP 126/66 | HR 62 | Temp 98.1°F | Resp 18 | Ht 65.0 in | Wt 195.7 lb

## 2014-07-25 DIAGNOSIS — C7951 Secondary malignant neoplasm of bone: Secondary | ICD-10-CM

## 2014-07-25 DIAGNOSIS — C50419 Malignant neoplasm of upper-outer quadrant of unspecified female breast: Secondary | ICD-10-CM

## 2014-07-25 DIAGNOSIS — F4323 Adjustment disorder with mixed anxiety and depressed mood: Secondary | ICD-10-CM

## 2014-07-25 DIAGNOSIS — C773 Secondary and unspecified malignant neoplasm of axilla and upper limb lymph nodes: Secondary | ICD-10-CM

## 2014-07-25 DIAGNOSIS — Z170 Estrogen receptor positive status [ER+]: Secondary | ICD-10-CM

## 2014-07-25 DIAGNOSIS — C7952 Secondary malignant neoplasm of bone marrow: Secondary | ICD-10-CM

## 2014-07-25 DIAGNOSIS — C50412 Malignant neoplasm of upper-outer quadrant of left female breast: Secondary | ICD-10-CM

## 2014-07-25 DIAGNOSIS — F339 Major depressive disorder, recurrent, unspecified: Secondary | ICD-10-CM

## 2014-07-25 MED ORDER — LIDOCAINE-PRILOCAINE 2.5-2.5 % EX CREA: 1.0000 "application " | TOPICAL_CREAM | CUTANEOUS | Status: DC | PRN

## 2014-07-25 MED ORDER — DEXAMETHASONE 4 MG PO TABS: ORAL_TABLET | ORAL | Status: DC

## 2014-07-25 MED ORDER — ARIPIPRAZOLE 2 MG PO TABS: 2.0000 mg | ORAL_TABLET | Freq: Every day | ORAL | Status: DC

## 2014-07-25 MED ORDER — LORAZEPAM 0.5 MG PO TABS: 0.5000 mg | ORAL_TABLET | Freq: Four times a day (QID) | ORAL | Status: DC | PRN

## 2014-07-25 MED ORDER — PROCHLORPERAZINE MALEATE 10 MG PO TABS: 10.0000 mg | ORAL_TABLET | Freq: Four times a day (QID) | ORAL | Status: DC | PRN

## 2014-07-25 MED ORDER — ONDANSETRON HCL 8 MG PO TABS: 8.0000 mg | ORAL_TABLET | Freq: Two times a day (BID) | ORAL | Status: DC | PRN

## 2014-07-25 NOTE — Progress Notes (Signed)
Patient Care Team: Rachell Cipro, MD as PCP - General (Family Medicine) [facility]  Rulon Eisenmenger, MD as Consulting Physician (Hematology and Oncology) [name], MD as Consulting Physician (General Surgery) Eppie Gibson, MD as Attending Physician (Radiation Oncology)  DIAGNOSIS: Breast cancer of upper-outer quadrant of left female breast   Primary site: Breast (Left)   Staging method: AJCC 7th Edition   Clinical: Stage IIB (T2, N1, cM0)   Summary: Stage IIB (T2, N1, cM0)   Clinical comments: Staged at breast conference 06/06/14.   SUMMARY OF ONCOLOGIC HISTORY:   Breast cancer of upper-outer quadrant of left female breast   05/18/2014 Mammogram Suspicious left upper outer quadrant 5 cm segmental area of calcifications.    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   05/30/2014 Breast MRI Left Breast: 12oclock: 21 x 23 x 30 mm, Numerous  level 1 and 2 LN; Liver lesions cycts by Liver MRI   06/07/2014 Initial Biopsy Left axillary lymph node biopsy invasive ductal carcinoma ER 100%, PR 11% Ki-67 15% HER-2 negative ratio 1.41   06/13/2014 PET scan and metabolic she were left breast, hypermetabolic left axillary and left retropectoral lymph node, small lucent lesion in L1 vertebra which was biopsied and proven to be bone metastases   07/19/2014 Initial Biopsy Biopsy of L1 vertebra metastatic carcinoma breast primary, ER 100%, PR 4%, HER-2 negative ratio 1.3    CHIEF COMPLIANT: Patient is here to sign consent and start neoadjuvant chemotherapy  INTERVAL HISTORY: Kimberly Reed is a 58 year old Caucasian lady with above-mentioned history of left breast mass with axillary lymph node metastases biopsy proven to be metastatic breast cancer. Based on the PET CT scan she has not had any other evidence of distant metastases. Patient is being seen today to discuss starting neoadjuvant chemotherapy from  tomorrow. We presented her case in the multidisciplinary tumor board, the recommendation was to biopsy the L1 vertebra which we did and was related to be metastatic disease. She will get neoadjuvant chemotherapy followed by surgery and radiation including radiation to the L1 vertebra assuming she does not have other evidence of distant metastases by then. Patient reports that she ran out of the Abilify and feels quite depressed without it. She does not like to see her current psychiatrist and is planning to see someone else. Meanwhile she is running out of her depression medication.  REVIEW OF SYSTEMS:   Constitutional: Denies fevers, chills or abnormal weight loss Eyes: Denies blurriness of vision Ears, nose, mouth, throat, and face: Denies mucositis or sore throat Respiratory: Denies cough, dyspnea or wheezes Cardiovascular: Denies palpitation, chest discomfort or lower extremity swelling Gastrointestinal:  Denies nausea, heartburn or change in bowel habits Skin: Denies abnormal skin rashes Lymphatics: Denies new lymphadenopathy or easy bruising Neurological:Denies numbness, tingling or new weaknesses Behavioral/Psych: Maj. depression and mood changes  Breast: Lump in the breast on the left side All other systems were reviewed with the patient and are negative.  I have reviewed the past medical history, past surgical history, social history and family history with the patient and they are unchanged from previous note.  ALLERGIES:  is allergic to other and effexor.  MEDICATIONS:  Current Outpatient Prescriptions  Medication Sig Dispense Refill  . acidophilus (RISAQUAD) CAPS capsule Take 1 capsule by mouth daily.      [name] ALPRAZolam (XANAX) 0.5 MG tablet Take 1 tablet (0.5 mg total) by mouth 3 (three) times daily as needed for anxiety.  60 tablet  3  . ARIPiprazole (ABILIFY) 10 MG tablet Take 5 mg by mouth every morning.       . ARIPiprazole (ABILIFY) 2 MG tablet Take 1 tablet (2 mg total)  by mouth daily.  90 tablet  3  . brimonidine-timolol (COMBIGAN) 0.2-0.5 % ophthalmic solution Place 1 drop into the right eye every 12 (twelve) hours.       . Cholecalciferol (VITAMIN D-3) 5000 UNITS TABS Take 1 tablet by mouth daily.      [name] dexamethasone (DECADRON) 4 MG tablet Take 2 tablets by mouth once a day on the day after chemotherapy and then take 2 tablets two times a day for 2 days. Take with food.  30 tablet  1  . dorzolamide (TRUSOPT) 2 % ophthalmic solution Place 1 drop into the right eye 2 (two) times daily.       . Flaxseed, Linseed, (FLAX SEED OIL PO) Take 1 capsule by mouth daily.      [name] HYDROcodone-acetaminophen (NORCO) 5-325 MG per tablet Take 1-2 tablets by mouth every 6 (six) hours as needed.  30 tablet  0  . lidocaine-prilocaine (EMLA) cream Apply 1 application topically as needed.  30 g  0  . LORazepam (ATIVAN) 0.5 MG tablet Take 1 tablet (0.5 mg total) by mouth every 6 (six) hours as needed (Nausea or vomiting).  30 tablet  0  . LORazepam (ATIVAN) 1 MG tablet Take 1 tablet (1 mg total) by mouth 2 (two) times daily as needed for anxiety.  60 tablet  0  . MELATONIN PO Take 1 tablet by mouth daily.       . Multiple Vitamin (MULTIVITAMIN WITH MINERALS) TABS Take 1 tablet by mouth daily.      . Omega-3 Fatty Acids (OMEGA 3 PO) Take 1 capsule by mouth daily.      . ondansetron (ZOFRAN) 8 MG tablet Take 1 tablet (8 mg total) by mouth 2 (two) times daily as needed. Start on the third day after chemotherapy.  30 tablet  1  . OVER THE COUNTER MEDICATION Take 1 capsule by mouth daily.      . prochlorperazine (COMPAZINE) 10 MG tablet Take 1 tablet (10 mg total) by mouth every 6 (six) hours as needed (Nausea or vomiting).  30 tablet  1  . thyroid (ARMOUR THYROID) 180 MG tablet Take 1 tablet (180 mg total) by mouth daily before breakfast.  90 tablet  2  . Travoprost, BAK Free, (TRAVATAN) 0.004 % SOLN ophthalmic solution Place 1 drop into the right eye at bedtime.       . WHEY PROTEIN PO  Take 1 scoop by mouth daily.       No current facility-administered medications for this visit.    PHYSICAL EXAMINATION: ECOG PERFORMANCE STATUS: 1 - Symptomatic but completely ambulatory  Filed Vitals:   07/25/14 1529  BP: 126/66  Pulse: 62  Temp: 98.1 F (36.7 C)  Resp: 18   Filed Weights   07/25/14 1529  Weight: 195 lb 11.2 oz (88.769 kg)    GENERAL:alert, no distress and comfortable SKIN: skin color, texture, turgor are normal, no rashes or significant lesions EYES: normal, Conjunctiva are pink and non-injected, sclera clear OROPHARYNX:no exudate, no erythema and lips, buccal mucosa, and tongue normal  NECK: supple, thyroid normal size, non-tender, without nodularity LYMPH:  no palpable lymphadenopathy in the cervical, axillary or inguinal LUNGS: clear to auscultation and percussion with normal breathing effort HEART: regular rate & rhythm and no murmurs and  no lower extremity edema ABDOMEN:abdomen soft, non-tender and normal bowel sounds Musculoskeletal:no cyanosis of digits and no clubbing  NEURO: alert & oriented x 3 with fluent speech, no focal motor/sensory deficits  LABORATORY DATA:  I have reviewed the data as listed   Chemistry      Component Value Date/Time   NA 141 07/19/2014 0959   K 4.1 07/19/2014 0959   CL 105 07/19/2014 0959   CO2 25 07/19/2014 0959   BUN 20 07/19/2014 0959   CREATININE 0.61 07/19/2014 0959   CREATININE 0.73 03/05/2014 1044      Component Value Date/Time   CALCIUM 9.9 07/19/2014 0959   ALKPHOS 53 06/06/2014 1229   AST 14 06/06/2014 1229   ALT 12 06/06/2014 1229   BILITOT 0.3 06/06/2014 1229       Lab Results  Component Value Date   WBC 5.3 07/19/2014   HGB 11.3* 07/19/2014   HCT 33.5* 07/19/2014   MCV 86.8 07/19/2014   PLT 341 07/19/2014   NEUTROABS 2.8 06/06/2014     RADIOGRAPHIC STUDIES: I have personally reviewed the radiology reports and agreed with their findings. No results found.   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 I discussed with her the pathology report and a PET/CT scan. I also discussed the multidisciplinary tumor board discussion regarding her. The consensus was to treat her with definitive therapy and to consider radiation to the L1 vertebra of her entire treatment is complete. She has been counseled extensively that in general stage IV breast cancer cannot be cured but in her situation because of isolated metastases, yet approaching aggressively with neoadjuvant chemotherapy with curative intent. If she has an excellent response to chemotherapy she may be a candidate for lumpectomy followed by radiation therapy not only to the breast and axilla but also to the L1 vertebra.  Patient had gone through chemotherapy class twice and all of her questions have been answered she signed the consent form and is ready to start treatment tomorrow  Major depression: I will continue Abilify prescription I instructed her that she needs to find a new psychiatrist.    Orders Placed This Encounter  Procedures  . PHYSICIAN COMMUNICATION ORDER    A baseline Echo/ Muga should be obtained prior to initiation of Anthracycline Chemotherapy   The patient has a good understanding of the overall plan. she agrees with it. She will call with any problems that may develop before her next visit here.  I spent 25 minutes counseling the patient face to face. The total time spent in the appointment was 30 minutes and more than 50% was on counseling and review of test results    Rulon Eisenmenger, MD 07/25/2014 4:44 PM  And

## 2014-07-25 NOTE — Assessment & Plan Note (Signed)
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 I discussed with her the pathology report and a PET/CT scan. I also discussed the multidisciplinary tumor board discussion regarding her. The consensus was to treat her with definitive therapy and to consider radiation to the L1 vertebra of her entire treatment is complete. She has been counseled extensively that in general stage IV breast cancer cannot be cured but in her situation because of isolated metastases, yet approaching aggressively with neoadjuvant chemotherapy with curative intent. If she has an excellent response to chemotherapy she may be a candidate for lumpectomy followed by radiation therapy not only to the breast and axilla but also to the L1 vertebra.  Patient had gone through chemotherapy class twice and all of her questions have been answered she signed the consent form and is ready to start treatment tomorrow  Major depression: I will continue Abilify prescription I instructed her that she needs to find a new psychiatrist.

## 2014-07-26 ENCOUNTER — Ambulatory Visit (HOSPITAL_BASED_OUTPATIENT_CLINIC_OR_DEPARTMENT_OTHER): Payer: Managed Care, Other (non HMO)

## 2014-07-26 ENCOUNTER — Telehealth: Payer: Self-pay | Admitting: Hematology and Oncology

## 2014-07-26 ENCOUNTER — Other Ambulatory Visit: Payer: Self-pay | Admitting: Hematology and Oncology

## 2014-07-26 ENCOUNTER — Telehealth: Payer: Self-pay | Admitting: *Deleted

## 2014-07-26 ENCOUNTER — Other Ambulatory Visit: Payer: Managed Care, Other (non HMO)

## 2014-07-26 VITALS — BP 99/48 | HR 53 | Resp 18

## 2014-07-26 DIAGNOSIS — C50412 Malignant neoplasm of upper-outer quadrant of left female breast: Secondary | ICD-10-CM

## 2014-07-26 DIAGNOSIS — Z5111 Encounter for antineoplastic chemotherapy: Secondary | ICD-10-CM

## 2014-07-26 LAB — COMPREHENSIVE METABOLIC PANEL (CC13)
ALT: 12 U/L (ref 0–55)
ANION GAP: 5 meq/L (ref 3–11)
AST: 14 U/L (ref 5–34)
Albumin: 3.5 g/dL (ref 3.5–5.0)
Alkaline Phosphatase: 61 U/L (ref 40–150)
BILIRUBIN TOTAL: 0.28 mg/dL (ref 0.20–1.20)
BUN: 17.4 mg/dL (ref 7.0–26.0)
CALCIUM: 9.7 mg/dL (ref 8.4–10.4)
CHLORIDE: 106 meq/L (ref 98–109)
CO2: 27 meq/L (ref 22–29)
Creatinine: 0.7 mg/dL (ref 0.6–1.1)
GLUCOSE: 97 mg/dL (ref 70–140)
Potassium: 4.1 mEq/L (ref 3.5–5.1)
SODIUM: 138 meq/L (ref 136–145)
Total Protein: 6.9 g/dL (ref 6.4–8.3)

## 2014-07-26 LAB — CBC WITH DIFFERENTIAL/PLATELET
BASO%: 0.2 % (ref 0.0–2.0)
Basophils Absolute: 0 10*3/uL (ref 0.0–0.1)
EOS ABS: 0.3 10*3/uL (ref 0.0–0.5)
EOS%: 5.9 % (ref 0.0–7.0)
HCT: 33.6 % — ABNORMAL LOW (ref 34.8–46.6)
HGB: 10.8 g/dL — ABNORMAL LOW (ref 11.6–15.9)
LYMPH#: 1.9 10*3/uL (ref 0.9–3.3)
LYMPH%: 36.9 % (ref 14.0–49.7)
MCH: 29 pg (ref 25.1–34.0)
MCHC: 32.1 g/dL (ref 31.5–36.0)
MCV: 90.1 fL (ref 79.5–101.0)
MONO#: 0.6 10*3/uL (ref 0.1–0.9)
MONO%: 10.8 % (ref 0.0–14.0)
NEUT#: 2.3 10*3/uL (ref 1.5–6.5)
NEUT%: 46.2 % (ref 38.4–76.8)
Platelets: 303 10*3/uL (ref 145–400)
RBC: 3.73 10*6/uL (ref 3.70–5.45)
RDW: 13.5 % (ref 11.2–14.5)
WBC: 5.1 10*3/uL (ref 3.9–10.3)

## 2014-07-26 LAB — IRON AND TIBC CHCC
%SAT: 21 % (ref 21–57)
IRON: 61 ug/dL (ref 41–142)
TIBC: 295 ug/dL (ref 236–444)
UIBC: 234 ug/dL (ref 120–384)

## 2014-07-26 MED ORDER — SODIUM CHLORIDE 0.9 % IV SOLN
Freq: Once | INTRAVENOUS | Status: AC
  Administered 2014-07-26: 11:00:00 via INTRAVENOUS

## 2014-07-26 MED ORDER — DEXAMETHASONE SODIUM PHOSPHATE 20 MG/5ML IJ SOLN
INTRAMUSCULAR | Status: AC
  Filled 2014-07-26: qty 5

## 2014-07-26 MED ORDER — SODIUM CHLORIDE 0.9 % IV SOLN
150.0000 mg | Freq: Once | INTRAVENOUS | Status: AC
  Administered 2014-07-26: 150 mg via INTRAVENOUS
  Filled 2014-07-26: qty 5

## 2014-07-26 MED ORDER — DOXORUBICIN HCL CHEMO IV INJECTION 2 MG/ML
60.0000 mg/m2 | Freq: Once | INTRAVENOUS | Status: AC
  Administered 2014-07-26: 122 mg via INTRAVENOUS
  Filled 2014-07-26: qty 61

## 2014-07-26 MED ORDER — DEXAMETHASONE SODIUM PHOSPHATE 20 MG/5ML IJ SOLN
12.0000 mg | Freq: Once | INTRAMUSCULAR | Status: AC
  Administered 2014-07-26: 12 mg via INTRAVENOUS

## 2014-07-26 MED ORDER — SODIUM CHLORIDE 0.9 % IV SOLN
600.0000 mg/m2 | Freq: Once | INTRAVENOUS | Status: AC
  Administered 2014-07-26: 1220 mg via INTRAVENOUS
  Filled 2014-07-26: qty 61

## 2014-07-26 MED ORDER — PALONOSETRON HCL INJECTION 0.25 MG/5ML
INTRAVENOUS | Status: AC
  Filled 2014-07-26: qty 5

## 2014-07-26 MED ORDER — PALONOSETRON HCL INJECTION 0.25 MG/5ML
0.2500 mg | Freq: Once | INTRAVENOUS | Status: AC
Start: 2014-07-26 — End: 2014-07-26
  Administered 2014-07-26: 0.25 mg via INTRAVENOUS

## 2014-07-26 MED ORDER — SODIUM CHLORIDE 0.9 % IJ SOLN
10.0000 mL | INTRAMUSCULAR | Status: DC | PRN
  Administered 2014-07-26: 10 mL
  Filled 2014-07-26: qty 10

## 2014-07-26 MED ORDER — HEPARIN SOD (PORK) LOCK FLUSH 100 UNIT/ML IV SOLN
500.0000 [IU] | Freq: Once | INTRAVENOUS | Status: AC | PRN
  Administered 2014-07-26: 500 [IU]
  Filled 2014-07-26: qty 5

## 2014-07-26 NOTE — Patient Instructions (Addendum)
[facility] Discharge Instructions for Patients Receiving Chemotherapy  Today you received the following chemotherapy agents Adriamycin/Cytoxan.   To help prevent nausea and vomiting after your treatment, we encourage you to take your nausea medication as directed.    If you develop nausea and vomiting that is not controlled by your nausea medication, call the clinic.   BELOW ARE SYMPTOMS THAT SHOULD BE REPORTED IMMEDIATELY:  *FEVER GREATER THAN 100.5 F  *CHILLS WITH OR WITHOUT FEVER  NAUSEA AND VOMITING THAT IS NOT CONTROLLED WITH YOUR NAUSEA MEDICATION  *UNUSUAL SHORTNESS OF BREATH  *UNUSUAL BRUISING OR BLEEDING  TENDERNESS IN MOUTH AND THROAT WITH OR WITHOUT PRESENCE OF ULCERS  *URINARY PROBLEMS  *BOWEL PROBLEMS  UNUSUAL RASH Items with * indicate a potential emergency and should be followed up as soon as possible.  Feel free to call the clinic you have any questions or concerns. The clinic phone number is [phone].  Doxorubicin injection What is this medicine? DOXORUBICIN (dox oh ROO bi sin) is a chemotherapy drug. It is used to treat many kinds of cancer like Hodgkin's disease, leukemia, non-Hodgkin's lymphoma, neuroblastoma, sarcoma, and Wilms' tumor. It is also used to treat bladder cancer, breast cancer, lung cancer, ovarian cancer, stomach cancer, and thyroid cancer. This medicine may be used for other purposes; ask your health care provider or pharmacist if you have questions. COMMON BRAND NAME(S): Adriamycin, Adriamycin PFS, Adriamycin RDF, Rubex What should I tell my health care provider before I take this medicine? They need to know if you have any of these conditions: -blood disorders -heart disease, recent heart attack -infection (especially a virus infection such as chickenpox, cold sores, or herpes) -irregular heartbeat -liver disease -recent or ongoing radiation therapy -an unusual or allergic reaction to doxorubicin, other  chemotherapy agents, other medicines, foods, dyes, or preservatives -pregnant or trying to get pregnant -breast-feeding How should I use this medicine? This drug is given as an infusion into a vein. It is administered in a hospital or clinic by a specially trained health care professional. If you have pain, swelling, burning or any unusual feeling around the site of your injection, tell your health care professional right away. Talk to your pediatrician regarding the use of this medicine in children. Special care may be needed. Overdosage: If you think you have taken too much of this medicine contact a poison control center or emergency room at once. NOTE: This medicine is only for you. Do not share this medicine with others. What if I miss a dose? It is important not to miss your dose. Call your doctor or health care professional if you are unable to keep an appointment. What may interact with this medicine? Do not take this medicine with any of the following medications: -cisapride -droperidol -halofantrine -pimozide -zidovudine This medicine may also interact with the following medications: -chloroquine -chlorpromazine -clarithromycin -cyclophosphamide -cyclosporine -erythromycin -medicines for depression, anxiety, or psychotic disturbances -medicines for irregular heart beat like amiodarone, bepridil, dofetilide, encainide, flecainide, propafenone, quinidine -medicines for seizures like ethotoin, fosphenytoin, phenytoin -medicines for nausea, vomiting like dolasetron, ondansetron, palonosetron -medicines to increase blood counts like filgrastim, pegfilgrastim, sargramostim -methadone -methotrexate -pentamidine -progesterone -vaccines -verapamil Talk to your doctor or health care professional before taking any of these medicines: -acetaminophen -aspirin -ibuprofen -ketoprofen -naproxen This list may not describe all possible interactions. Give your health care provider a  list of all the medicines, herbs, non-prescription drugs, or dietary supplements you use. Also tell them if you smoke, drink alcohol, or  use illegal drugs. Some items may interact with your medicine. What should I watch for while using this medicine? Your condition will be monitored carefully while you are receiving this medicine. You will need important blood work done while you are taking this medicine. This drug may make you feel generally unwell. This is not uncommon, as chemotherapy can affect healthy cells as well as cancer cells. Report any side effects. Continue your course of treatment even though you feel ill unless your doctor tells you to stop. Your urine may turn red for a few days after your dose. This is not blood. If your urine is dark or brown, call your doctor. In some cases, you may be given additional medicines to help with side effects. Follow all directions for their use. Call your doctor or health care professional for advice if you get a fever, chills or sore throat, or other symptoms of a cold or flu. Do not treat yourself. This drug decreases your body's ability to fight infections. Try to avoid being around people who are sick. This medicine may increase your risk to bruise or bleed. Call your doctor or health care professional if you notice any unusual bleeding. Be careful brushing and flossing your teeth or using a toothpick because you may get an infection or bleed more easily. If you have any dental work done, tell your dentist you are receiving this medicine. Avoid taking products that contain aspirin, acetaminophen, ibuprofen, naproxen, or ketoprofen unless instructed by your doctor. These medicines may hide a fever. Men and women of childbearing age should use effective birth control methods while using taking this medicine. Do not become pregnant while taking this medicine. There is a potential for serious side effects to an unborn child. Talk to your health care  professional or pharmacist for more information. Do not breast-feed an infant while taking this medicine. Do not let others touch your urine or other body fluids for 5 days after each treatment with this medicine. Caregivers should wear latex gloves to avoid touching body fluids during this time. There is a maximum amount of this medicine you should receive throughout your life. The amount depends on the medical condition being treated and your overall health. Your doctor will watch how much of this medicine you receive in your lifetime. Tell your doctor if you have taken this medicine before. What side effects may I notice from receiving this medicine? Side effects that you should report to your doctor or health care professional as soon as possible: -allergic reactions like skin rash, itching or hives, swelling of the face, lips, or tongue -low blood counts - this medicine may decrease the number of white blood cells, red blood cells and platelets. You may be at increased risk for infections and bleeding. -signs of infection - fever or chills, cough, sore throat, pain or difficulty passing urine -signs of decreased platelets or bleeding - bruising, pinpoint red spots on the skin, black, tarry stools, blood in the urine -signs of decreased red blood cells - unusually weak or tired, fainting spells, lightheadedness -breathing problems -chest pain -fast, irregular heartbeat -mouth sores -nausea, vomiting -pain, swelling, redness at site where injected -pain, tingling, numbness in the hands or feet -swelling of ankles, feet, or hands -unusual bleeding or bruising Side effects that usually do not require medical attention (report to your doctor or health care professional if they continue or are bothersome): -diarrhea -facial flushing -hair loss -loss of appetite -missed menstrual periods -nail discoloration or damage -  red or watery eyes -red colored urine -stomach upset This list may not  describe all possible side effects. Call your doctor for medical advice about side effects. You may report side effects to FDA at 1-800-FDA-1088. Where should I keep my medicine? This drug is given in a hospital or clinic and will not be stored at home. NOTE: This sheet is a summary. It may not cover all possible information. If you have questions about this medicine, talk to your doctor, pharmacist, or health care provider.  2015, [employer]. (2013-02-07 09:54:34)  Cyclophosphamide injection What is this medicine? CYCLOPHOSPHAMIDE (sye kloe FOSS fa mide) is a chemotherapy drug. It slows the growth of cancer cells. This medicine is used to treat many types of cancer like lymphoma, myeloma, leukemia, breast cancer, and ovarian cancer, to name a few. This medicine may be used for other purposes; ask your health care provider or pharmacist if you have questions. COMMON BRAND NAME(S): Cytoxan, Neosar What should I tell my health care provider before I take this medicine? They need to know if you have any of these conditions: -blood disorders -history of other chemotherapy -infection -kidney disease -liver disease -recent or ongoing radiation therapy -tumors in the bone marrow -an unusual or allergic reaction to cyclophosphamide, other chemotherapy, other medicines, foods, dyes, or preservatives -pregnant or trying to get pregnant -breast-feeding How should I use this medicine? This drug is usually given as an injection into a vein or muscle or by infusion into a vein. It is administered in a hospital or clinic by a specially trained health care professional. Talk to your pediatrician regarding the use of this medicine in children. Special care may be needed. Overdosage: If you think you have taken too much of this medicine contact a poison control center or emergency room at once. NOTE: This medicine is only for you. Do not share this medicine with others. What if I miss a  dose? It is important not to miss your dose. Call your doctor or health care professional if you are unable to keep an appointment. What may interact with this medicine? This medicine may interact with the following medications: -amiodarone -amphotericin B -azathioprine -certain antiviral medicines for HIV or AIDS such as protease inhibitors (e.g., indinavir, ritonavir) and zidovudine -certain blood pressure medications such as benazepril, captopril, enalapril, fosinopril, lisinopril, moexipril, monopril, perindopril, quinapril, ramipril, trandolapril -certain cancer medications such as anthracyclines (e.g., daunorubicin, doxorubicin), busulfan, cytarabine, paclitaxel, pentostatin, tamoxifen, trastuzumab -certain diuretics such as chlorothiazide, chlorthalidone, hydrochlorothiazide, indapamide, metolazone -certain medicines that treat or prevent blood clots like warfarin -certain muscle relaxants such as succinylcholine -cyclosporine -etanercept -indomethacin -medicines to increase blood counts like filgrastim, pegfilgrastim, sargramostim -medicines used as general anesthesia -metronidazole -natalizumab This list may not describe all possible interactions. Give your health care provider a list of all the medicines, herbs, non-prescription drugs, or dietary supplements you use. Also tell them if you smoke, drink alcohol, or use illegal drugs. Some items may interact with your medicine. What should I watch for while using this medicine? Visit your doctor for checks on your progress. This drug may make you feel generally unwell. This is not uncommon, as chemotherapy can affect healthy cells as well as cancer cells. Report any side effects. Continue your course of treatment even though you feel ill unless your doctor tells you to stop. Drink water or other fluids as directed. Urinate often, even at night. In some cases, you may be given additional medicines to help with side effects. Follow all  directions for their use. Call your doctor or health care professional for advice if you get a fever, chills or sore throat, or other symptoms of a cold or flu. Do not treat yourself. This drug decreases your body's ability to fight infections. Try to avoid being around people who are sick. This medicine may increase your risk to bruise or bleed. Call your doctor or health care professional if you notice any unusual bleeding. Be careful brushing and flossing your teeth or using a toothpick because you may get an infection or bleed more easily. If you have any dental work done, tell your dentist you are receiving this medicine. You may get drowsy or dizzy. Do not drive, use machinery, or do anything that needs mental alertness until you know how this medicine affects you. Do not become pregnant while taking this medicine or for 1 year after stopping it. Women should inform their doctor if they wish to become pregnant or think they might be pregnant. Men should not father a child while taking this medicine and for 4 months after stopping it. There is a potential for serious side effects to an unborn child. Talk to your health care professional or pharmacist for more information. Do not breast-feed an infant while taking this medicine. This medicine may interfere with the ability to have a child. This medicine has caused ovarian failure in some women. This medicine has caused reduced sperm counts in some men. You should talk with your doctor or health care professional if you are concerned about your fertility. If you are going to have surgery, tell your doctor or health care professional that you have taken this medicine. What side effects may I notice from receiving this medicine? Side effects that you should report to your doctor or health care professional as soon as possible: -allergic reactions like skin rash, itching or hives, swelling of the face, lips, or tongue -low blood counts - this medicine may  decrease the number of white blood cells, red blood cells and platelets. You may be at increased risk for infections and bleeding. -signs of infection - fever or chills, cough, sore throat, pain or difficulty passing urine -signs of decreased platelets or bleeding - bruising, pinpoint red spots on the skin, black, tarry stools, blood in the urine -signs of decreased red blood cells - unusually weak or tired, fainting spells, lightheadedness -breathing problems -dark urine -dizziness -palpitations -swelling of the ankles, feet, hands -trouble passing urine or change in the amount of urine -weight gain -yellowing of the eyes or skin Side effects that usually do not require medical attention (report to your doctor or health care professional if they continue or are bothersome): -changes in nail or skin color -hair loss -missed menstrual periods -mouth sores -nausea, vomiting This list may not describe all possible side effects. Call your doctor for medical advice about side effects. You may report side effects to FDA at 1-800-FDA-1088. Where should I keep my medicine? This drug is given in a hospital or clinic and will not be stored at home. NOTE: This sheet is a summary. It may not cover all possible information. If you have questions about this medicine, talk to your doctor, pharmacist, or health care provider.  2015, [employer]. (2012-08-26 16:22:58)   Pegfilgrastim injection What is this medicine? PEGFILGRASTIM (peg fil GRA stim) is a long-acting granulocyte colony-stimulating factor that stimulates the growth of neutrophils, a type of white blood cell important in the body's fight against infection. It is used  to reduce the incidence of fever and infection in patients with certain types of cancer who are receiving chemotherapy that affects the bone marrow. This medicine may be used for other purposes; ask your health care provider or pharmacist if you have  questions. COMMON BRAND NAME(S): Neulasta What should I tell my health care provider before I take this medicine? They need to know if you have any of these conditions: -latex allergy -ongoing radiation therapy -sickle cell disease -skin reactions to acrylic adhesives (On-Body Injector only) -an unusual or allergic reaction to pegfilgrastim, filgrastim, other medicines, foods, dyes, or preservatives -pregnant or trying to get pregnant -breast-feeding How should I use this medicine? This medicine is for injection under the skin. If you get this medicine at home, you will be taught how to prepare and give the pre-filled syringe or how to use the On-body Injector. Refer to the patient Instructions for Use for detailed instructions. Use exactly as directed. Take your medicine at regular intervals. Do not take your medicine more often than directed. It is important that you put your used needles and syringes in a special sharps container. Do not put them in a trash can. If you do not have a sharps container, call your pharmacist or healthcare provider to get one. Talk to your pediatrician regarding the use of this medicine in children. Special care may be needed. Overdosage: If you think you have taken too much of this medicine contact a poison control center or emergency room at once. NOTE: This medicine is only for you. Do not share this medicine with others. What if I miss a dose? It is important not to miss your dose. Call your doctor or health care professional if you miss your dose. If you miss a dose due to an On-body Injector failure or leakage, a new dose should be administered as soon as possible using a single prefilled syringe for manual use. What may interact with this medicine? Interactions have not been studied. Give your health care provider a list of all the medicines, herbs, non-prescription drugs, or dietary supplements you use. Also tell them if you smoke, drink alcohol, or use  illegal drugs. Some items may interact with your medicine. This list may not describe all possible interactions. Give your health care provider a list of all the medicines, herbs, non-prescription drugs, or dietary supplements you use. Also tell them if you smoke, drink alcohol, or use illegal drugs. Some items may interact with your medicine. What should I watch for while using this medicine? You may need blood work done while you are taking this medicine. If you are going to need a MRI, CT scan, or other procedure, tell your doctor that you are using this medicine (On-Body Injector only). What side effects may I notice from receiving this medicine? Side effects that you should report to your doctor or health care professional as soon as possible: -allergic reactions like skin rash, itching or hives, swelling of the face, lips, or tongue -dizziness -fever -pain, redness, or irritation at site where injected -pinpoint red spots on the skin -shortness of breath or breathing problems -stomach or side pain, or pain at the shoulder -swelling -tiredness -trouble passing urine Side effects that usually do not require medical attention (report to your doctor or health care professional if they continue or are bothersome): -bone pain -muscle pain This list may not describe all possible side effects. Call your doctor for medical advice about side effects. You may report side effects to  FDA at 1-800-FDA-1088. Where should I keep my medicine? Keep out of the reach of children. Store pre-filled syringes in a refrigerator between 2 and 8 degrees C (36 and 46 degrees F). Do not freeze. Keep in carton to protect from light. Throw away this medicine if it is left out of the refrigerator for more than 48 hours. Throw away any unused medicine after the expiration date. NOTE: This sheet is a summary. It may not cover all possible information. If you have questions about this medicine, talk to your doctor,  pharmacist, or health care provider.  2015, [employer]. (2014-01-11 16:14:05)

## 2014-07-26 NOTE — Telephone Encounter (Signed)
Per staff message and POF I have scheduled appts. Advised scheduler of appts. JMW  

## 2014-07-26 NOTE — Telephone Encounter (Signed)
, °

## 2014-07-27 ENCOUNTER — Other Ambulatory Visit: Payer: Self-pay

## 2014-07-27 ENCOUNTER — Ambulatory Visit (HOSPITAL_BASED_OUTPATIENT_CLINIC_OR_DEPARTMENT_OTHER): Payer: Managed Care, Other (non HMO)

## 2014-07-27 ENCOUNTER — Encounter: Payer: Self-pay | Admitting: Hematology and Oncology

## 2014-07-27 ENCOUNTER — Telehealth: Payer: Self-pay | Admitting: *Deleted

## 2014-07-27 VITALS — BP 127/59 | HR 65 | Temp 97.8°F

## 2014-07-27 DIAGNOSIS — C50412 Malignant neoplasm of upper-outer quadrant of left female breast: Secondary | ICD-10-CM

## 2014-07-27 DIAGNOSIS — Z08 Encounter for follow-up examination after completed treatment for malignant neoplasm: Secondary | ICD-10-CM

## 2014-07-27 DIAGNOSIS — C50411 Malignant neoplasm of upper-outer quadrant of right female breast: Secondary | ICD-10-CM

## 2014-07-27 LAB — FOLATE RBC

## 2014-07-27 LAB — VITAMIN B12: Vitamin B-12: 894 pg/mL (ref 211–911)

## 2014-07-27 MED ORDER — PEGFILGRASTIM INJECTION 6 MG/0.6ML
6.0000 mg | Freq: Once | SUBCUTANEOUS | Status: AC
  Administered 2014-07-27: 6 mg via SUBCUTANEOUS
  Filled 2014-07-27: qty 0.6

## 2014-07-27 NOTE — Progress Notes (Signed)
Faxed clinical information to [facility] @ [phone], for patient's disability

## 2014-07-27 NOTE — Patient Instructions (Signed)
Pegfilgrastim injection What is this medicine? PEGFILGRASTIM (peg fil GRA stim) is a long-acting granulocyte colony-stimulating factor that stimulates the growth of neutrophils, a type of white blood cell important in the body's fight against infection. It is used to reduce the incidence of fever and infection in patients with certain types of cancer who are receiving chemotherapy that affects the bone marrow. This medicine may be used for other purposes; ask your health care provider or pharmacist if you have questions. COMMON BRAND NAME(S): Neulasta What should I tell my health care provider before I take this medicine? They need to know if you have any of these conditions: -latex allergy -ongoing radiation therapy -sickle cell disease -skin reactions to acrylic adhesives (On-Body Injector only) -an unusual or allergic reaction to pegfilgrastim, filgrastim, other medicines, foods, dyes, or preservatives -pregnant or trying to get pregnant -breast-feeding How should I use this medicine? This medicine is for injection under the skin. If you get this medicine at home, you will be taught how to prepare and give the pre-filled syringe or how to use the On-body Injector. Refer to the patient Instructions for Use for detailed instructions. Use exactly as directed. Take your medicine at regular intervals. Do not take your medicine more often than directed. It is important that you put your used needles and syringes in a special sharps container. Do not put them in a trash can. If you do not have a sharps container, call your pharmacist or healthcare provider to get one. Talk to your pediatrician regarding the use of this medicine in children. Special care may be needed. Overdosage: If you think you have taken too much of this medicine contact a poison control center or emergency room at once. NOTE: This medicine is only for you. Do not share this medicine with others. What if I miss a dose? It is  important not to miss your dose. Call your doctor or health care professional if you miss your dose. If you miss a dose due to an On-body Injector failure or leakage, a new dose should be administered as soon as possible using a single prefilled syringe for manual use. What may interact with this medicine? Interactions have not been studied. Give your health care provider a list of all the medicines, herbs, non-prescription drugs, or dietary supplements you use. Also tell them if you smoke, drink alcohol, or use illegal drugs. Some items may interact with your medicine. This list may not describe all possible interactions. Give your health care provider a list of all the medicines, herbs, non-prescription drugs, or dietary supplements you use. Also tell them if you smoke, drink alcohol, or use illegal drugs. Some items may interact with your medicine. What should I watch for while using this medicine? You may need blood work done while you are taking this medicine. If you are going to need a MRI, CT scan, or other procedure, tell your doctor that you are using this medicine (On-Body Injector only). What side effects may I notice from receiving this medicine? Side effects that you should report to your doctor or health care professional as soon as possible: -allergic reactions like skin rash, itching or hives, swelling of the face, lips, or tongue -dizziness -fever -pain, redness, or irritation at site where injected -pinpoint red spots on the skin -shortness of breath or breathing problems -stomach or side pain, or pain at the shoulder -swelling -tiredness -trouble passing urine Side effects that usually do not require medical attention (report to your doctor   or health care professional if they continue or are bothersome): -bone pain -muscle pain This list may not describe all possible side effects. Call your doctor for medical advice about side effects. You may report side effects to FDA at  1-800-FDA-1088. Where should I keep my medicine? Keep out of the reach of children. Store pre-filled syringes in a refrigerator between 2 and 8 degrees C (36 and 46 degrees F). Do not freeze. Keep in carton to protect from light. Throw away this medicine if it is left out of the refrigerator for more than 48 hours. Throw away any unused medicine after the expiration date. NOTE: This sheet is a summary. It may not cover all possible information. If you have questions about this medicine, talk to your doctor, pharmacist, or health care provider.  2015, [employer]. (2014-01-11 16:14:05)  

## 2014-07-27 NOTE — Telephone Encounter (Signed)
Kimberly Reed here for Neulasta injection following 1st AC chemo treatment.  States that she is doing fair.  Had some nausea last evening which was relieved with her antiemetic.  No vomiting or diarrhea.  Is drinking some fluids but needs to increase those.  Is eating well today.  All questions answered.  Knows to call if she has any problems or concerns.

## 2014-07-27 NOTE — Progress Notes (Signed)
Put [employer] disability form on nurse's desk. °

## 2014-07-27 NOTE — Progress Notes (Signed)
Per [facility] lab - sample bad.  Order re-entered to be redrawn on 10/8.

## 2014-08-01 ENCOUNTER — Encounter: Payer: Self-pay | Admitting: Hematology and Oncology

## 2014-08-01 NOTE — Progress Notes (Signed)
Faxed disability form to [employer] @ [phone] °

## 2014-08-02 ENCOUNTER — Other Ambulatory Visit (HOSPITAL_BASED_OUTPATIENT_CLINIC_OR_DEPARTMENT_OTHER): Payer: Managed Care, Other (non HMO)

## 2014-08-02 ENCOUNTER — Other Ambulatory Visit: Payer: Self-pay | Admitting: *Deleted

## 2014-08-02 ENCOUNTER — Ambulatory Visit (HOSPITAL_BASED_OUTPATIENT_CLINIC_OR_DEPARTMENT_OTHER): Payer: Managed Care, Other (non HMO) | Admitting: Hematology and Oncology

## 2014-08-02 VITALS — BP 103/57 | HR 59 | Temp 97.7°F | Resp 18 | Ht 65.0 in | Wt 195.2 lb

## 2014-08-02 DIAGNOSIS — T451X5A Adverse effect of antineoplastic and immunosuppressive drugs, initial encounter: Secondary | ICD-10-CM

## 2014-08-02 DIAGNOSIS — C50412 Malignant neoplasm of upper-outer quadrant of left female breast: Secondary | ICD-10-CM

## 2014-08-02 DIAGNOSIS — Z170 Estrogen receptor positive status [ER+]: Secondary | ICD-10-CM

## 2014-08-02 DIAGNOSIS — C773 Secondary and unspecified malignant neoplasm of axilla and upper limb lymph nodes: Secondary | ICD-10-CM

## 2014-08-02 DIAGNOSIS — D702 Other drug-induced agranulocytosis: Secondary | ICD-10-CM

## 2014-08-02 DIAGNOSIS — D701 Agranulocytosis secondary to cancer chemotherapy: Secondary | ICD-10-CM

## 2014-08-02 LAB — COMPREHENSIVE METABOLIC PANEL (CC13)
ALT: 14 U/L (ref 0–55)
ANION GAP: 5 meq/L (ref 3–11)
AST: 11 U/L (ref 5–34)
Albumin: 3.2 g/dL — ABNORMAL LOW (ref 3.5–5.0)
Alkaline Phosphatase: 70 U/L (ref 40–150)
BILIRUBIN TOTAL: 0.53 mg/dL (ref 0.20–1.20)
BUN: 11.9 mg/dL (ref 7.0–26.0)
CO2: 29 mEq/L (ref 22–29)
Calcium: 10 mg/dL (ref 8.4–10.4)
Chloride: 103 mEq/L (ref 98–109)
Creatinine: 0.7 mg/dL (ref 0.6–1.1)
GLUCOSE: 101 mg/dL (ref 70–140)
Potassium: 4 mEq/L (ref 3.5–5.1)
Sodium: 137 mEq/L (ref 136–145)
TOTAL PROTEIN: 6.6 g/dL (ref 6.4–8.3)

## 2014-08-02 LAB — CBC WITH DIFFERENTIAL/PLATELET
BASO%: 0.8 % (ref 0.0–2.0)
Basophils Absolute: 0 10*3/uL (ref 0.0–0.1)
EOS ABS: 0.2 10*3/uL (ref 0.0–0.5)
EOS%: 14.1 % — ABNORMAL HIGH (ref 0.0–7.0)
HEMATOCRIT: 33.6 % — AB (ref 34.8–46.6)
HGB: 11.3 g/dL — ABNORMAL LOW (ref 11.6–15.9)
LYMPH%: 71.1 % — AB (ref 14.0–49.7)
MCH: 29.7 pg (ref 25.1–34.0)
MCHC: 33.6 g/dL (ref 31.5–36.0)
MCV: 88.2 fL (ref 79.5–101.0)
MONO#: 0.1 10*3/uL (ref 0.1–0.9)
MONO%: 4.7 % (ref 0.0–14.0)
NEUT%: 9.3 % — AB (ref 38.4–76.8)
NEUTROS ABS: 0.1 10*3/uL — AB (ref 1.5–6.5)
PLATELETS: 239 10*3/uL (ref 145–400)
RBC: 3.81 10*6/uL (ref 3.70–5.45)
RDW: 12.9 % (ref 11.2–14.5)
WBC: 1.3 10*3/uL — AB (ref 3.9–10.3)
lymph#: 0.9 10*3/uL (ref 0.9–3.3)

## 2014-08-02 NOTE — Assessment & Plan Note (Signed)
Chemotherapy induced neutropenia: Patient received Neulasta after chemotherapy. I discussed with her neutropenic precautions. I provided her with a mask. I instructed her to call us if she develops any fevers or chills.

## 2014-08-02 NOTE — Progress Notes (Signed)
Patient Care Team: Rachell Cipro, MD as PCP - General (Family Medicine) [facility]  Rulon Eisenmenger, MD as Consulting Physician (Hematology and Oncology) [name], MD as Consulting Physician (General Surgery) Eppie Gibson, MD as Attending Physician (Radiation Oncology)  DIAGNOSIS: Breast cancer of upper-outer quadrant of left female breast   Primary site: Breast (Left)   Staging method: AJCC 7th Edition   Clinical: Stage IIB (T2, N1, cM0)   Summary: Stage IIB (T2, N1, cM0)   Clinical comments: Staged at breast conference 06/06/14.   SUMMARY OF ONCOLOGIC HISTORY:   Breast cancer of upper-outer quadrant of left female breast   05/18/2014 Mammogram Suspicious left upper outer quadrant 5 cm segmental area of calcifications.    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   05/30/2014 Breast MRI Left Breast: 12oclock: 21 x 23 x 30 mm, Numerous  level 1 and 2 LN; Liver lesions cycts by Liver MRI   06/07/2014 Initial Biopsy Left axillary lymph node biopsy invasive ductal carcinoma ER 100%, PR 11% Ki-67 15% HER-2 negative ratio 1.41   06/13/2014 PET scan and metabolic she were left breast, hypermetabolic left axillary and left retropectoral lymph node, small lucent lesion in L1 vertebra which was biopsied and proven to be bone metastases   07/19/2014 Initial Biopsy Biopsy of L1 vertebra metastatic carcinoma breast primary, ER 100%, PR 4%, HER-2 negative ratio 1.3    CHIEF COMPLIANT: Patient here for nadir count check  INTERVAL HISTORY: Kimberly Reed is a cyst-year-old Caucasian lady with above-mentioned history of left-sided breast cancer with L1 metastatic disease but currently she is being treated with definite of neoadjuvant chemotherapy with Adriamycin Cytoxan. She received cycle one week ago and is here for nadir count check and for toxicity evaluation. It appears that she tolerated chemotherapy  extremely well with very minimal side effects. In fact she felt great for the first 3-4 days after chemotherapy. After the fourth day she developed fogginess in the head and did not feel comfortable leaving the house and she spent most of the time in the house for about 3 days. Today she feels remarkably well. In the interim she had constipation issues for which he took stool softeners which resolved her symptoms. She did not have any fevers or chills or nausea or vomiting.  REVIEW OF SYSTEMS:   Constitutional: Denies fevers, chills or abnormal weight loss Eyes: Denies blurriness of vision Ears, nose, mouth, throat, and face: Denies mucositis or sore throat Respiratory: Denies cough, dyspnea or wheezes Cardiovascular: Denies palpitation, chest discomfort or lower extremity swelling Gastrointestinal:  Denies nausea, heartburn or change in bowel habits Skin: Denies abnormal skin rashes Lymphatics: Denies new lymphadenopathy or easy bruising Neurological:Denies numbness, tingling or new weaknesses Behavioral/Psych: Mood is stable, no new changes  Breast: Left breast lump All other systems were reviewed with the patient and are negative.  I have reviewed the past medical history, past surgical history, social history and family history with the patient and they are unchanged from previous note.  ALLERGIES:  is allergic to other and effexor.  MEDICATIONS:  Current Outpatient Prescriptions  Medication Sig Dispense Refill  . acidophilus (RISAQUAD) CAPS capsule Take 1 capsule by mouth daily.      [name] ALPRAZolam (XANAX) 0.5 MG tablet Take 1 tablet (0.5 mg total) by mouth 3 (three) times daily as needed for anxiety.  60 tablet  3  . ARIPiprazole (ABILIFY) 10 MG tablet Take 5 mg by mouth  every morning.       . ARIPiprazole (ABILIFY) 2 MG tablet Take 1 tablet (2 mg total) by mouth daily.  90 tablet  3  . brimonidine-timolol (COMBIGAN) 0.2-0.5 % ophthalmic solution Place 1 drop into the right eye every  12 (twelve) hours.       . Cholecalciferol (VITAMIN D-3) 5000 UNITS TABS Take 1 tablet by mouth daily.      [name] dexamethasone (DECADRON) 4 MG tablet Take 2 tablets by mouth once a day on the day after chemotherapy and then take 2 tablets two times a day for 2 days. Take with food.  30 tablet  1  . dorzolamide (TRUSOPT) 2 % ophthalmic solution Place 1 drop into the right eye 2 (two) times daily.       . Flaxseed, Linseed, (FLAX SEED OIL PO) Take 1 capsule by mouth daily.      [name] HYDROcodone-acetaminophen (NORCO) 5-325 MG per tablet Take 1-2 tablets by mouth every 6 (six) hours as needed.  30 tablet  0  . lidocaine-prilocaine (EMLA) cream Apply 1 application topically as needed.  30 g  0  . LORazepam (ATIVAN) 0.5 MG tablet Take 1 tablet (0.5 mg total) by mouth every 6 (six) hours as needed (Nausea or vomiting).  30 tablet  0  . LORazepam (ATIVAN) 1 MG tablet Take 1 tablet (1 mg total) by mouth 2 (two) times daily as needed for anxiety.  60 tablet  0  . Omega-3 Fatty Acids (OMEGA 3 PO) Take 1 capsule by mouth daily.      . ondansetron (ZOFRAN) 8 MG tablet Take 1 tablet (8 mg total) by mouth 2 (two) times daily as needed. Start on the third day after chemotherapy.  30 tablet  1  . OVER THE COUNTER MEDICATION Take 1 capsule by mouth daily.      . prochlorperazine (COMPAZINE) 10 MG tablet Take 1 tablet (10 mg total) by mouth every 6 (six) hours as needed (Nausea or vomiting).  30 tablet  1  . thyroid (ARMOUR THYROID) 180 MG tablet Take 1 tablet (180 mg total) by mouth daily before breakfast.  90 tablet  2  . Travoprost, BAK Free, (TRAVATAN) 0.004 % SOLN ophthalmic solution Place 1 drop into the right eye at bedtime.       . WHEY PROTEIN PO Take 1 scoop by mouth daily.       No current facility-administered medications for this visit.    PHYSICAL EXAMINATION: ECOG PERFORMANCE STATUS: 1 - Symptomatic but completely ambulatory  Filed Vitals:   08/02/14 1014  BP: 103/57  Pulse: 59  Temp: 97.7 F (36.5  C)  Resp: 18   Filed Weights   08/02/14 1014  Weight: 195 lb 3.2 oz (88.542 kg)    GENERAL:alert, no distress and comfortable SKIN: skin color, texture, turgor are normal, no rashes or significant lesions EYES: normal, Conjunctiva are pink and non-injected, sclera clear OROPHARYNX:no exudate, no erythema and lips, buccal mucosa, and tongue normal  NECK: supple, thyroid normal size, non-tender, without nodularity LYMPH:  no palpable lymphadenopathy in the cervical, axillary or inguinal LUNGS: clear to auscultation and percussion with normal breathing effort HEART: regular rate & rhythm and no murmurs and no lower extremity edema ABDOMEN:abdomen soft, non-tender and normal bowel sounds Musculoskeletal:no cyanosis of digits and no clubbing  NEURO: alert & oriented x 3 with fluent speech, no focal motor/sensory deficits  LABORATORY DATA:  I have reviewed the data as listed   Chemistry  Component Value Date/Time   NA 137 08/02/2014 0956   NA 141 07/19/2014 0959   K 4.0 08/02/2014 0956   K 4.1 07/19/2014 0959   CL 105 07/19/2014 0959   CO2 29 08/02/2014 0956   CO2 25 07/19/2014 0959   BUN 11.9 08/02/2014 0956   BUN 20 07/19/2014 0959   CREATININE 0.7 08/02/2014 0956   CREATININE 0.61 07/19/2014 0959   CREATININE 0.73 03/05/2014 1044      Component Value Date/Time   CALCIUM 10.0 08/02/2014 0956   CALCIUM 9.9 07/19/2014 0959   ALKPHOS 70 08/02/2014 0956   ALKPHOS 53 06/06/2014 1229   AST 11 08/02/2014 0956   AST 14 06/06/2014 1229   ALT 14 08/02/2014 0956   ALT 12 06/06/2014 1229   BILITOT 0.53 08/02/2014 0956   BILITOT 0.3 06/06/2014 1229       Lab Results  Component Value Date   WBC 1.3* 08/02/2014   HGB 11.3* 08/02/2014   HCT 33.6* 08/02/2014   MCV 88.2 08/02/2014   PLT 239 08/02/2014   NEUTROABS 0.1* 08/02/2014     RADIOGRAPHIC STUDIES: No results found.   ASSESSMENT & PLAN:  Breast cancer of upper-outer quadrant of left female breast Metastatic breast cancer to start in the  left breast involving left axillary lymph nodes and L1 tubules on the treatment stage IV disease  Patient is currently being treated with neoadjuvant chemotherapy with dose dense Adriamycin and Cytoxan today is cycle 1 day 8. She tolerated chemotherapy extremely well other than constipation and fogginess in the head that lasted 2-3 day history which time she did not leave her house. Constipation was resolved by taking stool softeners and prune juice. She did not have any nausea or vomiting. Her appetite and weight are stable.  Patient denies any fevers or chills or any other complications.  Chemotherapy induced neutropenia Chemotherapy induced neutropenia: Patient received Neulasta after chemotherapy. I discussed with her neutropenic precautions. I provided her with a mask. I instructed her to call us if she develops any fevers or chills.   Orders Placed This Encounter  Procedures  . Comprehensive metabolic panel (Cmet) - [facility]    Standing Status: Standing     Number of Occurrences: 20     Standing Expiration Date: 08/03/2015  . CBC with Differential    Standing Status: Standing     Number of Occurrences: 20     Standing Expiration Date: 08/03/2015   The patient has a good understanding of the overall plan. she agrees with it. She will call with any problems that may develop before her next visit here.  I spent 30 minutes counseling the patient face to face. The total time spent in the appointment was 40 minutes and more than 50% was on counseling and review of test results    Rulon Eisenmenger, MD 08/02/2014 10:59 AM

## 2014-08-02 NOTE — Assessment & Plan Note (Signed)
Metastatic breast cancer to start in the left breast involving left axillary lymph nodes and L1 tubules on the treatment stage IV disease  Patient is currently being treated with neoadjuvant chemotherapy with dose dense Adriamycin and Cytoxan today is cycle 1 day 8. She tolerated chemotherapy extremely well other than constipation and fogginess in the head that lasted 2-3 day history which time she did not leave her house. Constipation was resolved by taking stool softeners and prune juice. She did not have any nausea or vomiting. Her appetite and weight are stable.  Patient denies any fevers or chills or any other complications.

## 2014-08-03 ENCOUNTER — Telehealth: Payer: Self-pay | Admitting: Hematology and Oncology

## 2014-08-03 LAB — FOLATE RBC: RBC Folate: 1153 ng/mL (ref >280)

## 2014-08-03 NOTE — Telephone Encounter (Signed)
, °

## 2014-08-06 ENCOUNTER — Telehealth: Payer: Self-pay | Admitting: *Deleted

## 2014-08-06 NOTE — Telephone Encounter (Signed)
Per staff message I have adjusted 10/15 appt

## 2014-08-09 ENCOUNTER — Ambulatory Visit (HOSPITAL_BASED_OUTPATIENT_CLINIC_OR_DEPARTMENT_OTHER): Payer: Managed Care, Other (non HMO) | Admitting: Adult Health

## 2014-08-09 ENCOUNTER — Ambulatory Visit (HOSPITAL_BASED_OUTPATIENT_CLINIC_OR_DEPARTMENT_OTHER): Payer: Managed Care, Other (non HMO)

## 2014-08-09 ENCOUNTER — Telehealth: Payer: Self-pay | Admitting: *Deleted

## 2014-08-09 ENCOUNTER — Encounter: Payer: Self-pay | Admitting: Adult Health

## 2014-08-09 ENCOUNTER — Telehealth: Payer: Self-pay | Admitting: Adult Health

## 2014-08-09 ENCOUNTER — Other Ambulatory Visit: Payer: Managed Care, Other (non HMO)

## 2014-08-09 VITALS — BP 106/59 | HR 61 | Temp 97.4°F | Resp 20 | Ht 65.0 in | Wt 198.2 lb

## 2014-08-09 DIAGNOSIS — C50412 Malignant neoplasm of upper-outer quadrant of left female breast: Secondary | ICD-10-CM

## 2014-08-09 DIAGNOSIS — Z170 Estrogen receptor positive status [ER+]: Secondary | ICD-10-CM

## 2014-08-09 DIAGNOSIS — Z5111 Encounter for antineoplastic chemotherapy: Secondary | ICD-10-CM

## 2014-08-09 DIAGNOSIS — C7951 Secondary malignant neoplasm of bone: Secondary | ICD-10-CM

## 2014-08-09 DIAGNOSIS — D630 Anemia in neoplastic disease: Secondary | ICD-10-CM

## 2014-08-09 LAB — COMPREHENSIVE METABOLIC PANEL (CC13)
ALT: 17 U/L (ref 0–55)
AST: 12 U/L (ref 5–34)
Albumin: 3.3 g/dL — ABNORMAL LOW (ref 3.5–5.0)
Alkaline Phosphatase: 71 U/L (ref 40–150)
Anion Gap: 7 mEq/L (ref 3–11)
BUN: 11.9 mg/dL (ref 7.0–26.0)
CHLORIDE: 107 meq/L (ref 98–109)
CO2: 25 mEq/L (ref 22–29)
Calcium: 9.9 mg/dL (ref 8.4–10.4)
Creatinine: 0.7 mg/dL (ref 0.6–1.1)
Glucose: 100 mg/dl (ref 70–140)
Potassium: 4 mEq/L (ref 3.5–5.1)
Sodium: 139 mEq/L (ref 136–145)
Total Protein: 6.6 g/dL (ref 6.4–8.3)

## 2014-08-09 LAB — CBC WITH DIFFERENTIAL/PLATELET
BASO%: 0.9 % (ref 0.0–2.0)
Basophils Absolute: 0.1 10*3/uL (ref 0.0–0.1)
EOS%: 0.2 % (ref 0.0–7.0)
Eosinophils Absolute: 0 10*3/uL (ref 0.0–0.5)
HEMATOCRIT: 30.5 % — AB (ref 34.8–46.6)
HGB: 10 g/dL — ABNORMAL LOW (ref 11.6–15.9)
LYMPH#: 1.7 10*3/uL (ref 0.9–3.3)
LYMPH%: 19.9 % (ref 14.0–49.7)
MCH: 29.4 pg (ref 25.1–34.0)
MCHC: 32.8 g/dL (ref 31.5–36.0)
MCV: 89.6 fL (ref 79.5–101.0)
MONO#: 0.7 10*3/uL (ref 0.1–0.9)
MONO%: 8 % (ref 0.0–14.0)
NEUT#: 6 10*3/uL (ref 1.5–6.5)
NEUT%: 71 % (ref 38.4–76.8)
Platelets: 249 10*3/uL (ref 145–400)
RBC: 3.4 10*6/uL — AB (ref 3.70–5.45)
RDW: 13 % (ref 11.2–14.5)
WBC: 8.5 10*3/uL (ref 3.9–10.3)

## 2014-08-09 MED ORDER — SODIUM CHLORIDE 0.9 % IV SOLN
600.0000 mg/m2 | Freq: Once | INTRAVENOUS | Status: AC
  Administered 2014-08-09: 1220 mg via INTRAVENOUS
  Filled 2014-08-09: qty 61

## 2014-08-09 MED ORDER — DEXAMETHASONE SODIUM PHOSPHATE 20 MG/5ML IJ SOLN
12.0000 mg | Freq: Once | INTRAMUSCULAR | Status: AC
  Administered 2014-08-09: 12 mg via INTRAVENOUS

## 2014-08-09 MED ORDER — SODIUM CHLORIDE 0.9 % IV SOLN
Freq: Once | INTRAVENOUS | Status: AC
  Administered 2014-08-09: 10:00:00 via INTRAVENOUS

## 2014-08-09 MED ORDER — SODIUM CHLORIDE 0.9 % IJ SOLN
10.0000 mL | INTRAMUSCULAR | Status: DC | PRN
  Administered 2014-08-09: 10 mL
  Filled 2014-08-09: qty 10

## 2014-08-09 MED ORDER — HEPARIN SOD (PORK) LOCK FLUSH 100 UNIT/ML IV SOLN
500.0000 [IU] | Freq: Once | INTRAVENOUS | Status: AC | PRN
  Administered 2014-08-09: 500 [IU]
  Filled 2014-08-09: qty 5

## 2014-08-09 MED ORDER — PALONOSETRON HCL INJECTION 0.25 MG/5ML
0.2500 mg | Freq: Once | INTRAVENOUS | Status: AC
  Administered 2014-08-09: 0.25 mg via INTRAVENOUS

## 2014-08-09 MED ORDER — DEXAMETHASONE SODIUM PHOSPHATE 20 MG/5ML IJ SOLN
INTRAMUSCULAR | Status: AC
  Filled 2014-08-09: qty 5

## 2014-08-09 MED ORDER — SODIUM CHLORIDE 0.9 % IV SOLN
150.0000 mg | Freq: Once | INTRAVENOUS | Status: AC
  Administered 2014-08-09: 150 mg via INTRAVENOUS
  Filled 2014-08-09: qty 5

## 2014-08-09 MED ORDER — DOXORUBICIN HCL CHEMO IV INJECTION 2 MG/ML
60.0000 mg/m2 | Freq: Once | INTRAVENOUS | Status: AC
  Administered 2014-08-09: 122 mg via INTRAVENOUS
  Filled 2014-08-09: qty 61

## 2014-08-09 MED ORDER — PALONOSETRON HCL INJECTION 0.25 MG/5ML
INTRAVENOUS | Status: AC
  Filled 2014-08-09: qty 5

## 2014-08-09 NOTE — Telephone Encounter (Signed)
, °

## 2014-08-09 NOTE — Patient Instructions (Signed)
[facility] Discharge Instructions for Patients Receiving Chemotherapy  Today you received the following chemotherapy agents Adriamycin/Cytoxan.   To help prevent nausea and vomiting after your treatment, we encourage you to take your nausea medication as directed.    If you develop nausea and vomiting that is not controlled by your nausea medication, call the clinic.   BELOW ARE SYMPTOMS THAT SHOULD BE REPORTED IMMEDIATELY:  *FEVER GREATER THAN 100.5 F  *CHILLS WITH OR WITHOUT FEVER  NAUSEA AND VOMITING THAT IS NOT CONTROLLED WITH YOUR NAUSEA MEDICATION  *UNUSUAL SHORTNESS OF BREATH  *UNUSUAL BRUISING OR BLEEDING  TENDERNESS IN MOUTH AND THROAT WITH OR WITHOUT PRESENCE OF ULCERS  *URINARY PROBLEMS  *BOWEL PROBLEMS  UNUSUAL RASH Items with * indicate a potential emergency and should be followed up as soon as possible.  Feel free to call the clinic you have any questions or concerns. The clinic phone number is [phone].  Doxorubicin injection What is this medicine? DOXORUBICIN (dox oh ROO bi sin) is a chemotherapy drug. It is used to treat many kinds of cancer like Hodgkin's disease, leukemia, non-Hodgkin's lymphoma, neuroblastoma, sarcoma, and Wilms' tumor. It is also used to treat bladder cancer, breast cancer, lung cancer, ovarian cancer, stomach cancer, and thyroid cancer. This medicine may be used for other purposes; ask your health care provider or pharmacist if you have questions. COMMON BRAND NAME(S): Adriamycin, Adriamycin PFS, Adriamycin RDF, Rubex What should I tell my health care provider before I take this medicine? They need to know if you have any of these conditions: -blood disorders -heart disease, recent heart attack -infection (especially a virus infection such as chickenpox, cold sores, or herpes) -irregular heartbeat -liver disease -recent or ongoing radiation therapy -an unusual or allergic reaction to doxorubicin, other  chemotherapy agents, other medicines, foods, dyes, or preservatives -pregnant or trying to get pregnant -breast-feeding How should I use this medicine? This drug is given as an infusion into a vein. It is administered in a hospital or clinic by a specially trained health care professional. If you have pain, swelling, burning or any unusual feeling around the site of your injection, tell your health care professional right away. Talk to your pediatrician regarding the use of this medicine in children. Special care may be needed. Overdosage: If you think you have taken too much of this medicine contact a poison control center or emergency room at once. NOTE: This medicine is only for you. Do not share this medicine with others. What if I miss a dose? It is important not to miss your dose. Call your doctor or health care professional if you are unable to keep an appointment. What may interact with this medicine? Do not take this medicine with any of the following medications: -cisapride -droperidol -halofantrine -pimozide -zidovudine This medicine may also interact with the following medications: -chloroquine -chlorpromazine -clarithromycin -cyclophosphamide -cyclosporine -erythromycin -medicines for depression, anxiety, or psychotic disturbances -medicines for irregular heart beat like amiodarone, bepridil, dofetilide, encainide, flecainide, propafenone, quinidine -medicines for seizures like ethotoin, fosphenytoin, phenytoin -medicines for nausea, vomiting like dolasetron, ondansetron, palonosetron -medicines to increase blood counts like filgrastim, pegfilgrastim, sargramostim -methadone -methotrexate -pentamidine -progesterone -vaccines -verapamil Talk to your doctor or health care professional before taking any of these medicines: -acetaminophen -aspirin -ibuprofen -ketoprofen -naproxen This list may not describe all possible interactions. Give your health care provider a  list of all the medicines, herbs, non-prescription drugs, or dietary supplements you use. Also tell them if you smoke, drink alcohol, or  use illegal drugs. Some items may interact with your medicine. What should I watch for while using this medicine? Your condition will be monitored carefully while you are receiving this medicine. You will need important blood work done while you are taking this medicine. This drug may make you feel generally unwell. This is not uncommon, as chemotherapy can affect healthy cells as well as cancer cells. Report any side effects. Continue your course of treatment even though you feel ill unless your doctor tells you to stop. Your urine may turn red for a few days after your dose. This is not blood. If your urine is dark or brown, call your doctor. In some cases, you may be given additional medicines to help with side effects. Follow all directions for their use. Call your doctor or health care professional for advice if you get a fever, chills or sore throat, or other symptoms of a cold or flu. Do not treat yourself. This drug decreases your body's ability to fight infections. Try to avoid being around people who are sick. This medicine may increase your risk to bruise or bleed. Call your doctor or health care professional if you notice any unusual bleeding. Be careful brushing and flossing your teeth or using a toothpick because you may get an infection or bleed more easily. If you have any dental work done, tell your dentist you are receiving this medicine. Avoid taking products that contain aspirin, acetaminophen, ibuprofen, naproxen, or ketoprofen unless instructed by your doctor. These medicines may hide a fever. Men and women of childbearing age should use effective birth control methods while using taking this medicine. Do not become pregnant while taking this medicine. There is a potential for serious side effects to an unborn child. Talk to your health care  professional or pharmacist for more information. Do not breast-feed an infant while taking this medicine. Do not let others touch your urine or other body fluids for 5 days after each treatment with this medicine. Caregivers should wear latex gloves to avoid touching body fluids during this time. There is a maximum amount of this medicine you should receive throughout your life. The amount depends on the medical condition being treated and your overall health. Your doctor will watch how much of this medicine you receive in your lifetime. Tell your doctor if you have taken this medicine before. What side effects may I notice from receiving this medicine? Side effects that you should report to your doctor or health care professional as soon as possible: -allergic reactions like skin rash, itching or hives, swelling of the face, lips, or tongue -low blood counts - this medicine may decrease the number of white blood cells, red blood cells and platelets. You may be at increased risk for infections and bleeding. -signs of infection - fever or chills, cough, sore throat, pain or difficulty passing urine -signs of decreased platelets or bleeding - bruising, pinpoint red spots on the skin, black, tarry stools, blood in the urine -signs of decreased red blood cells - unusually weak or tired, fainting spells, lightheadedness -breathing problems -chest pain -fast, irregular heartbeat -mouth sores -nausea, vomiting -pain, swelling, redness at site where injected -pain, tingling, numbness in the hands or feet -swelling of ankles, feet, or hands -unusual bleeding or bruising Side effects that usually do not require medical attention (report to your doctor or health care professional if they continue or are bothersome): -diarrhea -facial flushing -hair loss -loss of appetite -missed menstrual periods -nail discoloration or damage -  red or watery eyes -red colored urine -stomach upset This list may not  describe all possible side effects. Call your doctor for medical advice about side effects. You may report side effects to FDA at 1-800-FDA-1088. Where should I keep my medicine? This drug is given in a hospital or clinic and will not be stored at home. NOTE: This sheet is a summary. It may not cover all possible information. If you have questions about this medicine, talk to your doctor, pharmacist, or health care provider.  2015, [employer]. (2013-02-07 09:54:34)  Cyclophosphamide injection What is this medicine? CYCLOPHOSPHAMIDE (sye kloe FOSS fa mide) is a chemotherapy drug. It slows the growth of cancer cells. This medicine is used to treat many types of cancer like lymphoma, myeloma, leukemia, breast cancer, and ovarian cancer, to name a few. This medicine may be used for other purposes; ask your health care provider or pharmacist if you have questions. COMMON BRAND NAME(S): Cytoxan, Neosar What should I tell my health care provider before I take this medicine? They need to know if you have any of these conditions: -blood disorders -history of other chemotherapy -infection -kidney disease -liver disease -recent or ongoing radiation therapy -tumors in the bone marrow -an unusual or allergic reaction to cyclophosphamide, other chemotherapy, other medicines, foods, dyes, or preservatives -pregnant or trying to get pregnant -breast-feeding How should I use this medicine? This drug is usually given as an injection into a vein or muscle or by infusion into a vein. It is administered in a hospital or clinic by a specially trained health care professional. Talk to your pediatrician regarding the use of this medicine in children. Special care may be needed. Overdosage: If you think you have taken too much of this medicine contact a poison control center or emergency room at once. NOTE: This medicine is only for you. Do not share this medicine with others. What if I miss a  dose? It is important not to miss your dose. Call your doctor or health care professional if you are unable to keep an appointment. What may interact with this medicine? This medicine may interact with the following medications: -amiodarone -amphotericin B -azathioprine -certain antiviral medicines for HIV or AIDS such as protease inhibitors (e.g., indinavir, ritonavir) and zidovudine -certain blood pressure medications such as benazepril, captopril, enalapril, fosinopril, lisinopril, moexipril, monopril, perindopril, quinapril, ramipril, trandolapril -certain cancer medications such as anthracyclines (e.g., daunorubicin, doxorubicin), busulfan, cytarabine, paclitaxel, pentostatin, tamoxifen, trastuzumab -certain diuretics such as chlorothiazide, chlorthalidone, hydrochlorothiazide, indapamide, metolazone -certain medicines that treat or prevent blood clots like warfarin -certain muscle relaxants such as succinylcholine -cyclosporine -etanercept -indomethacin -medicines to increase blood counts like filgrastim, pegfilgrastim, sargramostim -medicines used as general anesthesia -metronidazole -natalizumab This list may not describe all possible interactions. Give your health care provider a list of all the medicines, herbs, non-prescription drugs, or dietary supplements you use. Also tell them if you smoke, drink alcohol, or use illegal drugs. Some items may interact with your medicine. What should I watch for while using this medicine? Visit your doctor for checks on your progress. This drug may make you feel generally unwell. This is not uncommon, as chemotherapy can affect healthy cells as well as cancer cells. Report any side effects. Continue your course of treatment even though you feel ill unless your doctor tells you to stop. Drink water or other fluids as directed. Urinate often, even at night. In some cases, you may be given additional medicines to help with side effects. Follow all  directions for their use. Call your doctor or health care professional for advice if you get a fever, chills or sore throat, or other symptoms of a cold or flu. Do not treat yourself. This drug decreases your body's ability to fight infections. Try to avoid being around people who are sick. This medicine may increase your risk to bruise or bleed. Call your doctor or health care professional if you notice any unusual bleeding. Be careful brushing and flossing your teeth or using a toothpick because you may get an infection or bleed more easily. If you have any dental work done, tell your dentist you are receiving this medicine. You may get drowsy or dizzy. Do not drive, use machinery, or do anything that needs mental alertness until you know how this medicine affects you. Do not become pregnant while taking this medicine or for 1 year after stopping it. Women should inform their doctor if they wish to become pregnant or think they might be pregnant. Men should not father a child while taking this medicine and for 4 months after stopping it. There is a potential for serious side effects to an unborn child. Talk to your health care professional or pharmacist for more information. Do not breast-feed an infant while taking this medicine. This medicine may interfere with the ability to have a child. This medicine has caused ovarian failure in some women. This medicine has caused reduced sperm counts in some men. You should talk with your doctor or health care professional if you are concerned about your fertility. If you are going to have surgery, tell your doctor or health care professional that you have taken this medicine. What side effects may I notice from receiving this medicine? Side effects that you should report to your doctor or health care professional as soon as possible: -allergic reactions like skin rash, itching or hives, swelling of the face, lips, or tongue -low blood counts - this medicine may  decrease the number of white blood cells, red blood cells and platelets. You may be at increased risk for infections and bleeding. -signs of infection - fever or chills, cough, sore throat, pain or difficulty passing urine -signs of decreased platelets or bleeding - bruising, pinpoint red spots on the skin, black, tarry stools, blood in the urine -signs of decreased red blood cells - unusually weak or tired, fainting spells, lightheadedness -breathing problems -dark urine -dizziness -palpitations -swelling of the ankles, feet, hands -trouble passing urine or change in the amount of urine -weight gain -yellowing of the eyes or skin Side effects that usually do not require medical attention (report to your doctor or health care professional if they continue or are bothersome): -changes in nail or skin color -hair loss -missed menstrual periods -mouth sores -nausea, vomiting This list may not describe all possible side effects. Call your doctor for medical advice about side effects. You may report side effects to FDA at 1-800-FDA-1088. Where should I keep my medicine? This drug is given in a hospital or clinic and will not be stored at home. NOTE: This sheet is a summary. It may not cover all possible information. If you have questions about this medicine, talk to your doctor, pharmacist, or health care provider.  2015, [employer]. (2012-08-26 16:22:58)   Pegfilgrastim injection What is this medicine? PEGFILGRASTIM (peg fil GRA stim) is a long-acting granulocyte colony-stimulating factor that stimulates the growth of neutrophils, a type of white blood cell important in the body's fight against infection. It is used  to reduce the incidence of fever and infection in patients with certain types of cancer who are receiving chemotherapy that affects the bone marrow. This medicine may be used for other purposes; ask your health care provider or pharmacist if you have  questions. COMMON BRAND NAME(S): Neulasta What should I tell my health care provider before I take this medicine? They need to know if you have any of these conditions: -latex allergy -ongoing radiation therapy -sickle cell disease -skin reactions to acrylic adhesives (On-Body Injector only) -an unusual or allergic reaction to pegfilgrastim, filgrastim, other medicines, foods, dyes, or preservatives -pregnant or trying to get pregnant -breast-feeding How should I use this medicine? This medicine is for injection under the skin. If you get this medicine at home, you will be taught how to prepare and give the pre-filled syringe or how to use the On-body Injector. Refer to the patient Instructions for Use for detailed instructions. Use exactly as directed. Take your medicine at regular intervals. Do not take your medicine more often than directed. It is important that you put your used needles and syringes in a special sharps container. Do not put them in a trash can. If you do not have a sharps container, call your pharmacist or healthcare provider to get one. Talk to your pediatrician regarding the use of this medicine in children. Special care may be needed. Overdosage: If you think you have taken too much of this medicine contact a poison control center or emergency room at once. NOTE: This medicine is only for you. Do not share this medicine with others. What if I miss a dose? It is important not to miss your dose. Call your doctor or health care professional if you miss your dose. If you miss a dose due to an On-body Injector failure or leakage, a new dose should be administered as soon as possible using a single prefilled syringe for manual use. What may interact with this medicine? Interactions have not been studied. Give your health care provider a list of all the medicines, herbs, non-prescription drugs, or dietary supplements you use. Also tell them if you smoke, drink alcohol, or use  illegal drugs. Some items may interact with your medicine. This list may not describe all possible interactions. Give your health care provider a list of all the medicines, herbs, non-prescription drugs, or dietary supplements you use. Also tell them if you smoke, drink alcohol, or use illegal drugs. Some items may interact with your medicine. What should I watch for while using this medicine? You may need blood work done while you are taking this medicine. If you are going to need a MRI, CT scan, or other procedure, tell your doctor that you are using this medicine (On-Body Injector only). What side effects may I notice from receiving this medicine? Side effects that you should report to your doctor or health care professional as soon as possible: -allergic reactions like skin rash, itching or hives, swelling of the face, lips, or tongue -dizziness -fever -pain, redness, or irritation at site where injected -pinpoint red spots on the skin -shortness of breath or breathing problems -stomach or side pain, or pain at the shoulder -swelling -tiredness -trouble passing urine Side effects that usually do not require medical attention (report to your doctor or health care professional if they continue or are bothersome): -bone pain -muscle pain This list may not describe all possible side effects. Call your doctor for medical advice about side effects. You may report side effects to  FDA at 1-800-FDA-1088. Where should I keep my medicine? Keep out of the reach of children. Store pre-filled syringes in a refrigerator between 2 and 8 degrees C (36 and 46 degrees F). Do not freeze. Keep in carton to protect from light. Throw away this medicine if it is left out of the refrigerator for more than 48 hours. Throw away any unused medicine after the expiration date. NOTE: This sheet is a summary. It may not cover all possible information. If you have questions about this medicine, talk to your doctor,  pharmacist, or health care provider.  2015, [employer]. (2014-01-11 16:14:05)

## 2014-08-09 NOTE — Progress Notes (Signed)
Patient Care Team: Rachell Cipro, MD as PCP - General (Family Medicine) [facility]  Rulon Eisenmenger, MD as Consulting Physician (Hematology and Oncology) [name], MD as Consulting Physician (General Surgery) Eppie Gibson, MD as Attending Physician (Radiation Oncology)  DIAGNOSIS: Breast cancer of upper-outer quadrant of left female breast   Primary site: Breast (Left), L1 bone metastases   Staging method: AJCC 7th Edition   Clinical: Stage IV (T2, N1, cM1)   Summary: Stage IV (T2, N1, cM1)    SUMMARY OF ONCOLOGIC HISTORY:   Breast cancer of upper-outer quadrant of left female breast   05/18/2014 Mammogram Suspicious left upper outer quadrant 5 cm segmental area of calcifications.    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   05/30/2014 Breast MRI Left Breast: 12oclock: 21 x 23 x 30 mm, Numerous  level 1 and 2 LN; Liver lesions cycts by Liver MRI   06/07/2014 Initial Biopsy Left axillary lymph node biopsy invasive ductal carcinoma ER 100%, PR 11% Ki-67 15% HER-2 negative ratio 1.41   06/13/2014 PET scan and metabolic she were left breast, hypermetabolic left axillary and left retropectoral lymph node, small lucent lesion in L1 vertebra which was biopsied and proven to be bone metastases   07/19/2014 Initial Biopsy Biopsy of L1 vertebra metastatic carcinoma breast primary, ER 100%, PR 4%, HER-2 negative ratio 1.3    CHIEF COMPLIANT: neoadjuvant chemotherapy  INTERVAL HISTORY: Kimberly Reed is a cyst-year-old Caucasian lady with above-mentioned history of left-sided breast cancer with L1 metastatic disease but currently she is being treated with definite of neoadjuvant chemotherapy with Adriamycin Cytoxan.   Kimberly Reed is doing well today.  She is here for cycle 2 day 1 of Adriamycin and Cytoxan.  She would like to know if she can take [employer] oil.  She says that it is used alongside  of chemotherapy treatment.  She would like to know what Dr. Lindi Adie thinks about this.  She does have some constipation, and manages this with apricots, benefiber, colace, and increasing fiber and steel cut oatmeal.  She denies fevers, chills, nausea, vomiting, diarrhea, skin changes, or any further concerns.    REVIEW OF SYSTEMS:   A 10 point review of systems was conducted and is otherwise negative except for what is noted above.    Past Medical History  Diagnosis Date  . Hypothyroid   . Glaucoma   . Hyperlipemia   . Migraine without aura   . History of syncope 2008    loss of bladder control eval by Neuro  . Atypical chest pain   . Family history of heart disease   . Anxiety   . Depression   . Hot flashes   . Anemia   . Insomnia   . Cancer     left breast dx. 0'97- chemo planned to start 07-09-14- Dr. Lindi Adie[facility]   Family History  Problem Relation Age of Onset  . Aneurysm Mother   . Kidney disease Mother   . Hypertension Father   . Dementia Father   . Kidney disease Brother   . Heart disease      "all of moms side"  . Heart failure Cousin   . Depression     Past Surgical History  Procedure Laterality Date  . Cataract extraction Left   . Glaucoma surgery Left     x1   . Examination under anesthesia  02/24/2013    Procedure: EXAM  UNDER ANESTHESIA;  Surgeon: Azalia Bilis, MD;  Location: [facility];  Service: Gynecology;;  with removal of prolapsing cervical mass; pap smear and endometrial biopsy  . Uterine polyp removed      per hysteroscopy about 1 1/2 yrs ago.  . Esophagogastroduodenoscopy  06/01/14  . Tonsillectomy      child  . Portacath placement Right 07/06/2014    Procedure: PORT A CATH  PLACEMENT;  Surgeon: [name], MD;  Location: [facility];  Service: General;  Laterality: Right;   History  Substance Use Topics  . Smoking status: Never Smoker   . Smokeless tobacco: Never Used  . Alcohol Use: No     ALLERGIES:  is allergic to other  and effexor.  MEDICATIONS:  Current Outpatient Prescriptions  Medication Sig Dispense Refill  . acidophilus (RISAQUAD) CAPS capsule Take 1 capsule by mouth daily.      [name] ALPRAZolam (XANAX) 0.5 MG tablet Take 1 tablet (0.5 mg total) by mouth 3 (three) times daily as needed for anxiety.  60 tablet  3  . ARIPiprazole (ABILIFY) 10 MG tablet Take 5 mg by mouth every morning.       . ARIPiprazole (ABILIFY) 2 MG tablet Take 1 tablet (2 mg total) by mouth daily.  90 tablet  3  . brimonidine-timolol (COMBIGAN) 0.2-0.5 % ophthalmic solution Place 1 drop into the right eye every 12 (twelve) hours.       . Cholecalciferol (VITAMIN D-3) 5000 UNITS TABS Take 1 tablet by mouth daily.      [name] dexamethasone (DECADRON) 4 MG tablet Take 2 tablets by mouth once a day on the day after chemotherapy and then take 2 tablets two times a day for 2 days. Take with food.  30 tablet  1  . dorzolamide (TRUSOPT) 2 % ophthalmic solution Place 1 drop into the right eye 2 (two) times daily.       . Flaxseed, Linseed, (FLAX SEED OIL PO) Take 1 capsule by mouth daily.      [name] HYDROcodone-acetaminophen (NORCO) 5-325 MG per tablet Take 1-2 tablets by mouth every 6 (six) hours as needed.  30 tablet  0  . lidocaine-prilocaine (EMLA) cream Apply 1 application topically as needed.  30 g  0  . LORazepam (ATIVAN) 0.5 MG tablet Take 1 tablet (0.5 mg total) by mouth every 6 (six) hours as needed (Nausea or vomiting).  30 tablet  0  . LORazepam (ATIVAN) 1 MG tablet Take 1 tablet (1 mg total) by mouth 2 (two) times daily as needed for anxiety.  60 tablet  0  . Omega-3 Fatty Acids (OMEGA 3 PO) Take 1 capsule by mouth daily.      . ondansetron (ZOFRAN) 8 MG tablet Take 1 tablet (8 mg total) by mouth 2 (two) times daily as needed. Start on the third day after chemotherapy.  30 tablet  1  . OVER THE COUNTER MEDICATION Take 1 capsule by mouth daily.      . prochlorperazine (COMPAZINE) 10 MG tablet Take 1 tablet (10 mg total) by mouth every 6 (six)  hours as needed (Nausea or vomiting).  30 tablet  1  . thyroid (ARMOUR THYROID) 180 MG tablet Take 1 tablet (180 mg total) by mouth daily before breakfast.  90 tablet  2  . Travoprost, BAK Free, (TRAVATAN) 0.004 % SOLN ophthalmic solution Place 1 drop into the right eye at bedtime.       . WHEY PROTEIN PO Take 1 scoop by mouth daily.  No current facility-administered medications for this visit.    PHYSICAL EXAMINATION: ECOG PERFORMANCE STATUS: 1 - Symptomatic but completely ambulatory  Filed Vitals:   08/09/14 0843  BP: 106/59  Pulse: 61  Temp: 97.4 F (36.3 C)  Resp: 20   Filed Weights   08/09/14 0843  Weight: 198 lb 3.2 oz (89.903 kg)    GENERAL: Patient is a well appearing female in no acute distress HEENT:  Sclerae anicteric.  Oropharynx clear and moist. No ulcerations or evidence of oropharyngeal candidiasis. Neck is supple.  NODES:  No cervical, supraclavicular, or axillary lymphadenopathy palpated.  BREAST EXAM:  Deferred. LUNGS:  Clear to auscultation bilaterally.  No wheezes or rhonchi. HEART:  Regular rate and rhythm. No murmur appreciated. ABDOMEN:  Soft, nontender.  Positive, normoactive bowel sounds. No organomegaly palpated. MSK:  No focal spinal tenderness to palpation. Full range of motion bilaterally in the upper extremities. EXTREMITIES:  No peripheral edema.   SKIN:  Clear with no obvious rashes or skin changes. No nail dyscrasia. NEURO:  Nonfocal. Well oriented.  Appropriate affect.    LABORATORY DATA:  I have reviewed the data as listed   Chemistry      Component Value Date/Time   NA 137 08/02/2014 0956   NA 141 07/19/2014 0959   K 4.0 08/02/2014 0956   K 4.1 07/19/2014 0959   CL 105 07/19/2014 0959   CO2 29 08/02/2014 0956   CO2 25 07/19/2014 0959   BUN 11.9 08/02/2014 0956   BUN 20 07/19/2014 0959   CREATININE 0.7 08/02/2014 0956   CREATININE 0.61 07/19/2014 0959   CREATININE 0.73 03/05/2014 1044      Component Value Date/Time   CALCIUM 10.0  08/02/2014 0956   CALCIUM 9.9 07/19/2014 0959   ALKPHOS 70 08/02/2014 0956   ALKPHOS 53 06/06/2014 1229   AST 11 08/02/2014 0956   AST 14 06/06/2014 1229   ALT 14 08/02/2014 0956   ALT 12 06/06/2014 1229   BILITOT 0.53 08/02/2014 0956   BILITOT 0.3 06/06/2014 1229       Lab Results  Component Value Date   WBC 8.5 08/09/2014   HGB 10.0* 08/09/2014   HCT 30.5* 08/09/2014   MCV 89.6 08/09/2014   PLT 249 08/09/2014   NEUTROABS 6.0 08/09/2014     ASSESSMENT & PLAN:   58 year old woman with stage 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  She was determined to have L1 metastatic disease on PET scan and bone biopsy.  She is currently undergoing neoadjuvant chemotherapy with dose dense Adriamycin and Cytoxan.    Kimberly Reed is doing well today.  Her WBC has recovered and she will proceed with chemotherapy today.  She does have a treatment related anemia that we will monitor.  Her constipation is managed as above and she will continue this.    I reviewed her taking black [employer] seed oil with pharmacy, and they could find no evidence supporting it, or details about it.  I gave that information to Dejane today.  I also discussed it with Dr. Lindi Adie who has cleared her to take this.    The patient has a good understanding of the overall plan. she agrees with it. She will call with any problems that may develop before her next visit here.  I spent 25 minutes counseling the patient face to face. The total time spent in the appointment was 30 minutes and more than 50% was on counseling and review of test results  Minette Headland, [place] [phone]  08/09/2014 8:57 AM

## 2014-08-09 NOTE — Telephone Encounter (Signed)
Per staff message and POF I have scheduled appts. Advised scheduler of appts. JMW  

## 2014-08-10 ENCOUNTER — Ambulatory Visit (HOSPITAL_BASED_OUTPATIENT_CLINIC_OR_DEPARTMENT_OTHER): Payer: Managed Care, Other (non HMO)

## 2014-08-10 VITALS — BP 114/64 | HR 67 | Temp 97.8°F

## 2014-08-10 DIAGNOSIS — Z5189 Encounter for other specified aftercare: Secondary | ICD-10-CM

## 2014-08-10 DIAGNOSIS — C50412 Malignant neoplasm of upper-outer quadrant of left female breast: Secondary | ICD-10-CM

## 2014-08-10 MED ORDER — PEGFILGRASTIM INJECTION 6 MG/0.6ML
6.0000 mg | Freq: Once | SUBCUTANEOUS | Status: AC
Start: 2014-08-10 — End: 2014-08-10
  Administered 2014-08-10: 6 mg via SUBCUTANEOUS
  Filled 2014-08-10: qty 0.6

## 2014-08-10 NOTE — Patient Instructions (Signed)
Pegfilgrastim injection What is this medicine? PEGFILGRASTIM (peg fil GRA stim) is a long-acting granulocyte colony-stimulating factor that stimulates the growth of neutrophils, a type of white blood cell important in the body's fight against infection. It is used to reduce the incidence of fever and infection in patients with certain types of cancer who are receiving chemotherapy that affects the bone marrow. This medicine may be used for other purposes; ask your health care provider or pharmacist if you have questions. COMMON BRAND NAME(S): Neulasta What should I tell my health care provider before I take this medicine? They need to know if you have any of these conditions: -latex allergy -ongoing radiation therapy -sickle cell disease -skin reactions to acrylic adhesives (On-Body Injector only) -an unusual or allergic reaction to pegfilgrastim, filgrastim, other medicines, foods, dyes, or preservatives -pregnant or trying to get pregnant -breast-feeding How should I use this medicine? This medicine is for injection under the skin. If you get this medicine at home, you will be taught how to prepare and give the pre-filled syringe or how to use the On-body Injector. Refer to the patient Instructions for Use for detailed instructions. Use exactly as directed. Take your medicine at regular intervals. Do not take your medicine more often than directed. It is important that you put your used needles and syringes in a special sharps container. Do not put them in a trash can. If you do not have a sharps container, call your pharmacist or healthcare provider to get one. Talk to your pediatrician regarding the use of this medicine in children. Special care may be needed. Overdosage: If you think you have taken too much of this medicine contact a poison control center or emergency room at once. NOTE: This medicine is only for you. Do not share this medicine with others. What if I miss a dose? It is  important not to miss your dose. Call your doctor or health care professional if you miss your dose. If you miss a dose due to an On-body Injector failure or leakage, a new dose should be administered as soon as possible using a single prefilled syringe for manual use. What may interact with this medicine? Interactions have not been studied. Give your health care provider a list of all the medicines, herbs, non-prescription drugs, or dietary supplements you use. Also tell them if you smoke, drink alcohol, or use illegal drugs. Some items may interact with your medicine. This list may not describe all possible interactions. Give your health care provider a list of all the medicines, herbs, non-prescription drugs, or dietary supplements you use. Also tell them if you smoke, drink alcohol, or use illegal drugs. Some items may interact with your medicine. What should I watch for while using this medicine? You may need blood work done while you are taking this medicine. If you are going to need a MRI, CT scan, or other procedure, tell your doctor that you are using this medicine (On-Body Injector only). What side effects may I notice from receiving this medicine? Side effects that you should report to your doctor or health care professional as soon as possible: -allergic reactions like skin rash, itching or hives, swelling of the face, lips, or tongue -dizziness -fever -pain, redness, or irritation at site where injected -pinpoint red spots on the skin -shortness of breath or breathing problems -stomach or side pain, or pain at the shoulder -swelling -tiredness -trouble passing urine Side effects that usually do not require medical attention (report to your doctor   or health care professional if they continue or are bothersome): -bone pain -muscle pain This list may not describe all possible side effects. Call your doctor for medical advice about side effects. You may report side effects to FDA at  1-800-FDA-1088. Where should I keep my medicine? Keep out of the reach of children. Store pre-filled syringes in a refrigerator between 2 and 8 degrees C (36 and 46 degrees F). Do not freeze. Keep in carton to protect from light. Throw away this medicine if it is left out of the refrigerator for more than 48 hours. Throw away any unused medicine after the expiration date. NOTE: This sheet is a summary. It may not cover all possible information. If you have questions about this medicine, talk to your doctor, pharmacist, or health care provider.  2015, [employer]. (2014-01-11 16:14:05)  

## 2014-08-13 ENCOUNTER — Telehealth: Payer: Self-pay

## 2014-08-13 NOTE — Telephone Encounter (Signed)
Pt LMOVM requesting referral to nutritionist.  Inbskt to Dr Lindi Adie.

## 2014-08-16 ENCOUNTER — Telehealth: Payer: Self-pay | Admitting: Hematology and Oncology

## 2014-08-16 ENCOUNTER — Ambulatory Visit: Payer: Managed Care, Other (non HMO) | Admitting: Hematology and Oncology

## 2014-08-16 ENCOUNTER — Other Ambulatory Visit: Payer: Managed Care, Other (non HMO)

## 2014-08-16 VITALS — BP 121/70 | HR 67 | Temp 98.4°F | Resp 18 | Ht 65.0 in | Wt 192.9 lb

## 2014-08-16 DIAGNOSIS — K7689 Other specified diseases of liver: Secondary | ICD-10-CM

## 2014-08-16 DIAGNOSIS — Z23 Encounter for immunization: Secondary | ICD-10-CM

## 2014-08-16 DIAGNOSIS — C50412 Malignant neoplasm of upper-outer quadrant of left female breast: Secondary | ICD-10-CM

## 2014-08-16 DIAGNOSIS — C773 Secondary and unspecified malignant neoplasm of axilla and upper limb lymph nodes: Secondary | ICD-10-CM

## 2014-08-16 DIAGNOSIS — C7951 Secondary malignant neoplasm of bone: Secondary | ICD-10-CM

## 2014-08-16 MED ORDER — INFLUENZA VAC SPLIT QUAD 0.5 ML IM SUSY
0.5000 mL | PREFILLED_SYRINGE | Freq: Once | INTRAMUSCULAR | Status: AC
  Administered 2014-08-16: 0.5 mL via INTRAMUSCULAR
  Filled 2014-08-16: qty 0.5

## 2014-08-16 NOTE — Progress Notes (Signed)
[employer] attending physician stmt for leave put in [place] box.

## 2014-08-16 NOTE — Telephone Encounter (Signed)
, °

## 2014-08-16 NOTE — Assessment & Plan Note (Addendum)
Metastatic breast cancer in the left breast involving left axillary lymph nodes and isolated L1 vertebral metastases stage IV disease  On chemotherapy monitoring: Today is cycle 2 day 8 of dose dense Adriamycin and Cytoxan. So far patient is tolerating chemotherapy fairly well other than constipation issues which have resolved by taking stool softeners. patient does not have any nausea vomiting.  Monitoring closely for chemotherapy related toxicities. she'll return in one week for cycle 3.

## 2014-08-16 NOTE — Progress Notes (Signed)
Patient Care Team: Rachell Cipro, MD as PCP - General (Family Medicine) [facility]  Rulon Eisenmenger, MD as Consulting Physician (Hematology and Oncology) [name], MD as Consulting Physician (General Surgery) Eppie Gibson, MD as Attending Physician (Radiation Oncology)  DIAGNOSIS: Breast cancer of upper-outer quadrant of left female breast   Primary site: Breast (Left)   Staging method: AJCC 7th Edition   Clinical: Stage IV (T2, N1, M1) signed by Rulon Eisenmenger, MD on 08/16/2014  7:34 AM   Summary: Stage IV (T2, N1, M1)   Clinical comments: Staged at breast conference 06/06/14.   SUMMARY OF ONCOLOGIC HISTORY:   Breast cancer of upper-outer quadrant of left female breast   05/18/2014 Mammogram Suspicious left upper outer quadrant 5 cm segmental area of calcifications.    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   05/30/2014 Breast MRI Left Breast: 12oclock: 21 x 23 x 30 mm, Numerous  level 1 and 2 LN; Liver lesions cycts by Liver MRI   06/07/2014 Initial Biopsy Left axillary lymph node biopsy invasive ductal carcinoma ER 100%, PR 11% Ki-67 15% HER-2 negative ratio 1.41   06/13/2014 PET scan and metabolic she were left breast, hypermetabolic left axillary and left retropectoral lymph node, small lucent lesion in L1 vertebra which was biopsied and proven to be bone metastases   07/19/2014 Initial Biopsy Biopsy of L1 vertebra metastatic carcinoma breast primary, ER 100%, PR 4%, HER-2 negative ratio 1.3   07/26/2014 -  Neo-Adjuvant Chemotherapy Even though she has L1 solitary bone metastases, patient is being treated definitively with neoadjuvant chemotherapy with dose dense Adriamycin and Cytoxan to be followed by weekly Taxol x12    CHIEF COMPLIANT: Patient is here for cycle 2 day 8  INTERVAL HISTORY: Kimberly Reed is a 58 year old Caucasian with above-mentioned history of stage IV breast  cancer with solitary L1 vertebral body metastases. She received cycle 2 of chemotherapy a week ago and is here today to discuss side effects and treatment. She reports no new problems or concerns. She has been tolerating chemotherapy extremely well. She denied any nausea vomiting. She had constipation issues which have now resolved. Patient reports that the breast mass feels extremely small and adjusting monitoring very well to chemotherapy  REVIEW OF SYSTEMS:   Constitutional: Denies fevers, chills or abnormal weight loss Eyes: Denies blurriness of vision Ears, nose, mouth, throat, and face: Denies mucositis or sore throat Respiratory: Denies cough, dyspnea or wheezes Cardiovascular: Denies palpitation, chest discomfort or lower extremity swelling Gastrointestinal:  Denies nausea, heartburn or change in bowel habits Skin: Denies abnormal skin rashes Lymphatics: Denies new lymphadenopathy or easy bruising Neurological:Denies numbness, tingling or new weaknesses Behavioral/Psych: Mood is stable, no new changes  Breast:  denies any pain or lumps or nodules in either breasts All other systems were reviewed with the patient and are negative.  I have reviewed the past medical history, past surgical history, social history and family history with the patient and they are unchanged from previous note.  ALLERGIES:  is allergic to other and effexor.  MEDICATIONS:  Current Outpatient Prescriptions  Medication Sig Dispense Refill  . acidophilus (RISAQUAD) CAPS capsule Take 1 capsule by mouth daily.      [name] ALPRAZolam (XANAX) 0.5 MG tablet Take 1 tablet (0.5 mg total) by mouth 3 (three) times daily as needed for anxiety.  60 tablet  3  . ARIPiprazole (ABILIFY) 10 MG tablet Take 5 mg by mouth every  morning.       . ARIPiprazole (ABILIFY) 2 MG tablet Take 1 tablet (2 mg total) by mouth daily.  90 tablet  3  . brimonidine-timolol (COMBIGAN) 0.2-0.5 % ophthalmic solution Place 1 drop into the right eye  every 12 (twelve) hours.       . Cholecalciferol (VITAMIN D-3) 5000 UNITS TABS Take 1 tablet by mouth daily.      [name] dexamethasone (DECADRON) 4 MG tablet Take 2 tablets by mouth once a day on the day after chemotherapy and then take 2 tablets two times a day for 2 days. Take with food.  30 tablet  1  . dorzolamide (TRUSOPT) 2 % ophthalmic solution Place 1 drop into the right eye 2 (two) times daily.       . Flaxseed, Linseed, (FLAX SEED OIL PO) Take 1 capsule by mouth daily.      [name] HYDROcodone-acetaminophen (NORCO) 5-325 MG per tablet Take 1-2 tablets by mouth every 6 (six) hours as needed.  30 tablet  0  . lidocaine-prilocaine (EMLA) cream Apply 1 application topically as needed.  30 g  0  . LORazepam (ATIVAN) 0.5 MG tablet Take 1 tablet (0.5 mg total) by mouth every 6 (six) hours as needed (Nausea or vomiting).  30 tablet  0  . LORazepam (ATIVAN) 1 MG tablet Take 1 tablet (1 mg total) by mouth 2 (two) times daily as needed for anxiety.  60 tablet  0  . Omega-3 Fatty Acids (OMEGA 3 PO) Take 1 capsule by mouth daily.      . ondansetron (ZOFRAN) 8 MG tablet Take 1 tablet (8 mg total) by mouth 2 (two) times daily as needed. Start on the third day after chemotherapy.  30 tablet  1  . OVER THE COUNTER MEDICATION Take 1 capsule by mouth daily.      . prochlorperazine (COMPAZINE) 10 MG tablet Take 1 tablet (10 mg total) by mouth every 6 (six) hours as needed (Nausea or vomiting).  30 tablet  1  . thyroid (ARMOUR THYROID) 180 MG tablet Take 1 tablet (180 mg total) by mouth daily before breakfast.  90 tablet  2  . Travoprost, BAK Free, (TRAVATAN) 0.004 % SOLN ophthalmic solution Place 1 drop into the right eye at bedtime.       . WHEY PROTEIN PO Take 1 scoop by mouth daily.       No current facility-administered medications for this visit.    PHYSICAL EXAMINATION: ECOG PERFORMANCE STATUS: 1 - Symptomatic but completely ambulatory  Filed Vitals:   08/16/14 0818  BP: 121/70  Pulse: 67  Temp: 98.4 F  (36.9 C)  Resp: 18   Filed Weights   08/16/14 0818  Weight: 192 lb 14.4 oz (87.499 kg)    GENERAL:alert, no distress and comfortable SKIN: skin color, texture, turgor are normal, no rashes or significant lesions EYES: normal, Conjunctiva are pink and non-injected, sclera clear OROPHARYNX:no exudate, no erythema and lips, buccal mucosa, and tongue normal  NECK: supple, thyroid normal size, non-tender, without nodularity LYMPH:  no palpable lymphadenopathy in the cervical, axillary or inguinal LUNGS: clear to auscultation and percussion with normal breathing effort HEART: regular rate & rhythm and no murmurs and no lower extremity edema ABDOMEN:abdomen soft, non-tender and normal bowel sounds Musculoskeletal:no cyanosis of digits and no clubbing  NEURO: alert & oriented x 3 with fluent speech, no focal motor/sensory deficits Breast exam: Very small palpable left breast lump is felt previously it was extremely large LABORATORY DATA:  I have reviewed the data as listed   Chemistry      Component Value Date/Time   NA 139 08/09/2014 0837   NA 141 07/19/2014 0959   K 4.0 08/09/2014 0837   K 4.1 07/19/2014 0959   CL 105 07/19/2014 0959   CO2 25 08/09/2014 0837   CO2 25 07/19/2014 0959   BUN 11.9 08/09/2014 0837   BUN 20 07/19/2014 0959   CREATININE 0.7 08/09/2014 0837   CREATININE 0.61 07/19/2014 0959   CREATININE 0.73 03/05/2014 1044      Component Value Date/Time   CALCIUM 9.9 08/09/2014 0837   CALCIUM 9.9 07/19/2014 0959   ALKPHOS 71 08/09/2014 0837   ALKPHOS 53 06/06/2014 1229   AST 12 08/09/2014 0837   AST 14 06/06/2014 1229   ALT 17 08/09/2014 0837   ALT 12 06/06/2014 1229   BILITOT <0.20 08/09/2014 0837   BILITOT 0.3 06/06/2014 1229       Lab Results  Component Value Date   WBC 8.5 08/09/2014   HGB 10.0* 08/09/2014   HCT 30.5* 08/09/2014   MCV 89.6 08/09/2014   PLT 249 08/09/2014   NEUTROABS 6.0 08/09/2014     RADIOGRAPHIC STUDIES: I have personally reviewed the  radiology reports and agreed with their findings. No results found.   ASSESSMENT & PLAN:  Breast cancer of upper-outer quadrant of left female breast Metastatic breast cancer in the left breast involving left axillary lymph nodes and isolated L1 vertebral metastases stage IV disease  On chemotherapy monitoring: Today is cycle 2 day 8 of dose dense Adriamycin and Cytoxan. So far patient is tolerating chemotherapy fairly well other than constipation issues which have resolved by taking stool softeners. patient does not have any nausea vomiting.  Monitoring closely for chemotherapy related toxicities. she'll return in one week for cycle 3.   Clinically the patient's mass in the breast is much smaller suggesting clinical response to treatment. I do not believe there is any need for that point MRI. We will monitor her clinically. She will finish 4 cycles of Adriamycin Cytoxan and then will be followed by weekly Taxol x12  Orders Placed This Encounter  Procedures  . CBC with Differential    Standing Status: Standing     Number of Occurrences: 16     Standing Expiration Date: 08/17/2015  . Comprehensive metabolic panel (Cmet) - [facility]    Standing Status: Standing     Number of Occurrences: 16     Standing Expiration Date: 08/17/2015   The patient has a good understanding of the overall plan. she agrees with it. She will call with any problems that may develop before her next visit here.  I spent 20 minutes counseling the patient face to face. The total time spent in the appointment was 25 minutes and more than 50% was on counseling and review of test results    Rulon Eisenmenger, MD 08/16/2014 9:26 AM

## 2014-08-23 ENCOUNTER — Ambulatory Visit (HOSPITAL_BASED_OUTPATIENT_CLINIC_OR_DEPARTMENT_OTHER): Payer: Managed Care, Other (non HMO) | Admitting: Hematology and Oncology

## 2014-08-23 ENCOUNTER — Other Ambulatory Visit: Payer: Managed Care, Other (non HMO)

## 2014-08-23 ENCOUNTER — Telehealth: Payer: Self-pay | Admitting: Hematology and Oncology

## 2014-08-23 ENCOUNTER — Ambulatory Visit (HOSPITAL_BASED_OUTPATIENT_CLINIC_OR_DEPARTMENT_OTHER): Payer: Managed Care, Other (non HMO)

## 2014-08-23 VITALS — BP 135/55 | HR 65 | Temp 98.4°F | Resp 18 | Ht 65.0 in | Wt 199.9 lb

## 2014-08-23 DIAGNOSIS — C7951 Secondary malignant neoplasm of bone: Secondary | ICD-10-CM

## 2014-08-23 DIAGNOSIS — D630 Anemia in neoplastic disease: Secondary | ICD-10-CM

## 2014-08-23 DIAGNOSIS — C50412 Malignant neoplasm of upper-outer quadrant of left female breast: Secondary | ICD-10-CM

## 2014-08-23 DIAGNOSIS — Z5111 Encounter for antineoplastic chemotherapy: Secondary | ICD-10-CM

## 2014-08-23 DIAGNOSIS — C773 Secondary and unspecified malignant neoplasm of axilla and upper limb lymph nodes: Secondary | ICD-10-CM

## 2014-08-23 DIAGNOSIS — D6481 Anemia due to antineoplastic chemotherapy: Secondary | ICD-10-CM

## 2014-08-23 DIAGNOSIS — T451X5A Adverse effect of antineoplastic and immunosuppressive drugs, initial encounter: Secondary | ICD-10-CM

## 2014-08-23 LAB — COMPREHENSIVE METABOLIC PANEL (CC13)
ALT: 20 U/L (ref 0–55)
ANION GAP: 6 meq/L (ref 3–11)
AST: 16 U/L (ref 5–34)
Albumin: 3.5 g/dL (ref 3.5–5.0)
Alkaline Phosphatase: 76 U/L (ref 40–150)
BUN: 10.6 mg/dL (ref 7.0–26.0)
CO2: 28 mEq/L (ref 22–29)
Calcium: 10 mg/dL (ref 8.4–10.4)
Chloride: 108 mEq/L (ref 98–109)
Creatinine: 0.6 mg/dL (ref 0.6–1.1)
GLUCOSE: 103 mg/dL (ref 70–140)
Potassium: 3.7 mEq/L (ref 3.5–5.1)
SODIUM: 142 meq/L (ref 136–145)
Total Bilirubin: <0.2 mg/dL (ref 0.20–1.20)
Total Protein: 6.5 g/dL (ref 6.4–8.3)

## 2014-08-23 LAB — CBC WITH DIFFERENTIAL/PLATELET
BASO%: 0.2 % (ref 0.0–2.0)
BASOS ABS: 0 10*3/uL (ref 0.0–0.1)
EOS%: 0.1 % (ref 0.0–7.0)
Eosinophils Absolute: 0 10*3/uL (ref 0.0–0.5)
HCT: 29.8 % — ABNORMAL LOW (ref 34.8–46.6)
HGB: 9.8 g/dL — ABNORMAL LOW (ref 11.6–15.9)
LYMPH%: 12.9 % — ABNORMAL LOW (ref 14.0–49.7)
MCH: 28.9 pg (ref 25.1–34.0)
MCHC: 32.9 g/dL (ref 31.5–36.0)
MCV: 87.9 fL (ref 79.5–101.0)
MONO#: 1 10*3/uL — ABNORMAL HIGH (ref 0.1–0.9)
MONO%: 10.6 % (ref 0.0–14.0)
NEUT#: 7 10*3/uL — ABNORMAL HIGH (ref 1.5–6.5)
NEUT%: 76.2 % (ref 38.4–76.8)
PLATELETS: 231 10*3/uL (ref 145–400)
RBC: 3.39 10*6/uL — AB (ref 3.70–5.45)
RDW: 14.4 % (ref 11.2–14.5)
WBC: 9.2 10*3/uL (ref 3.9–10.3)
lymph#: 1.2 10*3/uL (ref 0.9–3.3)

## 2014-08-23 MED ORDER — DEXAMETHASONE SODIUM PHOSPHATE 20 MG/5ML IJ SOLN
INTRAMUSCULAR | Status: AC
  Filled 2014-08-23: qty 5

## 2014-08-23 MED ORDER — SODIUM CHLORIDE 0.9 % IJ SOLN
10.0000 mL | INTRAMUSCULAR | Status: DC | PRN
  Administered 2014-08-23: 10 mL
  Filled 2014-08-23: qty 10

## 2014-08-23 MED ORDER — FOSAPREPITANT DIMEGLUMINE INJECTION 150 MG
150.0000 mg | Freq: Once | INTRAVENOUS | Status: AC
  Administered 2014-08-23: 150 mg via INTRAVENOUS
  Filled 2014-08-23: qty 5

## 2014-08-23 MED ORDER — SODIUM CHLORIDE 0.9 % IV SOLN
Freq: Once | INTRAVENOUS | Status: AC
  Administered 2014-08-23: 10:00:00 via INTRAVENOUS

## 2014-08-23 MED ORDER — PALONOSETRON HCL INJECTION 0.25 MG/5ML
0.2500 mg | Freq: Once | INTRAVENOUS | Status: AC
Start: 2014-08-23 — End: 2014-08-23
  Administered 2014-08-23: 0.25 mg via INTRAVENOUS

## 2014-08-23 MED ORDER — DOXORUBICIN HCL CHEMO IV INJECTION 2 MG/ML
60.0000 mg/m2 | Freq: Once | INTRAVENOUS | Status: AC
  Administered 2014-08-23: 122 mg via INTRAVENOUS
  Filled 2014-08-23: qty 61

## 2014-08-23 MED ORDER — SODIUM CHLORIDE 0.9 % IV SOLN
600.0000 mg/m2 | Freq: Once | INTRAVENOUS | Status: AC
  Administered 2014-08-23: 1220 mg via INTRAVENOUS
  Filled 2014-08-23: qty 61

## 2014-08-23 MED ORDER — PALONOSETRON HCL INJECTION 0.25 MG/5ML
INTRAVENOUS | Status: AC
  Filled 2014-08-23: qty 5

## 2014-08-23 MED ORDER — DEXAMETHASONE SODIUM PHOSPHATE 20 MG/5ML IJ SOLN
12.0000 mg | Freq: Once | INTRAMUSCULAR | Status: AC
  Administered 2014-08-23: 12 mg via INTRAVENOUS

## 2014-08-23 MED ORDER — HEPARIN SOD (PORK) LOCK FLUSH 100 UNIT/ML IV SOLN
500.0000 [IU] | Freq: Once | INTRAVENOUS | Status: AC | PRN
  Administered 2014-08-23: 500 [IU]
  Filled 2014-08-23: qty 5

## 2014-08-23 NOTE — Telephone Encounter (Signed)
, °

## 2014-08-23 NOTE — Progress Notes (Signed)
Swift blood return noted before, during and after Adriamycin. 

## 2014-08-23 NOTE — Assessment & Plan Note (Signed)
Metastatic breast cancer in the left breast involving left axillary lymph nodes and isolated L1 vertebral metastases stage IV disease, ER percent, PR 4%, HER-2 negative ratio 1.3 Ki-67 15%  Today is cycle 3 of dose since Adriamycin Cytoxan. Overall she's tolerating chemotherapy extremely well she has had some constipation for which she is taking Colace. She has good appetite good energy levels. The breast itself is getting markedly smaller in terms of the tumor. So we do not need to obtain an MRI after 4 cycles of dose dense Adriamycin Cytoxan. After this was complete, she will go on weekly Taxol x12. I reviewed her blood work. She will come back in 2 weeks for cycle #4.

## 2014-08-23 NOTE — Patient Instructions (Signed)
[facility] Discharge Instructions for Patients Receiving Chemotherapy  Today you received the following chemotherapy agents Adriamycin and Cytoxan.  To help prevent nausea and vomiting after your treatment, we encourage you to take your nausea medication as prescribed.   If you develop nausea and vomiting that is not controlled by your nausea medication, call the clinic.   BELOW ARE SYMPTOMS THAT SHOULD BE REPORTED IMMEDIATELY:  *FEVER GREATER THAN 100.5 F  *CHILLS WITH OR WITHOUT FEVER  NAUSEA AND VOMITING THAT IS NOT CONTROLLED WITH YOUR NAUSEA MEDICATION  *UNUSUAL SHORTNESS OF BREATH  *UNUSUAL BRUISING OR BLEEDING  TENDERNESS IN MOUTH AND THROAT WITH OR WITHOUT PRESENCE OF ULCERS  *URINARY PROBLEMS  *BOWEL PROBLEMS  UNUSUAL RASH Items with * indicate a potential emergency and should be followed up as soon as possible.  Feel free to call the clinic you have any questions or concerns. The clinic phone number is [phone].    

## 2014-08-23 NOTE — Assessment & Plan Note (Signed)
Chemotherapy-induced anemia: Her hemoglobin is slowly coming down. She is still not symptomatic so we'll continue to watch and monitor this. I suspect after the Adriamycin Cytoxan chemotherapy, it will slowly improve on Taxol treatments.

## 2014-08-23 NOTE — Progress Notes (Signed)
Patient Care Team: Rachell Cipro, MD as PCP - General (Family Medicine) [facility]  Rulon Eisenmenger, MD as Consulting Physician (Hematology and Oncology) [name], MD as Consulting Physician (General Surgery) Eppie Gibson, MD as Attending Physician (Radiation Oncology)  DIAGNOSIS: Breast cancer of upper-outer quadrant of left female breast   Primary site: Breast (Left)   Staging method: AJCC 7th Edition   Clinical: Stage IV (T2, N1, M1) signed by Rulon Eisenmenger, MD on 08/16/2014  7:34 AM   Summary: Stage IV (T2, N1, M1)   Clinical comments: Staged at breast conference 06/06/14.   SUMMARY OF ONCOLOGIC HISTORY:   Breast cancer of upper-outer quadrant of left female breast   05/18/2014 Mammogram Suspicious left upper outer quadrant 5 cm segmental area of calcifications.    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   05/30/2014 Breast MRI Left Breast: 12oclock: 21 x 23 x 30 mm, Numerous  level 1 and 2 LN; Liver lesions cycts by Liver MRI   06/07/2014 Initial Biopsy Left axillary lymph node biopsy invasive ductal carcinoma ER 100%, PR 11% Ki-67 15% HER-2 negative ratio 1.41   06/13/2014 PET scan and metabolic she were left breast, hypermetabolic left axillary and left retropectoral lymph node, small lucent lesion in L1 vertebra which was biopsied and proven to be bone metastases   07/19/2014 Initial Biopsy Biopsy of L1 vertebra metastatic carcinoma breast primary, ER 100%, PR 4%, HER-2 negative ratio 1.3   07/26/2014 -  Neo-Adjuvant Chemotherapy Even though she has L1 solitary bone metastases, patient is being treated definitively with neoadjuvant chemotherapy with dose dense Adriamycin and Cytoxan to be followed by weekly Taxol x12    CHIEF COMPLIANT: Cycle 3 dose dense Adriamycin Cytoxan today  INTERVAL HISTORY: Kimberly Reed is a 58 year old Caucasian with above-mentioned history of breast  cancer with solitary bone metastases currently being treated with definite of chemotherapy neoadjuvant A. She is here for cycle 3 of dose dense Adriamycin and Cytoxan. She is tolerating chemotherapy extremely well. After chemotherapy she has 3-4 days of severe fatigue. Subsequently she improved and she is able to function normally. She has alopecia related to chemotherapy. She had constipation as well which is improved now with Colace. She denies any nausea vomiting. His excellent appetite.  REVIEW OF SYSTEMS:   Constitutional: Denies fevers, chills or abnormal weight loss Eyes: Denies blurriness of vision Ears, nose, mouth, throat, and face: Denies mucositis or sore throat Respiratory: Denies cough, dyspnea or wheezes Cardiovascular: Denies palpitation, chest discomfort or lower extremity swelling Gastrointestinal:  Denies nausea, heartburn or change in bowel habits Skin: Denies abnormal skin rashes Lymphatics: Denies new lymphadenopathy or easy bruising Neurological:Denies numbness, tingling or new weaknesses Behavioral/Psych: Mood is stable, no new changes  Breast: The tumor in the breast is getting smaller. All other systems were reviewed with the patient and are negative.  I have reviewed the past medical history, past surgical history, social history and family history with the patient and they are unchanged from previous note.  ALLERGIES:  is allergic to other and effexor.  MEDICATIONS:  Current Outpatient Prescriptions  Medication Sig Dispense Refill  . acidophilus (RISAQUAD) CAPS capsule Take 1 capsule by mouth daily.      [name] ALPRAZolam (XANAX) 0.5 MG tablet Take 1 tablet (0.5 mg total) by mouth 3 (three) times daily as needed for anxiety.  60 tablet  3  . ARIPiprazole (ABILIFY) 10 MG tablet Take 5 mg by mouth  every morning.       . ARIPiprazole (ABILIFY) 2 MG tablet Take 1 tablet (2 mg total) by mouth daily.  90 tablet  3  . brimonidine-timolol (COMBIGAN) 0.2-0.5 % ophthalmic  solution Place 1 drop into the right eye every 12 (twelve) hours.       . Cholecalciferol (VITAMIN D-3) 5000 UNITS TABS Take 1 tablet by mouth daily.      [name] dexamethasone (DECADRON) 4 MG tablet Take 2 tablets by mouth once a day on the day after chemotherapy and then take 2 tablets two times a day for 2 days. Take with food.  30 tablet  1  . dorzolamide (TRUSOPT) 2 % ophthalmic solution Place 1 drop into the right eye 2 (two) times daily.       . Flaxseed, Linseed, (FLAX SEED OIL PO) Take 1 capsule by mouth daily.      [name] HYDROcodone-acetaminophen (NORCO) 5-325 MG per tablet Take 1-2 tablets by mouth every 6 (six) hours as needed.  30 tablet  0  . lidocaine-prilocaine (EMLA) cream Apply 1 application topically as needed.  30 g  0  . LORazepam (ATIVAN) 0.5 MG tablet Take 1 tablet (0.5 mg total) by mouth every 6 (six) hours as needed (Nausea or vomiting).  30 tablet  0  . LORazepam (ATIVAN) 1 MG tablet Take 1 tablet (1 mg total) by mouth 2 (two) times daily as needed for anxiety.  60 tablet  0  . Omega-3 Fatty Acids (OMEGA 3 PO) Take 1 capsule by mouth daily.      . ondansetron (ZOFRAN) 8 MG tablet Take 1 tablet (8 mg total) by mouth 2 (two) times daily as needed. Start on the third day after chemotherapy.  30 tablet  1  . OVER THE COUNTER MEDICATION Take 1 capsule by mouth daily.      . prochlorperazine (COMPAZINE) 10 MG tablet Take 1 tablet (10 mg total) by mouth every 6 (six) hours as needed (Nausea or vomiting).  30 tablet  1  . thyroid (ARMOUR THYROID) 180 MG tablet Take 1 tablet (180 mg total) by mouth daily before breakfast.  90 tablet  2  . Travoprost, BAK Free, (TRAVATAN) 0.004 % SOLN ophthalmic solution Place 1 drop into the right eye at bedtime.       . WHEY PROTEIN PO Take 1 scoop by mouth daily.       No current facility-administered medications for this visit.    PHYSICAL EXAMINATION: ECOG PERFORMANCE STATUS: 1 - Symptomatic but completely ambulatory  Filed Vitals:   08/23/14 0858   BP: 135/55  Pulse: 65  Temp: 98.4 F (36.9 C)  Resp: 18   Filed Weights   08/23/14 0858  Weight: 199 lb 14.4 oz (90.674 kg)    GENERAL:alert, no distress and comfortable SKIN: skin color, texture, turgor are normal, no rashes or significant lesions EYES: normal, Conjunctiva are pink and non-injected, sclera clear OROPHARYNX:no exudate, no erythema and lips, buccal mucosa, and tongue normal  NECK: supple, thyroid normal size, non-tender, without nodularity LYMPH:  no palpable lymphadenopathy in the cervical, axillary or inguinal LUNGS: clear to auscultation and percussion with normal breathing effort HEART: regular rate & rhythm and no murmurs and no lower extremity edema ABDOMEN:abdomen soft, non-tender and normal bowel sounds Musculoskeletal:no cyanosis of digits and no clubbing  NEURO: alert & oriented x 3 with fluent speech, no focal motor/sensory deficits  LABORATORY DATA:  I have reviewed the data as listed   Chemistry  Component Value Date/Time   NA 142 08/23/2014 0840   NA 141 07/19/2014 0959   K 3.7 08/23/2014 0840   K 4.1 07/19/2014 0959   CL 105 07/19/2014 0959   CO2 28 08/23/2014 0840   CO2 25 07/19/2014 0959   BUN 10.6 08/23/2014 0840   BUN 20 07/19/2014 0959   CREATININE 0.6 08/23/2014 0840   CREATININE 0.61 07/19/2014 0959   CREATININE 0.73 03/05/2014 1044      Component Value Date/Time   CALCIUM 10.0 08/23/2014 0840   CALCIUM 9.9 07/19/2014 0959   ALKPHOS 76 08/23/2014 0840   ALKPHOS 53 06/06/2014 1229   AST 16 08/23/2014 0840   AST 14 06/06/2014 1229   ALT 20 08/23/2014 0840   ALT 12 06/06/2014 1229   BILITOT <0.20 08/23/2014 0840   BILITOT 0.3 06/06/2014 1229       Lab Results  Component Value Date   WBC 9.2 08/23/2014   HGB 9.8* 08/23/2014   HCT 29.8* 08/23/2014   MCV 87.9 08/23/2014   PLT 231 08/23/2014   NEUTROABS 7.0* 08/23/2014     RADIOGRAPHIC STUDIES: I have personally reviewed the radiology reports and agreed with their  findings. No results found.   ASSESSMENT & PLAN:  Breast cancer of upper-outer quadrant of left female breast Metastatic breast cancer in the left breast involving left axillary lymph nodes and isolated L1 vertebral metastases stage IV disease, ER percent, PR 4%, HER-2 negative ratio 1.3 Ki-67 15%  Today is cycle 3 of dose since Adriamycin Cytoxan. Overall she's tolerating chemotherapy extremely well she has had some constipation for which she is taking Colace. She has good appetite good energy levels. The breast itself is getting markedly smaller in terms of the tumor. So we do not need to obtain an MRI after 4 cycles of dose dense Adriamycin Cytoxan. After this was complete, she will go on weekly Taxol x12. I reviewed her blood work. She will come back in 2 weeks for cycle #4.    Anemia due to antineoplastic chemotherapy Chemotherapy-induced anemia: Her hemoglobin is slowly coming down. She is still not symptomatic so we'll continue to watch and monitor this. I suspect after the Adriamycin Cytoxan chemotherapy, it will slowly improve on Taxol treatments.   Orders Placed This Encounter  Procedures  . CBC with Differential    Standing Status: Future     Number of Occurrences:      Standing Expiration Date: 08/23/2015  . Comprehensive metabolic panel (Cmet) - [facility]    Standing Status: Future     Number of Occurrences:      Standing Expiration Date: 08/23/2015   The patient has a good understanding of the overall plan. she agrees with it. She will call with any problems that may develop before her next visit here.  I spent 20 minutes counseling the patient face to face. The total time spent in the appointment was 25 minutes and more than 50% was on counseling and review of test results    Rulon Eisenmenger, MD 08/23/2014 9:37 AM

## 2014-08-24 ENCOUNTER — Ambulatory Visit (HOSPITAL_BASED_OUTPATIENT_CLINIC_OR_DEPARTMENT_OTHER): Payer: Managed Care, Other (non HMO)

## 2014-08-24 VITALS — BP 105/63 | HR 57 | Temp 96.6°F | Resp 18

## 2014-08-24 DIAGNOSIS — C50412 Malignant neoplasm of upper-outer quadrant of left female breast: Secondary | ICD-10-CM

## 2014-08-24 DIAGNOSIS — Z5189 Encounter for other specified aftercare: Secondary | ICD-10-CM

## 2014-08-24 MED ORDER — PEGFILGRASTIM INJECTION 6 MG/0.6ML
6.0000 mg | Freq: Once | SUBCUTANEOUS | Status: AC
  Administered 2014-08-24: 6 mg via SUBCUTANEOUS
  Filled 2014-08-24: qty 0.6

## 2014-08-27 ENCOUNTER — Other Ambulatory Visit: Payer: Self-pay | Admitting: *Deleted

## 2014-08-30 ENCOUNTER — Ambulatory Visit (HOSPITAL_BASED_OUTPATIENT_CLINIC_OR_DEPARTMENT_OTHER): Payer: Managed Care, Other (non HMO) | Admitting: Adult Health

## 2014-08-30 ENCOUNTER — Other Ambulatory Visit (HOSPITAL_BASED_OUTPATIENT_CLINIC_OR_DEPARTMENT_OTHER): Payer: Managed Care, Other (non HMO)

## 2014-08-30 ENCOUNTER — Encounter: Payer: Self-pay | Admitting: Adult Health

## 2014-08-30 VITALS — BP 101/59 | HR 18 | Temp 98.2°F | Resp 18 | Ht 65.0 in | Wt 194.7 lb

## 2014-08-30 DIAGNOSIS — C50412 Malignant neoplasm of upper-outer quadrant of left female breast: Secondary | ICD-10-CM

## 2014-08-30 DIAGNOSIS — D709 Neutropenia, unspecified: Secondary | ICD-10-CM

## 2014-08-30 DIAGNOSIS — I959 Hypotension, unspecified: Secondary | ICD-10-CM

## 2014-08-30 DIAGNOSIS — Z7951 Long term (current) use of inhaled steroids: Secondary | ICD-10-CM

## 2014-08-30 LAB — COMPREHENSIVE METABOLIC PANEL (CC13)
ALT: 14 U/L (ref 0–55)
ANION GAP: 7 meq/L (ref 3–11)
AST: 12 U/L (ref 5–34)
Albumin: 3.4 g/dL — ABNORMAL LOW (ref 3.5–5.0)
Alkaline Phosphatase: 89 U/L (ref 40–150)
BUN: 12.1 mg/dL (ref 7.0–26.0)
CO2: 26 meq/L (ref 22–29)
CREATININE: 0.6 mg/dL (ref 0.6–1.1)
Calcium: 9.7 mg/dL (ref 8.4–10.4)
Chloride: 104 mEq/L (ref 98–109)
Glucose: 119 mg/dl (ref 70–140)
Potassium: 3.8 mEq/L (ref 3.5–5.1)
SODIUM: 137 meq/L (ref 136–145)
TOTAL PROTEIN: 6.5 g/dL (ref 6.4–8.3)
Total Bilirubin: 0.27 mg/dL (ref 0.20–1.20)

## 2014-08-30 LAB — CBC WITH DIFFERENTIAL/PLATELET
BASO%: 2.2 % — AB (ref 0.0–2.0)
Basophils Absolute: 0 10*3/uL (ref 0.0–0.1)
EOS ABS: 0 10*3/uL (ref 0.0–0.5)
EOS%: 0 % (ref 0.0–7.0)
HCT: 28.5 % — ABNORMAL LOW (ref 34.8–46.6)
HGB: 9.5 g/dL — ABNORMAL LOW (ref 11.6–15.9)
LYMPH%: 58.2 % — ABNORMAL HIGH (ref 14.0–49.7)
MCH: 29 pg (ref 25.1–34.0)
MCHC: 33.3 g/dL (ref 31.5–36.0)
MCV: 86.9 fL (ref 79.5–101.0)
MONO#: 0.1 10*3/uL (ref 0.1–0.9)
MONO%: 8.8 % (ref 0.0–14.0)
NEUT#: 0.3 10*3/uL — CL (ref 1.5–6.5)
NEUT%: 30.8 % — ABNORMAL LOW (ref 38.4–76.8)
NRBC: 0 % (ref 0–0)
Platelets: 260 10*3/uL (ref 145–400)
RBC: 3.28 10*6/uL — AB (ref 3.70–5.45)
RDW: 14.3 % (ref 11.2–14.5)
WBC: 0.9 10*3/uL — AB (ref 3.9–10.3)
lymph#: 0.5 10*3/uL — ABNORMAL LOW (ref 0.9–3.3)

## 2014-08-30 NOTE — Patient Instructions (Signed)
  Patient Neutropenia Instruction Sheet  Diagnosis: Breast Cancer      Treating Physician: Dr. Lindi Adie  Treatment: 1. Type of chemotherapy: Adriamycin and Cytoxan 2. Date of last treatment: 08/24/14  Last Blood Counts: Lab Results  Component Value Date   WBC 0.9* 08/30/2014   HGB 9.5* 08/30/2014   HCT 28.5* 08/30/2014   MCV 86.9 08/30/2014   PLT 260 08/30/2014  ANC 300     Instructions: 1. Monitor temperature and call if fever  greater than 100.5, chills, shaking chills (rigors) 2. Call Physician on-call at [phone] 3. Give him/her symptoms and list of medications that you are taking and your last blood count.

## 2014-08-30 NOTE — Progress Notes (Signed)
Patient Care Team: Rachell Cipro, MD as PCP - General (Family Medicine) [facility]  Rulon Eisenmenger, MD as Consulting Physician (Hematology and Oncology) [name], MD as Consulting Physician (General Surgery) Eppie Gibson, MD as Attending Physician (Radiation Oncology)  DIAGNOSIS: Breast cancer of upper-outer quadrant of left female breast   Primary site: Breast (Left)   Staging method: AJCC 7th Edition   Clinical: Stage IV (T2, N1, M1) signed by Rulon Eisenmenger, MD on 08/16/2014  7:34 AM   Summary: Stage IV (T2, N1, M1)   Clinical comments: Staged at breast conference 06/06/14.   SUMMARY OF ONCOLOGIC HISTORY:   Breast cancer of upper-outer quadrant of left female breast   05/18/2014 Mammogram Suspicious left upper outer quadrant 5 cm segmental area of calcifications.    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   05/30/2014 Breast MRI Left Breast: 12oclock: 21 x 23 x 30 mm, Numerous  level 1 and 2 LN; Liver lesions cycts by Liver MRI   06/07/2014 Initial Biopsy Left axillary lymph node biopsy invasive ductal carcinoma ER 100%, PR 11% Ki-67 15% HER-2 negative ratio 1.41   06/13/2014 PET scan and metabolic she were left breast, hypermetabolic left axillary and left retropectoral lymph node, small lucent lesion in L1 vertebra which was biopsied and proven to be bone metastases   07/19/2014 Initial Biopsy Biopsy of L1 vertebra metastatic carcinoma breast primary, ER 100%, PR 4%, HER-2 negative ratio 1.3   07/26/2014 -  Neo-Adjuvant Chemotherapy Even though she has L1 solitary bone metastases, patient is being treated definitively with neoadjuvant chemotherapy with dose dense Adriamycin and Cytoxan to be followed by weekly Taxol x12    CHIEF COMPLIANT: Cycle 3 day 8 dose dense Adriamycin Cytoxan today  INTERVAL HISTORY: Kimberly Reed is a 58 year old woman who is here for follow up after  chemotherapy.  She says that she doesn't feel very good today.  She is dizzy.  She says she doesn't think she is drinking enough per day.  She isn't sure how much she is drinking, as she hasn't been paying as close attention to it.  She denies fevers, chills, nausea, vomiting, constipation, diarrhea, chest pain, diarrhea, numbness/tingling, skin changes, or any further concerns.    REVIEW OF SYSTEMS:   A 10 point review of systems was conducted and is otherwise negative except for what is noted above.    Past Medical History  Diagnosis Date  . Hypothyroid   . Glaucoma   . Hyperlipemia   . Migraine without aura   . History of syncope 2008    loss of bladder control eval by Neuro  . Atypical chest pain   . Family history of heart disease   . Anxiety   . Depression   . Hot flashes   . Anemia   . Insomnia   . Cancer     left breast dx. 4'23- chemo planned to start 07-09-14- Dr. Lindi [facility]   Past Surgical History  Procedure Laterality Date  . Cataract extraction Left   . Glaucoma surgery Left     x1   . Examination under anesthesia  02/24/2013    Procedure: EXAM UNDER ANESTHESIA;  Surgeon: Azalia Bilis, MD;  Location: [facility];  Service: Gynecology;;  with removal of prolapsing cervical mass; pap smear and endometrial biopsy  . Uterine polyp removed      per hysteroscopy about 1 1/2 yrs ago.  . Esophagogastroduodenoscopy  06/01/14  . Tonsillectomy      child  . Portacath placement Right 07/06/2014    Procedure: PORT A CATH  PLACEMENT;  Surgeon: [name], MD;  Location: [facility];  Service: General;  Laterality: Right;   Family History  Problem Relation Age of Onset  . Aneurysm Mother   . Kidney disease Mother   . Hypertension Father   . Dementia Father   . Kidney disease Brother   . Heart disease      "all of moms side"  . Heart failure Cousin   . Depression     History   Social History  . Marital Status: Single    Spouse Name: N/A    Number of  Children: N/A  . Years of Education: N/A   Occupational History  . 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 Smoking status: Never Smoker   . Smokeless tobacco: Never Used  . Alcohol Use: No  . Drug Use: No     Comment: in 20's but not now  . Sexual Activity:    Partners: Female   Other Topics Concern  . 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     Hhof 1  2 cats    No tad    8 hours of sleep.           ALLERGIES:  is allergic to other and effexor.  MEDICATIONS:  Current Outpatient Prescriptions  Medication Sig Dispense Refill  . acidophilus (RISAQUAD) CAPS capsule Take 1 capsule by mouth daily.    [name] ALPRAZolam (XANAX) 0.5 MG tablet Take 1 tablet (0.5 mg total) by mouth 3 (three) times daily as needed for anxiety. 60 tablet 3  . ARIPiprazole (ABILIFY) 10 MG tablet Take 5 mg by mouth every morning.     . ARIPiprazole (ABILIFY) 2 MG tablet Take 1 tablet (2 mg total) by mouth daily. 90 tablet 3  . brimonidine-timolol (COMBIGAN) 0.2-0.5 % ophthalmic solution Place 1 drop into the right eye every 12 (twelve) hours.     . Cholecalciferol (VITAMIN D-3) 5000 UNITS TABS Take 1 tablet by mouth daily.    [name] dexamethasone (DECADRON) 4 MG tablet Take 2 tablets by mouth once a day on the day after chemotherapy and then take 2 tablets two times a day for 2 days. Take with food. 30 tablet 1  . dorzolamide (TRUSOPT) 2 % ophthalmic solution Place 1 drop into the right eye 2 (two) times daily.     . Flaxseed, Linseed, (FLAX SEED OIL PO) Take 1 capsule by mouth daily.    [name] HYDROcodone-acetaminophen (NORCO) 5-325 MG per tablet Take 1-2 tablets by mouth every 6 (six) hours as needed. 30 tablet 0  . lidocaine-prilocaine (EMLA) cream Apply 1 application topically as needed. 30 g 0  . LORazepam (ATIVAN) 0.5 MG tablet Take 1 tablet (0.5 mg total) by mouth every 6 (six) hours as needed (Nausea or vomiting). 30 tablet 0  .  LORazepam (ATIVAN) 1 MG tablet Take 1 tablet (1 mg total) by mouth 2 (two) times daily as needed for anxiety. 60 tablet 0  . Omega-3 Fatty Acids (OMEGA 3 PO) Take 1 capsule by mouth daily.    . ondansetron (ZOFRAN) 8 MG tablet Take 1 tablet (8 mg total) by mouth 2 (two) times daily as needed. Start on the third day after chemotherapy. 30 tablet  1  . OVER THE COUNTER MEDICATION Take 1 capsule by mouth daily.    . prochlorperazine (COMPAZINE) 10 MG tablet Take 1 tablet (10 mg total) by mouth every 6 (six) hours as needed (Nausea or vomiting). 30 tablet 1  . thyroid (ARMOUR THYROID) 180 MG tablet Take 1 tablet (180 mg total) by mouth daily before breakfast. 90 tablet 2  . Travoprost, BAK Free, (TRAVATAN) 0.004 % SOLN ophthalmic solution Place 1 drop into the right eye at bedtime.     . WHEY PROTEIN PO Take 1 scoop by mouth daily.     No current facility-administered medications for this visit.    PHYSICAL EXAMINATION: ECOG PERFORMANCE STATUS: 1 - Symptomatic but completely ambulatory  Filed Vitals:   08/30/14 1350  BP: 101/59  Pulse: 18  Temp: 98.2 F (36.8 C)  Resp: 18   Filed Weights   08/30/14 1350  Weight: 194 lb 11.2 oz (88.315 kg)  GENERAL: Patient is an un-well appearing female in no acute distress HEENT:  Sclerae anicteric.  Oropharynx clear and moist. No ulcerations or evidence of oropharyngeal candidiasis. Neck is supple.  NODES:  No cervical, supraclavicular, or axillary lymphadenopathy palpated.  BREAST EXAM:  Deferred. LUNGS:  Clear to auscultation bilaterally.  No wheezes or rhonchi. HEART:  Regular rate and rhythm. No murmur appreciated. ABDOMEN:  Soft, nontender.  Positive, normoactive bowel sounds. No organomegaly palpated. MSK:  No focal spinal tenderness to palpation. Full range of motion bilaterally in the upper extremities. EXTREMITIES:  No peripheral edema.   SKIN:  Clear with no obvious rashes or skin changes. No nail dyscrasia. NEURO:  Nonfocal. Well oriented.   Appropriate affect.    LABORATORY DATA:  I have reviewed the data as listed   Chemistry      Component Value Date/Time   NA 142 08/23/2014 0840   NA 141 07/19/2014 0959   K 3.7 08/23/2014 0840   K 4.1 07/19/2014 0959   CL 105 07/19/2014 0959   CO2 28 08/23/2014 0840   CO2 25 07/19/2014 0959   BUN 10.6 08/23/2014 0840   BUN 20 07/19/2014 0959   CREATININE 0.6 08/23/2014 0840   CREATININE 0.61 07/19/2014 0959   CREATININE 0.73 03/05/2014 1044      Component Value Date/Time   CALCIUM 10.0 08/23/2014 0840   CALCIUM 9.9 07/19/2014 0959   ALKPHOS 76 08/23/2014 0840   ALKPHOS 53 06/06/2014 1229   AST 16 08/23/2014 0840   AST 14 06/06/2014 1229   ALT 20 08/23/2014 0840   ALT 12 06/06/2014 1229   BILITOT <0.20 08/23/2014 0840   BILITOT 0.3 06/06/2014 1229       Lab Results  Component Value Date   WBC 0.9* 08/30/2014   HGB 9.5* 08/30/2014   HCT 28.5* 08/30/2014   MCV 86.9 08/30/2014   PLT 260 08/30/2014   NEUTROABS 0.3* 08/30/2014     RADIOGRAPHIC STUDIES: I have personally reviewed the radiology reports and agreed with their findings. No results found.   ASSESSMENT: 58 year old woman with stage 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 She was determined to have L1 metastatic disease on PET scan and bone biopsy.   1. She was started on neoadjuvant chemotherapy with dose dense Adriamycin and Cytoxan on 07/26/14.   PLAN:   Kimberly Reed is not feeling well today at all.  At one point during the appointment she stated, "can we hurry this up, I want to leave." She is subsequently neutropenic following chemotherapy.  I  reviewed her neutropenic instructions with her in detail.  She is dizzy and slightly hypotensive today.  I recommended that she stay and receive IV fluids today so we can help her BP increase and she refused.  She said that she will drink more water.  I warned her that hypotension can be a sign of an impending infection, and she told  me that she thought that drinking more water would help.  I recommended that she call if she developed any signs of infection, or if she continued to feel dizzy later on today.  She told me that she was driving home, I cautioned her that it was not a good idea considering her feeling dizzy, however she refused.    Kimberly Reed requested that I follow up on a nutrition consult that was supposed to have been ordered, but never was.  I offered to send her by scheduling to have that appointment made, but she declined.    Kimberly Reed will return in 1 week for labs, evaluation, and cycle 4 of Adriamycin and Cytoxan chemotherapy.     The patient has a good understanding of the overall plan. she agrees with it. She will call with any problems that may develop before her next visit here.  I spent 25 minutes counseling the patient face to face. The total time spent in the appointment was 30 minutes and more than 50% was on counseling and review of test results   Minette Headland, [place] [phone] 08/30/2014 1:55 PM

## 2014-09-04 ENCOUNTER — Other Ambulatory Visit: Payer: Self-pay | Admitting: *Deleted

## 2014-09-04 ENCOUNTER — Telehealth: Payer: Self-pay | Admitting: *Deleted

## 2014-09-04 NOTE — Telephone Encounter (Signed)
Patient called stating that she did not need to be seen in office on days of treatment. Spoke with Dr. Lindi Adie, patient to keep appt tomorrow but does not need to have labs or be seen between treatments. Sent pof to cancel 11/19 appts. Spoke with patient, she verbalized understanding.

## 2014-09-05 ENCOUNTER — Telehealth: Payer: Self-pay | Admitting: Hematology and Oncology

## 2014-09-06 ENCOUNTER — Ambulatory Visit (HOSPITAL_BASED_OUTPATIENT_CLINIC_OR_DEPARTMENT_OTHER): Payer: Managed Care, Other (non HMO) | Admitting: Hematology and Oncology

## 2014-09-06 ENCOUNTER — Other Ambulatory Visit: Payer: Self-pay | Admitting: Hematology and Oncology

## 2014-09-06 ENCOUNTER — Other Ambulatory Visit (HOSPITAL_BASED_OUTPATIENT_CLINIC_OR_DEPARTMENT_OTHER): Payer: Managed Care, Other (non HMO)

## 2014-09-06 ENCOUNTER — Ambulatory Visit: Payer: Managed Care, Other (non HMO) | Admitting: Nutrition

## 2014-09-06 ENCOUNTER — Ambulatory Visit (HOSPITAL_BASED_OUTPATIENT_CLINIC_OR_DEPARTMENT_OTHER): Payer: Managed Care, Other (non HMO)

## 2014-09-06 ENCOUNTER — Telehealth: Payer: Self-pay | Admitting: Hematology and Oncology

## 2014-09-06 VITALS — BP 119/73 | HR 78 | Temp 98.1°F | Resp 18 | Ht 65.0 in | Wt 198.3 lb

## 2014-09-06 DIAGNOSIS — Z5111 Encounter for antineoplastic chemotherapy: Secondary | ICD-10-CM

## 2014-09-06 DIAGNOSIS — C7951 Secondary malignant neoplasm of bone: Secondary | ICD-10-CM

## 2014-09-06 DIAGNOSIS — D6481 Anemia due to antineoplastic chemotherapy: Secondary | ICD-10-CM

## 2014-09-06 DIAGNOSIS — C773 Secondary and unspecified malignant neoplasm of axilla and upper limb lymph nodes: Secondary | ICD-10-CM

## 2014-09-06 DIAGNOSIS — C50412 Malignant neoplasm of upper-outer quadrant of left female breast: Secondary | ICD-10-CM

## 2014-09-06 DIAGNOSIS — Z170 Estrogen receptor positive status [ER+]: Secondary | ICD-10-CM

## 2014-09-06 DIAGNOSIS — T451X5A Adverse effect of antineoplastic and immunosuppressive drugs, initial encounter: Principal | ICD-10-CM

## 2014-09-06 LAB — COMPREHENSIVE METABOLIC PANEL (CC13)
ALT: 10 U/L (ref 0–55)
ANION GAP: 6 meq/L (ref 3–11)
AST: 13 U/L (ref 5–34)
Albumin: 3.2 g/dL — ABNORMAL LOW (ref 3.5–5.0)
Alkaline Phosphatase: 80 U/L (ref 40–150)
BUN: 10 mg/dL (ref 7.0–26.0)
CALCIUM: 9.9 mg/dL (ref 8.4–10.4)
CO2: 26 mEq/L (ref 22–29)
CREATININE: 0.7 mg/dL (ref 0.6–1.1)
Chloride: 105 mEq/L (ref 98–109)
GLUCOSE: 103 mg/dL (ref 70–140)
Potassium: 3.7 mEq/L (ref 3.5–5.1)
Sodium: 137 mEq/L (ref 136–145)
Total Protein: 6.5 g/dL (ref 6.4–8.3)

## 2014-09-06 LAB — CBC WITH DIFFERENTIAL/PLATELET
BASO%: 0.7 % (ref 0.0–2.0)
Basophils Absolute: 0.1 10*3/uL (ref 0.0–0.1)
EOS ABS: 0 10*3/uL (ref 0.0–0.5)
EOS%: 0.1 % (ref 0.0–7.0)
HEMATOCRIT: 26.9 % — AB (ref 34.8–46.6)
HEMOGLOBIN: 8.7 g/dL — AB (ref 11.6–15.9)
LYMPH#: 0.7 10*3/uL — AB (ref 0.9–3.3)
LYMPH%: 6.6 % — ABNORMAL LOW (ref 14.0–49.7)
MCH: 28.4 pg (ref 25.1–34.0)
MCHC: 32.3 g/dL (ref 31.5–36.0)
MCV: 87.8 fL (ref 79.5–101.0)
MONO#: 1.3 10*3/uL — ABNORMAL HIGH (ref 0.1–0.9)
MONO%: 11.7 % (ref 0.0–14.0)
NEUT%: 80.9 % — AB (ref 38.4–76.8)
NEUTROS ABS: 9.1 10*3/uL — AB (ref 1.5–6.5)
Platelets: 319 10*3/uL (ref 145–400)
RBC: 3.06 10*6/uL — ABNORMAL LOW (ref 3.70–5.45)
RDW: 14.2 % (ref 11.2–14.5)
WBC: 11.3 10*3/uL — AB (ref 3.9–10.3)

## 2014-09-06 MED ORDER — SODIUM CHLORIDE 0.9 % IV SOLN
150.0000 mg | Freq: Once | INTRAVENOUS | Status: AC
  Administered 2014-09-06: 150 mg via INTRAVENOUS
  Filled 2014-09-06: qty 5

## 2014-09-06 MED ORDER — PALONOSETRON HCL INJECTION 0.25 MG/5ML
0.2500 mg | Freq: Once | INTRAVENOUS | Status: AC
  Administered 2014-09-06: 0.25 mg via INTRAVENOUS

## 2014-09-06 MED ORDER — DEXAMETHASONE SODIUM PHOSPHATE 20 MG/5ML IJ SOLN
12.0000 mg | Freq: Once | INTRAMUSCULAR | Status: AC
  Administered 2014-09-06: 12 mg via INTRAVENOUS

## 2014-09-06 MED ORDER — SODIUM CHLORIDE 0.9 % IV SOLN
Freq: Once | INTRAVENOUS | Status: AC
  Administered 2014-09-06: 11:00:00 via INTRAVENOUS

## 2014-09-06 MED ORDER — SODIUM CHLORIDE 0.9 % IJ SOLN
10.0000 mL | INTRAMUSCULAR | Status: DC | PRN
  Administered 2014-09-06: 10 mL
  Filled 2014-09-06: qty 10

## 2014-09-06 MED ORDER — SODIUM CHLORIDE 0.9 % IV SOLN
600.0000 mg/m2 | Freq: Once | INTRAVENOUS | Status: AC
  Administered 2014-09-06: 1220 mg via INTRAVENOUS
  Filled 2014-09-06: qty 61

## 2014-09-06 MED ORDER — PALONOSETRON HCL INJECTION 0.25 MG/5ML
INTRAVENOUS | Status: AC
  Filled 2014-09-06: qty 5

## 2014-09-06 MED ORDER — DOXORUBICIN HCL CHEMO IV INJECTION 2 MG/ML
60.0000 mg/m2 | Freq: Once | INTRAVENOUS | Status: AC
  Administered 2014-09-06: 122 mg via INTRAVENOUS
  Filled 2014-09-06: qty 61

## 2014-09-06 MED ORDER — HEPARIN SOD (PORK) LOCK FLUSH 100 UNIT/ML IV SOLN
500.0000 [IU] | Freq: Once | INTRAVENOUS | Status: AC | PRN
  Administered 2014-09-06: 500 [IU]
  Filled 2014-09-06: qty 5

## 2014-09-06 MED ORDER — DEXAMETHASONE SODIUM PHOSPHATE 20 MG/5ML IJ SOLN
INTRAMUSCULAR | Status: AC
  Filled 2014-09-06: qty 5

## 2014-09-06 NOTE — Assessment & Plan Note (Signed)
Metastatic breast cancer in the left breast involving left axillary lymph nodes and isolated L1 vertebral metastases stage IV disease, ER percent, PR 4%, HER-2 negative ratio 1.3 Ki-67 15%  Chemotherapy toxicities: fatigue grade 2, anemia grade 2 Today is cycle 4 of dose dense Adriamycin and Cytoxan. Apart from more fatigue related to anemia, she has does not have any other toxicities. Today's physical exam revealed a day left-sided breast masses markedly smaller there has a do not think there is any benefit to doing interim MRI. She will come back in 2 weeks to start weekly Taxol treatments.  Return to clinic in 2 weeks for the start of Taxol treatments.

## 2014-09-06 NOTE — Progress Notes (Signed)
Patient Care Team: Kimberly Cipro, MD as PCP - General (Family Medicine) Kimberly Reed  Kimberly Eisenmenger, MD as Consulting Physician (Hematology and Oncology) [name], MD as Consulting Physician (General Surgery) Kimberly Gibson, MD as Attending Physician (Radiation Oncology)  DIAGNOSIS: Breast cancer of upper-outer quadrant of left female breast   Staging form: Breast, AJCC 7th Edition     Clinical: Stage IV (T2, N1, M1) - Signed by Kimberly Eisenmenger, MD on 08/16/2014       Staging comments: Staged at breast conference 06/06/14.      Pathologic: No stage assigned - Unsigned   SUMMARY OF ONCOLOGIC HISTORY:   Breast cancer of upper-outer quadrant of left female breast   05/18/2014 Mammogram Suspicious left upper outer quadrant 5 cm segmental area of calcifications.    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   05/30/2014 Breast MRI Left Breast: 12oclock: 21 x 23 x 30 mm, Numerous  level 1 and 2 LN; Liver lesions cycts by Liver MRI   06/07/2014 Initial Biopsy Left axillary lymph node biopsy invasive ductal carcinoma ER 100%, PR 11% Ki-67 15% HER-2 negative ratio 1.41   06/13/2014 PET scan and metabolic she were left breast, hypermetabolic left axillary and left retropectoral lymph node, small lucent lesion in L1 vertebra which was biopsied and proven to be bone metastases   07/19/2014 Initial Biopsy Biopsy of L1 vertebra metastatic carcinoma breast primary, ER 100%, PR 4%, HER-2 negative ratio 1.3   07/26/2014 -  Neo-Adjuvant Chemotherapy Even though she has L1 solitary bone metastases, patient is being treated definitively with neoadjuvant chemotherapy with dose dense Adriamycin and Cytoxan to be followed by weekly Taxol x12    CHIEF COMPLIANT: Patient is here for cycle 4 of dose dense Adriamycin Cytoxan  INTERVAL HISTORY: Kimberly Reed is a 58 year old Caucasian with above-mentioned history of breast  cancer with solitary bone metastases who is currently on neoadjuvant chemotherapy with Adriamycin and Cytoxan. Today is cycle #4. Apart for more fatigue, she is tolerating chemotherapy well. She denies any nausea vomiting. She denies any loss of appetite or weight.  REVIEW OF SYSTEMS:   Constitutional: Denies fevers, chills or abnormal weight loss Eyes: Denies blurriness of vision Ears, nose, mouth, throat, and face: Denies mucositis or sore throat Respiratory: Denies cough, dyspnea or wheezes Cardiovascular: Denies palpitation, chest discomfort or lower extremity swelling Gastrointestinal:  Denies nausea, heartburn or change in bowel habits Skin: Denies abnormal skin rashes Lymphatics: Denies new lymphadenopathy or easy bruising Neurological:Denies numbness, tingling or new weaknesses Behavioral/Psych: Mood is stable, no new changes  Breast:  denies any pain or lumps or nodules in either breasts All other systems were reviewed with the patient and are negative.  I have reviewed the past medical history, past surgical history, social history and family history with the patient and they are unchanged from previous note.  ALLERGIES:  is allergic to other and effexor.  MEDICATIONS:  Current Outpatient Prescriptions  Medication Sig Dispense Refill  . acidophilus (RISAQUAD) CAPS capsule Take 1 capsule by mouth daily.    [name] ALPRAZolam (XANAX) 0.5 MG tablet Take 1 tablet (0.5 mg total) by mouth 3 (three) times daily as needed for anxiety. 60 tablet 3  . ARIPiprazole (ABILIFY) 10 MG tablet Take 5 mg by mouth every morning.     . ARIPiprazole (ABILIFY) 2 MG tablet Take 1 tablet (2 mg total) by mouth daily. 90 tablet 3  . brimonidine-timolol (COMBIGAN) 0.2-0.5 %  ophthalmic solution Place 1 drop into the right eye every 12 (twelve) hours.     . Cholecalciferol (VITAMIN D-3) 5000 UNITS TABS Take 1 tablet by mouth daily.    [name] dexamethasone (DECADRON) 4 MG tablet Take 2 tablets by mouth once a day on  the day after chemotherapy and then take 2 tablets two times a day for 2 days. Take with food. 30 tablet 1  . dorzolamide (TRUSOPT) 2 % ophthalmic solution Place 1 drop into the right eye 2 (two) times daily.     . Flaxseed, Linseed, (FLAX SEED OIL PO) Take 1 capsule by mouth daily.    [name] HYDROcodone-acetaminophen (NORCO) 5-325 MG per tablet Take 1-2 tablets by mouth every 6 (six) hours as needed. 30 tablet 0  . lidocaine-prilocaine (EMLA) cream Apply 1 application topically as needed. 30 g 0  . LORazepam (ATIVAN) 0.5 MG tablet Take 1 tablet (0.5 mg total) by mouth every 6 (six) hours as needed (Nausea or vomiting). 30 tablet 0  . LORazepam (ATIVAN) 1 MG tablet Take 1 tablet (1 mg total) by mouth 2 (two) times daily as needed for anxiety. 60 tablet 0  . Omega-3 Fatty Acids (OMEGA 3 PO) Take 1 capsule by mouth daily.    . ondansetron (ZOFRAN) 8 MG tablet Take 1 tablet (8 mg total) by mouth 2 (two) times daily as needed. Start on the third day after chemotherapy. 30 tablet 1  . OVER THE COUNTER MEDICATION Take 1 capsule by mouth daily.    . prochlorperazine (COMPAZINE) 10 MG tablet Take 1 tablet (10 mg total) by mouth every 6 (six) hours as needed (Nausea or vomiting). 30 tablet 1  . thyroid (ARMOUR THYROID) 180 MG tablet Take 1 tablet (180 mg total) by mouth daily before breakfast. 90 tablet 2  . Travoprost, BAK Free, (TRAVATAN) 0.004 % SOLN ophthalmic solution Place 1 drop into the right eye at bedtime.     . WHEY PROTEIN PO Take 1 scoop by mouth daily.     No current facility-administered medications for this visit.    PHYSICAL EXAMINATION: ECOG PERFORMANCE STATUS: 1  Filed Vitals:   09/06/14 0954  BP: 119/73  Pulse: 78  Temp: 98.1 F (36.7 C)  Resp: 18   Filed Weights   09/06/14 0954  Weight: 198 lb 4.8 oz (89.948 kg)    GENERAL:alert, no distress and comfortable SKIN: skin color, texture, turgor are normal, no rashes or significant lesions EYES: normal, Conjunctiva are pink and  non-injected, sclera clear OROPHARYNX:no exudate, no erythema and lips, buccal mucosa, and tongue normal  NECK: supple, thyroid normal size, non-tender, without nodularity LYMPH:  no palpable lymphadenopathy in the cervical, axillary or inguinal LUNGS: clear to auscultation and percussion with normal breathing effort HEART: regular rate & rhythm and no murmurs and no lower extremity edema ABDOMEN:abdomen soft, non-tender and normal bowel sounds Musculoskeletal:no cyanosis of digits and no clubbing  NEURO: alert & oriented x 3 with fluent speech, no focal motor/sensory deficits BREAST:left breast mass is markedly smaller. No palpable axillary supraclavicular or infraclavicular adenopathy no breast tenderness or nipple discharge.   LABORATORY DATA:  I have reviewed the data as listed   Chemistry      Component Value Date/Time   NA 137 09/06/2014 0940   NA 141 07/19/2014 0959   K 3.7 09/06/2014 0940   K 4.1 07/19/2014 0959   CL 105 07/19/2014 0959   CO2 26 09/06/2014 0940   CO2 25 07/19/2014 0959   BUN 10.0  09/06/2014 0940   BUN 20 07/19/2014 0959   CREATININE 0.7 09/06/2014 0940   CREATININE 0.61 07/19/2014 0959   CREATININE 0.73 03/05/2014 1044      Component Value Date/Time   CALCIUM 9.9 09/06/2014 0940   CALCIUM 9.9 07/19/2014 0959   ALKPHOS 80 09/06/2014 0940   ALKPHOS 53 06/06/2014 1229   AST 13 09/06/2014 0940   AST 14 06/06/2014 1229   ALT 10 09/06/2014 0940   ALT 12 06/06/2014 1229   BILITOT <0.20 09/06/2014 0940   BILITOT 0.3 06/06/2014 1229       Lab Results  Component Value Date   WBC 11.3* 09/06/2014   HGB 8.7* 09/06/2014   HCT 26.9* 09/06/2014   MCV 87.8 09/06/2014   PLT 319 09/06/2014   NEUTROABS 9.1* 09/06/2014     ASSESSMENT & PLAN:  Breast cancer of upper-outer quadrant of left female breast Metastatic breast cancer in the left breast involving left axillary lymph nodes and isolated L1 vertebral metastases stage IV disease, ER percent, PR 4%,  HER-2 negative ratio 1.3 Ki-67 15%  Chemotherapy toxicities: fatigue grade 2, anemia grade 2 Today is cycle 4 of dose dense Adriamycin and Cytoxan. Apart from more fatigue related to anemia, she has does not have any other toxicities. Today's physical exam revealed a day left-sided breast masses markedly smaller there has a do not think there is any benefit to doing interim MRI. She will come back in 2 weeks to start weekly Taxol treatments.  Return to clinic in 2 weeks for the start of Taxol treatments.  Anemia due to antineoplastic chemotherapy Chemotherapy-induced anemia with hemoglobin declining down to 8.7. There is still no indication for blood transfusion and we'll continue to watch and monitor. I discussed with her to increase her iron-containing foods in the diet.    Orders Placed This Encounter  Procedures  . CBC with Differential    Standing Status: Future     Number of Occurrences:      Standing Expiration Date: 09/06/2015  . Comprehensive metabolic panel (Cmet) - [facility]    Standing Status: Future     Number of Occurrences:      Standing Expiration Date: 09/06/2015   The patient has a good understanding of the overall plan. she agrees with it. She will call with any problems that may develop before her next visit here.  I spent 20 minutes counseling the patient face to face. The total time spent in the appointment was 25 minutes and more than 50% was on counseling and review of test results    Kimberly Eisenmenger, MD 09/06/2014 10:46 AM

## 2014-09-06 NOTE — Progress Notes (Signed)
58 year old female diagnosed with breast cancer.  She is a patient of Dr. Lindi Adie.  Past medical history includes hypothyroidism, hyperlipidemia, anxiety, depression, and migraine.  Medications include acidophilus, Xanax, Abilify, vitamin D3, Armour Thyroid, Decadron, flaxseed, Ativan, omega-3 fatty acids, Zofran, and Compazine.  Labs include an albumin 3.4 on November 5.  Height: 65 inches. Weight: 198.3 pounds November 12. Usual body weight: 219 pounds February 2015.  250 pounds July 2014. BMI: 33.0.  Patient reports she has gluten intolerance.  She reports great fatigue associated with anemia.  She states she is too tired to cook most nights.  She is looking for information on how to incorporate more vegetables.  Nutrition diagnosis: Food and nutrition related knowledge deficit related to diagnosis of breast cancer and associated treatments as evidenced by no prior need for nutrition related information.  Intervention: Educated patient on strategies for increasing protein to promote weight maintenance. Fact sheet was provided. Educated patient on foods containing increased iron and ways to enhance absorption.  Fact sheet was provided. Educated patient on gluten containing foods. Educated patient on strategies for having ready to eat foods available which are healthy and nutritious.  Questions were answered.  Teach back method used.  Contact information was given.  Monitoring, evaluation, goals: Patient will tolerate adequate calories and protein to promote maintenance of lean body mass.  Next visit: No follow up required.  Patient has my contact information.  **Disclaimer: This note was dictated with voice recognition software. Similar sounding words can inadvertently be transcribed and this note may contain transcription errors which may not have been corrected upon publication of note.**

## 2014-09-06 NOTE — Assessment & Plan Note (Signed)
Chemotherapy-induced anemia with hemoglobin declining down to 8.7. There is still no indication for blood transfusion and we'll continue to watch and monitor. I discussed with her to increase her iron-containing foods in the diet.

## 2014-09-07 ENCOUNTER — Telehealth: Payer: Self-pay | Admitting: Hematology and Oncology

## 2014-09-07 ENCOUNTER — Ambulatory Visit (HOSPITAL_BASED_OUTPATIENT_CLINIC_OR_DEPARTMENT_OTHER): Payer: Managed Care, Other (non HMO)

## 2014-09-07 ENCOUNTER — Other Ambulatory Visit: Payer: Self-pay | Admitting: Hematology and Oncology

## 2014-09-07 DIAGNOSIS — C773 Secondary and unspecified malignant neoplasm of axilla and upper limb lymph nodes: Secondary | ICD-10-CM

## 2014-09-07 DIAGNOSIS — C50412 Malignant neoplasm of upper-outer quadrant of left female breast: Secondary | ICD-10-CM

## 2014-09-07 DIAGNOSIS — Z5189 Encounter for other specified aftercare: Secondary | ICD-10-CM

## 2014-09-07 DIAGNOSIS — C7951 Secondary malignant neoplasm of bone: Secondary | ICD-10-CM

## 2014-09-07 MED ORDER — PEGFILGRASTIM INJECTION 6 MG/0.6ML ~~LOC~~
6.0000 mg | PREFILLED_SYRINGE | Freq: Once | SUBCUTANEOUS | Status: AC
  Administered 2014-09-07: 6 mg via SUBCUTANEOUS
  Filled 2014-09-07: qty 0.6

## 2014-09-07 NOTE — Patient Instructions (Signed)
Pegfilgrastim injection What is this medicine? PEGFILGRASTIM (peg fil GRA stim) is a long-acting granulocyte colony-stimulating factor that stimulates the growth of neutrophils, a type of white blood cell important in the body's fight against infection. It is used to reduce the incidence of fever and infection in patients with certain types of cancer who are receiving chemotherapy that affects the bone marrow. This medicine may be used for other purposes; ask your health care provider or pharmacist if you have questions. COMMON BRAND NAME(S): Neulasta What should I tell my health care provider before I take this medicine? They need to know if you have any of these conditions: -latex allergy -ongoing radiation therapy -sickle cell disease -skin reactions to acrylic adhesives (On-Body Injector only) -an unusual or allergic reaction to pegfilgrastim, filgrastim, other medicines, foods, dyes, or preservatives -pregnant or trying to get pregnant -breast-feeding How should I use this medicine? This medicine is for injection under the skin. If you get this medicine at home, you will be taught how to prepare and give the pre-filled syringe or how to use the On-body Injector. Refer to the patient Instructions for Use for detailed instructions. Use exactly as directed. Take your medicine at regular intervals. Do not take your medicine more often than directed. It is important that you put your used needles and syringes in a special sharps container. Do not put them in a trash can. If you do not have a sharps container, call your pharmacist or healthcare provider to get one. Talk to your pediatrician regarding the use of this medicine in children. Special care may be needed. Overdosage: If you think you have taken too much of this medicine contact a poison control center or emergency room at once. NOTE: This medicine is only for you. Do not share this medicine with others. What if I miss a dose? It is  important not to miss your dose. Call your doctor or health care professional if you miss your dose. If you miss a dose due to an On-body Injector failure or leakage, a new dose should be administered as soon as possible using a single prefilled syringe for manual use. What may interact with this medicine? Interactions have not been studied. Give your health care provider a list of all the medicines, herbs, non-prescription drugs, or dietary supplements you use. Also tell them if you smoke, drink alcohol, or use illegal drugs. Some items may interact with your medicine. This list may not describe all possible interactions. Give your health care provider a list of all the medicines, herbs, non-prescription drugs, or dietary supplements you use. Also tell them if you smoke, drink alcohol, or use illegal drugs. Some items may interact with your medicine. What should I watch for while using this medicine? You may need blood work done while you are taking this medicine. If you are going to need a MRI, CT scan, or other procedure, tell your doctor that you are using this medicine (On-Body Injector only). What side effects may I notice from receiving this medicine? Side effects that you should report to your doctor or health care professional as soon as possible: -allergic reactions like skin rash, itching or hives, swelling of the face, lips, or tongue -dizziness -fever -pain, redness, or irritation at site where injected -pinpoint red spots on the skin -shortness of breath or breathing problems -stomach or side pain, or pain at the shoulder -swelling -tiredness -trouble passing urine Side effects that usually do not require medical attention (report to your doctor   or health care professional if they continue or are bothersome): -bone pain -muscle pain This list may not describe all possible side effects. Call your doctor for medical advice about side effects. You may report side effects to FDA at  1-800-FDA-1088. Where should I keep my medicine? Keep out of the reach of children. Store pre-filled syringes in a refrigerator between 2 and 8 degrees C (36 and 46 degrees F). Do not freeze. Keep in carton to protect from light. Throw away this medicine if it is left out of the refrigerator for more than 48 hours. Throw away any unused medicine after the expiration date. NOTE: This sheet is a summary. It may not cover all possible information. If you have questions about this medicine, talk to your doctor, pharmacist, or health care provider.  2015, [employer]. (2014-01-11 16:14:05)  

## 2014-09-07 NOTE — Telephone Encounter (Signed)
s.w. Anabeth and she stated pt did not need 11.25 appt...cx .Marland KitchenMarland Kitchenpt will be called with future appts....pt is aware

## 2014-09-12 ENCOUNTER — Encounter: Payer: Self-pay | Admitting: Hematology and Oncology

## 2014-09-12 NOTE — Progress Notes (Signed)
Advised the patient can pay the difference of 162 and will send note to advise. I advised her can pay the bal of grant [phone] with that letter.

## 2014-09-13 ENCOUNTER — Other Ambulatory Visit: Payer: Managed Care, Other (non HMO)

## 2014-09-13 ENCOUNTER — Ambulatory Visit: Payer: Managed Care, Other (non HMO) | Admitting: Adult Health

## 2014-09-13 ENCOUNTER — Telehealth: Payer: Self-pay | Admitting: *Deleted

## 2014-09-13 NOTE — Telephone Encounter (Signed)
Patient called and left message regarding her appts. I have forwarded the message to the desk RN

## 2014-09-14 ENCOUNTER — Other Ambulatory Visit: Payer: Self-pay | Admitting: Hematology and Oncology

## 2014-09-14 ENCOUNTER — Other Ambulatory Visit: Payer: Self-pay | Admitting: *Deleted

## 2014-09-14 ENCOUNTER — Telehealth: Payer: Self-pay | Admitting: Hematology and Oncology

## 2014-09-14 NOTE — Telephone Encounter (Signed)
°  s.w. pt and advised on NOV appt....pt ok and ware....sed added tx.

## 2014-09-17 ENCOUNTER — Other Ambulatory Visit: Payer: Self-pay | Admitting: Hematology and Oncology

## 2014-09-18 ENCOUNTER — Telehealth: Payer: Self-pay | Admitting: Hematology and Oncology

## 2014-09-18 ENCOUNTER — Telehealth: Payer: Self-pay

## 2014-09-18 ENCOUNTER — Other Ambulatory Visit: Payer: Self-pay | Admitting: Hematology and Oncology

## 2014-09-18 NOTE — Telephone Encounter (Signed)
Returned pt call re: appt 11/27.  Confirmed with patient that she will be seeing Aw[facility].  Pt voiced understanding.

## 2014-09-18 NOTE — Telephone Encounter (Signed)
, °

## 2014-09-19 ENCOUNTER — Ambulatory Visit: Payer: Managed Care, Other (non HMO) | Admitting: Hematology and Oncology

## 2014-09-19 ENCOUNTER — Other Ambulatory Visit: Payer: Managed Care, Other (non HMO)

## 2014-09-21 ENCOUNTER — Other Ambulatory Visit (HOSPITAL_BASED_OUTPATIENT_CLINIC_OR_DEPARTMENT_OTHER): Payer: Managed Care, Other (non HMO)

## 2014-09-21 ENCOUNTER — Encounter: Payer: Self-pay | Admitting: Physician Assistant

## 2014-09-21 ENCOUNTER — Ambulatory Visit (HOSPITAL_BASED_OUTPATIENT_CLINIC_OR_DEPARTMENT_OTHER): Payer: Managed Care, Other (non HMO)

## 2014-09-21 ENCOUNTER — Telehealth: Payer: Self-pay | Admitting: *Deleted

## 2014-09-21 ENCOUNTER — Other Ambulatory Visit: Payer: Managed Care, Other (non HMO)

## 2014-09-21 ENCOUNTER — Ambulatory Visit (HOSPITAL_BASED_OUTPATIENT_CLINIC_OR_DEPARTMENT_OTHER): Payer: Managed Care, Other (non HMO) | Admitting: Physician Assistant

## 2014-09-21 ENCOUNTER — Telehealth: Payer: Self-pay | Admitting: Physician Assistant

## 2014-09-21 VITALS — BP 106/50 | HR 94 | Temp 98.5°F | Resp 18 | Ht 65.0 in | Wt 195.2 lb

## 2014-09-21 DIAGNOSIS — C50412 Malignant neoplasm of upper-outer quadrant of left female breast: Secondary | ICD-10-CM

## 2014-09-21 DIAGNOSIS — C7951 Secondary malignant neoplasm of bone: Secondary | ICD-10-CM

## 2014-09-21 DIAGNOSIS — Z5111 Encounter for antineoplastic chemotherapy: Secondary | ICD-10-CM

## 2014-09-21 DIAGNOSIS — E46 Unspecified protein-calorie malnutrition: Secondary | ICD-10-CM

## 2014-09-21 LAB — CBC WITH DIFFERENTIAL/PLATELET
BASO%: 0.7 % (ref 0.0–2.0)
Basophils Absolute: 0.1 10*3/uL (ref 0.0–0.1)
EOS%: 0.1 % (ref 0.0–7.0)
Eosinophils Absolute: 0 10*3/uL (ref 0.0–0.5)
HEMATOCRIT: 27.8 % — AB (ref 34.8–46.6)
HGB: 9.2 g/dL — ABNORMAL LOW (ref 11.6–15.9)
LYMPH%: 5.7 % — ABNORMAL LOW (ref 14.0–49.7)
MCH: 28.9 pg (ref 25.1–34.0)
MCHC: 32.9 g/dL (ref 31.5–36.0)
MCV: 87.9 fL (ref 79.5–101.0)
MONO#: 1.6 10*3/uL — ABNORMAL HIGH (ref 0.1–0.9)
MONO%: 16.1 % — ABNORMAL HIGH (ref 0.0–14.0)
NEUT#: 7.9 10*3/uL — ABNORMAL HIGH (ref 1.5–6.5)
NEUT%: 77.4 % — AB (ref 38.4–76.8)
Platelets: 357 10*3/uL (ref 145–400)
RBC: 3.16 10*6/uL — ABNORMAL LOW (ref 3.70–5.45)
RDW: 17.1 % — ABNORMAL HIGH (ref 11.2–14.5)
WBC: 10.2 10*3/uL (ref 3.9–10.3)
lymph#: 0.6 10*3/uL — ABNORMAL LOW (ref 0.9–3.3)

## 2014-09-21 LAB — COMPREHENSIVE METABOLIC PANEL (CC13)
ALT: 9 U/L (ref 0–55)
AST: 14 U/L (ref 5–34)
Albumin: 3.2 g/dL — ABNORMAL LOW (ref 3.5–5.0)
Alkaline Phosphatase: 83 U/L (ref 40–150)
Anion Gap: 8 mEq/L (ref 3–11)
BILIRUBIN TOTAL: 0.25 mg/dL (ref 0.20–1.20)
BUN: 8.8 mg/dL (ref 7.0–26.0)
CO2: 25 mEq/L (ref 22–29)
CREATININE: 0.7 mg/dL (ref 0.6–1.1)
Calcium: 10.1 mg/dL (ref 8.4–10.4)
Chloride: 99 mEq/L (ref 98–109)
Glucose: 142 mg/dl — ABNORMAL HIGH (ref 70–140)
Potassium: 3.9 mEq/L (ref 3.5–5.1)
Sodium: 132 mEq/L — ABNORMAL LOW (ref 136–145)
Total Protein: 6.9 g/dL (ref 6.4–8.3)

## 2014-09-21 MED ORDER — PROCHLORPERAZINE MALEATE 10 MG PO TABS: 10.0000 mg | ORAL_TABLET | Freq: Four times a day (QID) | ORAL | Status: DC | PRN

## 2014-09-21 MED ORDER — DIPHENHYDRAMINE HCL 50 MG/ML IJ SOLN
INTRAMUSCULAR | Status: AC
  Filled 2014-09-21: qty 1

## 2014-09-21 MED ORDER — SODIUM CHLORIDE 0.9 % IV SOLN
Freq: Once | INTRAVENOUS | Status: AC
  Administered 2014-09-21: 11:00:00 via INTRAVENOUS

## 2014-09-21 MED ORDER — PACLITAXEL CHEMO INJECTION 300 MG/50ML
80.0000 mg/m2 | Freq: Once | INTRAVENOUS | Status: AC
  Administered 2014-09-21: 162 mg via INTRAVENOUS
  Filled 2014-09-21: qty 27

## 2014-09-21 MED ORDER — FAMOTIDINE IN NACL 20-0.9 MG/50ML-% IV SOLN
20.0000 mg | Freq: Once | INTRAVENOUS | Status: AC
  Administered 2014-09-21: 20 mg via INTRAVENOUS

## 2014-09-21 MED ORDER — ONDANSETRON HCL 8 MG PO TABS: 8.0000 mg | ORAL_TABLET | Freq: Two times a day (BID) | ORAL | Status: DC

## 2014-09-21 MED ORDER — DEXAMETHASONE 4 MG PO TABS: 8.0000 mg | ORAL_TABLET | Freq: Two times a day (BID) | ORAL | Status: DC

## 2014-09-21 MED ORDER — DEXAMETHASONE SODIUM PHOSPHATE 20 MG/5ML IJ SOLN
INTRAMUSCULAR | Status: AC
  Filled 2014-09-21: qty 5

## 2014-09-21 MED ORDER — LORAZEPAM 0.5 MG PO TABS: 0.5000 mg | ORAL_TABLET | Freq: Four times a day (QID) | ORAL | Status: DC | PRN

## 2014-09-21 MED ORDER — DEXAMETHASONE SODIUM PHOSPHATE 20 MG/5ML IJ SOLN
20.0000 mg | Freq: Once | INTRAMUSCULAR | Status: AC
  Administered 2014-09-21: 20 mg via INTRAVENOUS

## 2014-09-21 MED ORDER — ONDANSETRON 8 MG/NS 50 ML IVPB
INTRAVENOUS | Status: AC
  Filled 2014-09-21: qty 8

## 2014-09-21 MED ORDER — FAMOTIDINE IN NACL 20-0.9 MG/50ML-% IV SOLN
INTRAVENOUS | Status: AC
  Filled 2014-09-21: qty 50

## 2014-09-21 MED ORDER — HEPARIN SOD (PORK) LOCK FLUSH 100 UNIT/ML IV SOLN
500.0000 [IU] | Freq: Once | INTRAVENOUS | Status: AC | PRN
  Administered 2014-09-21: 500 [IU]
  Filled 2014-09-21: qty 5

## 2014-09-21 MED ORDER — ONDANSETRON 8 MG/50ML IVPB (CHCC)
8.0000 mg | Freq: Once | INTRAVENOUS | Status: AC
  Administered 2014-09-21: 8 mg via INTRAVENOUS

## 2014-09-21 MED ORDER — SODIUM CHLORIDE 0.9 % IJ SOLN
10.0000 mL | INTRAMUSCULAR | Status: DC | PRN
  Administered 2014-09-21: 10 mL
  Filled 2014-09-21: qty 10

## 2014-09-21 MED ORDER — DIPHENHYDRAMINE HCL 50 MG/ML IJ SOLN
50.0000 mg | Freq: Once | INTRAMUSCULAR | Status: AC
  Administered 2014-09-21: 50 mg via INTRAVENOUS

## 2014-09-21 NOTE — Telephone Encounter (Signed)
I have adjusted 12/11 appt

## 2014-09-21 NOTE — Progress Notes (Signed)
Patient completed first time taxol without any issues. Discharged home with AVS with future appts, taxol, information, and instructions on how to take steroids and nausea medications.

## 2014-09-21 NOTE — Telephone Encounter (Signed)
Gave avs & cal for Dec. Sent mess to move tx to after MD appt.

## 2014-09-21 NOTE — Patient Instructions (Addendum)
Kimberly Reed Discharge Instructions for Patients Receiving Chemotherapy  Today you received the following chemotherapy agents: Taxol  To help prevent nausea and vomiting after your treatment, we encourage you to take your nausea medication as directed.    If you develop nausea and vomiting that is not controlled by your nausea medication, call the clinic.   BELOW ARE SYMPTOMS THAT SHOULD BE REPORTED IMMEDIATELY:  *FEVER GREATER THAN 100.5 F  *CHILLS WITH OR WITHOUT FEVER  NAUSEA AND VOMITING THAT IS NOT CONTROLLED WITH YOUR NAUSEA MEDICATION  *UNUSUAL SHORTNESS OF BREATH  *UNUSUAL BRUISING OR BLEEDING  TENDERNESS IN MOUTH AND THROAT WITH OR WITHOUT PRESENCE OF ULCERS  *URINARY PROBLEMS  *BOWEL PROBLEMS  UNUSUAL RASH Items with * indicate a potential emergency and should be followed up as soon as possible.  Feel free to call the clinic you have any questions or concerns. The clinic phone number is [phone].   Paclitaxel injection What is this medicine? PACLITAXEL (PAK li TAX el) is a chemotherapy drug. It targets fast dividing cells, like cancer cells, and causes these cells to die. This medicine is used to treat ovarian cancer, breast cancer, and other cancers. This medicine may be used for other purposes; ask your health care provider or pharmacist if you have questions. COMMON BRAND NAME(S): Onxol, Taxol What should I tell my health care provider before I take this medicine? They need to know if you have any of these conditions: -blood disorders -irregular heartbeat -infection (especially a virus infection such as chickenpox, cold sores, or herpes) -liver disease -previous or ongoing radiation therapy -an unusual or allergic reaction to paclitaxel, alcohol, polyoxyethylated castor oil, other chemotherapy agents, other medicines, foods, dyes, or preservatives -pregnant or trying to get pregnant -breast-feeding How should I use this medicine? This  drug is given as an infusion into a vein. It is administered in a hospital or clinic by a specially trained health care professional. Talk to your pediatrician regarding the use of this medicine in children. Special care may be needed. Overdosage: If you think you have taken too much of this medicine contact a poison control center or emergency room at once. NOTE: This medicine is only for you. Do not share this medicine with others. What if I miss a dose? It is important not to miss your dose. Call your doctor or health care professional if you are unable to keep an appointment. What may interact with this medicine? Do not take this medicine with any of the following medications: -disulfiram -metronidazole This medicine may also interact with the following medications: -cyclosporine -diazepam -ketoconazole -medicines to increase blood counts like filgrastim, pegfilgrastim, sargramostim -other chemotherapy drugs like cisplatin, doxorubicin, epirubicin, etoposide, teniposide, vincristine -quinidine -testosterone -vaccines -verapamil Talk to your doctor or health care professional before taking any of these medicines: -acetaminophen -aspirin -ibuprofen -ketoprofen -naproxen This list may not describe all possible interactions. Give your health care provider a list of all the medicines, herbs, non-prescription drugs, or dietary supplements you use. Also tell them if you smoke, drink alcohol, or use illegal drugs. Some items may interact with your medicine. What should I watch for while using this medicine? Your condition will be monitored carefully while you are receiving this medicine. You will need important blood work done while you are taking this medicine. This drug may make you feel generally unwell. This is not uncommon, as chemotherapy can affect healthy cells as well as cancer cells. Report any side effects. Continue your course of  treatment even though you feel ill unless your  doctor tells you to stop. In some cases, you may be given additional medicines to help with side effects. Follow all directions for their use. Call your doctor or health care professional for advice if you get a fever, chills or sore throat, or other symptoms of a cold or flu. Do not treat yourself. This drug decreases your body's ability to fight infections. Try to avoid being around people who are sick. This medicine may increase your risk to bruise or bleed. Call your doctor or health care professional if you notice any unusual bleeding. Be careful brushing and flossing your teeth or using a toothpick because you may get an infection or bleed more easily. If you have any dental work done, tell your dentist you are receiving this medicine. Avoid taking products that contain aspirin, acetaminophen, ibuprofen, naproxen, or ketoprofen unless instructed by your doctor. These medicines may hide a fever. Do not become pregnant while taking this medicine. Women should inform their doctor if they wish to become pregnant or think they might be pregnant. There is a potential for serious side effects to an unborn child. Talk to your health care professional or pharmacist for more information. Do not breast-feed an infant while taking this medicine. Men are advised not to father a child while receiving this medicine. What side effects may I notice from receiving this medicine? Side effects that you should report to your doctor or health care professional as soon as possible: -allergic reactions like skin rash, itching or hives, swelling of the face, lips, or tongue -low blood counts - This drug may decrease the number of white blood cells, red blood cells and platelets. You may be at increased risk for infections and bleeding. -signs of infection - fever or chills, cough, sore throat, pain or difficulty passing urine -signs of decreased platelets or bleeding - bruising, pinpoint red spots on the skin, black,  tarry stools, nosebleeds -signs of decreased red blood cells - unusually weak or tired, fainting spells, lightheadedness -breathing problems -chest pain -high or low blood pressure -mouth sores -nausea and vomiting -pain, swelling, redness or irritation at the injection site -pain, tingling, numbness in the hands or feet -slow or irregular heartbeat -swelling of the ankle, feet, hands Side effects that usually do not require medical attention (report to your doctor or health care professional if they continue or are bothersome): -bone pain -complete hair loss including hair on your head, underarms, pubic hair, eyebrows, and eyelashes -changes in the color of fingernails -diarrhea -loosening of the fingernails -loss of appetite -muscle or joint pain -red flush to skin -sweating This list may not describe all possible side effects. Call your doctor for medical advice about side effects. You may report side effects to FDA at 1-800-FDA-1088. Where should I keep my medicine? This drug is given in a hospital or clinic and will not be stored at home. NOTE: This sheet is a summary. It may not cover all possible information. If you have questions about this medicine, talk to your doctor, pharmacist, or health care provider.  2015, [employer]. (2012-12-05 16:41:21)

## 2014-09-21 NOTE — Progress Notes (Signed)
Patient Care Team: Rachell Cipro, MD as PCP - General (Family Medicine) [facility]  Rulon Eisenmenger, MD as Consulting Physician (Hematology and Oncology) [name], MD as Consulting Physician (General Surgery) Eppie Gibson, MD as Attending Physician (Radiation Oncology)  DIAGNOSIS: Breast cancer of upper-outer quadrant of left female breast   Staging form: Breast, AJCC 7th Edition     Clinical: Stage IV (T2, N1, M1) - Signed by Rulon Eisenmenger, MD on 08/16/2014       Staging comments: Staged at breast conference 06/06/14.      Pathologic: No stage assigned - Unsigned   SUMMARY OF ONCOLOGIC HISTORY:   Breast cancer of upper-outer quadrant of left female breast   05/18/2014 Mammogram Suspicious left upper outer quadrant 5 cm segmental area of calcifications.    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   05/30/2014 Breast MRI Left Breast: 12oclock: 21 x 23 x 30 mm, Numerous  level 1 and 2 LN; Liver lesions cycts by Liver MRI   06/07/2014 Initial Biopsy Left axillary lymph node biopsy invasive ductal carcinoma ER 100%, PR 11% Ki-67 15% HER-2 negative ratio 1.41   06/13/2014 PET scan and metabolic she were left breast, hypermetabolic left axillary and left retropectoral lymph node, small lucent lesion in L1 vertebra which was biopsied and proven to be bone metastases   07/19/2014 Initial Biopsy Biopsy of L1 vertebra metastatic carcinoma breast primary, ER 100%, PR 4%, HER-2 negative ratio 1.3   07/26/2014 -  Neo-Adjuvant Chemotherapy Even though she has L1 solitary bone metastases, patient is being treated definitively with neoadjuvant chemotherapy with dose dense Adriamycin and Cytoxan to be followed by weekly Taxol x12    CHIEF COMPLIANT: Patient is here for cycle 1 of weekly Taxol  INTERVAL HISTORY: Kimberly Reed is a 58 year old Caucasian with above-mentioned history of breast cancer with  solitary bone metastases who is currently being treated with neoadjuvant chemotherapy now status post 4 cycles of dose dense Adriamycin and Cytoxan. She presents to proceed with cycle 1 of weekly Taxol planned for a total of 12 weeks. Overall she tolerated her 4 cycles of dose dense Adriamycin relatively well with the exception of fatigue and decreased appetite and malaise. She reports some dizziness. After detailed discussion of her by mouth intake it is clear that her caloric intake is significantly decreased and her fluid intake is marginal at best. She was encouraged to increase her by mouth intake of both food and fluids. She was also encouraged to change her position slowly so as not to incur positional syncopal episodes. Her weight is relatively stable, only decreased by approximately 2 pounds since her last office visit on 09/06/2014.  REVIEW OF SYSTEMS:   Constitutional: Denies fevers, chills or abnormal weight loss. Does report decreased appetite and decreased by mouth intake of food and fluids. Eyes: Denies blurriness of vision Ears, nose, mouth, throat, and face: Denies mucositis or sore throat Respiratory: Denies cough, dyspnea or wheezes Cardiovascular: Denies palpitation, chest discomfort or lower extremity swelling Gastrointestinal:  Denies nausea, heartburn or change in bowel habits Skin: Denies abnormal skin rashes Lymphatics: Denies new lymphadenopathy or easy bruising Neurological:Denies numbness, tingling or new weaknesses. Does report some dizziness particularly with position change Behavioral/Psych: Mood is stable, no new changes  Breast:  denies any pain or lumps or nodules in either breasts All other systems were reviewed with the patient and are negative.  I have reviewed the past medical history,  past surgical history, social history and family history with the patient and they are unchanged from previous note.  ALLERGIES:  is allergic to other and  effexor.  MEDICATIONS:  Current Outpatient Prescriptions  Medication Sig Dispense Refill  . ALPRAZolam (XANAX) 0.5 MG tablet Take 1 tablet (0.5 mg total) by mouth 3 (three) times daily as needed for anxiety. 60 tablet 3  . ARIPiprazole (ABILIFY) 10 MG tablet Take 5 mg by mouth every morning.     . ARIPiprazole (ABILIFY) 2 MG tablet Take 1 tablet (2 mg total) by mouth daily. 90 tablet 3  . dorzolamide (TRUSOPT) 2 % ophthalmic solution Place 1 drop into the right eye 2 (two) times daily.     [name] lidocaine-prilocaine (EMLA) cream Apply 1 application topically as needed. 30 g 0  . OVER THE COUNTER MEDICATION Take 1 capsule by mouth daily.    [name] thyroid (ARMOUR THYROID) 180 MG tablet Take 1 tablet (180 mg total) by mouth daily before breakfast. 90 tablet 2  . Travoprost, BAK Free, (TRAVATAN) 0.004 % SOLN ophthalmic solution Place 1 drop into the right eye at bedtime.     [name] acidophilus (RISAQUAD) CAPS capsule Take 1 capsule by mouth daily.    . brimonidine-timolol (COMBIGAN) 0.2-0.5 % ophthalmic solution Place 1 drop into the right eye every 12 (twelve) hours.     . Cholecalciferol (VITAMIN D-3) 5000 UNITS TABS Take 1 tablet by mouth daily.    . Flaxseed, Linseed, (FLAX SEED OIL PO) Take 1 capsule by mouth daily.    [name] HYDROcodone-acetaminophen (NORCO) 5-325 MG per tablet Take 1-2 tablets by mouth every 6 (six) hours as needed. (Patient not taking: Reported on 09/21/2014) 30 tablet 0  . LORazepam (ATIVAN) 1 MG tablet Take 1 tablet (1 mg total) by mouth 2 (two) times daily as needed for anxiety. (Patient not taking: Reported on 09/21/2014) 60 tablet 0  . Omega-3 Fatty Acids (OMEGA 3 PO) Take 1 capsule by mouth daily.    . WHEY PROTEIN PO Take 1 scoop by mouth daily.     No current facility-administered medications for this visit.    PHYSICAL EXAMINATION: ECOG PERFORMANCE STATUS: 1  Filed Vitals:   09/21/14 0940  BP: 106/50  Pulse: 94  Temp: 98.5 F (36.9 C)  Resp: 18   Filed Weights    09/21/14 0940  Weight: 195 lb 3.2 oz (88.542 kg)    GENERAL:alert, no distress and comfortable SKIN: skin color, texture, turgor are normal, no rashes or significant lesions EYES: normal, Conjunctiva are pink and non-injected, sclera clear OROPHARYNX:no exudate, no erythema and lips, buccal mucosa, and tongue normal  NECK: supple, thyroid normal size, non-tender, without nodularity LYMPH:  no palpable lymphadenopathy in the cervical, axillary or inguinal LUNGS: clear to auscultation and percussion with normal breathing effort HEART: regular rate & rhythm and no murmurs and no lower extremity edema ABDOMEN:abdomen soft, non-tender and normal bowel sounds Musculoskeletal:no cyanosis of digits and no clubbing  NEURO: alert & oriented x 3 with fluent speech, no focal motor/sensory deficits BREAST: Examination deferred   LABORATORY DATA:  I have reviewed the data as listed   Chemistry      Component Value Date/Time   NA 132* 09/21/2014 0920   NA 141 07/19/2014 0959   K 3.9 09/21/2014 0920   K 4.1 07/19/2014 0959   CL 105 07/19/2014 0959   CO2 25 09/21/2014 0920   CO2 25 07/19/2014 0959   BUN 8.8 09/21/2014 0920   BUN 20 07/19/2014  0959   CREATININE 0.7 09/21/2014 0920   CREATININE 0.61 07/19/2014 0959   CREATININE 0.73 03/05/2014 1044      Component Value Date/Time   CALCIUM 10.1 09/21/2014 0920   CALCIUM 9.9 07/19/2014 0959   ALKPHOS 83 09/21/2014 0920   ALKPHOS 53 06/06/2014 1229   AST 14 09/21/2014 0920   AST 14 06/06/2014 1229   ALT 9 09/21/2014 0920   ALT 12 06/06/2014 1229   BILITOT 0.25 09/21/2014 0920   BILITOT 0.3 06/06/2014 1229       Lab Results  Component Value Date   WBC 10.2 09/21/2014   HGB 9.2* 09/21/2014   HCT 27.8* 09/21/2014   MCV 87.9 09/21/2014   PLT 357 09/21/2014   NEUTROABS 7.9* 09/21/2014     ASSESSMENT & PLAN:  No problem-specific assessment & plan notes found for this encounter.   No orders of the defined types were placed in this  encounter.   Patient will proceed with cycle #1 of her weekly Taxol with a total of 12 weeks plan. Patient was reviewed with Dr. Julien Nordmann in Dr. Geralyn Flash is absence. She will continue with weekly labs and weekly Taxol as scheduled. She'll follow-up with Dr. Lindi Adie in 2 weeks for another symptom management visit. She is encouraged to increase her by mouth intake both of food and fluids. She is also encouraged to change positions slowly so as to avoid rapid change in position syncopal episodes. The patient has a good understanding of the overall plan. she agrees with it. She will call with any problems that may develop before her next visit here.  I spent 20 minutes counseling the patient face to face. The total time spent in the appointment was 25 minutes and more than 50% was on counseling and review of test results. Patient was given a digital thermometer for monitor for home use.    Carlton Adam, PA-C 09/21/2014 10:21 AM

## 2014-09-28 ENCOUNTER — Ambulatory Visit (HOSPITAL_BASED_OUTPATIENT_CLINIC_OR_DEPARTMENT_OTHER): Payer: Managed Care, Other (non HMO)

## 2014-09-28 ENCOUNTER — Other Ambulatory Visit: Payer: Self-pay

## 2014-09-28 ENCOUNTER — Other Ambulatory Visit (HOSPITAL_BASED_OUTPATIENT_CLINIC_OR_DEPARTMENT_OTHER): Payer: Managed Care, Other (non HMO)

## 2014-09-28 ENCOUNTER — Other Ambulatory Visit: Payer: Self-pay | Admitting: *Deleted

## 2014-09-28 DIAGNOSIS — Z5111 Encounter for antineoplastic chemotherapy: Secondary | ICD-10-CM

## 2014-09-28 DIAGNOSIS — C50412 Malignant neoplasm of upper-outer quadrant of left female breast: Secondary | ICD-10-CM

## 2014-09-28 DIAGNOSIS — C7951 Secondary malignant neoplasm of bone: Secondary | ICD-10-CM

## 2014-09-28 DIAGNOSIS — C773 Secondary and unspecified malignant neoplasm of axilla and upper limb lymph nodes: Secondary | ICD-10-CM

## 2014-09-28 DIAGNOSIS — F4323 Adjustment disorder with mixed anxiety and depressed mood: Secondary | ICD-10-CM

## 2014-09-28 LAB — CBC WITH DIFFERENTIAL/PLATELET
BASO%: 0.7 % (ref 0.0–2.0)
Basophils Absolute: 0.1 10*3/uL (ref 0.0–0.1)
EOS ABS: 0.1 10*3/uL (ref 0.0–0.5)
EOS%: 1.9 % (ref 0.0–7.0)
HEMATOCRIT: 25.7 % — AB (ref 34.8–46.6)
HGB: 8.4 g/dL — ABNORMAL LOW (ref 11.6–15.9)
LYMPH%: 9 % — AB (ref 14.0–49.7)
MCH: 28.6 pg (ref 25.1–34.0)
MCHC: 32.7 g/dL (ref 31.5–36.0)
MCV: 87.4 fL (ref 79.5–101.0)
MONO#: 1.4 10*3/uL — ABNORMAL HIGH (ref 0.1–0.9)
MONO%: 20.5 % — ABNORMAL HIGH (ref 0.0–14.0)
NEUT#: 4.7 10*3/uL (ref 1.5–6.5)
NEUT%: 67.9 % (ref 38.4–76.8)
PLATELETS: 484 10*3/uL — AB (ref 145–400)
RBC: 2.94 10*6/uL — ABNORMAL LOW (ref 3.70–5.45)
RDW: 17.1 % — ABNORMAL HIGH (ref 11.2–14.5)
WBC: 6.9 10*3/uL (ref 3.9–10.3)
lymph#: 0.6 10*3/uL — ABNORMAL LOW (ref 0.9–3.3)
nRBC: 0 % (ref 0–0)

## 2014-09-28 LAB — COMPREHENSIVE METABOLIC PANEL (CC13)
ALBUMIN: 3 g/dL — AB (ref 3.5–5.0)
ALK PHOS: 63 U/L (ref 40–150)
ALT: 22 U/L (ref 0–55)
AST: 16 U/L (ref 5–34)
Anion Gap: 8 mEq/L (ref 3–11)
BUN: 8.6 mg/dL (ref 7.0–26.0)
CO2: 26 mEq/L (ref 22–29)
CREATININE: 0.7 mg/dL (ref 0.6–1.1)
Calcium: 9.7 mg/dL (ref 8.4–10.4)
Chloride: 99 mEq/L (ref 98–109)
EGFR: >90 mL/min/{1.73_m2} (ref >90)
Glucose: 123 mg/dl (ref 70–140)
POTASSIUM: 3.8 meq/L (ref 3.5–5.1)
Sodium: 133 mEq/L — ABNORMAL LOW (ref 136–145)
Total Bilirubin: 0.26 mg/dL (ref 0.20–1.20)
Total Protein: 6.3 g/dL — ABNORMAL LOW (ref 6.4–8.3)

## 2014-09-28 MED ORDER — LIDOCAINE-PRILOCAINE 2.5-2.5 % EX CREA: 1.0000 "application " | TOPICAL_CREAM | CUTANEOUS | Status: DC | PRN

## 2014-09-28 MED ORDER — SODIUM CHLORIDE 0.9 % IV SOLN
Freq: Once | INTRAVENOUS | Status: AC
  Administered 2014-09-28: 12:00:00 via INTRAVENOUS

## 2014-09-28 MED ORDER — HEPARIN SOD (PORK) LOCK FLUSH 100 UNIT/ML IV SOLN
500.0000 [IU] | Freq: Once | INTRAVENOUS | Status: AC | PRN
  Administered 2014-09-28: 500 [IU]
  Filled 2014-09-28: qty 5

## 2014-09-28 MED ORDER — SODIUM CHLORIDE 0.9 % IJ SOLN
10.0000 mL | INTRAMUSCULAR | Status: DC | PRN
  Administered 2014-09-28: 10 mL
  Filled 2014-09-28: qty 10

## 2014-09-28 MED ORDER — DEXAMETHASONE SODIUM PHOSPHATE 20 MG/5ML IJ SOLN
20.0000 mg | Freq: Once | INTRAMUSCULAR | Status: AC
  Administered 2014-09-28: 20 mg via INTRAVENOUS

## 2014-09-28 MED ORDER — DIPHENHYDRAMINE HCL 50 MG/ML IJ SOLN
50.0000 mg | Freq: Once | INTRAMUSCULAR | Status: AC
  Administered 2014-09-28: 50 mg via INTRAVENOUS

## 2014-09-28 MED ORDER — ONDANSETRON 8 MG/50ML IVPB (CHCC)
8.0000 mg | Freq: Once | INTRAVENOUS | Status: AC
  Administered 2014-09-28: 8 mg via INTRAVENOUS

## 2014-09-28 MED ORDER — FAMOTIDINE IN NACL 20-0.9 MG/50ML-% IV SOLN
20.0000 mg | Freq: Once | INTRAVENOUS | Status: AC
Start: 2014-09-28 — End: 2014-09-28
  Administered 2014-09-28: 20 mg via INTRAVENOUS

## 2014-09-28 MED ORDER — PACLITAXEL CHEMO INJECTION 300 MG/50ML
80.0000 mg/m2 | Freq: Once | INTRAVENOUS | Status: AC
  Administered 2014-09-28: 162 mg via INTRAVENOUS
  Filled 2014-09-28: qty 27

## 2014-09-28 NOTE — Patient Instructions (Signed)
[facility] Discharge Instructions for Patients Receiving Chemotherapy  Today you received the following chemotherapy agents taxol  To help prevent nausea and vomiting after your treatment, we encourage you to take your nausea medication as directed   If you develop nausea and vomiting that is not controlled by your nausea medication, call the clinic.   BELOW ARE SYMPTOMS THAT SHOULD BE REPORTED IMMEDIATELY:  *FEVER GREATER THAN 100.5 F  *CHILLS WITH OR WITHOUT FEVER  NAUSEA AND VOMITING THAT IS NOT CONTROLLED WITH YOUR NAUSEA MEDICATION  *UNUSUAL SHORTNESS OF BREATH  *UNUSUAL BRUISING OR BLEEDING  TENDERNESS IN MOUTH AND THROAT WITH OR WITHOUT PRESENCE OF ULCERS  *URINARY PROBLEMS  *BOWEL PROBLEMS  UNUSUAL RASH Items with * indicate a potential emergency and should be followed up as soon as possible.  Feel free to call the clinic you have any questions or concerns. The clinic phone number is [phone].  

## 2014-10-05 ENCOUNTER — Telehealth: Payer: Self-pay | Admitting: Hematology and Oncology

## 2014-10-05 ENCOUNTER — Ambulatory Visit: Payer: Managed Care, Other (non HMO)

## 2014-10-05 ENCOUNTER — Other Ambulatory Visit (HOSPITAL_BASED_OUTPATIENT_CLINIC_OR_DEPARTMENT_OTHER): Payer: Managed Care, Other (non HMO)

## 2014-10-05 ENCOUNTER — Ambulatory Visit (HOSPITAL_BASED_OUTPATIENT_CLINIC_OR_DEPARTMENT_OTHER): Payer: Managed Care, Other (non HMO) | Admitting: Hematology and Oncology

## 2014-10-05 VITALS — BP 101/68 | HR 89 | Temp 100.5°F | Resp 18 | Ht 65.0 in | Wt 195.7 lb

## 2014-10-05 DIAGNOSIS — F329 Major depressive disorder, single episode, unspecified: Secondary | ICD-10-CM

## 2014-10-05 DIAGNOSIS — C773 Secondary and unspecified malignant neoplasm of axilla and upper limb lymph nodes: Secondary | ICD-10-CM

## 2014-10-05 DIAGNOSIS — C7951 Secondary malignant neoplasm of bone: Secondary | ICD-10-CM

## 2014-10-05 DIAGNOSIS — F32A Depression, unspecified: Secondary | ICD-10-CM

## 2014-10-05 DIAGNOSIS — R5383 Other fatigue: Secondary | ICD-10-CM

## 2014-10-05 DIAGNOSIS — C50412 Malignant neoplasm of upper-outer quadrant of left female breast: Secondary | ICD-10-CM

## 2014-10-05 DIAGNOSIS — R509 Fever, unspecified: Secondary | ICD-10-CM

## 2014-10-05 DIAGNOSIS — D6481 Anemia due to antineoplastic chemotherapy: Secondary | ICD-10-CM

## 2014-10-05 LAB — COMPREHENSIVE METABOLIC PANEL (CC13)
ALT: 25 U/L (ref 0–55)
ANION GAP: 8 meq/L (ref 3–11)
AST: 19 U/L (ref 5–34)
Albumin: 3 g/dL — ABNORMAL LOW (ref 3.5–5.0)
Alkaline Phosphatase: 64 U/L (ref 40–150)
BILIRUBIN TOTAL: 0.28 mg/dL (ref 0.20–1.20)
BUN: 11.9 mg/dL (ref 7.0–26.0)
CO2: 25 mEq/L (ref 22–29)
CREATININE: 0.7 mg/dL (ref 0.6–1.1)
Calcium: 9.8 mg/dL (ref 8.4–10.4)
Chloride: 100 mEq/L (ref 98–109)
EGFR: >90 mL/min/{1.73_m2} (ref >90)
Glucose: 103 mg/dl (ref 70–140)
Potassium: 3.9 mEq/L (ref 3.5–5.1)
Sodium: 134 mEq/L — ABNORMAL LOW (ref 136–145)
Total Protein: 6.5 g/dL (ref 6.4–8.3)

## 2014-10-05 LAB — URINALYSIS, MICROSCOPIC - CHCC
BLOOD: NEGATIVE
Bilirubin (Urine): NEGATIVE
Glucose: NEGATIVE mg/dL
KETONES: NEGATIVE mg/dL
Nitrite: NEGATIVE
PROTEIN: NEGATIVE mg/dL
SPECIFIC GRAVITY, URINE: 1.015 (ref 1.003–1.035)
Urobilinogen, UR: 0.2 mg/dL (ref 0.2–1)
pH: 6 (ref 4.6–8.0)

## 2014-10-05 LAB — CBC WITH DIFFERENTIAL/PLATELET
BASO%: 1 % (ref 0.0–2.0)
Basophils Absolute: 0.1 10*3/uL (ref 0.0–0.1)
EOS ABS: 0.6 10*3/uL — AB (ref 0.0–0.5)
EOS%: 8.7 % — ABNORMAL HIGH (ref 0.0–7.0)
HCT: 27.4 % — ABNORMAL LOW (ref 34.8–46.6)
HEMOGLOBIN: 8.7 g/dL — AB (ref 11.6–15.9)
LYMPH%: 11.7 % — ABNORMAL LOW (ref 14.0–49.7)
MCH: 28.3 pg (ref 25.1–34.0)
MCHC: 31.9 g/dL (ref 31.5–36.0)
MCV: 88.8 fL (ref 79.5–101.0)
MONO#: 1.1 10*3/uL — AB (ref 0.1–0.9)
MONO%: 17.4 % — ABNORMAL HIGH (ref 0.0–14.0)
NEUT%: 61.2 % (ref 38.4–76.8)
NEUTROS ABS: 4 10*3/uL (ref 1.5–6.5)
PLATELETS: 432 10*3/uL — AB (ref 145–400)
RBC: 3.09 10*6/uL — AB (ref 3.70–5.45)
RDW: 18.4 % — AB (ref 11.2–14.5)
WBC: 6.5 10*3/uL (ref 3.9–10.3)
lymph#: 0.8 10*3/uL — ABNORMAL LOW (ref 0.9–3.3)

## 2014-10-05 MED ORDER — LEVOFLOXACIN 750 MG PO TABS: 750.0000 mg | ORAL_TABLET | Freq: Every day | ORAL | Status: DC

## 2014-10-05 MED ORDER — VENLAFAXINE HCL ER 37.5 MG PO CP24: 37.5000 mg | ORAL_CAPSULE | Freq: Every day | ORAL | Status: DC

## 2014-10-05 NOTE — Progress Notes (Signed)
Patient Care Team: Rachell Cipro, MD as PCP - General (Family Medicine) [facility]  Rulon Eisenmenger, MD as Consulting Physician (Hematology and Oncology) [name], MD as Consulting Physician (General Surgery) Wyvonnia Lora, MD as Attending Physician (Radiation Oncology)  DIAGNOSIS: Breast cancer of upper-outer quadrant of left female breast   Staging form: Breast, AJCC 7th Edition     Clinical: Stage IV (T2, N1, M1) - Signed by Rulon Eisenmenger, MD on 08/16/2014       Staging comments: Staged at breast conference 06/06/14.      Pathologic: No stage assigned - Unsigned   SUMMARY OF ONCOLOGIC HISTORY:   Breast cancer of upper-outer quadrant of left female breast   05/18/2014 Mammogram Suspicious left upper outer quadrant 5 cm segmental area of calcifications.    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   05/30/2014 Breast MRI Left Breast: 12oclock: 21 x 23 x 30 mm, Numerous  level 1 and 2 LN; Liver lesions cycts by Liver MRI   06/07/2014 Initial Biopsy Left axillary lymph node biopsy invasive ductal carcinoma ER 100%, PR 11% Ki-67 15% HER-2 negative ratio 1.41   06/13/2014 PET scan and metabolic she were left breast, hypermetabolic left axillary and left retropectoral lymph node, small lucent lesion in L1 vertebra which was biopsied and proven to be bone metastases   07/19/2014 Initial Biopsy Biopsy of L1 vertebra metastatic carcinoma breast primary, ER 100%, PR 4%, HER-2 negative ratio 1.3   07/26/2014 -  Neo-Adjuvant Chemotherapy Even though she has L1 solitary bone metastases, patient is being treated definitively with neoadjuvant chemotherapy with dose dense Adriamycin and Cytoxan to be followed by weekly Taxol x12    CHIEF COMPLIANT: Neoadjuvant chemotherapy weekly Taxol 3/12  INTERVAL HISTORY: Kimberly Reed is a 58 year old lady with above-mentioned history of left-sided breast cancer  being treated with neoadjuvant chemotherapy. She finished dose dense Adriamycin and Cytoxan without any major problems. She started weekly Taxol treatments and today is week 3. She feels very warm today and had a temperature of 100.5 and she came in. Upon recheck it was up to 101.5. She denies any urinary symptoms or cough expectoration or sinus drainage. She denies feeling ill or sick otherwise. She also reports that she is feeling more depressed than usual and does not want to take Abilify and wants to suggest another antidepressant.  REVIEW OF SYSTEMS:   Constitutional: Denies fevers, chills or abnormal weight loss Eyes: Denies blurriness of vision Ears, nose, mouth, throat, and face: Denies mucositis or sore throat Respiratory: Denies cough, dyspnea or wheezes Cardiovascular: Denies palpitation, chest discomfort or lower extremity swelling Gastrointestinal:  Denies nausea, heartburn or change in bowel habits Skin: Denies abnormal skin rashes Lymphatics: Denies new lymphadenopathy or easy bruising Neurological:Denies numbness, tingling or new weaknesses Behavioral/Psych: Mood is stable, no new changes  All other systems were reviewed with the patient and are negative.  I have reviewed the past medical history, past surgical history, social history and family history with the patient and they are unchanged from previous note.  ALLERGIES:  is allergic to other and effexor.  MEDICATIONS:  Current Outpatient Prescriptions  Medication Sig Dispense Refill  . acidophilus (RISAQUAD) CAPS capsule Take 1 capsule by mouth daily.    [name] ALPRAZolam (XANAX) 0.5 MG tablet Take 1 tablet (0.5 mg total) by mouth 3 (three) times daily as needed for anxiety. 60 tablet 3  . brimonidine-timolol (COMBIGAN) 0.2-0.5 % ophthalmic solution  Place 1 drop into the right eye every 12 (twelve) hours.     . Cholecalciferol (VITAMIN D-3) 5000 UNITS TABS Take 1 tablet by mouth daily.    [name] dexamethasone (DECADRON) 4 MG  tablet Take 2 tablets (8 mg total) by mouth 2 (two) times daily with a meal. Start the day after chemotherapy for 2 days. Take with food. 30 tablet 1  . dorzolamide (TRUSOPT) 2 % ophthalmic solution Place 1 drop into the right eye 2 (two) times daily.     . Flaxseed, Linseed, (FLAX SEED OIL PO) Take 1 capsule by mouth daily.    [name] HYDROcodone-acetaminophen (NORCO) 5-325 MG per tablet Take 1-2 tablets by mouth every 6 (six) hours as needed. (Patient not taking: Reported on 09/21/2014) 30 tablet 0  . levofloxacin (LEVAQUIN) 750 MG tablet Take 1 tablet (750 mg total) by mouth daily. 7 tablet 0  . lidocaine-prilocaine (EMLA) cream Apply 1 application topically as needed. 30 g 0  . LORazepam (ATIVAN) 1 MG tablet Take 1 tablet (1 mg total) by mouth 2 (two) times daily as needed for anxiety. (Patient not taking: Reported on 09/21/2014) 60 tablet 0  . Omega-3 Fatty Acids (OMEGA 3 PO) Take 1 capsule by mouth daily.    . ondansetron (ZOFRAN) 8 MG tablet Take 1 tablet (8 mg total) by mouth 2 (two) times daily. Start the day after chemo for 2 days. Then take as needed for nausea or vomiting. 30 tablet 1  . OVER THE COUNTER MEDICATION Take 1 capsule by mouth daily.    . prochlorperazine (COMPAZINE) 10 MG tablet Take 1 tablet (10 mg total) by mouth every 6 (six) hours as needed (Nausea or vomiting). 30 tablet 1  . thyroid (ARMOUR THYROID) 180 MG tablet Take 1 tablet (180 mg total) by mouth daily before breakfast. 90 tablet 2  . Travoprost, BAK Free, (TRAVATAN) 0.004 % SOLN ophthalmic solution Place 1 drop into the right eye at bedtime.     [name] venlafaxine XR (EFFEXOR-XR) 37.5 MG 24 hr capsule Take 1 capsule (37.5 mg total) by mouth daily with breakfast. 30 capsule 3  . WHEY PROTEIN PO Take 1 scoop by mouth daily.     No current facility-administered medications for this visit.    PHYSICAL EXAMINATION: ECOG PERFORMANCE STATUS: 1 - Symptomatic but completely ambulatory  Filed Vitals:   10/05/14 1203  BP: 101/68   Pulse: 89  Temp: 100.5 F (38.1 C)  Resp: 18   Filed Weights   10/05/14 1203  Weight: 195 lb 11.2 oz (88.769 kg)    GENERAL:alert, no distress and comfortable SKIN: skin color, texture, turgor are normal, no rashes or significant lesions EYES: normal, Conjunctiva are pink and non-injected, sclera clear OROPHARYNX:no exudate, no erythema and lips, buccal mucosa, and tongue normal  NECK: supple, thyroid normal size, non-tender, without nodularity LYMPH:  no palpable lymphadenopathy in the cervical, axillary or inguinal LUNGS: clear to auscultation and percussion with normal breathing effort HEART: regular rate & rhythm and no murmurs and no lower extremity edema ABDOMEN:abdomen soft, non-tender and normal bowel sounds Musculoskeletal:no cyanosis of digits and no clubbing  NEURO: alert & oriented x 3 with fluent speech, no focal motor/sensory deficits  LABORATORY DATA:  I have reviewed the data as listed   Chemistry      Component Value Date/Time   NA 134* 10/05/2014 1139   NA 141 07/19/2014 0959   K 3.9 10/05/2014 1139   K 4.1 07/19/2014 0959   CL 105 07/19/2014 0959  CO2 25 10/05/2014 1139   CO2 25 07/19/2014 0959   BUN 11.9 10/05/2014 1139   BUN 20 07/19/2014 0959   CREATININE 0.7 10/05/2014 1139   CREATININE 0.61 07/19/2014 0959   CREATININE 0.73 03/05/2014 1044      Component Value Date/Time   CALCIUM 9.8 10/05/2014 1139   CALCIUM 9.9 07/19/2014 0959   ALKPHOS 64 10/05/2014 1139   ALKPHOS 53 06/06/2014 1229   AST 19 10/05/2014 1139   AST 14 06/06/2014 1229   ALT 25 10/05/2014 1139   ALT 12 06/06/2014 1229   BILITOT 0.28 10/05/2014 1139   BILITOT 0.3 06/06/2014 1229       Lab Results  Component Value Date   WBC 6.5 10/05/2014   HGB 8.7* 10/05/2014   HCT 27.4* 10/05/2014   MCV 88.8 10/05/2014   PLT 432* 10/05/2014   NEUTROABS 4.0 10/05/2014   ASSESSMENT & PLAN:  Depression Major Depression: Patient reports that Abilify is not working for her. I  will change her anti depressant to Effexor XR  Breast cancer of upper-outer quadrant of left female breast Metastatic breast cancer in the left breast involving left axillary lymph nodes and isolated L1 vertebral metastases stage IV disease, ER percent, PR 4%, HER-2 negative ratio 1.3 Ki-67 15%  Chemotherapy toxicities: fatigue grade 2, anemia grade 2 alopecia Today is cycle 3 of weekly taxol treatments. Today's treatment is going to be held  Toxicities of chemotherapy: 1. Alopecia 2. Chemotherapy-induced anemia 3. Fatigue 4. Fever 100.5: Will check a urinalysis.  Recheck on the temperature revealed 101.5. We will hold chemotherapy this week and treat her with Levaquin 750 for 1 week and return back in a week for next treatment.  Return to clinic in 2 weeks for follow up and weekly for Taxol treatments.   Orders Placed This Encounter  Procedures  . Urine culture    Standing Status: Future     Number of Occurrences: 1     Standing Expiration Date: 10/05/2015  . CBC with Differential    Standing Status: Standing     Number of Occurrences: 10     Standing Expiration Date: 10/06/2015  . Comprehensive metabolic panel (Cmet) - [facility]    Standing Status: Standing     Number of Occurrences: 10     Standing Expiration Date: 10/06/2015  . Urinalysis, Microscopic - [facility]    Standing Status: Future     Number of Occurrences: 1     Standing Expiration Date: 10/05/2015   The patient has a good understanding of the overall plan. she agrees with it. She will call with any problems that may develop before her next visit here.   Rulon Eisenmenger, MD 10/05/2014 2:02 PM

## 2014-10-05 NOTE — Assessment & Plan Note (Signed)
Major Depression: Patient reports that Abilify is not working for her. I will change her anti depressant to Effexor XR

## 2014-10-05 NOTE — Assessment & Plan Note (Addendum)
Metastatic breast cancer in the left breast involving left axillary lymph nodes and isolated L1 vertebral metastases stage IV disease, ER percent, PR 4%, HER-2 negative ratio 1.3 Ki-67 15%  Chemotherapy toxicities: fatigue grade 2, anemia grade 2 alopecia Today is cycle 3 of weekly taxol treatments. Today's treatment is going to be held  Toxicities of chemotherapy: 1. Alopecia 2. Chemotherapy-induced anemia 3. Fatigue 4. Fever 100.5: Will check a urinalysis.  Recheck on the temperature revealed 101.5. We will hold chemotherapy this week and treat her with Levaquin 750 for 1 week and return back in a week for next treatment.  Return to clinic in 2 weeks for follow up and weekly for Taxol treatments. 

## 2014-10-05 NOTE — Telephone Encounter (Signed)
, °

## 2014-10-06 LAB — URINE CULTURE

## 2014-10-08 ENCOUNTER — Telehealth: Payer: Self-pay | Admitting: *Deleted

## 2014-10-08 NOTE — Telephone Encounter (Signed)
Patient called reporting her temperature is fluctuating.  Woke up 8:00 with temp = 98.0 and now temperature = 100.8.  Reports she was not able to receive chemotherapy on 10-05-2014 due to temperature.  Started Levaquin 750 mg daily on 10-05-2014.  This nurse advised her to continue antibiotic, Force fluids and take tylenol or ibuprofen for temperature.  Will notify Dr. Lindi Adie for any further orders.

## 2014-10-11 ENCOUNTER — Telehealth: Payer: Self-pay | Admitting: Emergency Medicine

## 2014-10-11 NOTE — Telephone Encounter (Signed)
-----   Message from [place], MD sent at 10/05/2014  1:43 PM EST ----- Regarding: RE: Mammogram hold  Ok to remove from mammogram hold.   Josefa Half ----- Message -----    From: Karen Chafe, RN    Sent: 10/03/2014  12:02 PM      To: Brook E Amundson de Berton Lan, MD Subject: Mammogram hold                                 Dr. Quincy Simmonds, this is a former patient of Dr. Charlies Constable.  Dx of Breast Cancer under treatment. Okay to remove from hold?

## 2014-10-12 ENCOUNTER — Ambulatory Visit (HOSPITAL_BASED_OUTPATIENT_CLINIC_OR_DEPARTMENT_OTHER): Payer: Managed Care, Other (non HMO)

## 2014-10-12 ENCOUNTER — Other Ambulatory Visit (HOSPITAL_BASED_OUTPATIENT_CLINIC_OR_DEPARTMENT_OTHER): Payer: Managed Care, Other (non HMO)

## 2014-10-12 ENCOUNTER — Telehealth: Payer: Self-pay | Admitting: *Deleted

## 2014-10-12 DIAGNOSIS — C50412 Malignant neoplasm of upper-outer quadrant of left female breast: Secondary | ICD-10-CM

## 2014-10-12 DIAGNOSIS — C7951 Secondary malignant neoplasm of bone: Secondary | ICD-10-CM

## 2014-10-12 DIAGNOSIS — C773 Secondary and unspecified malignant neoplasm of axilla and upper limb lymph nodes: Secondary | ICD-10-CM

## 2014-10-12 DIAGNOSIS — Z5111 Encounter for antineoplastic chemotherapy: Secondary | ICD-10-CM

## 2014-10-12 LAB — COMPREHENSIVE METABOLIC PANEL (CC13)
ALBUMIN: 2.9 g/dL — AB (ref 3.5–5.0)
ALK PHOS: 70 U/L (ref 40–150)
ALT: 23 U/L (ref 0–55)
AST: 23 U/L (ref 5–34)
Anion Gap: 10 mEq/L (ref 3–11)
BUN: 11 mg/dL (ref 7.0–26.0)
CO2: 25 mEq/L (ref 22–29)
CREATININE: 0.7 mg/dL (ref 0.6–1.1)
Calcium: 9.7 mg/dL (ref 8.4–10.4)
Chloride: 102 mEq/L (ref 98–109)
EGFR: >90 mL/min/{1.73_m2} (ref >90)
Glucose: 112 mg/dl (ref 70–140)
POTASSIUM: 3.6 meq/L (ref 3.5–5.1)
Sodium: 137 mEq/L (ref 136–145)
Total Bilirubin: 0.21 mg/dL (ref 0.20–1.20)
Total Protein: 6.5 g/dL (ref 6.4–8.3)

## 2014-10-12 LAB — CBC WITH DIFFERENTIAL/PLATELET
BASO%: 0.9 % (ref 0.0–2.0)
Basophils Absolute: 0.1 10*3/uL (ref 0.0–0.1)
EOS%: 8 % — ABNORMAL HIGH (ref 0.0–7.0)
Eosinophils Absolute: 0.7 10*3/uL — ABNORMAL HIGH (ref 0.0–0.5)
HEMATOCRIT: 26.9 % — AB (ref 34.8–46.6)
HGB: 8.6 g/dL — ABNORMAL LOW (ref 11.6–15.9)
LYMPH%: 15 % (ref 14.0–49.7)
MCH: 28.2 pg (ref 25.1–34.0)
MCHC: 31.8 g/dL (ref 31.5–36.0)
MCV: 88.7 fL (ref 79.5–101.0)
MONO#: 1.4 10*3/uL — ABNORMAL HIGH (ref 0.1–0.9)
MONO%: 15.5 % — ABNORMAL HIGH (ref 0.0–14.0)
NEUT%: 60.6 % (ref 38.4–76.8)
NEUTROS ABS: 5.3 10*3/uL (ref 1.5–6.5)
PLATELETS: 446 10*3/uL — AB (ref 145–400)
RBC: 3.03 10*6/uL — ABNORMAL LOW (ref 3.70–5.45)
RDW: 19.3 % — ABNORMAL HIGH (ref 11.2–14.5)
WBC: 8.8 10*3/uL (ref 3.9–10.3)
lymph#: 1.3 10*3/uL (ref 0.9–3.3)

## 2014-10-12 MED ORDER — DEXAMETHASONE SODIUM PHOSPHATE 20 MG/5ML IJ SOLN
INTRAMUSCULAR | Status: AC
  Filled 2014-10-12: qty 5

## 2014-10-12 MED ORDER — HEPARIN SOD (PORK) LOCK FLUSH 100 UNIT/ML IV SOLN
500.0000 [IU] | Freq: Once | INTRAVENOUS | Status: AC | PRN
  Administered 2014-10-12: 500 [IU]
  Filled 2014-10-12: qty 5

## 2014-10-12 MED ORDER — FAMOTIDINE IN NACL 20-0.9 MG/50ML-% IV SOLN
INTRAVENOUS | Status: AC
  Filled 2014-10-12: qty 50

## 2014-10-12 MED ORDER — PACLITAXEL CHEMO INJECTION 300 MG/50ML
80.0000 mg/m2 | Freq: Once | INTRAVENOUS | Status: AC
  Administered 2014-10-12: 162 mg via INTRAVENOUS
  Filled 2014-10-12: qty 27

## 2014-10-12 MED ORDER — ONDANSETRON 8 MG/50ML IVPB (CHCC)
8.0000 mg | Freq: Once | INTRAVENOUS | Status: AC
  Administered 2014-10-12: 8 mg via INTRAVENOUS

## 2014-10-12 MED ORDER — ONDANSETRON 8 MG/NS 50 ML IVPB
INTRAVENOUS | Status: AC
  Filled 2014-10-12: qty 8

## 2014-10-12 MED ORDER — SODIUM CHLORIDE 0.9 % IV SOLN
Freq: Once | INTRAVENOUS | Status: AC
  Administered 2014-10-12: 12:00:00 via INTRAVENOUS

## 2014-10-12 MED ORDER — DEXAMETHASONE SODIUM PHOSPHATE 20 MG/5ML IJ SOLN
20.0000 mg | Freq: Once | INTRAMUSCULAR | Status: AC
  Administered 2014-10-12: 20 mg via INTRAVENOUS

## 2014-10-12 MED ORDER — FAMOTIDINE IN NACL 20-0.9 MG/50ML-% IV SOLN
20.0000 mg | Freq: Once | INTRAVENOUS | Status: AC
  Administered 2014-10-12: 20 mg via INTRAVENOUS

## 2014-10-12 MED ORDER — DIPHENHYDRAMINE HCL 50 MG/ML IJ SOLN
50.0000 mg | Freq: Once | INTRAMUSCULAR | Status: AC
  Administered 2014-10-12: 50 mg via INTRAVENOUS

## 2014-10-12 MED ORDER — SODIUM CHLORIDE 0.9 % IJ SOLN
10.0000 mL | INTRAMUSCULAR | Status: DC | PRN
  Administered 2014-10-12: 10 mL
  Filled 2014-10-12: qty 10

## 2014-10-12 MED ORDER — DIPHENHYDRAMINE HCL 50 MG/ML IJ SOLN
INTRAMUSCULAR | Status: AC
  Filled 2014-10-12: qty 1

## 2014-10-12 NOTE — Telephone Encounter (Signed)
Per patient request I have scheduled 1/5 appts

## 2014-10-18 ENCOUNTER — Other Ambulatory Visit: Payer: Managed Care, Other (non HMO)

## 2014-10-23 ENCOUNTER — Other Ambulatory Visit (HOSPITAL_BASED_OUTPATIENT_CLINIC_OR_DEPARTMENT_OTHER): Payer: Managed Care, Other (non HMO)

## 2014-10-23 ENCOUNTER — Ambulatory Visit (HOSPITAL_BASED_OUTPATIENT_CLINIC_OR_DEPARTMENT_OTHER): Payer: Managed Care, Other (non HMO) | Admitting: Hematology and Oncology

## 2014-10-23 ENCOUNTER — Ambulatory Visit (HOSPITAL_BASED_OUTPATIENT_CLINIC_OR_DEPARTMENT_OTHER): Payer: Managed Care, Other (non HMO)

## 2014-10-23 ENCOUNTER — Telehealth: Payer: Self-pay | Admitting: Hematology and Oncology

## 2014-10-23 DIAGNOSIS — C50412 Malignant neoplasm of upper-outer quadrant of left female breast: Secondary | ICD-10-CM

## 2014-10-23 DIAGNOSIS — C773 Secondary and unspecified malignant neoplasm of axilla and upper limb lymph nodes: Secondary | ICD-10-CM

## 2014-10-23 DIAGNOSIS — Z5111 Encounter for antineoplastic chemotherapy: Secondary | ICD-10-CM

## 2014-10-23 DIAGNOSIS — Z170 Estrogen receptor positive status [ER+]: Secondary | ICD-10-CM

## 2014-10-23 DIAGNOSIS — D6481 Anemia due to antineoplastic chemotherapy: Secondary | ICD-10-CM

## 2014-10-23 DIAGNOSIS — C7951 Secondary malignant neoplasm of bone: Secondary | ICD-10-CM

## 2014-10-23 LAB — COMPREHENSIVE METABOLIC PANEL (CC13)
ALT: 23 U/L (ref 0–55)
ANION GAP: 8 meq/L (ref 3–11)
AST: 22 U/L (ref 5–34)
Albumin: 3 g/dL — ABNORMAL LOW (ref 3.5–5.0)
Alkaline Phosphatase: 91 U/L (ref 40–150)
BUN: 10.2 mg/dL (ref 7.0–26.0)
CALCIUM: 9.6 mg/dL (ref 8.4–10.4)
CHLORIDE: 102 meq/L (ref 98–109)
CO2: 27 meq/L (ref 22–29)
Creatinine: 0.7 mg/dL (ref 0.6–1.1)
EGFR: >90 mL/min/{1.73_m2} (ref >90)
Glucose: 118 mg/dl (ref 70–140)
POTASSIUM: 3.8 meq/L (ref 3.5–5.1)
SODIUM: 137 meq/L (ref 136–145)
TOTAL PROTEIN: 6.6 g/dL (ref 6.4–8.3)
Total Bilirubin: 0.27 mg/dL (ref 0.20–1.20)

## 2014-10-23 LAB — CBC WITH DIFFERENTIAL/PLATELET
BASO%: 0.6 % (ref 0.0–2.0)
Basophils Absolute: 0 10*3/uL (ref 0.0–0.1)
EOS%: 12.2 % — ABNORMAL HIGH (ref 0.0–7.0)
Eosinophils Absolute: 0.6 10*3/uL — ABNORMAL HIGH (ref 0.0–0.5)
HCT: 28.5 % — ABNORMAL LOW (ref 34.8–46.6)
HGB: 9 g/dL — ABNORMAL LOW (ref 11.6–15.9)
LYMPH%: 24.6 % (ref 14.0–49.7)
MCH: 27.8 pg (ref 25.1–34.0)
MCHC: 31.6 g/dL (ref 31.5–36.0)
MCV: 88 fL (ref 79.5–101.0)
MONO#: 0.8 10*3/uL (ref 0.1–0.9)
MONO%: 17.4 % — AB (ref 0.0–14.0)
NEUT%: 45.2 % (ref 38.4–76.8)
NEUTROS ABS: 2.2 10*3/uL (ref 1.5–6.5)
Platelets: 347 10*3/uL (ref 145–400)
RBC: 3.24 10*6/uL — AB (ref 3.70–5.45)
RDW: 18.1 % — ABNORMAL HIGH (ref 11.2–14.5)
WBC: 4.8 10*3/uL (ref 3.9–10.3)
lymph#: 1.2 10*3/uL (ref 0.9–3.3)

## 2014-10-23 MED ORDER — DIPHENHYDRAMINE HCL 50 MG/ML IJ SOLN
50.0000 mg | Freq: Once | INTRAMUSCULAR | Status: AC
  Administered 2014-10-23: 50 mg via INTRAVENOUS

## 2014-10-23 MED ORDER — DEXAMETHASONE SODIUM PHOSPHATE 20 MG/5ML IJ SOLN
20.0000 mg | Freq: Once | INTRAMUSCULAR | Status: AC
  Administered 2014-10-23: 20 mg via INTRAVENOUS

## 2014-10-23 MED ORDER — PACLITAXEL CHEMO INJECTION 300 MG/50ML
80.0000 mg/m2 | Freq: Once | INTRAVENOUS | Status: AC
  Administered 2014-10-23: 162 mg via INTRAVENOUS
  Filled 2014-10-23: qty 27

## 2014-10-23 MED ORDER — ONDANSETRON 8 MG/50ML IVPB (CHCC)
8.0000 mg | Freq: Once | INTRAVENOUS | Status: AC
  Administered 2014-10-23: 8 mg via INTRAVENOUS

## 2014-10-23 MED ORDER — ONDANSETRON 8 MG/NS 50 ML IVPB
INTRAVENOUS | Status: AC
  Filled 2014-10-23: qty 8

## 2014-10-23 MED ORDER — FAMOTIDINE IN NACL 20-0.9 MG/50ML-% IV SOLN
20.0000 mg | Freq: Once | INTRAVENOUS | Status: AC
  Administered 2014-10-23: 20 mg via INTRAVENOUS

## 2014-10-23 MED ORDER — FAMOTIDINE IN NACL 20-0.9 MG/50ML-% IV SOLN
INTRAVENOUS | Status: AC
  Filled 2014-10-23: qty 50

## 2014-10-23 MED ORDER — HEPARIN SOD (PORK) LOCK FLUSH 100 UNIT/ML IV SOLN
500.0000 [IU] | Freq: Once | INTRAVENOUS | Status: AC | PRN
Start: 2014-10-23 — End: 2014-10-23
  Administered 2014-10-23: 500 [IU]
  Filled 2014-10-23: qty 5

## 2014-10-23 MED ORDER — DIPHENHYDRAMINE HCL 50 MG/ML IJ SOLN
INTRAMUSCULAR | Status: AC
  Filled 2014-10-23: qty 1

## 2014-10-23 MED ORDER — DEXAMETHASONE SODIUM PHOSPHATE 20 MG/5ML IJ SOLN
INTRAMUSCULAR | Status: AC
  Filled 2014-10-23: qty 5

## 2014-10-23 MED ORDER — SODIUM CHLORIDE 0.9 % IJ SOLN
10.0000 mL | INTRAMUSCULAR | Status: DC | PRN
  Administered 2014-10-23: 10 mL
  Filled 2014-10-23: qty 10

## 2014-10-23 MED ORDER — SODIUM CHLORIDE 0.9 % IV SOLN
Freq: Once | INTRAVENOUS | Status: AC
  Administered 2014-10-23: 14:00:00 via INTRAVENOUS

## 2014-10-23 NOTE — Telephone Encounter (Signed)
, °

## 2014-10-23 NOTE — Patient Instructions (Signed)
[facility] Discharge Instructions for Patients Receiving Chemotherapy  Today you received the following chemotherapy agents: Taxol.  To help prevent nausea and vomiting after your treatment, we encourage you to take your nausea medication as prescribed.   If you develop nausea and vomiting that is not controlled by your nausea medication, call the clinic.   BELOW ARE SYMPTOMS THAT SHOULD BE REPORTED IMMEDIATELY:  *FEVER GREATER THAN 100.5 F  *CHILLS WITH OR WITHOUT FEVER  NAUSEA AND VOMITING THAT IS NOT CONTROLLED WITH YOUR NAUSEA MEDICATION  *UNUSUAL SHORTNESS OF BREATH  *UNUSUAL BRUISING OR BLEEDING  TENDERNESS IN MOUTH AND THROAT WITH OR WITHOUT PRESENCE OF ULCERS  *URINARY PROBLEMS  *BOWEL PROBLEMS  UNUSUAL RASH Items with * indicate a potential emergency and should be followed up as soon as possible.  Feel free to call the clinic you have any questions or concerns. The clinic phone number is [phone].    

## 2014-10-23 NOTE — Assessment & Plan Note (Signed)
Metastatic breast cancer in the left breast involving left axillary lymph nodes and isolated L1 vertebral metastases stage IV disease, ER percent, PR 4%, HER-2 negative ratio 1.3 Ki-67 15%  Chemotherapy toxicities:  Today is cycle 4 of weekly taxol treatments. Slight delay in treatment due to fevers responded to Levaquin treatment for acute bronchitis  Toxicities of chemotherapy: 1. Alopecia 2. Chemotherapy-induced anemia 3. Fatigue grade 1 4. Fever 100.5 after week 3 of Taxol required antibiotic treatment with Levaquin Return to clinic in 2 weeks for follow up and weekly for Taxol treatments.

## 2014-10-23 NOTE — Progress Notes (Signed)
Patient Care Team: Rachell Cipro, MD as PCP - General (Family Medicine) [facility]  Rulon Eisenmenger, MD as Consulting Physician (Hematology and Oncology) [name], MD as Consulting Physician (General Surgery) Eppie Gibson, MD as Attending Physician (Radiation Oncology)  DIAGNOSIS: Breast cancer of upper-outer quadrant of left female breast   Staging form: Breast, AJCC 7th Edition     Clinical: Stage IV (T2, N1, M1) - Signed by Rulon Eisenmenger, MD on 08/16/2014       Staging comments: Staged at breast conference 06/06/14.      Pathologic: No stage assigned - Unsigned   SUMMARY OF ONCOLOGIC HISTORY:   Breast cancer of upper-outer quadrant of left female breast   05/18/2014 Mammogram Suspicious left upper outer quadrant 5 cm segmental area of calcifications.    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   05/30/2014 Breast MRI Left Breast: 12oclock: 21 x 23 x 30 mm, Numerous  level 1 and 2 LN; Liver lesions cycts by Liver MRI   06/07/2014 Initial Biopsy Left axillary lymph node biopsy invasive ductal carcinoma ER 100%, PR 11% Ki-67 15% HER-2 negative ratio 1.41   06/13/2014 PET scan and metabolic she were left breast, hypermetabolic left axillary and left retropectoral lymph node, small lucent lesion in L1 vertebra which was biopsied and proven to be bone metastases   07/19/2014 Initial Biopsy Biopsy of L1 vertebra metastatic carcinoma breast primary, ER 100%, PR 4%, HER-2 negative ratio 1.3   07/26/2014 -  Neo-Adjuvant Chemotherapy Even though she has L1 solitary bone metastases, patient is being treated definitively with neoadjuvant chemotherapy with dose dense Adriamycin and Cytoxan to be followed by weekly Taxol x12    CHIEF COMPLIANT: Today is week 4 Taxol  INTERVAL HISTORY: Kimberly Reed is a 58 year old lady with above-mentioned history of left-sided breast cancer currently on neoadjuvant  chemotherapy. We had to withhold chemotherapy last week because of fevers and she is here today for start of cycle of treatment. She responded very well to antibiotic with Levaquin and has had no further problems with fevers or bronchitis symptoms. Being off chemotherapy briefly she has felt a lot better. Energy levels have been somewhat low but improved since last time. Denies any nausea or vomiting. Depression symptoms are stable.  REVIEW OF SYSTEMS:   Constitutional: Denies fevers, chills or abnormal weight loss Eyes: Denies blurriness of vision Ears, nose, mouth, throat, and face: Denies mucositis or sore throat Respiratory: Denies cough, dyspnea or wheezes Cardiovascular: Denies palpitation, chest discomfort or lower extremity swelling Gastrointestinal:  Denies nausea, heartburn or change in bowel habits Skin: Denies abnormal skin rashes Lymphatics: Denies new lymphadenopathy or easy bruising Neurological:Denies numbness, tingling or new weaknesses Behavioral/Psych: Mood is stable, no new changes  All other systems were reviewed with the patient and are negative.  I have reviewed the past medical history, past surgical history, social history and family history with the patient and they are unchanged from previous note.  ALLERGIES:  is allergic to other and effexor.  MEDICATIONS:  Current Outpatient Prescriptions  Medication Sig Dispense Refill  . acidophilus (RISAQUAD) CAPS capsule Take 1 capsule by mouth daily.    [name] ALPRAZolam (XANAX) 0.5 MG tablet Take 1 tablet (0.5 mg total) by mouth 3 (three) times daily as needed for anxiety. 60 tablet 3  . brimonidine-timolol (COMBIGAN) 0.2-0.5 % ophthalmic solution Place 1 drop into the right eye every 12 (twelve) hours.     . Cholecalciferol (  VITAMIN D-3) 5000 UNITS TABS Take 1 tablet by mouth daily.    [name] dexamethasone (DECADRON) 4 MG tablet Take 2 tablets (8 mg total) by mouth 2 (two) times daily with a meal. Start the day after chemotherapy  for 2 days. Take with food. 30 tablet 1  . dorzolamide (TRUSOPT) 2 % ophthalmic solution Place 1 drop into the right eye 2 (two) times daily.     . Flaxseed, Linseed, (FLAX SEED OIL PO) Take 1 capsule by mouth daily.    [name] HYDROcodone-acetaminophen (NORCO) 5-325 MG per tablet Take 1-2 tablets by mouth every 6 (six) hours as needed. 30 tablet 0  . levofloxacin (LEVAQUIN) 750 MG tablet Take 1 tablet (750 mg total) by mouth daily. 7 tablet 0  . lidocaine-prilocaine (EMLA) cream Apply 1 application topically as needed. 30 g 0  . LORazepam (ATIVAN) 1 MG tablet Take 1 tablet (1 mg total) by mouth 2 (two) times daily as needed for anxiety. 60 tablet 0  . Omega-3 Fatty Acids (OMEGA 3 PO) Take 1 capsule by mouth daily.    . ondansetron (ZOFRAN) 8 MG tablet Take 1 tablet (8 mg total) by mouth 2 (two) times daily. Start the day after chemo for 2 days. Then take as needed for nausea or vomiting. 30 tablet 1  . OVER THE COUNTER MEDICATION Take 1 capsule by mouth daily.    . prochlorperazine (COMPAZINE) 10 MG tablet Take 1 tablet (10 mg total) by mouth every 6 (six) hours as needed (Nausea or vomiting). 30 tablet 1  . thyroid (ARMOUR THYROID) 180 MG tablet Take 1 tablet (180 mg total) by mouth daily before breakfast. 90 tablet 2  . Travoprost, BAK Free, (TRAVATAN) 0.004 % SOLN ophthalmic solution Place 1 drop into the right eye at bedtime.     [name] venlafaxine XR (EFFEXOR-XR) 37.5 MG 24 hr capsule Take 1 capsule (37.5 mg total) by mouth daily with breakfast. 30 capsule 3  . WHEY PROTEIN PO Take 1 scoop by mouth daily.     No current facility-administered medications for this visit.   Facility-Administered Medications Ordered in Other Visits  Medication Dose Route Frequency Provider Last Rate Last Dose  . sodium chloride 0.9 % injection 10 mL  10 mL Intracatheter PRN Rulon Eisenmenger, MD   10 mL at 10/12/14 1359    PHYSICAL EXAMINATION: ECOG PERFORMANCE STATUS: 1 - Symptomatic but completely ambulatory  Filed  Vitals:   10/23/14 1125  BP: 104/68  Pulse: 86  Temp: 98.1 F (36.7 C)  Resp: 18   Filed Weights   10/23/14 1125  Weight: 193 lb 12.8 oz (87.907 kg)    GENERAL:alert, no distress and comfortable SKIN: skin color, texture, turgor are normal, no rashes or significant lesions EYES: normal, Conjunctiva are pink and non-injected, sclera clear OROPHARYNX:no exudate, no erythema and lips, buccal mucosa, and tongue normal  NECK: supple, thyroid normal size, non-tender, without nodularity LYMPH:  no palpable lymphadenopathy in the cervical, axillary or inguinal LUNGS: clear to auscultation and percussion with normal breathing effort HEART: regular rate & rhythm and no murmurs and no lower extremity edema ABDOMEN:abdomen soft, non-tender and normal bowel sounds Musculoskeletal:no cyanosis of digits and no clubbing  NEURO: alert & oriented x 3 with fluent speech, no focal motor/sensory deficits  LABORATORY DATA:  I have reviewed the data as listed   Chemistry      Component Value Date/Time   NA 137 10/23/2014 1049   NA 141 07/19/2014 0959   K 3.8  10/23/2014 1049   K 4.1 07/19/2014 0959   CL 105 07/19/2014 0959   CO2 27 10/23/2014 1049   CO2 25 07/19/2014 0959   BUN 10.2 10/23/2014 1049   BUN 20 07/19/2014 0959   CREATININE 0.7 10/23/2014 1049   CREATININE 0.61 07/19/2014 0959   CREATININE 0.73 03/05/2014 1044      Component Value Date/Time   CALCIUM 9.6 10/23/2014 1049   CALCIUM 9.9 07/19/2014 0959   ALKPHOS 91 10/23/2014 1049   ALKPHOS 53 06/06/2014 1229   AST 22 10/23/2014 1049   AST 14 06/06/2014 1229   ALT 23 10/23/2014 1049   ALT 12 06/06/2014 1229   BILITOT 0.27 10/23/2014 1049   BILITOT 0.3 06/06/2014 1229       Lab Results  Component Value Date   WBC 4.8 10/23/2014   HGB 9.0* 10/23/2014   HCT 28.5* 10/23/2014   MCV 88.0 10/23/2014   PLT 347 10/23/2014   NEUTROABS 2.2 10/23/2014   ASSESSMENT & PLAN:  Breast cancer of upper-outer quadrant of left female  breast Metastatic breast cancer in the left breast involving left axillary lymph nodes and isolated L1 vertebral metastases stage IV disease, ER percent, PR 4%, HER-2 negative ratio 1.3 Ki-67 15%  Chemotherapy toxicities:  Today is cycle 4 of weekly taxol treatments. Slight delay in treatment due to fevers responded to Levaquin treatment for acute bronchitis  Toxicities of chemotherapy: 1. Alopecia 2. Chemotherapy-induced anemia 3. Fatigue grade 1 4. Fever 100.5 after week 3 of Taxol required antibiotic treatment with Levaquin Return to clinic in 2 weeks for follow up and weekly for Taxol treatments.   Orders Placed This Encounter  Procedures  . CBC with Differential    Standing Status: Future     Number of Occurrences:      Standing Expiration Date: 10/23/2015  . Comprehensive metabolic panel (Cmet) - [facility]    Standing Status: Future     Number of Occurrences:      Standing Expiration Date: 10/23/2015   The patient has a good understanding of the overall plan. she agrees with it. She will call with any problems that may develop before her next visit here.   Rulon Eisenmenger, MD 10/23/2014 11:45 AM

## 2014-10-24 ENCOUNTER — Other Ambulatory Visit: Payer: Self-pay

## 2014-10-25 ENCOUNTER — Telehealth: Payer: Self-pay | Admitting: Hematology and Oncology

## 2014-10-25 NOTE — Telephone Encounter (Signed)
, °

## 2014-10-29 ENCOUNTER — Telehealth: Payer: Self-pay | Admitting: *Deleted

## 2014-10-29 NOTE — Telephone Encounter (Signed)
Per staff message and POF I have scheduled appts. Advised scheduler of appts. JMW  

## 2014-10-30 ENCOUNTER — Ambulatory Visit (HOSPITAL_BASED_OUTPATIENT_CLINIC_OR_DEPARTMENT_OTHER): Payer: Managed Care, Other (non HMO)

## 2014-10-30 ENCOUNTER — Other Ambulatory Visit: Payer: Managed Care, Other (non HMO)

## 2014-10-30 ENCOUNTER — Ambulatory Visit: Payer: Managed Care, Other (non HMO) | Admitting: Hematology and Oncology

## 2014-10-30 ENCOUNTER — Other Ambulatory Visit (HOSPITAL_BASED_OUTPATIENT_CLINIC_OR_DEPARTMENT_OTHER): Payer: Managed Care, Other (non HMO)

## 2014-10-30 DIAGNOSIS — C7951 Secondary malignant neoplasm of bone: Secondary | ICD-10-CM

## 2014-10-30 DIAGNOSIS — Z5111 Encounter for antineoplastic chemotherapy: Secondary | ICD-10-CM

## 2014-10-30 DIAGNOSIS — C50412 Malignant neoplasm of upper-outer quadrant of left female breast: Secondary | ICD-10-CM

## 2014-10-30 DIAGNOSIS — C773 Secondary and unspecified malignant neoplasm of axilla and upper limb lymph nodes: Secondary | ICD-10-CM

## 2014-10-30 LAB — CBC WITH DIFFERENTIAL/PLATELET
BASO%: 1 % (ref 0.0–2.0)
Basophils Absolute: 0.1 10*3/uL (ref 0.0–0.1)
EOS%: 9.3 % — AB (ref 0.0–7.0)
Eosinophils Absolute: 0.5 10*3/uL (ref 0.0–0.5)
HEMATOCRIT: 30.1 % — AB (ref 34.8–46.6)
HGB: 9.6 g/dL — ABNORMAL LOW (ref 11.6–15.9)
LYMPH%: 21.7 % (ref 14.0–49.7)
MCH: 27.6 pg (ref 25.1–34.0)
MCHC: 32 g/dL (ref 31.5–36.0)
MCV: 86.4 fL (ref 79.5–101.0)
MONO#: 0.5 10*3/uL (ref 0.1–0.9)
MONO%: 8.6 % (ref 0.0–14.0)
NEUT#: 3.4 10*3/uL (ref 1.5–6.5)
NEUT%: 59.4 % (ref 38.4–76.8)
Platelets: 453 10*3/uL — ABNORMAL HIGH (ref 145–400)
RBC: 3.48 10*6/uL — AB (ref 3.70–5.45)
RDW: 19.3 % — ABNORMAL HIGH (ref 11.2–14.5)
WBC: 5.7 10*3/uL (ref 3.9–10.3)
lymph#: 1.2 10*3/uL (ref 0.9–3.3)

## 2014-10-30 LAB — COMPREHENSIVE METABOLIC PANEL (CC13)
ALT: 29 U/L (ref 0–55)
AST: 19 U/L (ref 5–34)
Albumin: 3.1 g/dL — ABNORMAL LOW (ref 3.5–5.0)
Alkaline Phosphatase: 90 U/L (ref 40–150)
Anion Gap: 9 mEq/L (ref 3–11)
BUN: 15.7 mg/dL (ref 7.0–26.0)
CO2: 27 mEq/L (ref 22–29)
Calcium: 9.9 mg/dL (ref 8.4–10.4)
Chloride: 101 mEq/L (ref 98–109)
Creatinine: 0.7 mg/dL (ref 0.6–1.1)
EGFR: >90 mL/min/{1.73_m2} (ref >90)
Glucose: 112 mg/dl (ref 70–140)
Potassium: 4.2 mEq/L (ref 3.5–5.1)
Sodium: 137 mEq/L (ref 136–145)
Total Bilirubin: 0.34 mg/dL (ref 0.20–1.20)
Total Protein: 6.5 g/dL (ref 6.4–8.3)

## 2014-10-30 MED ORDER — ONDANSETRON 8 MG/50ML IVPB (CHCC)
8.0000 mg | Freq: Once | INTRAVENOUS | Status: AC
  Administered 2014-10-30: 8 mg via INTRAVENOUS

## 2014-10-30 MED ORDER — ONDANSETRON 8 MG/NS 50 ML IVPB
INTRAVENOUS | Status: AC
  Filled 2014-10-30: qty 8

## 2014-10-30 MED ORDER — DEXAMETHASONE SODIUM PHOSPHATE 20 MG/5ML IJ SOLN
INTRAMUSCULAR | Status: AC
  Filled 2014-10-30: qty 5

## 2014-10-30 MED ORDER — DIPHENHYDRAMINE HCL 50 MG/ML IJ SOLN
INTRAMUSCULAR | Status: AC
  Filled 2014-10-30: qty 1

## 2014-10-30 MED ORDER — PACLITAXEL CHEMO INJECTION 300 MG/50ML
80.0000 mg/m2 | Freq: Once | INTRAVENOUS | Status: AC
  Administered 2014-10-30: 162 mg via INTRAVENOUS
  Filled 2014-10-30: qty 27

## 2014-10-30 MED ORDER — FAMOTIDINE IN NACL 20-0.9 MG/50ML-% IV SOLN
20.0000 mg | Freq: Once | INTRAVENOUS | Status: AC
  Administered 2014-10-30: 20 mg via INTRAVENOUS

## 2014-10-30 MED ORDER — SODIUM CHLORIDE 0.9 % IJ SOLN
10.0000 mL | INTRAMUSCULAR | Status: DC | PRN
  Administered 2014-10-30: 10 mL
  Filled 2014-10-30: qty 10

## 2014-10-30 MED ORDER — DIPHENHYDRAMINE HCL 50 MG/ML IJ SOLN
50.0000 mg | Freq: Once | INTRAMUSCULAR | Status: AC
  Administered 2014-10-30: 50 mg via INTRAVENOUS

## 2014-10-30 MED ORDER — FAMOTIDINE IN NACL 20-0.9 MG/50ML-% IV SOLN
INTRAVENOUS | Status: AC
  Filled 2014-10-30: qty 50

## 2014-10-30 MED ORDER — DEXAMETHASONE SODIUM PHOSPHATE 20 MG/5ML IJ SOLN
20.0000 mg | Freq: Once | INTRAMUSCULAR | Status: AC
  Administered 2014-10-30: 20 mg via INTRAVENOUS

## 2014-10-30 MED ORDER — HEPARIN SOD (PORK) LOCK FLUSH 100 UNIT/ML IV SOLN
500.0000 [IU] | Freq: Once | INTRAVENOUS | Status: AC | PRN
  Administered 2014-10-30: 500 [IU]
  Filled 2014-10-30: qty 5

## 2014-10-30 MED ORDER — SODIUM CHLORIDE 0.9 % IV SOLN: Freq: Once | INTRAVENOUS | Status: DC

## 2014-10-30 NOTE — Patient Instructions (Signed)
[facility] Discharge Instructions for Patients Receiving Chemotherapy  Today you received the following chemotherapy agents Taxol  To help prevent nausea and vomiting after your treatment, we encourage you to take your nausea medication as directed/prescribed.   If you develop nausea and vomiting that is not controlled by your nausea medication, call the clinic.   BELOW ARE SYMPTOMS THAT SHOULD BE REPORTED IMMEDIATELY:  *FEVER GREATER THAN 100.5 F  *CHILLS WITH OR WITHOUT FEVER  NAUSEA AND VOMITING THAT IS NOT CONTROLLED WITH YOUR NAUSEA MEDICATION  *UNUSUAL SHORTNESS OF BREATH  *UNUSUAL BRUISING OR BLEEDING  TENDERNESS IN MOUTH AND THROAT WITH OR WITHOUT PRESENCE OF ULCERS  *URINARY PROBLEMS  *BOWEL PROBLEMS  UNUSUAL RASH Items with * indicate a potential emergency and should be followed up as soon as possible.  Feel free to call the clinic you have any questions or concerns. The clinic phone number is [phone].    

## 2014-11-01 ENCOUNTER — Telehealth: Payer: Self-pay | Admitting: *Deleted

## 2014-11-01 ENCOUNTER — Telehealth: Payer: Self-pay

## 2014-11-01 NOTE — Telephone Encounter (Signed)
Kimberly Reed, [place] - requesting records.  Rcvd fax approval to release info.  Records faxed.  Sent to scan.

## 2014-11-01 NOTE — Telephone Encounter (Signed)
Per patient request I have moved 2/2 appts to later in the day

## 2014-11-06 ENCOUNTER — Ambulatory Visit (HOSPITAL_BASED_OUTPATIENT_CLINIC_OR_DEPARTMENT_OTHER): Payer: Managed Care, Other (non HMO)

## 2014-11-06 ENCOUNTER — Ambulatory Visit (HOSPITAL_BASED_OUTPATIENT_CLINIC_OR_DEPARTMENT_OTHER): Payer: Managed Care, Other (non HMO) | Admitting: Hematology and Oncology

## 2014-11-06 ENCOUNTER — Telehealth: Payer: Self-pay | Admitting: Hematology and Oncology

## 2014-11-06 ENCOUNTER — Other Ambulatory Visit (HOSPITAL_BASED_OUTPATIENT_CLINIC_OR_DEPARTMENT_OTHER): Payer: Managed Care, Other (non HMO)

## 2014-11-06 VITALS — BP 109/65 | HR 83 | Temp 97.5°F | Wt 190.5 lb

## 2014-11-06 DIAGNOSIS — D6481 Anemia due to antineoplastic chemotherapy: Secondary | ICD-10-CM

## 2014-11-06 DIAGNOSIS — C7951 Secondary malignant neoplasm of bone: Secondary | ICD-10-CM

## 2014-11-06 DIAGNOSIS — Z5111 Encounter for antineoplastic chemotherapy: Secondary | ICD-10-CM

## 2014-11-06 DIAGNOSIS — R5383 Other fatigue: Secondary | ICD-10-CM

## 2014-11-06 DIAGNOSIS — C773 Secondary and unspecified malignant neoplasm of axilla and upper limb lymph nodes: Secondary | ICD-10-CM

## 2014-11-06 DIAGNOSIS — C50412 Malignant neoplasm of upper-outer quadrant of left female breast: Secondary | ICD-10-CM

## 2014-11-06 LAB — COMPREHENSIVE METABOLIC PANEL (CC13)
ALBUMIN: 3.1 g/dL — AB (ref 3.5–5.0)
ALT: 27 U/L (ref 0–55)
AST: 23 U/L (ref 5–34)
Alkaline Phosphatase: 84 U/L (ref 40–150)
Anion Gap: 7 mEq/L (ref 3–11)
BUN: 14.1 mg/dL (ref 7.0–26.0)
CHLORIDE: 100 meq/L (ref 98–109)
CO2: 27 mEq/L (ref 22–29)
Calcium: 9.6 mg/dL (ref 8.4–10.4)
Creatinine: 0.7 mg/dL (ref 0.6–1.1)
EGFR: >90 mL/min/{1.73_m2} (ref >90)
Glucose: 98 mg/dl (ref 70–140)
POTASSIUM: 4 meq/L (ref 3.5–5.1)
Sodium: 134 mEq/L — ABNORMAL LOW (ref 136–145)
Total Bilirubin: 0.4 mg/dL (ref 0.20–1.20)
Total Protein: 6.5 g/dL (ref 6.4–8.3)

## 2014-11-06 LAB — CBC WITH DIFFERENTIAL/PLATELET
BASO%: 0.2 % (ref 0.0–2.0)
Basophils Absolute: 0 10*3/uL (ref 0.0–0.1)
EOS%: 5.1 % (ref 0.0–7.0)
Eosinophils Absolute: 0.2 10*3/uL (ref 0.0–0.5)
HEMATOCRIT: 30.9 % — AB (ref 34.8–46.6)
HGB: 9.8 g/dL — ABNORMAL LOW (ref 11.6–15.9)
LYMPH%: 26.7 % (ref 14.0–49.7)
MCH: 27.6 pg (ref 25.1–34.0)
MCHC: 31.7 g/dL (ref 31.5–36.0)
MCV: 87 fL (ref 79.5–101.0)
MONO#: 0.6 10*3/uL (ref 0.1–0.9)
MONO%: 13.6 % (ref 0.0–14.0)
NEUT#: 2.5 10*3/uL (ref 1.5–6.5)
NEUT%: 54.4 % (ref 38.4–76.8)
Platelets: 359 10*3/uL (ref 145–400)
RBC: 3.55 10*6/uL — AB (ref 3.70–5.45)
RDW: 18.8 % — ABNORMAL HIGH (ref 11.2–14.5)
WBC: 4.5 10*3/uL (ref 3.9–10.3)
lymph#: 1.2 10*3/uL (ref 0.9–3.3)

## 2014-11-06 MED ORDER — FAMOTIDINE IN NACL 20-0.9 MG/50ML-% IV SOLN
INTRAVENOUS | Status: AC
  Filled 2014-11-06: qty 50

## 2014-11-06 MED ORDER — SODIUM CHLORIDE 0.9 % IJ SOLN
10.0000 mL | INTRAMUSCULAR | Status: DC | PRN
  Administered 2014-11-06: 10 mL
  Filled 2014-11-06: qty 10

## 2014-11-06 MED ORDER — DEXAMETHASONE SODIUM PHOSPHATE 20 MG/5ML IJ SOLN
20.0000 mg | Freq: Once | INTRAMUSCULAR | Status: AC
  Administered 2014-11-06: 20 mg via INTRAVENOUS

## 2014-11-06 MED ORDER — HEPARIN SOD (PORK) LOCK FLUSH 100 UNIT/ML IV SOLN
500.0000 [IU] | Freq: Once | INTRAVENOUS | Status: AC | PRN
  Administered 2014-11-06: 500 [IU]
  Filled 2014-11-06: qty 5

## 2014-11-06 MED ORDER — DIPHENHYDRAMINE HCL 50 MG/ML IJ SOLN
50.0000 mg | Freq: Once | INTRAMUSCULAR | Status: AC
  Administered 2014-11-06: 50 mg via INTRAVENOUS

## 2014-11-06 MED ORDER — ONDANSETRON 8 MG/NS 50 ML IVPB
INTRAVENOUS | Status: AC
  Filled 2014-11-06: qty 8

## 2014-11-06 MED ORDER — DIPHENHYDRAMINE HCL 50 MG/ML IJ SOLN
INTRAMUSCULAR | Status: AC
  Filled 2014-11-06: qty 1

## 2014-11-06 MED ORDER — DEXAMETHASONE SODIUM PHOSPHATE 20 MG/5ML IJ SOLN
INTRAMUSCULAR | Status: AC
  Filled 2014-11-06: qty 5

## 2014-11-06 MED ORDER — FAMOTIDINE IN NACL 20-0.9 MG/50ML-% IV SOLN
20.0000 mg | Freq: Once | INTRAVENOUS | Status: AC
  Administered 2014-11-06: 20 mg via INTRAVENOUS

## 2014-11-06 MED ORDER — SODIUM CHLORIDE 0.9 % IV SOLN
Freq: Once | INTRAVENOUS | Status: AC
  Administered 2014-11-06: 13:00:00 via INTRAVENOUS

## 2014-11-06 MED ORDER — ONDANSETRON 8 MG/50ML IVPB (CHCC)
8.0000 mg | Freq: Once | INTRAVENOUS | Status: AC
  Administered 2014-11-06: 8 mg via INTRAVENOUS

## 2014-11-06 MED ORDER — DEXTROSE 5 % IV SOLN
80.0000 mg/m2 | Freq: Once | INTRAVENOUS | Status: AC
  Administered 2014-11-06: 162 mg via INTRAVENOUS
  Filled 2014-11-06: qty 27

## 2014-11-06 NOTE — Telephone Encounter (Signed)
, °

## 2014-11-06 NOTE — Assessment & Plan Note (Signed)
Metastatic breast cancer in the left breast involving left axillary lymph nodes and isolated L1 vertebral metastases stage IV disease, ER percent, PR 4%, HER-2 negative ratio 1.3 Ki-67 15%  Current treatment: Today is cycle 6 of weekly taxol treatments.   Chemotherapy toxicities:  1. Alopecia 2. Chemotherapy-induced anemia 3. Fatigue grade 1 4. Fever 100.5 after week 3 of Taxol required antibiotic treatment with Levaquin.  Return to clinic in 2 weeks for follow up and weekly for Taxol treatments.

## 2014-11-06 NOTE — Patient Instructions (Signed)
[facility] Discharge Instructions for Patients Receiving Chemotherapy  Today you received the following chemotherapy agents Paclitaxel.  To help prevent nausea and vomiting after your treatment, we encourage you to take your nausea medication as directed.    If you develop nausea and vomiting that is not controlled by your nausea medication, call the clinic.   BELOW ARE SYMPTOMS THAT SHOULD BE REPORTED IMMEDIATELY:  *FEVER GREATER THAN 100.5 F  *CHILLS WITH OR WITHOUT FEVER  NAUSEA AND VOMITING THAT IS NOT CONTROLLED WITH YOUR NAUSEA MEDICATION  *UNUSUAL SHORTNESS OF BREATH  *UNUSUAL BRUISING OR BLEEDING  TENDERNESS IN MOUTH AND THROAT WITH OR WITHOUT PRESENCE OF ULCERS  *URINARY PROBLEMS  *BOWEL PROBLEMS  UNUSUAL RASH Items with * indicate a potential emergency and should be followed up as soon as possible.  Feel free to call the clinic you have any questions or concerns. The clinic phone number is [phone].    

## 2014-11-06 NOTE — Progress Notes (Signed)
Patient Care Team: Rachell Cipro, MD as PCP - General (Family Medicine) [facility]  Rulon Eisenmenger, MD as Consulting Physician (Hematology and Oncology) [name], MD as Consulting Physician (General Surgery) Eppie Gibson, MD as Attending Physician (Radiation Oncology)  DIAGNOSIS: Breast cancer of upper-outer quadrant of left female breast   Staging form: Breast, AJCC 7th Edition     Clinical: Stage IV (T2, N1, M1) - Signed by Rulon Eisenmenger, MD on 08/16/2014       Staging comments: Staged at breast conference 06/06/14.      Pathologic: No stage assigned - Unsigned   SUMMARY OF ONCOLOGIC HISTORY:   Breast cancer of upper-outer quadrant of left female breast   05/18/2014 Mammogram Suspicious left upper outer quadrant 5 cm segmental area of calcifications.    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   05/30/2014 Breast MRI Left Breast: 12oclock: 21 x 23 x 30 mm, Numerous  level 1 and 2 LN; Liver lesions cycts by Liver MRI   06/07/2014 Initial Biopsy Left axillary lymph node biopsy invasive ductal carcinoma ER 100%, PR 11% Ki-67 15% HER-2 negative ratio 1.41   06/13/2014 PET scan left breast activity, hypermetabolic left axillary and left retropectoral lymph node, small lucent lesion in L1 vertebra which was biopsied and proven to be bone metastases   07/19/2014 Initial Biopsy Biopsy of L1 vertebra metastatic carcinoma breast primary, ER 100%, PR 4%, HER-2 negative ratio 1.3   07/26/2014 -  Neo-Adjuvant Chemotherapy Even though she has L1 solitary bone metastases, patient is being treated definitively with neoadjuvant chemotherapy with dose dense Adriamycin and Cytoxan to be followed by weekly Taxol x12    CHIEF COMPLIANT: follow-up of breast cancer on chemotherapy today is weeks 6 of Taxol  INTERVAL HISTORY: Kimberly Reed is a 59 year old lady with above-mentioned history of left breast  cancer is currently on neoadjuvant chemotherapy. She is now receiving weekly Taxol treatments today is week 6 of treatment. She is tolerating Taxol fairly well. She denies any neuropathy symptoms. She feels about the same as before. Feels tired. Denies any fevers or chills penicillin nausea and vomiting denies any bowel disturbances.  REVIEW OF SYSTEMS:   Constitutional: Denies fevers, chills or abnormal weight loss Eyes: Denies blurriness of vision Ears, nose, mouth, throat, and face: Denies mucositis or sore throat Respiratory: Denies cough, dyspnea or wheezes Cardiovascular: Denies palpitation, chest discomfort or lower extremity swelling Gastrointestinal:  Denies nausea, heartburn or change in bowel habits Skin: Denies abnormal skin rashes Lymphatics: Denies new lymphadenopathy or easy bruising Neurological:Denies numbness, tingling or new weaknesses Behavioral/Psych: Mood is stable, no new changes  All other systems were reviewed with the patient and are negative.  I have reviewed the past medical history, past surgical history, social history and family history with the patient and they are unchanged from previous note.  ALLERGIES:  is allergic to other and effexor.  MEDICATIONS:  Current Outpatient Prescriptions  Medication Sig Dispense Refill  . acidophilus (RISAQUAD) CAPS capsule Take 1 capsule by mouth daily.    [name] ALPRAZolam (XANAX) 0.5 MG tablet Take 1 tablet (0.5 mg total) by mouth 3 (three) times daily as needed for anxiety. 60 tablet 3  . brimonidine-timolol (COMBIGAN) 0.2-0.5 % ophthalmic solution Place 1 drop into the right eye every 12 (twelve) hours.     . Cholecalciferol (VITAMIN D-3) 5000 UNITS TABS Take 1 tablet by mouth daily.    [name] dexamethasone (DECADRON) 4 MG  tablet Take 2 tablets (8 mg total) by mouth 2 (two) times daily with a meal. Start the day after chemotherapy for 2 days. Take with food. 30 tablet 1  . dorzolamide (TRUSOPT) 2 % ophthalmic solution Place 1  drop into the right eye 2 (two) times daily.     . Flaxseed, Linseed, (FLAX SEED OIL PO) Take 1 capsule by mouth daily.    [name] HYDROcodone-acetaminophen (NORCO) 5-325 MG per tablet Take 1-2 tablets by mouth every 6 (six) hours as needed. 30 tablet 0  . levofloxacin (LEVAQUIN) 750 MG tablet Take 1 tablet (750 mg total) by mouth daily. 7 tablet 0  . lidocaine-prilocaine (EMLA) cream Apply 1 application topically as needed. 30 g 0  . LORazepam (ATIVAN) 1 MG tablet Take 1 tablet (1 mg total) by mouth 2 (two) times daily as needed for anxiety. 60 tablet 0  . Omega-3 Fatty Acids (OMEGA 3 PO) Take 1 capsule by mouth daily.    . ondansetron (ZOFRAN) 8 MG tablet Take 1 tablet (8 mg total) by mouth 2 (two) times daily. Start the day after chemo for 2 days. Then take as needed for nausea or vomiting. 30 tablet 1  . OVER THE COUNTER MEDICATION Take 1 capsule by mouth daily.    . prochlorperazine (COMPAZINE) 10 MG tablet Take 1 tablet (10 mg total) by mouth every 6 (six) hours as needed (Nausea or vomiting). 30 tablet 1  . thyroid (ARMOUR THYROID) 180 MG tablet Take 1 tablet (180 mg total) by mouth daily before breakfast. 90 tablet 2  . Travoprost, BAK Free, (TRAVATAN) 0.004 % SOLN ophthalmic solution Place 1 drop into the right eye at bedtime.     [name] venlafaxine XR (EFFEXOR-XR) 37.5 MG 24 hr capsule Take 1 capsule (37.5 mg total) by mouth daily with breakfast. 30 capsule 3  . WHEY PROTEIN PO Take 1 scoop by mouth daily.     No current facility-administered medications for this visit.   Facility-Administered Medications Ordered in Other Visits  Medication Dose Route Frequency Provider Last Rate Last Dose  . sodium chloride 0.9 % injection 10 mL  10 mL Intracatheter PRN Rulon Eisenmenger, MD   10 mL at 10/12/14 1359    PHYSICAL EXAMINATION: ECOG PERFORMANCE STATUS: 1 - Symptomatic but completely ambulatory  Filed Vitals:   11/06/14 1131  BP: 109/65  Pulse: 83  Temp: 97.5 F (36.4 C)   Filed Weights    11/06/14 1131  Weight: 190 lb 8 oz (86.41 kg)    GENERAL:alert, no distress and comfortable SKIN: skin color, texture, turgor are normal, no rashes or significant lesions EYES: normal, Conjunctiva are pink and non-injected, sclera clear OROPHARYNX:no exudate, no erythema and lips, buccal mucosa, and tongue normal  NECK: supple, thyroid normal size, non-tender, without nodularity LYMPH:  no palpable lymphadenopathy in the cervical, axillary or inguinal LUNGS: clear to auscultation and percussion with normal breathing effort HEART: regular rate & rhythm and no murmurs and no lower extremity edema ABDOMEN:abdomen soft, non-tender and normal bowel sounds Musculoskeletal:no cyanosis of digits and no clubbing  NEURO: alert & oriented x 3 with fluent speech, no focal motor/sensory deficits  LABORATORY DATA:  I have reviewed the data as listed   Chemistry      Component Value Date/Time   NA 137 10/30/2014 1031   NA 141 07/19/2014 0959   K 4.2 10/30/2014 1031   K 4.1 07/19/2014 0959   CL 105 07/19/2014 0959   CO2 27 10/30/2014 1031  CO2 25 07/19/2014 0959   BUN 15.7 10/30/2014 1031   BUN 20 07/19/2014 0959   CREATININE 0.7 10/30/2014 1031   CREATININE 0.61 07/19/2014 0959   CREATININE 0.73 03/05/2014 1044      Component Value Date/Time   CALCIUM 9.9 10/30/2014 1031   CALCIUM 9.9 07/19/2014 0959   ALKPHOS 90 10/30/2014 1031   ALKPHOS 53 06/06/2014 1229   AST 19 10/30/2014 1031   AST 14 06/06/2014 1229   ALT 29 10/30/2014 1031   ALT 12 06/06/2014 1229   BILITOT 0.34 10/30/2014 1031   BILITOT 0.3 06/06/2014 1229       Lab Results  Component Value Date   WBC 4.5 11/06/2014   HGB 9.8* 11/06/2014   HCT 30.9* 11/06/2014   MCV 87.0 11/06/2014   PLT 359 11/06/2014   NEUTROABS 2.5 11/06/2014   ASSESSMENT & PLAN:  Breast cancer of upper-outer quadrant of left female breast Metastatic breast cancer in the left breast involving left axillary lymph nodes and isolated L1  vertebral metastases stage IV disease, ER 100 percent, PR 4%, HER-2 negative ratio 1.3 Ki-67 15%  Current treatment: Today is cycle 6/12 of weekly taxol treatments.   Chemotherapy toxicities:  1. Alopecia 2. Chemotherapy-induced anemia: Being monitored closely, today's hemoglobin is 9.8 3. Fatigue grade 1 4. Fever 100.5 after week 3 of Taxol required antibiotic treatment with Levaquin.  Overall plan: Once chemotherapy is complete she will undergo a PET/CT scan and a breast MRI and a discussion of the tumor board to decide on further treatments including surgery/radiation followed by antiestrogen therapy  Return to clinic in 2 weeks for follow up and weekly for Taxol treatments.   Orders Placed This Encounter  Procedures  . CBC with Differential    Standing Status: Future     Number of Occurrences:      Standing Expiration Date: 11/06/2015  . Comprehensive metabolic panel (Cmet) - [facility]    Standing Status: Future     Number of Occurrences:      Standing Expiration Date: 11/06/2015   The patient has a good understanding of the overall plan. she agrees with it. She will call with any problems that may develop before her next visit here.   Rulon Eisenmenger, MD 11/06/2014 11:55 AM

## 2014-11-13 ENCOUNTER — Other Ambulatory Visit: Payer: Self-pay | Admitting: Hematology and Oncology

## 2014-11-13 ENCOUNTER — Ambulatory Visit (HOSPITAL_BASED_OUTPATIENT_CLINIC_OR_DEPARTMENT_OTHER): Payer: Managed Care, Other (non HMO)

## 2014-11-13 ENCOUNTER — Other Ambulatory Visit (HOSPITAL_BASED_OUTPATIENT_CLINIC_OR_DEPARTMENT_OTHER): Payer: Managed Care, Other (non HMO)

## 2014-11-13 DIAGNOSIS — Z5111 Encounter for antineoplastic chemotherapy: Secondary | ICD-10-CM

## 2014-11-13 DIAGNOSIS — C7951 Secondary malignant neoplasm of bone: Secondary | ICD-10-CM

## 2014-11-13 DIAGNOSIS — C773 Secondary and unspecified malignant neoplasm of axilla and upper limb lymph nodes: Secondary | ICD-10-CM

## 2014-11-13 DIAGNOSIS — C50412 Malignant neoplasm of upper-outer quadrant of left female breast: Secondary | ICD-10-CM

## 2014-11-13 LAB — COMPREHENSIVE METABOLIC PANEL (CC13)
ALT: 27 U/L (ref 0–55)
ANION GAP: 8 meq/L (ref 3–11)
AST: 20 U/L (ref 5–34)
Albumin: 3.3 g/dL — ABNORMAL LOW (ref 3.5–5.0)
Alkaline Phosphatase: 86 U/L (ref 40–150)
BUN: 11.6 mg/dL (ref 7.0–26.0)
CALCIUM: 10.1 mg/dL (ref 8.4–10.4)
CO2: 27 mEq/L (ref 22–29)
CREATININE: 0.7 mg/dL (ref 0.6–1.1)
Chloride: 103 mEq/L (ref 98–109)
EGFR: >90 mL/min/{1.73_m2} (ref >90)
Glucose: 94 mg/dl (ref 70–140)
Potassium: 3.9 mEq/L (ref 3.5–5.1)
Sodium: 138 mEq/L (ref 136–145)
Total Bilirubin: 0.33 mg/dL (ref 0.20–1.20)
Total Protein: 6.6 g/dL (ref 6.4–8.3)

## 2014-11-13 LAB — CBC WITH DIFFERENTIAL/PLATELET
BASO%: 0.9 % (ref 0.0–2.0)
Basophils Absolute: 0 10*3/uL (ref 0.0–0.1)
EOS%: 4.3 % (ref 0.0–7.0)
Eosinophils Absolute: 0.2 10*3/uL (ref 0.0–0.5)
HCT: 30.5 % — ABNORMAL LOW (ref 34.8–46.6)
HEMOGLOBIN: 9.5 g/dL — AB (ref 11.6–15.9)
LYMPH%: 29.3 % (ref 14.0–49.7)
MCH: 27.6 pg (ref 25.1–34.0)
MCHC: 31.3 g/dL — ABNORMAL LOW (ref 31.5–36.0)
MCV: 88.2 fL (ref 79.5–101.0)
MONO#: 0.6 10*3/uL (ref 0.1–0.9)
MONO%: 15.3 % — ABNORMAL HIGH (ref 0.0–14.0)
NEUT#: 2 10*3/uL (ref 1.5–6.5)
NEUT%: 50.2 % (ref 38.4–76.8)
Platelets: 398 10*3/uL (ref 145–400)
RBC: 3.46 10*6/uL — AB (ref 3.70–5.45)
RDW: 21.1 % — ABNORMAL HIGH (ref 11.2–14.5)
WBC: 4 10*3/uL (ref 3.9–10.3)
lymph#: 1.2 10*3/uL (ref 0.9–3.3)

## 2014-11-13 MED ORDER — ONDANSETRON 8 MG/NS 50 ML IVPB
INTRAVENOUS | Status: AC
  Filled 2014-11-13: qty 8

## 2014-11-13 MED ORDER — FAMOTIDINE IN NACL 20-0.9 MG/50ML-% IV SOLN
20.0000 mg | Freq: Once | INTRAVENOUS | Status: AC
  Administered 2014-11-13: 20 mg via INTRAVENOUS

## 2014-11-13 MED ORDER — DEXAMETHASONE SODIUM PHOSPHATE 20 MG/5ML IJ SOLN
INTRAMUSCULAR | Status: AC
  Filled 2014-11-13: qty 5

## 2014-11-13 MED ORDER — DIPHENHYDRAMINE HCL 50 MG/ML IJ SOLN
INTRAMUSCULAR | Status: AC
Start: 2014-11-13 — End: 2014-11-13
  Filled 2014-11-13: qty 1

## 2014-11-13 MED ORDER — DEXAMETHASONE SODIUM PHOSPHATE 20 MG/5ML IJ SOLN
20.0000 mg | Freq: Once | INTRAMUSCULAR | Status: AC
  Administered 2014-11-13: 20 mg via INTRAVENOUS

## 2014-11-13 MED ORDER — PACLITAXEL CHEMO INJECTION 300 MG/50ML
80.0000 mg/m2 | Freq: Once | INTRAVENOUS | Status: AC
  Administered 2014-11-13: 162 mg via INTRAVENOUS
  Filled 2014-11-13: qty 27

## 2014-11-13 MED ORDER — FAMOTIDINE IN NACL 20-0.9 MG/50ML-% IV SOLN
INTRAVENOUS | Status: AC
  Filled 2014-11-13: qty 50

## 2014-11-13 MED ORDER — SODIUM CHLORIDE 0.9 % IJ SOLN
10.0000 mL | INTRAMUSCULAR | Status: DC | PRN
  Administered 2014-11-13: 10 mL
  Filled 2014-11-13: qty 10

## 2014-11-13 MED ORDER — HEPARIN SOD (PORK) LOCK FLUSH 100 UNIT/ML IV SOLN
500.0000 [IU] | Freq: Once | INTRAVENOUS | Status: AC | PRN
  Administered 2014-11-13: 500 [IU]
  Filled 2014-11-13: qty 5

## 2014-11-13 MED ORDER — DIPHENHYDRAMINE HCL 50 MG/ML IJ SOLN
50.0000 mg | Freq: Once | INTRAMUSCULAR | Status: AC
  Administered 2014-11-13: 50 mg via INTRAVENOUS

## 2014-11-13 MED ORDER — SODIUM CHLORIDE 0.9 % IV SOLN
Freq: Once | INTRAVENOUS | Status: AC
  Administered 2014-11-13: 13:00:00 via INTRAVENOUS

## 2014-11-13 MED ORDER — ONDANSETRON 8 MG/50ML IVPB (CHCC)
8.0000 mg | Freq: Once | INTRAVENOUS | Status: AC
  Administered 2014-11-13: 8 mg via INTRAVENOUS

## 2014-11-13 NOTE — Patient Instructions (Signed)
[facility] Discharge Instructions for Patients Receiving Chemotherapy  Today you received the following chemotherapy agents taxol  To help prevent nausea and vomiting after your treatment, we encourage you to take your nausea medication as directed   If you develop nausea and vomiting that is not controlled by your nausea medication, call the clinic.   BELOW ARE SYMPTOMS THAT SHOULD BE REPORTED IMMEDIATELY:  *FEVER GREATER THAN 100.5 F  *CHILLS WITH OR WITHOUT FEVER  NAUSEA AND VOMITING THAT IS NOT CONTROLLED WITH YOUR NAUSEA MEDICATION  *UNUSUAL SHORTNESS OF BREATH  *UNUSUAL BRUISING OR BLEEDING  TENDERNESS IN MOUTH AND THROAT WITH OR WITHOUT PRESENCE OF ULCERS  *URINARY PROBLEMS  *BOWEL PROBLEMS  UNUSUAL RASH Items with * indicate a potential emergency and should be followed up as soon as possible.  Feel free to call the clinic you have any questions or concerns. The clinic phone number is [phone].  

## 2014-11-19 ENCOUNTER — Encounter: Payer: Self-pay | Admitting: Hematology and Oncology

## 2014-11-19 NOTE — Progress Notes (Signed)
Faxed clinical information to [facility] @ [phone], to extend patients disability.

## 2014-11-20 ENCOUNTER — Ambulatory Visit (HOSPITAL_BASED_OUTPATIENT_CLINIC_OR_DEPARTMENT_OTHER): Payer: Managed Care, Other (non HMO) | Admitting: Hematology and Oncology

## 2014-11-20 ENCOUNTER — Telehealth: Payer: Self-pay | Admitting: *Deleted

## 2014-11-20 ENCOUNTER — Other Ambulatory Visit (HOSPITAL_BASED_OUTPATIENT_CLINIC_OR_DEPARTMENT_OTHER): Payer: Managed Care, Other (non HMO)

## 2014-11-20 ENCOUNTER — Ambulatory Visit (HOSPITAL_BASED_OUTPATIENT_CLINIC_OR_DEPARTMENT_OTHER): Payer: Managed Care, Other (non HMO)

## 2014-11-20 ENCOUNTER — Telehealth: Payer: Self-pay | Admitting: Hematology and Oncology

## 2014-11-20 VITALS — BP 93/57 | HR 74 | Temp 97.8°F | Resp 18 | Ht 65.0 in | Wt 192.2 lb

## 2014-11-20 DIAGNOSIS — C7951 Secondary malignant neoplasm of bone: Secondary | ICD-10-CM

## 2014-11-20 DIAGNOSIS — D6481 Anemia due to antineoplastic chemotherapy: Secondary | ICD-10-CM

## 2014-11-20 DIAGNOSIS — C50412 Malignant neoplasm of upper-outer quadrant of left female breast: Secondary | ICD-10-CM

## 2014-11-20 DIAGNOSIS — C773 Secondary and unspecified malignant neoplasm of axilla and upper limb lymph nodes: Secondary | ICD-10-CM

## 2014-11-20 DIAGNOSIS — Z5111 Encounter for antineoplastic chemotherapy: Secondary | ICD-10-CM

## 2014-11-20 DIAGNOSIS — Z170 Estrogen receptor positive status [ER+]: Secondary | ICD-10-CM

## 2014-11-20 LAB — CBC WITH DIFFERENTIAL/PLATELET
BASO%: 0.9 % (ref 0.0–2.0)
Basophils Absolute: 0 10*3/uL (ref 0.0–0.1)
EOS ABS: 0.1 10*3/uL (ref 0.0–0.5)
EOS%: 2.5 % (ref 0.0–7.0)
HCT: 29.9 % — ABNORMAL LOW (ref 34.8–46.6)
HGB: 9.3 g/dL — ABNORMAL LOW (ref 11.6–15.9)
LYMPH%: 35.7 % (ref 14.0–49.7)
MCH: 28 pg (ref 25.1–34.0)
MCHC: 31.1 g/dL — AB (ref 31.5–36.0)
MCV: 90.1 fL (ref 79.5–101.0)
MONO#: 0.3 10*3/uL (ref 0.1–0.9)
MONO%: 9.7 % (ref 0.0–14.0)
NEUT%: 51.2 % (ref 38.4–76.8)
NEUTROS ABS: 1.6 10*3/uL (ref 1.5–6.5)
Platelets: 356 10*3/uL (ref 145–400)
RBC: 3.32 10*6/uL — ABNORMAL LOW (ref 3.70–5.45)
RDW: 19.2 % — ABNORMAL HIGH (ref 11.2–14.5)
WBC: 3.2 10*3/uL — ABNORMAL LOW (ref 3.9–10.3)
lymph#: 1.1 10*3/uL (ref 0.9–3.3)

## 2014-11-20 LAB — COMPREHENSIVE METABOLIC PANEL (CC13)
ALT: 25 U/L (ref 0–55)
AST: 17 U/L (ref 5–34)
Albumin: 3.2 g/dL — ABNORMAL LOW (ref 3.5–5.0)
Alkaline Phosphatase: 78 U/L (ref 40–150)
Anion Gap: 7 mEq/L (ref 3–11)
BILIRUBIN TOTAL: 0.43 mg/dL (ref 0.20–1.20)
BUN: 12.9 mg/dL (ref 7.0–26.0)
CO2: 26 mEq/L (ref 22–29)
Calcium: 9.5 mg/dL (ref 8.4–10.4)
Chloride: 104 mEq/L (ref 98–109)
Creatinine: 0.6 mg/dL (ref 0.6–1.1)
EGFR: >90 mL/min/{1.73_m2} (ref >90)
GLUCOSE: 102 mg/dL (ref 70–140)
Potassium: 4 mEq/L (ref 3.5–5.1)
Sodium: 137 mEq/L (ref 136–145)
Total Protein: 6.2 g/dL — ABNORMAL LOW (ref 6.4–8.3)

## 2014-11-20 MED ORDER — DEXAMETHASONE SODIUM PHOSPHATE 20 MG/5ML IJ SOLN
INTRAMUSCULAR | Status: AC
  Filled 2014-11-20: qty 5

## 2014-11-20 MED ORDER — SODIUM CHLORIDE 0.9 % IV SOLN
Freq: Once | INTRAVENOUS | Status: AC
  Administered 2014-11-20: 11:00:00 via INTRAVENOUS

## 2014-11-20 MED ORDER — DEXAMETHASONE SODIUM PHOSPHATE 20 MG/5ML IJ SOLN
20.0000 mg | Freq: Once | INTRAMUSCULAR | Status: AC
  Administered 2014-11-20: 20 mg via INTRAVENOUS

## 2014-11-20 MED ORDER — ONDANSETRON 8 MG/NS 50 ML IVPB
INTRAVENOUS | Status: AC
  Filled 2014-11-20: qty 8

## 2014-11-20 MED ORDER — FAMOTIDINE IN NACL 20-0.9 MG/50ML-% IV SOLN
INTRAVENOUS | Status: AC
  Filled 2014-11-20: qty 50

## 2014-11-20 MED ORDER — DIPHENHYDRAMINE HCL 50 MG/ML IJ SOLN
50.0000 mg | Freq: Once | INTRAMUSCULAR | Status: AC
  Administered 2014-11-20: 50 mg via INTRAVENOUS

## 2014-11-20 MED ORDER — HEPARIN SOD (PORK) LOCK FLUSH 100 UNIT/ML IV SOLN
500.0000 [IU] | Freq: Once | INTRAVENOUS | Status: AC | PRN
  Administered 2014-11-20: 500 [IU]
  Filled 2014-11-20: qty 5

## 2014-11-20 MED ORDER — PACLITAXEL CHEMO INJECTION 300 MG/50ML
80.0000 mg/m2 | Freq: Once | INTRAVENOUS | Status: AC
  Administered 2014-11-20: 162 mg via INTRAVENOUS
  Filled 2014-11-20: qty 27

## 2014-11-20 MED ORDER — SODIUM CHLORIDE 0.9 % IJ SOLN
10.0000 mL | INTRAMUSCULAR | Status: DC | PRN
  Administered 2014-11-20: 10 mL
  Filled 2014-11-20: qty 10

## 2014-11-20 MED ORDER — DIPHENHYDRAMINE HCL 50 MG/ML IJ SOLN
INTRAMUSCULAR | Status: AC
  Filled 2014-11-20: qty 1

## 2014-11-20 MED ORDER — ONDANSETRON 8 MG/50ML IVPB (CHCC)
8.0000 mg | Freq: Once | INTRAVENOUS | Status: AC
  Administered 2014-11-20: 8 mg via INTRAVENOUS

## 2014-11-20 MED ORDER — FAMOTIDINE IN NACL 20-0.9 MG/50ML-% IV SOLN
20.0000 mg | Freq: Once | INTRAVENOUS | Status: AC
  Administered 2014-11-20: 20 mg via INTRAVENOUS

## 2014-11-20 NOTE — Telephone Encounter (Signed)
, °

## 2014-11-20 NOTE — Assessment & Plan Note (Addendum)
Metastatic breast cancer in the left breast involving left axillary lymph nodes and isolated L1 vertebral metastases stage IV disease, ER 100 percent, PR 4%, HER-2 negative ratio 1.3 Ki-67 15%  Current treatment: Completed dose dense Adriamycin Cytoxan 4. Today is cycle 8/12 of weekly taxol treatments.   Chemotherapy toxicities:  1. Alopecia 2. Chemotherapy-induced anemia: Being monitored closely, today's hemoglobin is 9.8 3. Fatigue grade 1 4. Fever 100.5 after week 3 of Taxol required antibiotic treatment with Levaquin. 5. Dizziness: I discussed with her to increase her oral water intake. She was also mildly hypotensive today.  Monitoring closely for toxicities I reviewed her blood work and there are adequate for treatment  Overall plan: Once chemotherapy is complete she will undergo a PET/CT scan and a breast MRI and a discussion of the tumor board to decide on further treatments including surgery/radiation followed by antiestrogen therapy. Patient would need to be on his ability until the end of May 2016 so that she can complete the entire treatment including surgery and adjuvant radiation treatment.  Return to clinic in 2 weeks for follow up and weekly for Taxol treatments.

## 2014-11-20 NOTE — Telephone Encounter (Signed)
Per staff message and POF I have scheduled appts. Advised scheduler of appts. JMW  

## 2014-11-20 NOTE — Progress Notes (Signed)
Patient Care Team: Rachell Cipro, MD as PCP - General (Family Medicine) [facility]  Rulon Eisenmenger, MD as Consulting Physician (Hematology and Oncology) [name], MD as Consulting Physician (General Surgery) Eppie Gibson, MD as Attending Physician (Radiation Oncology)  DIAGNOSIS: Breast cancer of upper-outer quadrant of left female breast   Staging form: Breast, AJCC 7th Edition     Clinical: Stage IV (T2, N1, M1) - Signed by Rulon Eisenmenger, MD on 08/16/2014       Staging comments: Staged at breast conference 06/06/14.      Pathologic: No stage assigned - Unsigned   SUMMARY OF ONCOLOGIC HISTORY:   Breast cancer of upper-outer quadrant of left female breast   05/18/2014 Mammogram Suspicious left upper outer quadrant 5 cm segmental area of calcifications.    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   05/30/2014 Breast MRI Left Breast: 12oclock: 21 x 23 x 30 mm, Numerous  level 1 and 2 LN; Liver lesions cycts by Liver MRI   06/07/2014 Initial Biopsy Left axillary lymph node biopsy invasive ductal carcinoma ER 100%, PR 11% Ki-67 15% HER-2 negative ratio 1.41   06/13/2014 PET scan left breast activity, hypermetabolic left axillary and left retropectoral lymph node, small lucent lesion in L1 vertebra which was biopsied and proven to be bone metastases   07/19/2014 Initial Biopsy Biopsy of L1 vertebra metastatic carcinoma breast primary, ER 100%, PR 4%, HER-2 negative ratio 1.3   07/26/2014 -  Neo-Adjuvant Chemotherapy Even though she has L1 solitary bone metastases, patient is being treated definitively with neoadjuvant chemotherapy with dose dense Adriamycin and Cytoxan to be followed by weekly Taxol x12    CHIEF COMPLIANT: Week 8/12 Taxol  INTERVAL HISTORY: Kimberly Reed is a next-year-old lady with above-mentioned history of left-sided breast cancer and L1 vertebral body metastases who is  currently on neoadjuvant chemotherapy and today she receives week 8 of Taxol treatment. She denies any neuropathy. She denies any fevers or chills or nausea or vomiting. She just feels tired most of the day. She also has difficulty with sleeping.  REVIEW OF SYSTEMS:   Constitutional: Denies fevers, chills or abnormal weight loss, alopecia, fatigue positive Eyes: Denies blurriness of vision Ears, nose, mouth, throat, and face: Denies mucositis or sore throat Respiratory: Denies cough, dyspnea or wheezes Cardiovascular: Denies palpitation, chest discomfort or lower extremity swelling Gastrointestinal:  Denies nausea, heartburn or change in bowel habits Skin: Denies abnormal skin rashes Lymphatics: Denies new lymphadenopathy or easy bruising Neurological:Denies numbness, tingling or new weaknesses Behavioral/Psych: Mood is stable, no new changes  All other systems were reviewed with the patient and are negative.  I have reviewed the past medical history, past surgical history, social history and family history with the patient and they are unchanged from previous note.  ALLERGIES:  is allergic to other and effexor.  MEDICATIONS:  Current Outpatient Prescriptions  Medication Sig Dispense Refill  . acidophilus (RISAQUAD) CAPS capsule Take 1 capsule by mouth daily.    [name] ALPRAZolam (XANAX) 0.5 MG tablet Take 1 tablet (0.5 mg total) by mouth 3 (three) times daily as needed for anxiety. 60 tablet 3  . brimonidine-timolol (COMBIGAN) 0.2-0.5 % ophthalmic solution Place 1 drop into the right eye every 12 (twelve) hours.     . Cholecalciferol (VITAMIN D-3) 5000 UNITS TABS Take 1 tablet by mouth daily.    [name] dexamethasone (DECADRON) 4 MG tablet Take 2 tablets (8 mg total) by mouth  2 (two) times daily with a meal. Start the day after chemotherapy for 2 days. Take with food. 30 tablet 1  . dorzolamide (TRUSOPT) 2 % ophthalmic solution Place 1 drop into the right eye 2 (two) times daily.     . Flaxseed,  Linseed, (FLAX SEED OIL PO) Take 1 capsule by mouth daily.    [name] HYDROcodone-acetaminophen (NORCO) 5-325 MG per tablet Take 1-2 tablets by mouth every 6 (six) hours as needed. 30 tablet 0  . lidocaine-prilocaine (EMLA) cream Apply 1 application topically as needed. 30 g 0  . LORazepam (ATIVAN) 1 MG tablet Take 1 tablet (1 mg total) by mouth 2 (two) times daily as needed for anxiety. 60 tablet 0  . Omega-3 Fatty Acids (OMEGA 3 PO) Take 1 capsule by mouth daily.    . ondansetron (ZOFRAN) 8 MG tablet Take 1 tablet (8 mg total) by mouth 2 (two) times daily. Start the day after chemo for 2 days. Then take as needed for nausea or vomiting. 30 tablet 1  . OVER THE COUNTER MEDICATION Take 1 capsule by mouth daily.    . prochlorperazine (COMPAZINE) 10 MG tablet Take 1 tablet (10 mg total) by mouth every 6 (six) hours as needed (Nausea or vomiting). 30 tablet 1  . thyroid (ARMOUR THYROID) 180 MG tablet Take 1 tablet (180 mg total) by mouth daily before breakfast. 90 tablet 2  . Travoprost, BAK Free, (TRAVATAN) 0.004 % SOLN ophthalmic solution Place 1 drop into the right eye at bedtime.     [name] venlafaxine XR (EFFEXOR-XR) 37.5 MG 24 hr capsule Take 1 capsule (37.5 mg total) by mouth daily with breakfast. 30 capsule 3  . WHEY PROTEIN PO Take 1 scoop by mouth daily.     No current facility-administered medications for this visit.   Facility-Administered Medications Ordered in Other Visits  Medication Dose Route Frequency Provider Last Rate Last Dose  . sodium chloride 0.9 % injection 10 mL  10 mL Intracatheter PRN Rulon Eisenmenger, MD   10 mL at 10/12/14 1359    PHYSICAL EXAMINATION: ECOG PERFORMANCE STATUS: 1 - Symptomatic but completely ambulatory  Filed Vitals:   11/20/14 0937  BP: 93/57  Pulse: 74  Temp: 97.8 F (36.6 C)  Resp: 18   Filed Weights   11/20/14 0937  Weight: 192 lb 3.2 oz (87.181 kg)    GENERAL:alert, no distress and comfortable SKIN: skin color, texture, turgor are normal, no  rashes or significant lesions EYES: normal, Conjunctiva are pink and non-injected, sclera clear OROPHARYNX:no exudate, no erythema and lips, buccal mucosa, and tongue normal  NECK: supple, thyroid normal size, non-tender, without nodularity LYMPH:  no palpable lymphadenopathy in the cervical, axillary or inguinal LUNGS: clear to auscultation and percussion with normal breathing effort HEART: regular rate & rhythm and no murmurs and no lower extremity edema ABDOMEN:abdomen soft, non-tender and normal bowel sounds Musculoskeletal:no cyanosis of digits and no clubbing  NEURO: alert & oriented x 3 with fluent speech, no focal motor/sensory deficits  LABORATORY DATA:  I have reviewed the data as listed   Chemistry      Component Value Date/Time   NA 137 11/20/2014 0922   NA 141 07/19/2014 0959   K 4.0 11/20/2014 0922   K 4.1 07/19/2014 0959   CL 105 07/19/2014 0959   CO2 26 11/20/2014 0922   CO2 25 07/19/2014 0959   BUN 12.9 11/20/2014 0922   BUN 20 07/19/2014 0959   CREATININE 0.6 11/20/2014 0922   CREATININE  0.61 07/19/2014 0959   CREATININE 0.73 03/05/2014 1044      Component Value Date/Time   CALCIUM 9.5 11/20/2014 0922   CALCIUM 9.9 07/19/2014 0959   ALKPHOS 78 11/20/2014 0922   ALKPHOS 53 06/06/2014 1229   AST 17 11/20/2014 0922   AST 14 06/06/2014 1229   ALT 25 11/20/2014 0922   ALT 12 06/06/2014 1229   BILITOT 0.43 11/20/2014 0922   BILITOT 0.3 06/06/2014 1229       Lab Results  Component Value Date   WBC 3.2* 11/20/2014   HGB 9.3* 11/20/2014   HCT 29.9* 11/20/2014   MCV 90.1 11/20/2014   PLT 356 11/20/2014   NEUTROABS 1.6 11/20/2014   ASSESSMENT & PLAN:  Breast cancer of upper-outer quadrant of left female breast Metastatic breast cancer in the left breast involving left axillary lymph nodes and isolated L1 vertebral metastases stage IV disease, ER 100 percent, PR 4%, HER-2 negative ratio 1.3 Ki-67 15%  Current treatment: Completed dose dense Adriamycin  Cytoxan 4. Today is cycle 8/12 of weekly taxol treatments.   Chemotherapy toxicities:  1. Alopecia 2. Chemotherapy-induced anemia: Being monitored closely, today's hemoglobin is 9.3 3. Fatigue grade 1 4. Fever 100.5 after week 3 of Taxol required antibiotic treatment with Levaquin. 5. Dizziness: I discussed with her to increase her oral water intake. She was also mildly hypotensive today.  Monitoring closely for toxicities I reviewed her blood work and there are adequate for treatment  Overall plan: Once chemotherapy is complete she will undergo a PET/CT scan and a breast MRI and a discussion of the tumor board to decide on further treatments including surgery/radiation followed by antiestrogen therapy. Patient would need to be on disability until the end of May 2016 so that she can complete the entire treatment including surgery and adjuvant radiation treatment.  Return to clinic in 2 weeks for follow up and weekly for Taxol treatments.    Orders Placed This Encounter  Procedures  . CBC with Differential    Standing Status: Future     Number of Occurrences:      Standing Expiration Date: 11/20/2015  . Comprehensive metabolic panel (Cmet) - [facility]    Standing Status: Future     Number of Occurrences:      Standing Expiration Date: 11/20/2015   The patient has a good understanding of the overall plan. she agrees with it. She will call with any problems that may develop before her next visit here.   Rulon Eisenmenger, MD

## 2014-11-20 NOTE — Patient Instructions (Signed)
[facility] Discharge Instructions for Patients Receiving Chemotherapy  Today you received the following chemotherapy agent: Taxol   To help prevent nausea and vomiting after your treatment, we encourage you to take your nausea medication as prescribed.    If you develop nausea and vomiting that is not controlled by your nausea medication, call the clinic.   BELOW ARE SYMPTOMS THAT SHOULD BE REPORTED IMMEDIATELY:  *FEVER GREATER THAN 100.5 F  *CHILLS WITH OR WITHOUT FEVER  NAUSEA AND VOMITING THAT IS NOT CONTROLLED WITH YOUR NAUSEA MEDICATION  *UNUSUAL SHORTNESS OF BREATH  *UNUSUAL BRUISING OR BLEEDING  TENDERNESS IN MOUTH AND THROAT WITH OR WITHOUT PRESENCE OF ULCERS  *URINARY PROBLEMS  *BOWEL PROBLEMS  UNUSUAL RASH Items with * indicate a potential emergency and should be followed up as soon as possible.  Feel free to call the clinic you have any questions or concerns. The clinic phone number is [phone].    

## 2014-11-22 ENCOUNTER — Telehealth: Payer: Self-pay | Admitting: *Deleted

## 2014-11-22 NOTE — Telephone Encounter (Signed)
Patient called and left a message regarding her appt on 2/23. I have added the treatment and called her with the appt

## 2014-11-26 ENCOUNTER — Encounter: Payer: Self-pay | Admitting: Hematology and Oncology

## 2014-11-26 NOTE — Progress Notes (Signed)
Insurance is paying [id number] [phone]

## 2014-11-27 ENCOUNTER — Ambulatory Visit: Payer: Managed Care, Other (non HMO)

## 2014-11-27 ENCOUNTER — Ambulatory Visit (HOSPITAL_BASED_OUTPATIENT_CLINIC_OR_DEPARTMENT_OTHER): Payer: Managed Care, Other (non HMO)

## 2014-11-27 ENCOUNTER — Other Ambulatory Visit (HOSPITAL_BASED_OUTPATIENT_CLINIC_OR_DEPARTMENT_OTHER): Payer: Managed Care, Other (non HMO)

## 2014-11-27 ENCOUNTER — Other Ambulatory Visit: Payer: Managed Care, Other (non HMO)

## 2014-11-27 DIAGNOSIS — Z5111 Encounter for antineoplastic chemotherapy: Secondary | ICD-10-CM

## 2014-11-27 DIAGNOSIS — C773 Secondary and unspecified malignant neoplasm of axilla and upper limb lymph nodes: Secondary | ICD-10-CM

## 2014-11-27 DIAGNOSIS — C50412 Malignant neoplasm of upper-outer quadrant of left female breast: Secondary | ICD-10-CM

## 2014-11-27 LAB — CBC WITH DIFFERENTIAL/PLATELET
BASO%: 1 % (ref 0.0–2.0)
BASOS ABS: 0 10*3/uL (ref 0.0–0.1)
EOS%: 3.1 % (ref 0.0–7.0)
Eosinophils Absolute: 0.1 10*3/uL (ref 0.0–0.5)
HCT: 29.2 % — ABNORMAL LOW (ref 34.8–46.6)
HEMOGLOBIN: 9.1 g/dL — AB (ref 11.6–15.9)
LYMPH%: 32 % (ref 14.0–49.7)
MCH: 28.1 pg (ref 25.1–34.0)
MCHC: 31.2 g/dL — ABNORMAL LOW (ref 31.5–36.0)
MCV: 90.1 fL (ref 79.5–101.0)
MONO#: 0.4 10*3/uL (ref 0.1–0.9)
MONO%: 11 % (ref 0.0–14.0)
NEUT#: 2 10*3/uL (ref 1.5–6.5)
NEUT%: 52.9 % (ref 38.4–76.8)
Platelets: 410 10*3/uL — ABNORMAL HIGH (ref 145–400)
RBC: 3.24 10*6/uL — AB (ref 3.70–5.45)
RDW: 22.3 % — ABNORMAL HIGH (ref 11.2–14.5)
WBC: 3.8 10*3/uL — AB (ref 3.9–10.3)
lymph#: 1.2 10*3/uL (ref 0.9–3.3)

## 2014-11-27 LAB — COMPREHENSIVE METABOLIC PANEL (CC13)
ALT: 21 U/L (ref 0–55)
AST: 17 U/L (ref 5–34)
Albumin: 3.3 g/dL — ABNORMAL LOW (ref 3.5–5.0)
Alkaline Phosphatase: 75 U/L (ref 40–150)
Anion Gap: 7 mEq/L (ref 3–11)
BUN: 11.5 mg/dL (ref 7.0–26.0)
CHLORIDE: 105 meq/L (ref 98–109)
CO2: 26 mEq/L (ref 22–29)
Calcium: 9.4 mg/dL (ref 8.4–10.4)
Creatinine: 0.6 mg/dL (ref 0.6–1.1)
Glucose: 92 mg/dl (ref 70–140)
Potassium: 4 mEq/L (ref 3.5–5.1)
SODIUM: 138 meq/L (ref 136–145)
TOTAL PROTEIN: 6.2 g/dL — AB (ref 6.4–8.3)
Total Bilirubin: 0.23 mg/dL (ref 0.20–1.20)

## 2014-11-27 MED ORDER — ONDANSETRON 8 MG/NS 50 ML IVPB
INTRAVENOUS | Status: AC
  Filled 2014-11-27: qty 8

## 2014-11-27 MED ORDER — DIPHENHYDRAMINE HCL 50 MG/ML IJ SOLN
50.0000 mg | Freq: Once | INTRAMUSCULAR | Status: AC
  Administered 2014-11-27: 50 mg via INTRAVENOUS

## 2014-11-27 MED ORDER — DIPHENHYDRAMINE HCL 50 MG/ML IJ SOLN
INTRAMUSCULAR | Status: AC
  Filled 2014-11-27: qty 1

## 2014-11-27 MED ORDER — HEPARIN SOD (PORK) LOCK FLUSH 100 UNIT/ML IV SOLN
500.0000 [IU] | Freq: Once | INTRAVENOUS | Status: AC | PRN
  Administered 2014-11-27: 500 [IU]
  Filled 2014-11-27: qty 5

## 2014-11-27 MED ORDER — SODIUM CHLORIDE 0.9 % IJ SOLN
10.0000 mL | INTRAMUSCULAR | Status: DC | PRN
  Administered 2014-11-27: 10 mL
  Filled 2014-11-27: qty 10

## 2014-11-27 MED ORDER — DEXAMETHASONE SODIUM PHOSPHATE 20 MG/5ML IJ SOLN
INTRAMUSCULAR | Status: AC
  Filled 2014-11-27: qty 5

## 2014-11-27 MED ORDER — SODIUM CHLORIDE 0.9 % IV SOLN
Freq: Once | INTRAVENOUS | Status: AC
  Administered 2014-11-27: 12:00:00 via INTRAVENOUS

## 2014-11-27 MED ORDER — DEXAMETHASONE SODIUM PHOSPHATE 20 MG/5ML IJ SOLN
20.0000 mg | Freq: Once | INTRAMUSCULAR | Status: AC
  Administered 2014-11-27: 20 mg via INTRAVENOUS

## 2014-11-27 MED ORDER — PACLITAXEL CHEMO INJECTION 300 MG/50ML
80.0000 mg/m2 | Freq: Once | INTRAVENOUS | Status: AC
  Administered 2014-11-27: 162 mg via INTRAVENOUS
  Filled 2014-11-27: qty 27

## 2014-11-27 MED ORDER — FAMOTIDINE IN NACL 20-0.9 MG/50ML-% IV SOLN
INTRAVENOUS | Status: AC
  Filled 2014-11-27: qty 50

## 2014-11-27 MED ORDER — FAMOTIDINE IN NACL 20-0.9 MG/50ML-% IV SOLN
20.0000 mg | Freq: Once | INTRAVENOUS | Status: AC
  Administered 2014-11-27: 20 mg via INTRAVENOUS

## 2014-11-27 MED ORDER — ONDANSETRON 8 MG/50ML IVPB (CHCC)
8.0000 mg | Freq: Once | INTRAVENOUS | Status: AC
  Administered 2014-11-27: 8 mg via INTRAVENOUS

## 2014-11-27 NOTE — Patient Instructions (Signed)
[facility] Discharge Instructions for Patients Receiving Chemotherapy  Today you received the following chemotherapy agents taxol  To help prevent nausea and vomiting after your treatment, we encourage you to take your nausea medication as needed   If you develop nausea and vomiting that is not controlled by your nausea medication, call the clinic.   BELOW ARE SYMPTOMS THAT SHOULD BE REPORTED IMMEDIATELY:  *FEVER GREATER THAN 100.5 F  *CHILLS WITH OR WITHOUT FEVER  NAUSEA AND VOMITING THAT IS NOT CONTROLLED WITH YOUR NAUSEA MEDICATION  *UNUSUAL SHORTNESS OF BREATH  *UNUSUAL BRUISING OR BLEEDING  TENDERNESS IN MOUTH AND THROAT WITH OR WITHOUT PRESENCE OF ULCERS  *URINARY PROBLEMS  *BOWEL PROBLEMS  UNUSUAL RASH Items with * indicate a potential emergency and should be followed up as soon as possible.  Feel free to call the clinic you have any questions or concerns. The clinic phone number is [phone].

## 2014-11-27 NOTE — Progress Notes (Signed)
Pt c/o neuropathy in her toes.  She states she informed Dr Lindi Adie of this at last office visit but the numbness in her toes is "a little worse".  No ambulation issues noted.  Per Dr Lindi Adie, ok to proceed with taxol as ordered and will reassess at next f/u visit.  Pt is aware of the plan.

## 2014-12-04 ENCOUNTER — Other Ambulatory Visit (HOSPITAL_BASED_OUTPATIENT_CLINIC_OR_DEPARTMENT_OTHER): Payer: Managed Care, Other (non HMO)

## 2014-12-04 ENCOUNTER — Ambulatory Visit (HOSPITAL_BASED_OUTPATIENT_CLINIC_OR_DEPARTMENT_OTHER): Payer: Managed Care, Other (non HMO)

## 2014-12-04 ENCOUNTER — Telehealth: Payer: Self-pay | Admitting: Hematology and Oncology

## 2014-12-04 ENCOUNTER — Ambulatory Visit (HOSPITAL_BASED_OUTPATIENT_CLINIC_OR_DEPARTMENT_OTHER): Payer: Managed Care, Other (non HMO) | Admitting: Hematology and Oncology

## 2014-12-04 VITALS — BP 107/68 | HR 82 | Temp 98.6°F | Resp 18 | Ht 65.0 in | Wt 193.9 lb

## 2014-12-04 DIAGNOSIS — C773 Secondary and unspecified malignant neoplasm of axilla and upper limb lymph nodes: Secondary | ICD-10-CM

## 2014-12-04 DIAGNOSIS — L658 Other specified nonscarring hair loss: Secondary | ICD-10-CM

## 2014-12-04 DIAGNOSIS — C7951 Secondary malignant neoplasm of bone: Secondary | ICD-10-CM

## 2014-12-04 DIAGNOSIS — R42 Dizziness and giddiness: Secondary | ICD-10-CM

## 2014-12-04 DIAGNOSIS — C50412 Malignant neoplasm of upper-outer quadrant of left female breast: Secondary | ICD-10-CM

## 2014-12-04 DIAGNOSIS — Z5111 Encounter for antineoplastic chemotherapy: Secondary | ICD-10-CM

## 2014-12-04 DIAGNOSIS — D6481 Anemia due to antineoplastic chemotherapy: Secondary | ICD-10-CM

## 2014-12-04 DIAGNOSIS — R5383 Other fatigue: Secondary | ICD-10-CM

## 2014-12-04 LAB — CBC WITH DIFFERENTIAL/PLATELET
BASO%: 1.1 % (ref 0.0–2.0)
Basophils Absolute: 0 10*3/uL (ref 0.0–0.1)
EOS%: 1.4 % (ref 0.0–7.0)
Eosinophils Absolute: 0.1 10*3/uL (ref 0.0–0.5)
HEMATOCRIT: 30.9 % — AB (ref 34.8–46.6)
HGB: 9.8 g/dL — ABNORMAL LOW (ref 11.6–15.9)
LYMPH%: 31.1 % (ref 14.0–49.7)
MCH: 28.3 pg (ref 25.1–34.0)
MCHC: 31.6 g/dL (ref 31.5–36.0)
MCV: 89.8 fL (ref 79.5–101.0)
MONO#: 0.3 10*3/uL (ref 0.1–0.9)
MONO%: 7.5 % (ref 0.0–14.0)
NEUT#: 2.1 10*3/uL (ref 1.5–6.5)
NEUT%: 58.9 % (ref 38.4–76.8)
Platelets: 408 10*3/uL — ABNORMAL HIGH (ref 145–400)
RBC: 3.45 10*6/uL — ABNORMAL LOW (ref 3.70–5.45)
RDW: 22.2 % — ABNORMAL HIGH (ref 11.2–14.5)
WBC: 3.5 10*3/uL — ABNORMAL LOW (ref 3.9–10.3)
lymph#: 1.1 10*3/uL (ref 0.9–3.3)

## 2014-12-04 LAB — COMPREHENSIVE METABOLIC PANEL (CC13)
ALBUMIN: 3.5 g/dL (ref 3.5–5.0)
ALK PHOS: 71 U/L (ref 40–150)
ALT: 29 U/L (ref 0–55)
AST: 19 U/L (ref 5–34)
Anion Gap: 7 mEq/L (ref 3–11)
BUN: 13 mg/dL (ref 7.0–26.0)
CALCIUM: 9.9 mg/dL (ref 8.4–10.4)
CHLORIDE: 103 meq/L (ref 98–109)
CO2: 25 mEq/L (ref 22–29)
Creatinine: 0.7 mg/dL (ref 0.6–1.1)
EGFR: >90 mL/min/{1.73_m2} (ref >90)
Glucose: 95 mg/dl (ref 70–140)
POTASSIUM: 3.7 meq/L (ref 3.5–5.1)
SODIUM: 134 meq/L — AB (ref 136–145)
TOTAL PROTEIN: 6.4 g/dL (ref 6.4–8.3)
Total Bilirubin: 0.32 mg/dL (ref 0.20–1.20)

## 2014-12-04 MED ORDER — FAMOTIDINE IN NACL 20-0.9 MG/50ML-% IV SOLN
INTRAVENOUS | Status: AC
  Filled 2014-12-04: qty 50

## 2014-12-04 MED ORDER — FAMOTIDINE IN NACL 20-0.9 MG/50ML-% IV SOLN
20.0000 mg | Freq: Once | INTRAVENOUS | Status: AC
  Administered 2014-12-04: 20 mg via INTRAVENOUS

## 2014-12-04 MED ORDER — ONDANSETRON 8 MG/50ML IVPB (CHCC)
8.0000 mg | Freq: Once | INTRAVENOUS | Status: AC
  Administered 2014-12-04: 8 mg via INTRAVENOUS

## 2014-12-04 MED ORDER — DEXAMETHASONE SODIUM PHOSPHATE 20 MG/5ML IJ SOLN
20.0000 mg | Freq: Once | INTRAMUSCULAR | Status: AC
  Administered 2014-12-04: 20 mg via INTRAVENOUS

## 2014-12-04 MED ORDER — DIPHENHYDRAMINE HCL 50 MG/ML IJ SOLN
INTRAMUSCULAR | Status: AC
  Filled 2014-12-04: qty 1

## 2014-12-04 MED ORDER — SODIUM CHLORIDE 0.9 % IJ SOLN
10.0000 mL | INTRAMUSCULAR | Status: DC | PRN
  Administered 2014-12-04: 10 mL
  Filled 2014-12-04: qty 10

## 2014-12-04 MED ORDER — PACLITAXEL CHEMO INJECTION 300 MG/50ML
80.0000 mg/m2 | Freq: Once | INTRAVENOUS | Status: AC
  Administered 2014-12-04: 162 mg via INTRAVENOUS
  Filled 2014-12-04: qty 27

## 2014-12-04 MED ORDER — SODIUM CHLORIDE 0.9 % IV SOLN
Freq: Once | INTRAVENOUS | Status: AC
  Administered 2014-12-04: 12:00:00 via INTRAVENOUS

## 2014-12-04 MED ORDER — ONDANSETRON 8 MG/NS 50 ML IVPB
INTRAVENOUS | Status: AC
  Filled 2014-12-04: qty 8

## 2014-12-04 MED ORDER — DIPHENHYDRAMINE HCL 50 MG/ML IJ SOLN
50.0000 mg | Freq: Once | INTRAMUSCULAR | Status: AC
  Administered 2014-12-04: 50 mg via INTRAVENOUS

## 2014-12-04 MED ORDER — HEPARIN SOD (PORK) LOCK FLUSH 100 UNIT/ML IV SOLN
500.0000 [IU] | Freq: Once | INTRAVENOUS | Status: AC | PRN
  Administered 2014-12-04: 500 [IU]
  Filled 2014-12-04: qty 5

## 2014-12-04 MED ORDER — DEXAMETHASONE SODIUM PHOSPHATE 20 MG/5ML IJ SOLN
INTRAMUSCULAR | Status: AC
  Filled 2014-12-04: qty 5

## 2014-12-04 NOTE — Addendum Note (Signed)
Addended by: Prentiss Bells on: 12/04/2014 11:34 AM   Modules accepted: Medications

## 2014-12-04 NOTE — Patient Instructions (Signed)
[facility] Discharge Instructions for Patients Receiving Chemotherapy  Today you received the following chemotherapy agents taxol  To help prevent nausea and vomiting after your treatment, we encourage you to take your nausea medication per instructions from MD as you have been doing on prior cycles   If you develop nausea and vomiting that is not controlled by your nausea medication, call the clinic.   BELOW ARE SYMPTOMS THAT SHOULD BE REPORTED IMMEDIATELY:  *FEVER GREATER THAN 100.5 F  *CHILLS WITH OR WITHOUT FEVER  NAUSEA AND VOMITING THAT IS NOT CONTROLLED WITH YOUR NAUSEA MEDICATION  *UNUSUAL SHORTNESS OF BREATH  *UNUSUAL BRUISING OR BLEEDING  TENDERNESS IN MOUTH AND THROAT WITH OR WITHOUT PRESENCE OF ULCERS  *URINARY PROBLEMS  *BOWEL PROBLEMS  UNUSUAL RASH Items with * indicate a potential emergency and should be followed up as soon as possible.  Feel free to call the clinic you have any questions or concerns. The clinic phone number is [phone].

## 2014-12-04 NOTE — Telephone Encounter (Signed)
, °

## 2014-12-04 NOTE — Progress Notes (Signed)
Patient Care Team: Rachell Cipro, MD as PCP - General (Family Medicine) [facility]  Rulon Eisenmenger, MD as Consulting Physician (Hematology and Oncology) [name], MD as Consulting Physician (General Surgery) Eppie Gibson, MD as Attending Physician (Radiation Oncology)  DIAGNOSIS: Breast cancer of upper-outer quadrant of left female breast   Staging form: Breast, AJCC 7th Edition     Clinical: Stage IV (T2, N1, M1) - Signed by Rulon Eisenmenger, MD on 08/16/2014       Staging comments: Staged at breast conference 06/06/14.      Pathologic: No stage assigned - Unsigned   SUMMARY OF ONCOLOGIC HISTORY:   Breast cancer of upper-outer quadrant of left female breast   05/18/2014 Mammogram Suspicious left upper outer quadrant 5 cm segmental area of calcifications.    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   05/30/2014 Breast MRI Left Breast: 12oclock: 21 x 23 x 30 mm, Numerous  level 1 and 2 LN; Liver lesions cycts by Liver MRI   06/07/2014 Initial Biopsy Left axillary lymph node biopsy invasive ductal carcinoma ER 100%, PR 11% Ki-67 15% HER-2 negative ratio 1.41   06/13/2014 PET scan left breast activity, hypermetabolic left axillary and left retropectoral lymph node, small lucent lesion in L1 vertebra which was biopsied and proven to be bone metastases   07/19/2014 Initial Biopsy Biopsy of L1 vertebra metastatic carcinoma breast primary, ER 100%, PR 4%, HER-2 negative ratio 1.3   07/26/2014 -  Neo-Adjuvant Chemotherapy Even though she has L1 solitary bone metastases, patient is being treated definitively with neoadjuvant chemotherapy with dose dense Adriamycin and Cytoxan to be followed by weekly Taxol x12    CHIEF COMPLIANT:  Cycle 10 weekly Taxol  INTERVAL HISTORY: Kimberly Reed is a  59 year old lady with above-mentioned history of left-sided breast cancer currently on neoadjuvant  chemotherapy. Today is week 10 out of 12 Taxol treatments.  She has been tolerating treatment fairly well without any major problems or concerns.  Denies any nausea vomiting.  Complains of  Tingling and numbness of the tips of her toes Her appetite and taste are about the same.  REVIEW OF SYSTEMS:   Constitutional: Denies fevers, chills or abnormal weight loss Eyes: Denies blurriness of vision Ears, nose, mouth, throat, and face: Denies mucositis or sore throat Respiratory: Denies cough, dyspnea or wheezes Cardiovascular: Denies palpitation, chest discomfort or lower extremity swelling Gastrointestinal:  Denies nausea, heartburn or change in bowel habits Skin: Denies abnormal skin rashes Lymphatics: Denies new lymphadenopathy or easy bruising Neurological:Denies numbness, tingling or new weaknesses Behavioral/Psych: Mood is stable, no new changes  Breast:  denies any pain or lumps or nodules in either breasts All other systems were reviewed with the patient and are negative.  I have reviewed the past medical history, past surgical history, social history and family history with the patient and they are unchanged from previous note.  ALLERGIES:  is allergic to other and effexor.  MEDICATIONS:  Current Outpatient Prescriptions  Medication Sig Dispense Refill  . acidophilus (RISAQUAD) CAPS capsule Take 1 capsule by mouth daily.    [name] ALPRAZolam (XANAX) 0.5 MG tablet Take 1 tablet (0.5 mg total) by mouth 3 (three) times daily as needed for anxiety. 60 tablet 3  . brimonidine-timolol (COMBIGAN) 0.2-0.5 % ophthalmic solution Place 1 drop into the right eye every 12 (twelve) hours.     . Cholecalciferol (VITAMIN D-3) 5000 UNITS TABS Take 1 tablet by mouth daily.    [name]  dexamethasone (DECADRON) 4 MG tablet Take 2 tablets (8 mg total) by mouth 2 (two) times daily with a meal. Start the day after chemotherapy for 2 days. Take with food. 30 tablet 1  . dorzolamide (TRUSOPT) 2 % ophthalmic solution  Place 1 drop into the right eye 2 (two) times daily.     . Flaxseed, Linseed, (FLAX SEED OIL PO) Take 1 capsule by mouth daily.    [name] HYDROcodone-acetaminophen (NORCO) 5-325 MG per tablet Take 1-2 tablets by mouth every 6 (six) hours as needed. 30 tablet 0  . lidocaine-prilocaine (EMLA) cream Apply 1 application topically as needed. 30 g 0  . LORazepam (ATIVAN) 1 MG tablet Take 1 tablet (1 mg total) by mouth 2 (two) times daily as needed for anxiety. 60 tablet 0  . Omega-3 Fatty Acids (OMEGA 3 PO) Take 1 capsule by mouth daily.    . ondansetron (ZOFRAN) 8 MG tablet Take 1 tablet (8 mg total) by mouth 2 (two) times daily. Start the day after chemo for 2 days. Then take as needed for nausea or vomiting. 30 tablet 1  . OVER THE COUNTER MEDICATION Take 1 capsule by mouth daily.    . prochlorperazine (COMPAZINE) 10 MG tablet Take 1 tablet (10 mg total) by mouth every 6 (six) hours as needed (Nausea or vomiting). 30 tablet 1  . thyroid (ARMOUR THYROID) 180 MG tablet Take 1 tablet (180 mg total) by mouth daily before breakfast. 90 tablet 2  . Travoprost, BAK Free, (TRAVATAN) 0.004 % SOLN ophthalmic solution Place 1 drop into the right eye at bedtime.     [name] venlafaxine XR (EFFEXOR-XR) 37.5 MG 24 hr capsule Take 1 capsule (37.5 mg total) by mouth daily with breakfast. 30 capsule 3  . WHEY PROTEIN PO Take 1 scoop by mouth daily.     No current facility-administered medications for this visit.   Facility-Administered Medications Ordered in Other Visits  Medication Dose Route Frequency Provider Last Rate Last Dose  . sodium chloride 0.9 % injection 10 mL  10 mL Intracatheter PRN Rulon Eisenmenger, MD   10 mL at 10/12/14 1359    PHYSICAL EXAMINATION: ECOG PERFORMANCE STATUS: 0 - Asymptomatic  Filed Vitals:   12/04/14 1109  BP: 107/68  Pulse: 82  Temp: 98.6 F (37 C)  Resp: 18   Filed Weights   12/04/14 1109  Weight: 193 lb 14.4 oz (87.952 kg)    GENERAL:alert, no distress and comfortable SKIN:  skin color, texture, turgor are normal, no rashes or significant lesions EYES: normal, Conjunctiva are pink and non-injected, sclera clear OROPHARYNX:no exudate, no erythema and lips, buccal mucosa, and tongue normal  NECK: supple, thyroid normal size, non-tender, without nodularity LYMPH:  no palpable lymphadenopathy in the cervical, axillary or inguinal LUNGS: clear to auscultation and percussion with normal breathing effort HEART: regular rate & rhythm and no murmurs and no lower extremity edema ABDOMEN:abdomen soft, non-tender and normal bowel sounds Musculoskeletal:no cyanosis of digits and no clubbing  NEURO: alert & oriented x 3 with fluent speech, no focal motor/sensory deficits   LABORATORY DATA:  I have reviewed the data as listed   Chemistry      Component Value Date/Time   NA 138 11/27/2014 1107   NA 141 07/19/2014 0959   K 4.0 11/27/2014 1107   K 4.1 07/19/2014 0959   CL 105 07/19/2014 0959   CO2 26 11/27/2014 1107   CO2 25 07/19/2014 0959   BUN 11.5 11/27/2014 1107   BUN 20  07/19/2014 0959   CREATININE 0.6 11/27/2014 1107   CREATININE 0.61 07/19/2014 0959   CREATININE 0.73 03/05/2014 1044      Component Value Date/Time   CALCIUM 9.4 11/27/2014 1107   CALCIUM 9.9 07/19/2014 0959   ALKPHOS 75 11/27/2014 1107   ALKPHOS 53 06/06/2014 1229   AST 17 11/27/2014 1107   AST 14 06/06/2014 1229   ALT 21 11/27/2014 1107   ALT 12 06/06/2014 1229   BILITOT 0.23 11/27/2014 1107   BILITOT 0.3 06/06/2014 1229       Lab Results  Component Value Date   WBC 3.5* 12/04/2014   HGB 9.8* 12/04/2014   HCT 30.9* 12/04/2014   MCV 89.8 12/04/2014   PLT 408* 12/04/2014   NEUTROABS 2.1 12/04/2014   ASSESSMENT & PLAN:  Breast cancer of upper-outer quadrant of left female breast Metastatic breast cancer in the left breast involving left axillary lymph nodes and isolated L1 vertebral metastases stage IV disease, ER 100 percent, PR 4%, HER-2 negative ratio 1.3 Ki-67 15%  Current  treatment: Completed dose dense Adriamycin Cytoxan 4. Today is cycle 10/12 of weekly taxol treatments.   Chemotherapy toxicities:  1. Alopecia 2. Chemotherapy-induced anemia: Being monitored closely, today's hemoglobin is 9.3 3. Fatigue grade 1 4. Fever 100.5 after week 3 of Taxol required antibiotic treatment with Levaquin. 5. Dizziness: I discussed with her to increase her oral water intake. She was also mildly hypotensive today.  Monitoring closely for toxicities I reviewed her blood work and there are adequate for treatment  Overall plan: Once chemotherapy is complete she will undergo a PET/CT scan and a breast MRI and a discussion of the tumor board to decide on further treatments including surgery/radiation followed by antiestrogen therapy.  Return to clinic on March 2nd for follow up and weekly for Taxol treatments.    No orders of the defined types were placed in this encounter.   The patient has a good understanding of the overall plan. she agrees with it. She will call with any problems that may develop before her next visit here.   Rulon Eisenmenger, MD

## 2014-12-04 NOTE — Assessment & Plan Note (Signed)
Metastatic breast cancer in the left breast involving left axillary lymph nodes and isolated L1 vertebral metastases stage IV disease, ER 100 percent, PR 4%, HER-2 negative ratio 1.3 Ki-67 15%  Current treatment: Completed dose dense Adriamycin Cytoxan 4. Today is cycle 10/12 of weekly taxol treatments.   Chemotherapy toxicities:  1. Alopecia 2. Chemotherapy-induced anemia: Being monitored closely, today's hemoglobin is 9.3 3. Fatigue grade 1 4. Fever 100.5 after week 3 of Taxol required antibiotic treatment with Levaquin. 5. Dizziness: I discussed with her to increase her oral water intake. She was also mildly hypotensive today.  Monitoring closely for toxicities I reviewed her blood work and there are adequate for treatment  Overall plan: Once chemotherapy is complete she will undergo a PET/CT scan and a breast MRI and a discussion of the tumor board to decide on further treatments including surgery/radiation followed by antiestrogen therapy.  Return to clinic in 2 weeks for follow up and weekly for Taxol treatments.

## 2014-12-05 ENCOUNTER — Encounter: Payer: Self-pay | Admitting: Hematology and Oncology

## 2014-12-05 NOTE — Progress Notes (Signed)
Faxed clinical information to [employer] @ [phone] to extend patient's disability

## 2014-12-11 ENCOUNTER — Other Ambulatory Visit (HOSPITAL_BASED_OUTPATIENT_CLINIC_OR_DEPARTMENT_OTHER): Payer: Managed Care, Other (non HMO)

## 2014-12-11 ENCOUNTER — Ambulatory Visit (HOSPITAL_BASED_OUTPATIENT_CLINIC_OR_DEPARTMENT_OTHER): Payer: Managed Care, Other (non HMO)

## 2014-12-11 DIAGNOSIS — C50412 Malignant neoplasm of upper-outer quadrant of left female breast: Secondary | ICD-10-CM

## 2014-12-11 DIAGNOSIS — C773 Secondary and unspecified malignant neoplasm of axilla and upper limb lymph nodes: Secondary | ICD-10-CM

## 2014-12-11 DIAGNOSIS — C7951 Secondary malignant neoplasm of bone: Secondary | ICD-10-CM

## 2014-12-11 DIAGNOSIS — Z0289 Encounter for other administrative examinations: Secondary | ICD-10-CM

## 2014-12-11 DIAGNOSIS — Z5111 Encounter for antineoplastic chemotherapy: Secondary | ICD-10-CM

## 2014-12-11 LAB — CBC WITH DIFFERENTIAL/PLATELET
BASO%: 0.6 % (ref 0.0–2.0)
BASOS ABS: 0 10*3/uL (ref 0.0–0.1)
EOS%: 1.9 % (ref 0.0–7.0)
Eosinophils Absolute: 0.1 10*3/uL (ref 0.0–0.5)
HCT: 31.3 % — ABNORMAL LOW (ref 34.8–46.6)
HGB: 10.1 g/dL — ABNORMAL LOW (ref 11.6–15.9)
LYMPH#: 1.1 10*3/uL (ref 0.9–3.3)
LYMPH%: 32.7 % (ref 14.0–49.7)
MCH: 29.1 pg (ref 25.1–34.0)
MCHC: 32.3 g/dL (ref 31.5–36.0)
MCV: 90.2 fL (ref 79.5–101.0)
MONO#: 0.3 10*3/uL (ref 0.1–0.9)
MONO%: 8 % (ref 0.0–14.0)
NEUT%: 56.8 % (ref 38.4–76.8)
NEUTROS ABS: 1.8 10*3/uL (ref 1.5–6.5)
Platelets: 366 10*3/uL (ref 145–400)
RBC: 3.47 10*6/uL — ABNORMAL LOW (ref 3.70–5.45)
RDW: 19.9 % — AB (ref 11.2–14.5)
WBC: 3.2 10*3/uL — ABNORMAL LOW (ref 3.9–10.3)

## 2014-12-11 LAB — COMPREHENSIVE METABOLIC PANEL (CC13)
ALT: 19 U/L (ref 0–55)
AST: 15 U/L (ref 5–34)
Albumin: 3.5 g/dL (ref 3.5–5.0)
Alkaline Phosphatase: 76 U/L (ref 40–150)
Anion Gap: 9 mEq/L (ref 3–11)
BILIRUBIN TOTAL: 0.23 mg/dL (ref 0.20–1.20)
BUN: 17.3 mg/dL (ref 7.0–26.0)
CO2: 25 mEq/L (ref 22–29)
Calcium: 10 mg/dL (ref 8.4–10.4)
Chloride: 105 mEq/L (ref 98–109)
Creatinine: 0.7 mg/dL (ref 0.6–1.1)
GLUCOSE: 105 mg/dL (ref 70–140)
Potassium: 4 mEq/L (ref 3.5–5.1)
Sodium: 138 mEq/L (ref 136–145)
Total Protein: 6.4 g/dL (ref 6.4–8.3)

## 2014-12-11 MED ORDER — DEXAMETHASONE SODIUM PHOSPHATE 20 MG/5ML IJ SOLN
20.0000 mg | Freq: Once | INTRAMUSCULAR | Status: AC
  Administered 2014-12-11: 20 mg via INTRAVENOUS

## 2014-12-11 MED ORDER — DIPHENHYDRAMINE HCL 50 MG/ML IJ SOLN
50.0000 mg | Freq: Once | INTRAMUSCULAR | Status: AC
  Administered 2014-12-11: 50 mg via INTRAVENOUS

## 2014-12-11 MED ORDER — SODIUM CHLORIDE 0.9 % IJ SOLN
10.0000 mL | INTRAMUSCULAR | Status: DC | PRN
  Administered 2014-12-11: 10 mL
  Filled 2014-12-11: qty 10

## 2014-12-11 MED ORDER — ONDANSETRON 8 MG/50ML IVPB (CHCC)
8.0000 mg | Freq: Once | INTRAVENOUS | Status: AC
  Administered 2014-12-11: 8 mg via INTRAVENOUS

## 2014-12-11 MED ORDER — HEPARIN SOD (PORK) LOCK FLUSH 100 UNIT/ML IV SOLN
500.0000 [IU] | Freq: Once | INTRAVENOUS | Status: AC | PRN
Start: 2014-12-11 — End: 2014-12-11
  Administered 2014-12-11: 500 [IU]
  Filled 2014-12-11: qty 5

## 2014-12-11 MED ORDER — FAMOTIDINE IN NACL 20-0.9 MG/50ML-% IV SOLN
INTRAVENOUS | Status: AC
  Filled 2014-12-11: qty 50

## 2014-12-11 MED ORDER — DEXTROSE 5 % IV SOLN
80.0000 mg/m2 | Freq: Once | INTRAVENOUS | Status: AC
  Administered 2014-12-11: 162 mg via INTRAVENOUS
  Filled 2014-12-11: qty 27

## 2014-12-11 MED ORDER — DIPHENHYDRAMINE HCL 50 MG/ML IJ SOLN
INTRAMUSCULAR | Status: AC
  Filled 2014-12-11: qty 1

## 2014-12-11 MED ORDER — DEXAMETHASONE SODIUM PHOSPHATE 20 MG/5ML IJ SOLN
INTRAMUSCULAR | Status: AC
  Filled 2014-12-11: qty 5

## 2014-12-11 MED ORDER — FAMOTIDINE IN NACL 20-0.9 MG/50ML-% IV SOLN
20.0000 mg | Freq: Once | INTRAVENOUS | Status: AC
  Administered 2014-12-11: 20 mg via INTRAVENOUS

## 2014-12-11 MED ORDER — SODIUM CHLORIDE 0.9 % IV SOLN
Freq: Once | INTRAVENOUS | Status: AC
  Administered 2014-12-11: 15:00:00 via INTRAVENOUS

## 2014-12-11 NOTE — Patient Instructions (Signed)
[facility] Discharge Instructions for Patients Receiving Chemotherapy  Today you received the following chemotherapy agent: Taxol   To help prevent nausea and vomiting after your treatment, we encourage you to take your nausea medication as prescribed.    If you develop nausea and vomiting that is not controlled by your nausea medication, call the clinic.   BELOW ARE SYMPTOMS THAT SHOULD BE REPORTED IMMEDIATELY:  *FEVER GREATER THAN 100.5 F  *CHILLS WITH OR WITHOUT FEVER  NAUSEA AND VOMITING THAT IS NOT CONTROLLED WITH YOUR NAUSEA MEDICATION  *UNUSUAL SHORTNESS OF BREATH  *UNUSUAL BRUISING OR BLEEDING  TENDERNESS IN MOUTH AND THROAT WITH OR WITHOUT PRESENCE OF ULCERS  *URINARY PROBLEMS  *BOWEL PROBLEMS  UNUSUAL RASH Items with * indicate a potential emergency and should be followed up as soon as possible.  Feel free to call the clinic you have any questions or concerns. The clinic phone number is [phone].    

## 2014-12-18 ENCOUNTER — Other Ambulatory Visit (HOSPITAL_BASED_OUTPATIENT_CLINIC_OR_DEPARTMENT_OTHER): Payer: Managed Care, Other (non HMO)

## 2014-12-18 ENCOUNTER — Ambulatory Visit (HOSPITAL_BASED_OUTPATIENT_CLINIC_OR_DEPARTMENT_OTHER): Payer: Managed Care, Other (non HMO)

## 2014-12-18 ENCOUNTER — Ambulatory Visit (HOSPITAL_BASED_OUTPATIENT_CLINIC_OR_DEPARTMENT_OTHER): Payer: Managed Care, Other (non HMO) | Admitting: Hematology and Oncology

## 2014-12-18 VITALS — BP 101/61 | HR 74 | Temp 98.4°F | Resp 18 | Ht 65.0 in | Wt 196.2 lb

## 2014-12-18 DIAGNOSIS — C50412 Malignant neoplasm of upper-outer quadrant of left female breast: Secondary | ICD-10-CM

## 2014-12-18 DIAGNOSIS — C7951 Secondary malignant neoplasm of bone: Secondary | ICD-10-CM

## 2014-12-18 DIAGNOSIS — Z5111 Encounter for antineoplastic chemotherapy: Secondary | ICD-10-CM

## 2014-12-18 DIAGNOSIS — Z170 Estrogen receptor positive status [ER+]: Secondary | ICD-10-CM

## 2014-12-18 DIAGNOSIS — D6481 Anemia due to antineoplastic chemotherapy: Secondary | ICD-10-CM

## 2014-12-18 DIAGNOSIS — I959 Hypotension, unspecified: Secondary | ICD-10-CM

## 2014-12-18 DIAGNOSIS — C773 Secondary and unspecified malignant neoplasm of axilla and upper limb lymph nodes: Secondary | ICD-10-CM

## 2014-12-18 LAB — CBC WITH DIFFERENTIAL/PLATELET
BASO%: 0.9 % (ref 0.0–2.0)
Basophils Absolute: 0 10*3/uL (ref 0.0–0.1)
EOS%: 2.1 % (ref 0.0–7.0)
Eosinophils Absolute: 0.1 10*3/uL (ref 0.0–0.5)
HCT: 31.9 % — ABNORMAL LOW (ref 34.8–46.6)
HEMOGLOBIN: 10.1 g/dL — AB (ref 11.6–15.9)
LYMPH#: 1.1 10*3/uL (ref 0.9–3.3)
LYMPH%: 31.2 % (ref 14.0–49.7)
MCH: 28.7 pg (ref 25.1–34.0)
MCHC: 31.8 g/dL (ref 31.5–36.0)
MCV: 90.3 fL (ref 79.5–101.0)
MONO#: 0.3 10*3/uL (ref 0.1–0.9)
MONO%: 9.1 % (ref 0.0–14.0)
NEUT#: 2 10*3/uL (ref 1.5–6.5)
NEUT%: 56.7 % (ref 38.4–76.8)
Platelets: 416 10*3/uL — ABNORMAL HIGH (ref 145–400)
RBC: 3.53 10*6/uL — AB (ref 3.70–5.45)
RDW: 22.8 % — AB (ref 11.2–14.5)
WBC: 3.6 10*3/uL — AB (ref 3.9–10.3)

## 2014-12-18 LAB — COMPREHENSIVE METABOLIC PANEL (CC13)
ALT: 27 U/L (ref 0–55)
ANION GAP: 7 meq/L (ref 3–11)
AST: 20 U/L (ref 5–34)
Albumin: 3.3 g/dL — ABNORMAL LOW (ref 3.5–5.0)
Alkaline Phosphatase: 75 U/L (ref 40–150)
BUN: 13.8 mg/dL (ref 7.0–26.0)
CALCIUM: 9.8 mg/dL (ref 8.4–10.4)
CO2: 26 mEq/L (ref 22–29)
CREATININE: 0.7 mg/dL (ref 0.6–1.1)
Chloride: 105 mEq/L (ref 98–109)
Glucose: 99 mg/dl (ref 70–140)
POTASSIUM: 4 meq/L (ref 3.5–5.1)
Sodium: 139 mEq/L (ref 136–145)
TOTAL PROTEIN: 6.4 g/dL (ref 6.4–8.3)
Total Bilirubin: 0.25 mg/dL (ref 0.20–1.20)

## 2014-12-18 MED ORDER — HEPARIN SOD (PORK) LOCK FLUSH 100 UNIT/ML IV SOLN
500.0000 [IU] | Freq: Once | INTRAVENOUS | Status: AC | PRN
  Administered 2014-12-18: 500 [IU]
  Filled 2014-12-18: qty 5

## 2014-12-18 MED ORDER — ONDANSETRON 8 MG/NS 50 ML IVPB
INTRAVENOUS | Status: AC
  Filled 2014-12-18: qty 8

## 2014-12-18 MED ORDER — SODIUM CHLORIDE 0.9 % IV SOLN
Freq: Once | INTRAVENOUS | Status: AC
  Administered 2014-12-18: 12:00:00 via INTRAVENOUS

## 2014-12-18 MED ORDER — FAMOTIDINE IN NACL 20-0.9 MG/50ML-% IV SOLN
20.0000 mg | Freq: Once | INTRAVENOUS | Status: AC
  Administered 2014-12-18: 20 mg via INTRAVENOUS

## 2014-12-18 MED ORDER — ONDANSETRON 8 MG/50ML IVPB (CHCC)
8.0000 mg | Freq: Once | INTRAVENOUS | Status: AC
  Administered 2014-12-18: 8 mg via INTRAVENOUS

## 2014-12-18 MED ORDER — DEXAMETHASONE SODIUM PHOSPHATE 20 MG/5ML IJ SOLN
20.0000 mg | Freq: Once | INTRAMUSCULAR | Status: AC
  Administered 2014-12-18: 20 mg via INTRAVENOUS

## 2014-12-18 MED ORDER — FAMOTIDINE IN NACL 20-0.9 MG/50ML-% IV SOLN
INTRAVENOUS | Status: AC
  Filled 2014-12-18: qty 50

## 2014-12-18 MED ORDER — DEXAMETHASONE SODIUM PHOSPHATE 20 MG/5ML IJ SOLN
INTRAMUSCULAR | Status: AC
  Filled 2014-12-18: qty 5

## 2014-12-18 MED ORDER — SODIUM CHLORIDE 0.9 % IJ SOLN
10.0000 mL | INTRAMUSCULAR | Status: DC | PRN
  Administered 2014-12-18: 10 mL
  Filled 2014-12-18: qty 10

## 2014-12-18 MED ORDER — DIPHENHYDRAMINE HCL 50 MG/ML IJ SOLN
50.0000 mg | Freq: Once | INTRAMUSCULAR | Status: AC
  Administered 2014-12-18: 50 mg via INTRAVENOUS

## 2014-12-18 MED ORDER — PACLITAXEL CHEMO INJECTION 300 MG/50ML
80.0000 mg/m2 | Freq: Once | INTRAVENOUS | Status: AC
  Administered 2014-12-18: 162 mg via INTRAVENOUS
  Filled 2014-12-18: qty 27

## 2014-12-18 MED ORDER — DIPHENHYDRAMINE HCL 50 MG/ML IJ SOLN
INTRAMUSCULAR | Status: AC
  Filled 2014-12-18: qty 1

## 2014-12-18 NOTE — Patient Instructions (Signed)
[facility] Discharge Instructions for Patients Receiving Chemotherapy  Today you received the following chemotherapy agents taxol  To help prevent nausea and vomiting after your treatment, we encourage you to take your nausea medication as directed   If you develop nausea and vomiting that is not controlled by your nausea medication, call the clinic.   BELOW ARE SYMPTOMS THAT SHOULD BE REPORTED IMMEDIATELY:  *FEVER GREATER THAN 100.5 F  *CHILLS WITH OR WITHOUT FEVER  NAUSEA AND VOMITING THAT IS NOT CONTROLLED WITH YOUR NAUSEA MEDICATION  *UNUSUAL SHORTNESS OF BREATH  *UNUSUAL BRUISING OR BLEEDING  TENDERNESS IN MOUTH AND THROAT WITH OR WITHOUT PRESENCE OF ULCERS  *URINARY PROBLEMS  *BOWEL PROBLEMS  UNUSUAL RASH Items with * indicate a potential emergency and should be followed up as soon as possible.  Feel free to call the clinic you have any questions or concerns. The clinic phone number is [phone].  

## 2014-12-18 NOTE — Assessment & Plan Note (Addendum)
Metastatic breast cancer in the left breast involving left axillary lymph nodes and isolated L1 vertebral metastases stage IV disease, ER 100 percent, PR 4%, Kimberly Reed-2 negative ratio 1.3 Ki-67 15%  Chemotherapy summary: Completed dose dense Adriamycin Cytoxan 4 started 07/26/2014. Today is cycle 12/12 of weekly taxol treatments (12/18/2014).   Summary of Chemotherapy toxicities:  1. Alopecia 2. Chemotherapy-induced anemia: Being monitored closely, today's hemoglobin is 9.3 3. Fatigue grade 1 4. Fever 100.5 after week 3 of Taxol required antibiotic treatment with Levaquin. 5. Dizziness: I discussed with Kimberly Reed to increase Kimberly Reed oral water intake. She was also mildly hypotensive today.  Monitoring closely for toxicities I reviewed Kimberly Reed blood work and there are adequate for treatment  Plan: 1. MRI breast has been scheduled for 12/25/2014 2. Tumor board presentation 3. Surgery appointments have been made to discuss surgical treatment approach is based on the post-neoadjuvant MRI 4. PET/CT scan has also been scheduled to evaluate the L1 vertebral met  Return to clinic on March 2 to discuss these scan results

## 2014-12-18 NOTE — Progress Notes (Signed)
Patient Care Team: Rachell Cipro, MD as PCP - General (Family Medicine) [facility]  Rulon Eisenmenger, MD as Consulting Physician (Hematology and Oncology) [name], MD as Consulting Physician (General Surgery) Eppie Gibson, MD as Attending Physician (Radiation Oncology)  DIAGNOSIS: Breast cancer of upper-outer quadrant of left female breast   Staging form: Breast, AJCC 7th Edition     Clinical: Stage IV (T2, N1, M1) - Signed by Rulon Eisenmenger, MD on 08/16/2014       Staging comments: Staged at breast conference 06/06/14.      Pathologic: No stage assigned - Unsigned   SUMMARY OF ONCOLOGIC HISTORY:   Breast cancer of upper-outer quadrant of left female breast   05/18/2014 Mammogram Suspicious left upper outer quadrant 5 cm segmental area of calcifications.    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   05/30/2014 Breast MRI Left Breast: 12oclock: 21 x 23 x 30 mm, Numerous  level 1 and 2 LN; Liver lesions cycts by Liver MRI   06/07/2014 Initial Biopsy Left axillary lymph node biopsy invasive ductal carcinoma ER 100%, PR 11% Ki-67 15% HER-2 negative ratio 1.41   06/13/2014 PET scan left breast activity, hypermetabolic left axillary and left retropectoral lymph node, small lucent lesion in L1 vertebra which was biopsied and proven to be bone metastases   07/19/2014 Initial Biopsy Biopsy of L1 vertebra metastatic carcinoma breast primary, ER 100%, PR 4%, HER-2 negative ratio 1.3   07/26/2014 -  Neo-Adjuvant Chemotherapy Even though she has L1 solitary bone metastases, patient is being treated definitively with neoadjuvant chemotherapy with dose dense Adriamycin and Cytoxan to be followed by weekly Taxol x12    CHIEF COMPLIANT: Taxol No. 12 today  INTERVAL HISTORY: Kimberly Reed is a 59 year old lady with above-mentioned history of left-sided breast cancer with L1 vertebral met biopsy-proven to be  breast cancer. She is currently neoadjuvant chemotherapy and today she finishes the last Taxol treatment. She has an appointment for MRI breast as well as a PET CT scan coming up and will be presented the breast tumor board. She reports that she feeling weak and tired.  REVIEW OF SYSTEMS:   Constitutional: Denies fevers, chills or abnormal weight loss Eyes: Denies blurriness of vision Ears, nose, mouth, throat, and face: Denies mucositis or sore throat Respiratory: Denies cough, dyspnea or wheezes Cardiovascular: Denies palpitation, chest discomfort or lower extremity swelling Gastrointestinal:  Denies nausea, heartburn or change in bowel habits Skin: Denies abnormal skin rashes Lymphatics: Denies new lymphadenopathy or easy bruising Neurological:Denies numbness, tingling or new weaknesses Behavioral/Psych: Mood is stable, no new changes  Breast:  Breast lump feels smallerAll other systems were reviewed with the patient and are negative.  I have reviewed the past medical history, past surgical history, social history and family history with the patient and they are unchanged from previous note.  ALLERGIES:  is allergic to other and effexor.  MEDICATIONS:  Current Outpatient Prescriptions  Medication Sig Dispense Refill  . acidophilus (RISAQUAD) CAPS capsule Take 1 capsule by mouth daily.    [name] ALPRAZolam (XANAX) 0.5 MG tablet Take 1 tablet (0.5 mg total) by mouth 3 (three) times daily as needed for anxiety. 60 tablet 3  . brimonidine-timolol (COMBIGAN) 0.2-0.5 % ophthalmic solution Place 1 drop into the right eye every 12 (twelve) hours.     [name] dexamethasone (DECADRON) 4 MG tablet Take 2 tablets (8 mg total) by mouth 2 (two) times daily with a meal.  Start the day after chemotherapy for 2 days. Take with food. 30 tablet 1  . dorzolamide (TRUSOPT) 2 % ophthalmic solution Place 1 drop into the right eye 2 (two) times daily.     . Flaxseed, Linseed, (FLAX SEED OIL PO) Take 1 capsule by mouth  daily.    [name] HYDROcodone-acetaminophen (NORCO) 5-325 MG per tablet Take 1-2 tablets by mouth every 6 (six) hours as needed. 30 tablet 0  . lidocaine-prilocaine (EMLA) cream Apply 1 application topically as needed. 30 g 0  . LORazepam (ATIVAN) 1 MG tablet Take 1 tablet (1 mg total) by mouth 2 (two) times daily as needed for anxiety. 60 tablet 0  . Omega-3 Fatty Acids (OMEGA 3 PO) Take 1 capsule by mouth daily.    . ondansetron (ZOFRAN) 8 MG tablet Take 1 tablet (8 mg total) by mouth 2 (two) times daily. Start the day after chemo for 2 days. Then take as needed for nausea or vomiting. 30 tablet 1  . OVER THE COUNTER MEDICATION Take 1 capsule by mouth daily.    . prochlorperazine (COMPAZINE) 10 MG tablet Take 1 tablet (10 mg total) by mouth every 6 (six) hours as needed (Nausea or vomiting). 30 tablet 1  . thyroid (ARMOUR THYROID) 180 MG tablet Take 1 tablet (180 mg total) by mouth daily before breakfast. 90 tablet 2  . Travoprost, BAK Free, (TRAVATAN) 0.004 % SOLN ophthalmic solution Place 1 drop into the right eye at bedtime.     [name] venlafaxine XR (EFFEXOR-XR) 37.5 MG 24 hr capsule Take 1 capsule (37.5 mg total) by mouth daily with breakfast. 30 capsule 3  . WHEY PROTEIN PO Take 1 scoop by mouth daily.     No current facility-administered medications for this visit.   Facility-Administered Medications Ordered in Other Visits  Medication Dose Route Frequency Provider Last Rate Last Dose  . sodium chloride 0.9 % injection 10 mL  10 mL Intracatheter PRN Rulon Eisenmenger, MD   10 mL at 10/12/14 1359  . sodium chloride 0.9 % injection 10 mL  10 mL Intracatheter PRN Rulon Eisenmenger, MD   10 mL at 12/11/14 1653    PHYSICAL EXAMINATION: ECOG PERFORMANCE STATUS: 1 - Symptomatic but completely ambulatory  Filed Vitals:   12/18/14 1052  BP: 101/61  Pulse: 74  Temp: 98.4 F (36.9 C)  Resp: 18   Filed Weights   12/18/14 1052  Weight: 196 lb 3.2 oz (88.996 kg)    GENERAL:alert, no distress and  comfortable SKIN: skin color, texture, turgor are normal, no rashes or significant lesions EYES: normal, Conjunctiva are pink and non-injected, sclera clear OROPHARYNX:no exudate, no erythema and lips, buccal mucosa, and tongue normal  NECK: supple, thyroid normal size, non-tender, without nodularity LYMPH:  no palpable lymphadenopathy in the cervical, axillary or inguinal LUNGS: clear to auscultation and percussion with normal breathing effort HEART: regular rate & rhythm and no murmurs and no lower extremity edema ABDOMEN:abdomen soft, non-tender and normal bowel sounds Musculoskeletal:no cyanosis of digits and no clubbing  NEURO: alert & oriented x 3 with fluent speech, no focal motor/sensory deficits   LABORATORY DATA:  I have reviewed the data as listed   Chemistry      Component Value Date/Time   NA 139 12/18/2014 1046   NA 141 07/19/2014 0959   K 4.0 12/18/2014 1046   K 4.1 07/19/2014 0959   CL 105 07/19/2014 0959   CO2 26 12/18/2014 1046   CO2 25 07/19/2014 0959  BUN 13.8 12/18/2014 1046   BUN 20 07/19/2014 0959   CREATININE 0.7 12/18/2014 1046   CREATININE 0.61 07/19/2014 0959   CREATININE 0.73 03/05/2014 1044      Component Value Date/Time   CALCIUM 9.8 12/18/2014 1046   CALCIUM 9.9 07/19/2014 0959   ALKPHOS 75 12/18/2014 1046   ALKPHOS 53 06/06/2014 1229   AST 20 12/18/2014 1046   AST 14 06/06/2014 1229   ALT 27 12/18/2014 1046   ALT 12 06/06/2014 1229   BILITOT 0.25 12/18/2014 1046   BILITOT 0.3 06/06/2014 1229       Lab Results  Component Value Date   WBC 3.6* 12/18/2014   HGB 10.1* 12/18/2014   HCT 31.9* 12/18/2014   MCV 90.3 12/18/2014   PLT 416* 12/18/2014   NEUTROABS 2.0 12/18/2014    ASSESSMENT & PLAN:  Breast cancer of upper-outer quadrant of left female breast Metastatic breast cancer in the left breast involving left axillary lymph nodes and isolated L1 vertebral metastases stage IV disease, ER 100 percent, PR 4%, HER-2 negative ratio  1.3 Ki-67 15%  Chemotherapy summary: Completed dose dense Adriamycin Cytoxan 4 started 07/26/2014. Today is cycle 12/12 of weekly taxol treatments (12/18/2014).   Summary of Chemotherapy toxicities:  1. Alopecia 2. Chemotherapy-induced anemia: Being monitored closely, today's hemoglobin is 9.3 3. Fatigue grade 1 4. Fever 100.5 after week 3 of Taxol required antibiotic treatment with Levaquin. 5. Dizziness: I discussed with her to increase her oral water intake. She was also mildly hypotensive today.  Monitoring closely for toxicities I reviewed her blood work and there are adequate for treatment  Plan: 1. MRI breast has been scheduled for 12/25/2014 2. Tumor board presentation 3. Surgery appointments have been made to discuss surgical treatment approach is based on the post-neoadjuvant MRI 4. PET/CT scan has also been scheduled to evaluate the L1 vertebral met  Return to clinic on March 2 to discuss these scan results    No orders of the defined types were placed in this encounter.   The patient has a good understanding of the overall plan. she agrees with it. She will call with any problems that may develop before her next visit here.   Rulon Eisenmenger, MD

## 2014-12-24 ENCOUNTER — Ambulatory Visit (HOSPITAL_COMMUNITY): Payer: Managed Care, Other (non HMO)

## 2014-12-25 ENCOUNTER — Encounter (HOSPITAL_COMMUNITY)
Admission: RE | Admit: 2014-12-25 | Discharge: 2014-12-25 | Disposition: A | Discharge status: Home / Self Care | Payer: Managed Care, Other (non HMO) | Source: Ambulatory Visit | Attending: Hematology and Oncology | Admitting: Hematology and Oncology

## 2014-12-25 ENCOUNTER — Other Ambulatory Visit: Payer: Self-pay | Admitting: Hematology and Oncology

## 2014-12-25 ENCOUNTER — Ambulatory Visit (HOSPITAL_COMMUNITY)
Admission: RE | Admit: 2014-12-25 | Discharge: 2014-12-25 | Disposition: A | Discharge status: Home / Self Care | Payer: Managed Care, Other (non HMO) | Source: Ambulatory Visit | Attending: Hematology and Oncology | Admitting: Hematology and Oncology

## 2014-12-25 DIAGNOSIS — C50412 Malignant neoplasm of upper-outer quadrant of left female breast: Secondary | ICD-10-CM | POA: Diagnosis present

## 2014-12-25 LAB — GLUCOSE, CAPILLARY: GLUCOSE-CAPILLARY: 95 mg/dL (ref 70–99)

## 2014-12-25 MED ORDER — GADOBENATE DIMEGLUMINE 529 MG/ML IV SOLN
20.0000 mL | Freq: Once | INTRAVENOUS | Status: AC | PRN
  Administered 2014-12-25: 20 mL via INTRAVENOUS

## 2014-12-25 MED ORDER — FLUDEOXYGLUCOSE F - 18 (FDG) INJECTION
9.9000 | Freq: Once | INTRAVENOUS | Status: AC | PRN
  Administered 2014-12-25: 9.9 via INTRAVENOUS

## 2014-12-26 ENCOUNTER — Ambulatory Visit (HOSPITAL_BASED_OUTPATIENT_CLINIC_OR_DEPARTMENT_OTHER): Payer: Managed Care, Other (non HMO) | Admitting: Hematology and Oncology

## 2014-12-26 VITALS — BP 132/83 | HR 70 | Temp 98.3°F | Resp 18 | Ht 65.0 in | Wt 196.3 lb

## 2014-12-26 DIAGNOSIS — C7951 Secondary malignant neoplasm of bone: Secondary | ICD-10-CM

## 2014-12-26 DIAGNOSIS — C50412 Malignant neoplasm of upper-outer quadrant of left female breast: Secondary | ICD-10-CM

## 2014-12-26 DIAGNOSIS — R42 Dizziness and giddiness: Secondary | ICD-10-CM

## 2014-12-26 DIAGNOSIS — D6481 Anemia due to antineoplastic chemotherapy: Secondary | ICD-10-CM

## 2014-12-26 DIAGNOSIS — Z170 Estrogen receptor positive status [ER+]: Secondary | ICD-10-CM

## 2014-12-26 DIAGNOSIS — C773 Secondary and unspecified malignant neoplasm of axilla and upper limb lymph nodes: Secondary | ICD-10-CM

## 2014-12-26 DIAGNOSIS — I959 Hypotension, unspecified: Secondary | ICD-10-CM

## 2014-12-26 DIAGNOSIS — L658 Other specified nonscarring hair loss: Secondary | ICD-10-CM

## 2014-12-26 DIAGNOSIS — R5383 Other fatigue: Secondary | ICD-10-CM

## 2014-12-26 NOTE — Assessment & Plan Note (Signed)
Metastatic breast cancer in the left breast involving left axillary lymph nodes and isolated L1 vertebral metastases stage IV disease, ER 100 percent, PR 4%, HER-2 negative ratio 1.3 Ki-67 15%  Chemotherapy summary: Completed dose dense Adriamycin Cytoxan 4 started 07/26/2014. Completed 12 weeks of Taxol by 12/18/2014.   Summary of Chemotherapy toxicities:  1. Alopecia 2. Chemotherapy-induced anemia: Being monitored closely, today's hemoglobin is 9.3 3. Fatigue grade 1 4. Fever 100.5 after week 3 of Taxol required antibiotic treatment with Levaquin. 5. Dizziness: I discussed with her to increase her oral water intake. She was also mildly hypotensive today.  Radiology review: PET CT scan and a breast MRI were performed 12/25/2014 that showed remarkable response to chemotherapy in the breast as well as the L1 vertebral body. Patient continues to have some lymphadenopathy in the axilla.  Recommendation: We discussed her in the breast tumor board and the consensus was that she would need  1. lumpectomy with axillary lymph node dissection.  2. She will be presented at the spine tumor board to discuss whether or not she should get adjuvant spine radiation in addition to breast radiation. 3. Once radiation therapy is complete we will plan on antiestrogen therapy with anastrozole 1 mg daily for 5 years  Return to clinic after surgery. 

## 2014-12-26 NOTE — Progress Notes (Signed)
Patient Care Team: Rachell Cipro, MD as PCP - General (Family Medicine) [facility]  Rulon Eisenmenger, MD as Consulting Physician (Hematology and Oncology) [name], MD as Consulting Physician (General Surgery) Eppie Gibson, MD as Attending Physician (Radiation Oncology)  DIAGNOSIS: Breast cancer of upper-outer quadrant of left female breast   Staging form: Breast, AJCC 7th Edition     Clinical: Stage IV (T2, N1, M1) - Signed by Rulon Eisenmenger, MD on 08/16/2014       Staging comments: Staged at breast conference 06/06/14.      Pathologic: No stage assigned - Unsigned   SUMMARY OF ONCOLOGIC HISTORY:   Breast cancer of upper-outer quadrant of left female breast   05/18/2014 Mammogram Suspicious left upper outer quadrant 5 cm segmental area of calcifications.    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   05/30/2014 Breast MRI Left Breast: 12oclock: 21 x 23 x 30 mm, Numerous  level 1 and 2 LN; Liver lesions cycts by Liver MRI   06/07/2014 Initial Biopsy Left axillary lymph node biopsy invasive ductal carcinoma ER 100%, PR 11% Ki-67 15% HER-2 negative ratio 1.41   06/13/2014 PET scan left breast activity, hypermetabolic left axillary and left retropectoral lymph node, small lucent lesion in L1 vertebra which was biopsied and proven to be bone metastases   07/19/2014 Initial Biopsy Biopsy of L1 vertebra metastatic carcinoma breast primary, ER 100%, PR 4%, HER-2 negative ratio 1.3   07/26/2014 - 12/18/2014 Neo-Adjuvant Chemotherapy Even though she has L1 solitary bone metastases, patient is being treated definitively with neoadjuvant chemotherapy with dose dense Adriamycin and Cytoxan to be followed by weekly Taxol x12   12/25/2014 PET scan Interval improvement in size and metabolic activity associated with left axillary lymph nodes consistent with treatment response, no hypermetabolic distant metastases,  L1 vertebral body has no activity   12/25/2014 Breast MRI No residual enhancement left breast, decreased left axillary lymphadenopathy, indicating response to treatment but lymph nodes are still present    CHIEF COMPLIANT: Follow-up to discuss MRI and CT scan  INTERVAL HISTORY: Kimberly Reed is a 59 year old lady with above-mentioned history of left-sided breast cancer who had neoadjuvant chemotherapy and is here today to review the results of the post neoadjuvant MRI and the PET CT scan. She was presented this morning in multidisciplinary breast tumor board and she is here to discuss that consensus. Overall patient has been feeling more tired but otherwise no new complaints or concerns. Denies any nausea vomiting.  REVIEW OF SYSTEMS:   Constitutional: Denies fevers, chills or abnormal weight loss Eyes: Denies blurriness of vision Ears, nose, mouth, throat, and face: Denies mucositis or sore throat Respiratory: Denies cough, dyspnea or wheezes Cardiovascular: Denies palpitation, chest discomfort or lower extremity swelling Gastrointestinal:  Denies nausea, heartburn or change in bowel habits Skin: Denies abnormal skin rashes Lymphatics: Denies new lymphadenopathy or easy bruising Neurological:Denies numbness, tingling or new weaknesses Behavioral/Psych: Mood is stable, no new changes  Breast: Breast lump is no longer palpable. All other systems were reviewed with the patient and are negative.  I have reviewed the past medical history, past surgical history, social history and family history with the patient and they are unchanged from previous note.  ALLERGIES:  is allergic to other and effexor.  MEDICATIONS:  Current Outpatient Prescriptions  Medication Sig Dispense Refill  . acidophilus (RISAQUAD) CAPS capsule Take 1 capsule by mouth daily.    [name] ALPRAZolam (XANAX) 0.5  MG tablet Take 1 tablet (0.5 mg total) by mouth 3 (three) times daily as needed for anxiety. 60 tablet 3  .  brimonidine-timolol (COMBIGAN) 0.2-0.5 % ophthalmic solution Place 1 drop into the right eye every 12 (twelve) hours.     [name] dexamethasone (DECADRON) 4 MG tablet Take 2 tablets (8 mg total) by mouth 2 (two) times daily with a meal. Start the day after chemotherapy for 2 days. Take with food. 30 tablet 1  . dorzolamide (TRUSOPT) 2 % ophthalmic solution Place 1 drop into the right eye 2 (two) times daily.     . Flaxseed, Linseed, (FLAX SEED OIL PO) Take 1 capsule by mouth daily.    [name] HYDROcodone-acetaminophen (NORCO) 5-325 MG per tablet Take 1-2 tablets by mouth every 6 (six) hours as needed. 30 tablet 0  . lidocaine-prilocaine (EMLA) cream Apply 1 application topically as needed. 30 g 0  . LORazepam (ATIVAN) 1 MG tablet Take 1 tablet (1 mg total) by mouth 2 (two) times daily as needed for anxiety. 60 tablet 0  . Omega-3 Fatty Acids (OMEGA 3 PO) Take 1 capsule by mouth daily.    . ondansetron (ZOFRAN) 8 MG tablet Take 1 tablet (8 mg total) by mouth 2 (two) times daily. Start the day after chemo for 2 days. Then take as needed for nausea or vomiting. 30 tablet 1  . OVER THE COUNTER MEDICATION Take 1 capsule by mouth daily.    . prochlorperazine (COMPAZINE) 10 MG tablet Take 1 tablet (10 mg total) by mouth every 6 (six) hours as needed (Nausea or vomiting). 30 tablet 1  . thyroid (ARMOUR THYROID) 180 MG tablet Take 1 tablet (180 mg total) by mouth daily before breakfast. 90 tablet 2  . Travoprost, BAK Free, (TRAVATAN) 0.004 % SOLN ophthalmic solution Place 1 drop into the right eye at bedtime.     [name] venlafaxine XR (EFFEXOR-XR) 37.5 MG 24 hr capsule Take 1 capsule (37.5 mg total) by mouth daily with breakfast. 30 capsule 3  . WHEY PROTEIN PO Take 1 scoop by mouth daily.     No current facility-administered medications for this visit.   Facility-Administered Medications Ordered in Other Visits  Medication Dose Route Frequency Provider Last Rate Last Dose  . sodium chloride 0.9 % injection 10 mL  10 mL  Intracatheter PRN Rulon Eisenmenger, MD   10 mL at 10/12/14 1359  . sodium chloride 0.9 % injection 10 mL  10 mL Intracatheter PRN Rulon Eisenmenger, MD   10 mL at 12/11/14 1653    PHYSICAL EXAMINATION: ECOG PERFORMANCE STATUS: 1 - Symptomatic but completely ambulatory  Filed Vitals:   12/26/14 1448  BP: 132/83  Pulse: 70  Temp: 98.3 F (36.8 C)  Resp: 18   Filed Weights   12/26/14 1448  Weight: 196 lb 4.8 oz (89.041 kg)    GENERAL:alert, no distress and comfortable SKIN: skin color, texture, turgor are normal, no rashes or significant lesions EYES: normal, Conjunctiva are pink and non-injected, sclera clear OROPHARYNX:no exudate, no erythema and lips, buccal mucosa, and tongue normal  NECK: supple, thyroid normal size, non-tender, without nodularity LYMPH:  no palpable lymphadenopathy in the cervical, axillary or inguinal LUNGS: clear to auscultation and percussion with normal breathing effort HEART: regular rate & rhythm and no murmurs and no lower extremity edema ABDOMEN:abdomen soft, non-tender and normal bowel sounds Musculoskeletal:no cyanosis of digits and no clubbing  NEURO: alert & oriented x 3 with fluent speech, no focal motor/sensory deficits  LABORATORY DATA:  I have reviewed the data as listed   Chemistry      Component Value Date/Time   NA 139 12/18/2014 1046   NA 141 07/19/2014 0959   K 4.0 12/18/2014 1046   K 4.1 07/19/2014 0959   CL 105 07/19/2014 0959   CO2 26 12/18/2014 1046   CO2 25 07/19/2014 0959   BUN 13.8 12/18/2014 1046   BUN 20 07/19/2014 0959   CREATININE 0.7 12/18/2014 1046   CREATININE 0.61 07/19/2014 0959   CREATININE 0.73 03/05/2014 1044      Component Value Date/Time   CALCIUM 9.8 12/18/2014 1046   CALCIUM 9.9 07/19/2014 0959   ALKPHOS 75 12/18/2014 1046   ALKPHOS 53 06/06/2014 1229   AST 20 12/18/2014 1046   AST 14 06/06/2014 1229   ALT 27 12/18/2014 1046   ALT 12 06/06/2014 1229   BILITOT 0.25 12/18/2014 1046   BILITOT 0.3  06/06/2014 1229       Lab Results  Component Value Date   WBC 3.6* 12/18/2014   HGB 10.1* 12/18/2014   HCT 31.9* 12/18/2014   MCV 90.3 12/18/2014   PLT 416* 12/18/2014   NEUTROABS 2.0 12/18/2014     RADIOGRAPHIC STUDIES: I have personally reviewed the radiology reports and agreed with their findings. 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 Metastatic invasive ductal carcinoma subsequently diagnosed within a left axillary lymph node. Probable L1 metastatic osseous lesion at prior imaging. Liver cysts at subsequent dedicated abdominal MRI for further evaluation for T2 hyperintense hepatic masses. Assess response to neoadjuvant chemotherapy.  LABS:  Not obtained  EXAM: BILATERAL BREAST MRI WITH AND WITHOUT CONTRAST  TECHNIQUE: Multiplanar, multisequence MR images of both breasts were obtained prior to and following the intravenous administration of 20 ml of Multihance.  THREE-DIMENSIONAL MR IMAGE RENDERING ON INDEPENDENT WORKSTATION:  Three-dimensional MR images were rendered by post-processing of the original MR data on an independent workstation. The three-dimensional MR images were interpreted, and findings are reported in the following complete MRI report for this study. Three dimensional images were evaluated at the independent DynaCad workstation  COMPARISON:  Previous exam(s).  FINDINGS: Breast composition: b.  Scattered fibroglandular tissue.  Background parenchymal enhancement: Minimal  Right breast: No mass or abnormal enhancement.  Left breast: No residual enhancement is seen adjacent to clip artifact indicating the location of previously biopsy proven left upper outer quadrant malignancy.  Lymph nodes: Decreased left axillary lymphadenopathy is identified, with multiple enlarged lymph nodes with cortical thickening reidentified, largest 1.1 cm, decreased from 1.8 cm  previously.  Ancillary findings: T2 hyperintense hepatic lesions previously demonstrated to represent cysts are stable. Right-sided Port-A-Cath is in place.  IMPRESSION: No residual measurable enhancement within the left breast upper outer quadrant at the site of previously biopsy proven breast cancer. This indicates good response to treatment.  Decreased left axillary lymphadenopathy, also indicating response to treatment, although without complete resolution of lymphadenopathy today.  No new evidence for malignancy in the right breast or elsewhere in the left breast.  RECOMMENDATION: Treatment plan  BI-RADS CATEGORY  6: Known biopsy-proven malignancy.   Electronically Signed   By: Conchita Paris M.D.   On: 12/25/2014 13:39   Nm Pet Image Restag (ps) Skull Base To Thigh  12/25/2014   CLINICAL DATA:  Subsequent treatment strategy for breast cancer.  EXAM: NUCLEAR MEDICINE PET SKULL BASE TO THIGH  TECHNIQUE: 9.9 mCi F-18 FDG was injected intravenously. Full-ring PET imaging was performed from the skull base to thigh after the radiotracer. CT data was obtained and used for attenuation correction and anatomic localization.  FASTING BLOOD GLUCOSE:  Value: 95 mg/dl  COMPARISON:  PET-CT 03/26/5614.  FINDINGS: NECK  No hypermetabolic cervical lymph nodes are identified.There are no lesions of the pharyngeal mucosal space.  CHEST  Interval improvement in previously demonstrated left axillary lymphadenopathy. The largest remaining node measures 1.3 x 1.0 cm on image 50 with an SUV max of 1.98. There are no hypermetabolic mediastinal, hilar or internal mammary lymph nodes. There is no suspicious pulmonary activity. Small subpleural nodules in the right lung are stable (images 26 and 33). No suspicious pulmonary nodules. Right IJ Port-A-Cath tip at the SVC right atrial junction.  ABDOMEN/PELVIS  There is no hypermetabolic activity within the liver, adrenal glands, spleen or pancreas. There are stable low-density hepatic  lesions devoid of metabolic activity, consistent with cysts. The largest is located posteriorly in the right lobe, measuring 3.0 cm on image 91. Contrast material is present within the renal collecting systems and bladder from preceding breast MRI. The bladder is mildly trabeculated with small diverticula bilaterally. Mildly heterogeneous activity at the level of the anus is likely physiologic.  SKELETON  There is no hypermetabolic activity to suggest osseous metastatic disease. No residual abnormal activity within the L1 vertebral body demonstrated. There are scattered sclerotic lesions in the pelvis which are stable, likely bone islands. No evidence of pathologic fracture or epidural tumor.  IMPRESSION: 1. Interval improvement in size and metabolic activity associated with left axillary lymph nodes consistent with response to interval treatment. 2. No hypermetabolic distant metastases demonstrated. 3. Trabeculated bladder.   Electronically Signed   By: Carey Bullocks M.D.   On: 12/25/2014 15:21     ASSESSMENT & PLAN:  Breast cancer of upper-outer quadrant of left female breast Metastatic breast cancer in the left breast involving left axillary lymph nodes and isolated L1 vertebral metastases stage IV disease, ER 100 percent, PR 4%, HER-2 negative ratio 1.3 Ki-67 15%  Chemotherapy summary: Completed dose dense Adriamycin Cytoxan 4 started 07/26/2014. Completed 12 weeks of Taxol by 12/18/2014.   Summary of Chemotherapy toxicities:  1. Alopecia 2. Chemotherapy-induced anemia: Being monitored closely, today's hemoglobin is 9.3 3. Fatigue grade 1 4. Fever 100.5 after week 3 of Taxol required antibiotic treatment with Levaquin. 5. Dizziness: I discussed with her to increase her oral water intake. She was also mildly hypotensive today.  Radiology review: PET CT scan and a breast MRI were performed 12/25/2014 that showed remarkable response to chemotherapy in the breast as well as the L1 vertebral  body. Patient continues to have some lymphadenopathy in the axilla.  Recommendation: We discussed her in the breast tumor board and the consensus was that she would need  1. lumpectomy with axillary lymph node dissection.  2. She will be presented at the spine tumor board to discuss whether or not she should get adjuvant spine radiation in addition to breast radiation. 3. Once radiation therapy is complete we will plan on antiestrogen therapy with anastrozole 1 mg daily for 5 years  Return to clinic after surgery.  No orders of the defined types were placed in this encounter.   The patient has a good understanding of the overall plan. she agrees with it. She will call with any problems that may develop before her next visit here.   Sabas Sous, MD

## 2015-01-02 ENCOUNTER — Encounter: Payer: Self-pay | Admitting: *Deleted

## 2015-01-04 ENCOUNTER — Other Ambulatory Visit (INDEPENDENT_AMBULATORY_CARE_PROVIDER_SITE_OTHER): Payer: Self-pay | Admitting: General Surgery

## 2015-01-04 ENCOUNTER — Encounter: Payer: Self-pay | Admitting: Radiation Therapy

## 2015-01-04 DIAGNOSIS — C50912 Malignant neoplasm of unspecified site of left female breast: Secondary | ICD-10-CM

## 2015-01-04 NOTE — Progress Notes (Signed)
Pt. Was discussed in the brain and spine conference on 12/31/14. The group felt that it is reasonable to treat her Lumbar met with SRS after her breast surgery is completed.   Will await referral back to Dr. Isidore Moos in Radiation Oncology for her breast and spinal radiation after her breast surgery is completed.   [facility] R.T. (R) (T) Radiation [facility] Procedures [place] [phone] Office [phone] Pager [phone] Fax [email or web address]

## 2015-01-08 ENCOUNTER — Other Ambulatory Visit (INDEPENDENT_AMBULATORY_CARE_PROVIDER_SITE_OTHER): Payer: Self-pay | Admitting: General Surgery

## 2015-01-08 ENCOUNTER — Telehealth: Payer: Self-pay | Admitting: *Deleted

## 2015-01-08 DIAGNOSIS — C50912 Malignant neoplasm of unspecified site of left female breast: Secondary | ICD-10-CM

## 2015-01-08 NOTE — Telephone Encounter (Signed)
Spoke with patient to schedule follow up with Dr. Lindi Adie post op.  Patient states she can't have surgery on 3/22 because she has no one to take her or pick her up till after 3/30.  Informed her that I would call [facility] and let them know and they can call and work out a date. Will follow up with her once we know a surgery date to get a follow up with Dr. Lindi Adie.

## 2015-01-11 ENCOUNTER — Telehealth: Payer: Self-pay | Admitting: *Deleted

## 2015-01-11 NOTE — Telephone Encounter (Signed)
Spoke with patient and confirmed appointment for 4/13/ at [place]. Encouraged to call with any needs or concerns.

## 2015-01-15 ENCOUNTER — Telehealth: Payer: Self-pay | Admitting: Hematology and Oncology

## 2015-01-15 ENCOUNTER — Other Ambulatory Visit: Payer: Self-pay

## 2015-01-15 NOTE — Telephone Encounter (Signed)
Per 3/22 pof patient already has an appt in place 4/13

## 2015-01-16 ENCOUNTER — Other Ambulatory Visit (INDEPENDENT_AMBULATORY_CARE_PROVIDER_SITE_OTHER): Payer: Managed Care, Other (non HMO)

## 2015-01-16 DIAGNOSIS — Z0000 Encounter for general adult medical examination without abnormal findings: Secondary | ICD-10-CM

## 2015-01-16 LAB — LIPID PANEL
CHOL/HDL RATIO: 5
CHOLESTEROL: 248 mg/dL — AB (ref 0–200)
HDL: 53.1 mg/dL (ref >39.00)
LDL Cholesterol: 176 mg/dL — ABNORMAL HIGH (ref 0–99)
NonHDL: 194.9
TRIGLYCERIDES: 96 mg/dL (ref 0.0–149.0)
VLDL: 19.2 mg/dL (ref 0.0–40.0)

## 2015-01-16 LAB — HEPATIC FUNCTION PANEL
ALK PHOS: 86 U/L (ref 39–117)
ALT: 12 U/L (ref 0–35)
AST: 15 U/L (ref 0–37)
Albumin: 4.1 g/dL (ref 3.5–5.2)
BILIRUBIN DIRECT: 0.1 mg/dL (ref 0.0–0.3)
Total Bilirubin: 0.4 mg/dL (ref 0.2–1.2)
Total Protein: 6.8 g/dL (ref 6.0–8.3)

## 2015-01-16 LAB — BASIC METABOLIC PANEL
BUN: 19 mg/dL (ref 6–23)
CALCIUM: 10.1 mg/dL (ref 8.4–10.5)
CO2: 30 mEq/L (ref 19–32)
CREATININE: 0.71 mg/dL (ref 0.40–1.20)
Chloride: 104 mEq/L (ref 96–112)
GFR: 89.05 mL/min (ref >60.00)
Glucose, Bld: 100 mg/dL — ABNORMAL HIGH (ref 70–99)
Potassium: 4.9 mEq/L (ref 3.5–5.1)
SODIUM: 140 meq/L (ref 135–145)

## 2015-01-16 LAB — CBC WITH DIFFERENTIAL/PLATELET
Basophils Absolute: 0 10*3/uL (ref 0.0–0.1)
Basophils Relative: 0.6 % (ref 0.0–3.0)
EOS PCT: 3.8 % (ref 0.0–5.0)
Eosinophils Absolute: 0.2 10*3/uL (ref 0.0–0.7)
HCT: 34 % — ABNORMAL LOW (ref 36.0–46.0)
Hemoglobin: 11.3 g/dL — ABNORMAL LOW (ref 12.0–15.0)
LYMPHS PCT: 24 % (ref 12.0–46.0)
Lymphs Abs: 1.1 10*3/uL (ref 0.7–4.0)
MCHC: 33.3 g/dL (ref 30.0–36.0)
MCV: 88.8 fl (ref 78.0–100.0)
Monocytes Absolute: 0.5 10*3/uL (ref 0.1–1.0)
Monocytes Relative: 9.9 % (ref 3.0–12.0)
NEUTROS ABS: 2.9 10*3/uL (ref 1.4–7.7)
Neutrophils Relative %: 61.7 % (ref 43.0–77.0)
PLATELETS: 320 10*3/uL (ref 150.0–400.0)
RBC: 3.83 Mil/uL — ABNORMAL LOW (ref 3.87–5.11)
RDW: 21.4 % — AB (ref 11.5–15.5)
WBC: 4.7 10*3/uL (ref 4.0–10.5)

## 2015-01-16 LAB — TSH: TSH: 9.95 u[IU]/mL — ABNORMAL HIGH (ref 0.35–4.50)

## 2015-01-23 ENCOUNTER — Ambulatory Visit (INDEPENDENT_AMBULATORY_CARE_PROVIDER_SITE_OTHER): Payer: Managed Care, Other (non HMO) | Admitting: Internal Medicine

## 2015-01-23 ENCOUNTER — Encounter: Payer: Self-pay | Admitting: Internal Medicine

## 2015-01-23 VITALS — BP 94/70 | Temp 98.8°F | Ht 65.5 in | Wt 198.0 lb

## 2015-01-23 DIAGNOSIS — E785 Hyperlipidemia, unspecified: Secondary | ICD-10-CM

## 2015-01-23 DIAGNOSIS — E039 Hypothyroidism, unspecified: Secondary | ICD-10-CM

## 2015-01-23 DIAGNOSIS — Z8249 Family history of ischemic heart disease and other diseases of the circulatory system: Secondary | ICD-10-CM

## 2015-01-23 DIAGNOSIS — Z0000 Encounter for general adult medical examination without abnormal findings: Secondary | ICD-10-CM

## 2015-01-23 DIAGNOSIS — C50412 Malignant neoplasm of upper-outer quadrant of left female breast: Secondary | ICD-10-CM

## 2015-01-23 DIAGNOSIS — H409 Unspecified glaucoma: Secondary | ICD-10-CM | POA: Insufficient documentation

## 2015-01-23 MED ORDER — LEVOTHYROXINE SODIUM 100 MCG PO TABS: 100.0000 ug | ORAL_TABLET | Freq: Every day | ORAL | Status: DC

## 2015-01-23 NOTE — Patient Instructions (Signed)
Begin 100 mcg of  Levothyroxine per day  Check TSH in 2-3 months  adjust  dose depending on  Results   Continue lifestyle intervention healthy eating and exercise .     Why follow it? Research shows. . Those who follow the Mediterranean diet have a reduced risk of heart disease  . The diet is associated with a reduced incidence of Parkinson's and Alzheimer's diseases . People following the diet may have longer life expectancies and lower rates of chronic diseases  . The Dietary Guidelines for Americans recommends the Mediterranean diet as an eating plan to promote health and prevent disease  What Is the Mediterranean Diet?  . Healthy eating plan based on typical foods and recipes of Mediterranean-style cooking . The diet is primarily a plant based diet; these foods should make up a majority of meals   Starches - Plant based foods should make up a majority of meals - They are an important sources of vitamins, minerals, energy, antioxidants, and fiber - Choose whole grains, foods high in fiber and minimally processed items  - Typical grain sources include wheat, oats, barley, corn, brown rice, bulgar, farro, millet, polenta, couscous  - Various types of beans include chickpeas, lentils, fava beans, black beans, white beans   Fruits  Veggies - Large quantities of antioxidant rich fruits & veggies; 6 or more servings  - Vegetables can be eaten raw or lightly drizzled with oil and cooked  - Vegetables common to the traditional Mediterranean Diet include: artichokes, arugula, beets, broccoli, brussel sprouts, cabbage, carrots, celery, collard greens, cucumbers, eggplant, kale, leeks, lemons, lettuce, mushrooms, okra, onions, peas, peppers, potatoes, pumpkin, radishes, rutabaga, shallots, spinach, sweet potatoes, turnips, zucchini - Fruits common to the Mediterranean Diet include: apples, apricots, avocados, cherries, clementines, dates, figs, grapefruits, grapes, melons, nectarines, oranges,  peaches, pears, pomegranates, strawberries, tangerines  Fats - Replace butter and margarine with healthy oils, such as olive oil, canola oil, and tahini  - Limit nuts to no more than a handful a day  - Nuts include walnuts, almonds, pecans, pistachios, pine nuts  - Limit or avoid candied, honey roasted or heavily salted nuts - Olives are central to the [employer] - can be eaten whole or used in a variety of dishes   Meats Protein - Limiting red meat: no more than a few times a month - When eating red meat: choose lean cuts and keep the portion to the size of deck of cards - Eggs: approx. 0 to 4 times a week  - Fish and lean poultry: at least 2 a week  - Healthy protein sources include, chicken, [place], lean beef, lamb - Increase intake of seafood such as tuna, salmon, trout, mackerel, shrimp, scallops - Avoid or limit high fat processed meats such as sausage and bacon  Dairy - Include moderate amounts of low fat dairy products  - Focus on healthy dairy such as fat free yogurt, skim milk, low or reduced fat cheese - Limit dairy products higher in fat such as whole or 2% milk, cheese, ice cream  Alcohol - Moderate amounts of red wine is ok  - No more than 5 oz daily for women (all ages) and men older than age 7  - No more than 10 oz of wine daily for men younger than 30  Other - Limit sweets and other desserts  - Use herbs and spices instead of salt to flavor foods  - Herbs and spices common to the traditional Mediterranean Diet include:  basil, bay leaves, chives, cloves, cumin, fennel, garlic, lavender, marjoram, mint, oregano, parsley, pepper, rosemary, sage, savory, sumac, tarragon, thyme   It's not just a diet, it's a lifestyle:  . The Mediterranean diet includes lifestyle factors typical of those in the region  . Foods, drinks and meals are best eaten with others and savored . Daily physical activity is important for overall good health . This could be strenuous exercise like  running and aerobics . This could also be more leisurely activities such as walking, housework, yard-work, or taking the stairs . Moderation is the key; a balanced and healthy diet accommodates most foods and drinks . Consider portion sizes and frequency of consumption of certain foods   Meal Ideas & Options:  . Breakfast:  o Whole wheat toast or whole wheat English muffins with peanut butter & hard boiled egg o Steel cut oats topped with apples & cinnamon and skim milk  o Fresh fruit: banana, strawberries, melon, berries, peaches  o Smoothies: strawberries, bananas, greek yogurt, peanut butter o Low fat greek yogurt with blueberries and granola  o Egg white omelet with spinach and mushrooms o Breakfast couscous: whole wheat couscous, apricots, skim milk, cranberries  . Sandwiches:  o Hummus and grilled vegetables (peppers, zucchini, squash) on whole wheat bread   o Grilled chicken on whole wheat pita with lettuce, tomatoes, cucumbers or tzatziki  o Tuna salad on whole wheat bread: tuna salad made with greek yogurt, olives, red peppers, capers, green onions o Garlic rosemary lamb pita: lamb sauted with garlic, rosemary, salt & pepper; add lettuce, cucumber, greek yogurt to pita - flavor with lemon juice and black pepper  . Seafood:  o Mediterranean grilled salmon, seasoned with garlic, basil, parsley, lemon juice and black pepper o Shrimp, lemon, and spinach whole-grain pasta salad made with low fat greek yogurt  o Seared scallops with lemon orzo  o Seared tuna steaks seasoned salt, pepper, coriander topped with tomato mixture of olives, tomatoes, olive oil, minced garlic, parsley, green onions and cappers  . Meats:  o Herbed greek chicken salad with kalamata olives, cucumber, feta  o Red bell peppers stuffed with spinach, bulgur, lean ground beef (or lentils) & topped with feta   o Kebabs: skewers of chicken, tomatoes, onions, zucchini, squash  o [place] burgers: made with red onions,  mint, dill, lemon juice, feta cheese topped with roasted red peppers . Vegetarian o Cucumber salad: cucumbers, artichoke hearts, celery, red onion, feta cheese, tossed in olive oil & lemon juice  o Hummus and whole grain pita points with a greek salad (lettuce, tomato, feta, olives, cucumbers, red onion) o Lentil soup with celery, carrots made with vegetable broth, garlic, salt and pepper  o Tabouli salad: parsley, bulgur, mint, scallions, cucumbers, tomato, radishes, lemon juice, olive oil, salt and pepper.

## 2015-01-23 NOTE — Progress Notes (Signed)
Pre visit review using our clinic review tool, if applicable. No additional management support is needed unless otherwise documented below in the visit note.    Chief Complaint  Patient presents with  . Annual Exam  . Hypothyroidism    HPI: Patient  Kimberly Reed  59 y.o. comes in today for [place] visit     Just finished rx for breast cancer  Left stage 4 ;one solitary lumbar met  Chemo and now to do surgery .  Dr Charlies Constable no longer in network and practice :out of armour thyroid for months or so  Too expensive  And needs different med.  Had seen psych and oncology for depression meds  Nothing seem to [facility] or had se so not on any meds for this.  On disability  For the breast cancer and rx . Works [employer] and plans  on return when can   Dr Babs Bertin eye for glaucoma stable.  Health Maintenance  Topic Date Due  . TETANUS/TDAP  05/20/1973  . ZOSTAVAX  05/20/2014  . HIV Screening  12/25/2015 (Originally 05/20/1971)  . INFLUENZA VACCINE  05/27/2015  . PAP SMEAR  03/27/2016  . MAMMOGRAM  05/18/2016  . COLONOSCOPY  06/01/2024   Health Maintenance Review LIFESTYLE:  Exercise:   no Tobacco/ETS: no Alcohol: no Sugar beverages: not much  Sleep:about 6  Drug use: no PAP: ok    ROS:  GEN/ HEENT: No fever, significant weight changes sweats headaches vision problems hearing changes, CV/ PULM; No chest pain shortness of breath cough, syncope,edema  change in exercise tolerance. GI /GU: No adominal pain, vomiting, change in bowel habits. No blood in the stool. No significant GU symptoms. SKIN/HEME: ,no acute skin rashes suspicious lesions or bleeding. No lymphadenopathy, nodules, masses.  NEURO/ PSYCH:  No neurologic signs such as weakness  Has some numbness in toes from chemo no weakness. IMM/ Allergy: No unusual infections.  Allergy .   REST of 12 system review negative except as per HPI   Past Medical History  Diagnosis Date  . Hypothyroid   . Glaucoma   .  Hyperlipemia   . Migraine without aura   . History of syncope 2008    loss of bladder control eval by Neuro  . Atypical chest pain   . Family history of heart disease   . Anxiety   . Depression   . Hot flashes   . Anemia   . Insomnia   . Cancer     left breast dx. 0'62- chemo planned to start 07-09-14- Dr. Lindi [facility]    Past Surgical History  Procedure Laterality Date  . Cataract extraction Left   . Glaucoma surgery Left     x1   . Examination under anesthesia  02/24/2013    Procedure: EXAM UNDER ANESTHESIA;  Surgeon: Azalia Bilis, MD;  Location: [facility];  Service: Gynecology;;  with removal of prolapsing cervical mass; pap smear and endometrial biopsy  . Uterine polyp removed      per hysteroscopy about 1 1/2 yrs ago.  . Esophagogastroduodenoscopy  06/01/14  . Tonsillectomy      child  . Portacath placement Right 07/06/2014    Procedure: PORT A CATH  PLACEMENT;  Surgeon: [name], MD;  Location: [facility];  Service: General;  Laterality: Right;    Family History  Problem Relation Age of Onset  . Aneurysm Mother   . Kidney disease Mother   . Hypertension Father   . Dementia Father   .  Kidney disease Brother   . Heart disease      "all of moms side"  . Heart failure Cousin   . Depression      History   Social History  . Marital Status: Single    Spouse Name: N/A  . Number of Children: N/A  . 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 Smoking status: Never Smoker   . Smokeless tobacco: Never Used  . Alcohol Use: No  . Drug Use: No     Comment: in 20's but not now  . Sexual Activity:    Partners: Female   Other Topics Concern  . 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     Hh of 1  2 cats      No tad    8 hours of sleep.       On  disabilirty for  Breast cancer .          Outpatient Encounter  Prescriptions as of 01/23/2015  Medication Sig  . acidophilus (RISAQUAD) CAPS capsule Take 1 capsule by mouth daily.  [name] ALPRAZolam (XANAX) 0.5 MG tablet Take 1 tablet (0.5 mg total) by mouth 3 (three) times daily as needed for anxiety.  . brimonidine-timolol (COMBIGAN) 0.2-0.5 % ophthalmic solution Place 1 drop into the right eye every 12 (twelve) hours.   . dorzolamide (TRUSOPT) 2 % ophthalmic solution Place 1 drop into the right eye 2 (two) times daily.   . Flaxseed, Linseed, (FLAX SEED OIL PO) Take 1 capsule by mouth daily.  . Omega-3 Fatty Acids (OMEGA 3 PO) Take 1 capsule by mouth daily.  [name] OVER THE COUNTER MEDICATION Take 1 capsule by mouth daily.  . Travoprost, BAK Free, (TRAVATAN) 0.004 % SOLN ophthalmic solution Place 1 drop into the right eye at bedtime.   . WHEY PROTEIN PO Take 1 scoop by mouth daily.  . [DISCONTINUED] prochlorperazine (COMPAZINE) 10 MG tablet Take 1 tablet (10 mg total) by mouth every 6 (six) hours as needed (Nausea or vomiting).  [name] levothyroxine (SYNTHROID) 100 MCG tablet Take 1 tablet (100 mcg total) by mouth daily before breakfast.  . [DISCONTINUED] ARIPiprazole (ABILIFY) 2 MG tablet   . [DISCONTINUED] dexamethasone (DECADRON) 4 MG tablet Take 2 tablets (8 mg total) by mouth 2 (two) times daily with a meal. Start the day after chemotherapy for 2 days. Take with food.  . [DISCONTINUED] HYDROcodone-acetaminophen (NORCO) 5-325 MG per tablet Take 1-2 tablets by mouth every 6 (six) hours as needed.  . [DISCONTINUED] lidocaine-prilocaine (EMLA) cream Apply 1 application topically as needed.  . [DISCONTINUED] LORazepam (ATIVAN) 1 MG tablet Take 1 tablet (1 mg total) by mouth 2 (two) times daily as needed for anxiety.  . [DISCONTINUED] ondansetron (ZOFRAN) 8 MG tablet Take 1 tablet (8 mg total) by mouth 2 (two) times daily. Start the day after chemo for 2 days. Then take as needed for nausea or vomiting.  . [DISCONTINUED] thyroid (ARMOUR THYROID) 180 MG tablet Take 1 tablet  (180 mg total) by mouth daily before breakfast.  . [DISCONTINUED] venlafaxine XR (EFFEXOR-XR) 37.5 MG 24 hr capsule Take 1 capsule (37.5 mg total) by mouth daily with breakfast.    EXAM:  BP 94/70 mmHg  Temp(Src) 98.8 F (37.1 C) (Oral)  Ht 5' 5.5" (1.664 m)  Wt 198 lb (  89.812 kg)  BMI 32.44 kg/m2  LMP 02/15/2013  Body mass index is 32.44 kg/(m^2).  Physical Exam: Vital signs reviewed HBZ:JIRC is a well-developed well-nourished alert cooperative    who appearsr stated age in no acute distress.  Appears subdued nl cognition and conversation HEENT: normocephalic atraumatic , Eyes: PERRL EOM's full, conjunctiva clear glasses , Nares: paten,t no deformity discharge or tenderness., Ears: no deformity EAC's clear TMs with normal landmarks. Mouth: clear OP, no lesions, edema.  Moist mucous membranes. Dentition in adequate repair. NECK: supple without masses, thyromegaly or bruits. CHEST/PULM:  Clear to auscultation and percussion breath sounds equal no wheeze , rales or rhonchi. No chest wall deformities or tenderness. CV: PMI is nondisplaced, S1 S2 no gallops, murmurs, rubs. Peripheral pulses are full without delay.No JVD .  Porta cath in place  ABDOMEN: Bowel sounds normal nontender  No guard or rebound, no hepato splenomegal no CVA tenderness.  No hernia. Extremtities:  No clubbing cyanosis or edema, no acute joint swelling or redness no focal atrophy some vv no acute findings no ulcers  NEURO:  Oriented x3, cranial nerves 3-12 appear to be intact, no obvious focal weakness,gait within normal limits no abnormal reflexes or asymmetrical SKIN: No acute rashes normal turgor, color, no bruising or petechiae. PSYCH: Oriented, good eye contact,  Subdued cognition intact . LN: no cervical axillary inguinal adenopathy  Lab Results  Component Value Date   WBC 4.7 01/16/2015   HGB 11.3* 01/16/2015   HCT 34.0* 01/16/2015   PLT 320.0 01/16/2015   GLUCOSE 100* 01/16/2015   CHOL 248* 01/16/2015    TRIG 96.0 01/16/2015   HDL 53.10 01/16/2015   LDLDIRECT 167.3 03/16/2011   LDLCALC 176* 01/16/2015   ALT 12 01/16/2015   AST 15 01/16/2015   NA 140 01/16/2015   K 4.9 01/16/2015   CL 104 01/16/2015   CREATININE 0.71 01/16/2015   BUN 19 01/16/2015   CO2 30 01/16/2015   TSH 9.95* 01/16/2015   INR 1.02 07/19/2014   Wt Readings from Last 3 Encounters:  01/23/15 198 lb (89.812 kg)  12/26/14 196 lb 4.8 oz (89.041 kg)  12/18/14 196 lb 3.2 oz (88.996 kg)    ASSESSMENT AND PLAN:  Discussed the following assessment and plan:  Visit for preventive health examination - declined td and zostavax today otherwise .   Hypothyroidism, unspecified hypothyroidism type - off med per gyne armour cost change levothyr 100 and adjust accordingly   Hyperlipidemia - lsi  get tsh in range  Breast cancer of upper-outer quadrant of left female breast - to go for surgery soon   Family history of heart disease  Glaucoma - dr Babs Bertin eye. on meds glasses no limitation Anemia better than last heme check  Patient Care Team: Burnis Medin, MD as PCP - General (Internal Medicine) Nicholas Lose, MD as Consulting Physician (Hematology and Oncology) [name], MD as Consulting Physician (General Surgery) Eppie Gibson, MD as Attending Physician (Radiation Oncology) Patient Instructions   Begin 100 mcg of  Levothyroxine per day  Check TSH in 2-3 months  adjust  dose depending on  Results   Continue lifestyle intervention healthy eating and exercise .     Why follow it? Research shows. . Those who follow the Mediterranean diet have a reduced risk of heart disease  . The diet is associated with a reduced incidence of Parkinson's and Alzheimer's diseases . People following the diet may have longer life expectancies and lower rates of chronic diseases  .  The Dietary Guidelines for Americans recommends the Mediterranean diet as an eating plan to promote health and prevent disease  What Is  the Mediterranean Diet?  . Healthy eating plan based on typical foods and recipes of Mediterranean-style cooking . The diet is primarily a plant based diet; these foods should make up a majority of meals   Starches - Plant based foods should make up a majority of meals - They are an important sources of vitamins, minerals, energy, antioxidants, and fiber - Choose whole grains, foods high in fiber and minimally processed items  - Typical grain sources include wheat, oats, barley, corn, brown rice, bulgar, farro, millet, polenta, couscous  - Various types of beans include chickpeas, lentils, fava beans, black beans, white beans   Fruits  Veggies - Large quantities of antioxidant rich fruits & veggies; 6 or more servings  - Vegetables can be eaten raw or lightly drizzled with oil and cooked  - Vegetables common to the traditional Mediterranean Diet include: artichokes, arugula, beets, broccoli, brussel sprouts, cabbage, carrots, celery, collard greens, cucumbers, eggplant, kale, leeks, lemons, lettuce, mushrooms, okra, onions, peas, peppers, potatoes, pumpkin, radishes, rutabaga, shallots, spinach, sweet potatoes, turnips, zucchini - Fruits common to the Mediterranean Diet include: apples, apricots, avocados, cherries, clementines, dates, figs, grapefruits, grapes, melons, nectarines, oranges, peaches, pears, pomegranates, strawberries, tangerines  Fats - Replace butter and margarine with healthy oils, such as olive oil, canola oil, and tahini  - Limit nuts to no more than a handful a day  - Nuts include walnuts, almonds, pecans, pistachios, pine nuts  - Limit or avoid candied, honey roasted or heavily salted nuts - Olives are central to the [employer] - can be eaten whole or used in a variety of dishes   Meats Protein - Limiting red meat: no more than a few times a month - When eating red meat: choose lean cuts and keep the portion to the size of deck of cards - Eggs: approx. 0 to 4 times a  week  - Fish and lean poultry: at least 2 a week  - Healthy protein sources include, chicken, [place], lean beef, lamb - Increase intake of seafood such as tuna, salmon, trout, mackerel, shrimp, scallops - Avoid or limit high fat processed meats such as sausage and bacon  Dairy - Include moderate amounts of low fat dairy products  - Focus on healthy dairy such as fat free yogurt, skim milk, low or reduced fat cheese - Limit dairy products higher in fat such as whole or 2% milk, cheese, ice cream  Alcohol - Moderate amounts of red wine is ok  - No more than 5 oz daily for women (all ages) and men older than age 55  - No more than 10 oz of wine daily for men younger than 4  Other - Limit sweets and other desserts  - Use herbs and spices instead of salt to flavor foods  - Herbs and spices common to the traditional Mediterranean Diet include: basil, bay leaves, chives, cloves, cumin, fennel, garlic, lavender, marjoram, mint, oregano, parsley, pepper, rosemary, sage, savory, sumac, tarragon, thyme   It's not just a diet, it's a lifestyle:  . The Mediterranean diet includes lifestyle factors typical of those in the region  . Foods, drinks and meals are best eaten with others and savored . Daily physical activity is important for overall good health . This could be strenuous exercise like running and aerobics . This could also be more leisurely activities such as  walking, housework, yard-work, or taking the stairs . Moderation is the key; a balanced and healthy diet accommodates most foods and drinks . Consider portion sizes and frequency of consumption of certain foods   Meal Ideas & Options:  . Breakfast:  o Whole wheat toast or whole wheat English muffins with peanut butter & hard boiled egg o Steel cut oats topped with apples & cinnamon and skim milk  o Fresh fruit: banana, strawberries, melon, berries, peaches  o Smoothies: strawberries, bananas, greek yogurt, peanut butter o Low fat  greek yogurt with blueberries and granola  o Egg white omelet with spinach and mushrooms o Breakfast couscous: whole wheat couscous, apricots, skim milk, cranberries  . Sandwiches:  o Hummus and grilled vegetables (peppers, zucchini, squash) on whole wheat bread   o Grilled chicken on whole wheat pita with lettuce, tomatoes, cucumbers or tzatziki  o Tuna salad on whole wheat bread: tuna salad made with greek yogurt, olives, red peppers, capers, green onions o Garlic rosemary lamb pita: lamb sauted with garlic, rosemary, salt & pepper; add lettuce, cucumber, greek yogurt to pita - flavor with lemon juice and black pepper  . Seafood:  o Mediterranean grilled salmon, seasoned with garlic, basil, parsley, lemon juice and black pepper o Shrimp, lemon, and spinach whole-grain pasta salad made with low fat greek yogurt  o Seared scallops with lemon orzo  o Seared tuna steaks seasoned salt, pepper, coriander topped with tomato mixture of olives, tomatoes, olive oil, minced garlic, parsley, green onions and cappers  . Meats:  o Herbed greek chicken salad with kalamata olives, cucumber, feta  o Red bell peppers stuffed with spinach, bulgur, lean ground beef (or lentils) & topped with feta   o Kebabs: skewers of chicken, tomatoes, onions, zucchini, squash  o [place] burgers: made with red onions, mint, dill, lemon juice, feta cheese topped with roasted red peppers . Vegetarian o Cucumber salad: cucumbers, artichoke hearts, celery, red onion, feta cheese, tossed in olive oil & lemon juice  o Hummus and whole grain pita points with a greek salad (lettuce, tomato, feta, olives, cucumbers, red onion) o Lentil soup with celery, carrots made with vegetable broth, garlic, salt and pepper  o Tabouli salad: parsley, bulgur, mint, scallions, cucumbers, tomato, radishes, lemon juice, olive oil, salt and pepper.         Standley Brooking. Panosh M.D.

## 2015-01-24 ENCOUNTER — Encounter (HOSPITAL_BASED_OUTPATIENT_CLINIC_OR_DEPARTMENT_OTHER): Payer: Self-pay | Admitting: *Deleted

## 2015-01-24 NOTE — Progress Notes (Signed)
To bring overnight bag, meds, wanted to know if PAC is coming out-not in words-but ask dr [name] before surgery

## 2015-01-28 ENCOUNTER — Other Ambulatory Visit: Payer: Self-pay | Admitting: General Surgery

## 2015-01-28 ENCOUNTER — Ambulatory Visit
Admission: RE | Admit: 2015-01-28 | Discharge: 2015-01-28 | Disposition: A | Discharge status: Home / Self Care | Payer: Managed Care, Other (non HMO) | Source: Ambulatory Visit | Attending: General Surgery | Admitting: General Surgery

## 2015-01-28 DIAGNOSIS — C50912 Malignant neoplasm of unspecified site of left female breast: Secondary | ICD-10-CM

## 2015-01-30 ENCOUNTER — Ambulatory Visit
Admission: RE | Admit: 2015-01-30 | Discharge: 2015-01-30 | Disposition: A | Discharge status: Home / Self Care | Payer: Managed Care, Other (non HMO) | Source: Ambulatory Visit | Attending: General Surgery | Admitting: General Surgery

## 2015-01-30 ENCOUNTER — Ambulatory Visit (HOSPITAL_BASED_OUTPATIENT_CLINIC_OR_DEPARTMENT_OTHER): Payer: Managed Care, Other (non HMO) | Admitting: Anesthesiology

## 2015-01-30 ENCOUNTER — Encounter (HOSPITAL_BASED_OUTPATIENT_CLINIC_OR_DEPARTMENT_OTHER): Payer: Self-pay | Admitting: *Deleted

## 2015-01-30 ENCOUNTER — Encounter (HOSPITAL_BASED_OUTPATIENT_CLINIC_OR_DEPARTMENT_OTHER)
Admission: RE | Disposition: A | Discharge status: Home / Self Care | Payer: Self-pay | Source: Ambulatory Visit | Attending: General Surgery

## 2015-01-30 ENCOUNTER — Ambulatory Visit (HOSPITAL_BASED_OUTPATIENT_CLINIC_OR_DEPARTMENT_OTHER)
Admission: RE | Admit: 2015-01-30 | Discharge: 2015-01-31 | Disposition: A | Discharge status: Home / Self Care | Payer: Managed Care, Other (non HMO) | Source: Ambulatory Visit | Attending: General Surgery | Admitting: General Surgery

## 2015-01-30 DIAGNOSIS — C773 Secondary and unspecified malignant neoplasm of axilla and upper limb lymph nodes: Secondary | ICD-10-CM | POA: Insufficient documentation

## 2015-01-30 DIAGNOSIS — E785 Hyperlipidemia, unspecified: Secondary | ICD-10-CM | POA: Insufficient documentation

## 2015-01-30 DIAGNOSIS — E039 Hypothyroidism, unspecified: Secondary | ICD-10-CM | POA: Insufficient documentation

## 2015-01-30 DIAGNOSIS — E669 Obesity, unspecified: Secondary | ICD-10-CM | POA: Diagnosis not present

## 2015-01-30 DIAGNOSIS — Z6833 Body mass index (BMI) 33.0-33.9, adult: Secondary | ICD-10-CM | POA: Diagnosis not present

## 2015-01-30 DIAGNOSIS — C50412 Malignant neoplasm of upper-outer quadrant of left female breast: Secondary | ICD-10-CM | POA: Insufficient documentation

## 2015-01-30 DIAGNOSIS — C7951 Secondary malignant neoplasm of bone: Secondary | ICD-10-CM | POA: Diagnosis not present

## 2015-01-30 DIAGNOSIS — F4323 Adjustment disorder with mixed anxiety and depressed mood: Secondary | ICD-10-CM | POA: Diagnosis not present

## 2015-01-30 DIAGNOSIS — G4700 Insomnia, unspecified: Secondary | ICD-10-CM | POA: Insufficient documentation

## 2015-01-30 DIAGNOSIS — C50912 Malignant neoplasm of unspecified site of left female breast: Secondary | ICD-10-CM | POA: Diagnosis present

## 2015-01-30 DIAGNOSIS — G43909 Migraine, unspecified, not intractable, without status migrainosus: Secondary | ICD-10-CM | POA: Diagnosis not present

## 2015-01-30 DIAGNOSIS — D6481 Anemia due to antineoplastic chemotherapy: Secondary | ICD-10-CM | POA: Insufficient documentation

## 2015-01-30 HISTORY — DX: Presence of spectacles and contact lenses: Z97.3

## 2015-01-30 HISTORY — PX: BREAST LUMPECTOMY: SHX2

## 2015-01-30 HISTORY — PX: BREAST LUMPECTOMY WITH NEEDLE LOCALIZATION AND AXILLARY LYMPH NODE DISSECTION: SHX5758

## 2015-01-30 LAB — POCT HEMOGLOBIN-HEMACUE: Hemoglobin: 11.8 g/dL — ABNORMAL LOW (ref 12.0–15.0)

## 2015-01-30 SURGERY — BREAST LUMPECTOMY WITH NEEDLE LOCALIZATION AND AXILLARY LYMPH NODE DISSECTION
Anesthesia: General | Site: Breast | Laterality: Left

## 2015-01-30 MED ORDER — HEPARIN SODIUM (PORCINE) 5000 UNIT/ML IJ SOLN
5000.0000 [IU] | Freq: Three times a day (TID) | INTRAMUSCULAR | Status: DC
  Administered 2015-01-30 – 2015-01-31 (×2): 5000 [IU] via SUBCUTANEOUS

## 2015-01-30 MED ORDER — PROPOFOL 10 MG/ML IV BOLUS
INTRAVENOUS | Status: DC | PRN
  Administered 2015-01-30: 150 mg via INTRAVENOUS

## 2015-01-30 MED ORDER — OXYCODONE HCL 5 MG/5ML PO SOLN: 5.0000 mg | Freq: Once | ORAL | Status: DC | PRN

## 2015-01-30 MED ORDER — HYDROMORPHONE HCL 1 MG/ML IJ SOLN
INTRAMUSCULAR | Status: AC
  Filled 2015-01-30: qty 1

## 2015-01-30 MED ORDER — PROPOFOL 10 MG/ML IV BOLUS
INTRAVENOUS | Status: AC
Start: 2015-01-30 — End: 2015-01-30
  Filled 2015-01-30: qty 20

## 2015-01-30 MED ORDER — HYDROMORPHONE HCL 1 MG/ML IJ SOLN
INTRAMUSCULAR | Status: AC
Start: 2015-01-30 — End: 2015-01-30
  Filled 2015-01-30: qty 1

## 2015-01-30 MED ORDER — CHLORHEXIDINE GLUCONATE 4 % EX LIQD: 1.0000 "application " | Freq: Once | CUTANEOUS | Status: DC

## 2015-01-30 MED ORDER — DORZOLAMIDE HCL 2 % OP SOLN
1.0000 [drp] | Freq: Two times a day (BID) | OPHTHALMIC | Status: DC
  Administered 2015-01-30: 1 [drp] via OPHTHALMIC

## 2015-01-30 MED ORDER — HEPARIN SODIUM (PORCINE) 5000 UNIT/ML IJ SOLN
INTRAMUSCULAR | Status: AC
  Filled 2015-01-30: qty 1

## 2015-01-30 MED ORDER — DEXAMETHASONE SODIUM PHOSPHATE 4 MG/ML IJ SOLN
INTRAMUSCULAR | Status: DC | PRN
  Administered 2015-01-30: 10 mg via INTRAVENOUS

## 2015-01-30 MED ORDER — LATANOPROST 0.005 % OP SOLN
1.0000 [drp] | Freq: Every day | OPHTHALMIC | Status: DC
  Administered 2015-01-30: 1 [drp] via OPHTHALMIC

## 2015-01-30 MED ORDER — MIDAZOLAM HCL 2 MG/2ML IJ SOLN
INTRAMUSCULAR | Status: AC
  Filled 2015-01-30: qty 2

## 2015-01-30 MED ORDER — EPHEDRINE SULFATE 50 MG/ML IJ SOLN
INTRAMUSCULAR | Status: DC | PRN
  Administered 2015-01-30 (×3): 10 mg via INTRAVENOUS

## 2015-01-30 MED ORDER — FENTANYL CITRATE 0.05 MG/ML IJ SOLN
50.0000 ug | INTRAMUSCULAR | Status: DC | PRN
  Administered 2015-01-30: 100 ug via INTRAVENOUS

## 2015-01-30 MED ORDER — LIDOCAINE HCL (CARDIAC) 20 MG/ML IV SOLN
INTRAVENOUS | Status: DC | PRN
  Administered 2015-01-30: 80 mg via INTRAVENOUS

## 2015-01-30 MED ORDER — BUPIVACAINE-EPINEPHRINE (PF) 0.25% -1:200000 IJ SOLN
INTRAMUSCULAR | Status: AC
  Filled 2015-01-30: qty 30

## 2015-01-30 MED ORDER — LACTATED RINGERS IV SOLN
INTRAVENOUS | Status: DC
  Administered 2015-01-30 (×3): via INTRAVENOUS

## 2015-01-30 MED ORDER — OXYCODONE HCL 5 MG PO TABS: 5.0000 mg | ORAL_TABLET | Freq: Once | ORAL | Status: DC | PRN

## 2015-01-30 MED ORDER — LEVOTHYROXINE SODIUM 100 MCG PO TABS
100.0000 ug | ORAL_TABLET | Freq: Every day | ORAL | Status: DC
  Administered 2015-01-31: 100 ug via ORAL

## 2015-01-30 MED ORDER — OXYCODONE HCL 5 MG PO TABS
5.0000 mg | ORAL_TABLET | ORAL | Status: DC | PRN
  Administered 2015-01-30 – 2015-01-31 (×4): 5 mg via ORAL
  Filled 2015-01-30 (×4): qty 1

## 2015-01-30 MED ORDER — PROMETHAZINE HCL 25 MG/ML IJ SOLN
INTRAMUSCULAR | Status: AC
  Filled 2015-01-30: qty 1

## 2015-01-30 MED ORDER — FENTANYL CITRATE 0.05 MG/ML IJ SOLN
INTRAMUSCULAR | Status: AC
  Filled 2015-01-30: qty 4

## 2015-01-30 MED ORDER — BRIMONIDINE TARTRATE-TIMOLOL 0.2-0.5 % OP SOLN
1.0000 [drp] | Freq: Two times a day (BID) | OPHTHALMIC | Status: DC
  Administered 2015-01-30 – 2015-01-31 (×2): 1 [drp] via OPHTHALMIC

## 2015-01-30 MED ORDER — ONDANSETRON HCL 4 MG/2ML IJ SOLN
INTRAMUSCULAR | Status: DC | PRN
  Administered 2015-01-30: 4 mg via INTRAVENOUS

## 2015-01-30 MED ORDER — MIDAZOLAM HCL 2 MG/2ML IJ SOLN
1.0000 mg | INTRAMUSCULAR | Status: DC | PRN
  Administered 2015-01-30: 2 mg via INTRAVENOUS

## 2015-01-30 MED ORDER — OXYCODONE-ACETAMINOPHEN 5-325 MG PO TABS: 1.0000 | ORAL_TABLET | ORAL | Status: DC | PRN

## 2015-01-30 MED ORDER — CEFAZOLIN SODIUM-DEXTROSE 2-3 GM-% IV SOLR
INTRAVENOUS | Status: AC
  Filled 2015-01-30: qty 50

## 2015-01-30 MED ORDER — BUPIVACAINE HCL (PF) 0.5 % IJ SOLN
INTRAMUSCULAR | Status: DC | PRN
  Administered 2015-01-30: 30 mL

## 2015-01-30 MED ORDER — MORPHINE SULFATE 2 MG/ML IJ SOLN: 2.0000 mg | INTRAMUSCULAR | Status: DC | PRN

## 2015-01-30 MED ORDER — ONDANSETRON HCL 4 MG/2ML IJ SOLN: 4.0000 mg | Freq: Four times a day (QID) | INTRAMUSCULAR | Status: DC | PRN

## 2015-01-30 MED ORDER — HYDROMORPHONE HCL 1 MG/ML IJ SOLN
0.2500 mg | INTRAMUSCULAR | Status: DC | PRN
  Administered 2015-01-30 (×3): 0.5 mg via INTRAVENOUS

## 2015-01-30 MED ORDER — CEFAZOLIN SODIUM-DEXTROSE 2-3 GM-% IV SOLR
2.0000 g | INTRAVENOUS | Status: AC
  Administered 2015-01-30: 2 g via INTRAVENOUS

## 2015-01-30 MED ORDER — PROMETHAZINE HCL 25 MG/ML IJ SOLN
6.2500 mg | INTRAMUSCULAR | Status: DC | PRN
  Administered 2015-01-30: 6.25 mg via INTRAVENOUS

## 2015-01-30 MED ORDER — FENTANYL CITRATE 0.05 MG/ML IJ SOLN
INTRAMUSCULAR | Status: AC
  Filled 2015-01-30: qty 2

## 2015-01-30 MED ORDER — FENTANYL CITRATE 0.05 MG/ML IJ SOLN
INTRAMUSCULAR | Status: DC | PRN
  Administered 2015-01-30 (×2): 25 ug via INTRAVENOUS

## 2015-01-30 MED ORDER — LACTATED RINGERS IV SOLN
INTRAVENOUS | Status: DC
  Administered 2015-01-30: 17:00:00 via INTRAVENOUS

## 2015-01-30 MED ORDER — ALPRAZOLAM 0.5 MG PO TABS: 0.5000 mg | ORAL_TABLET | Freq: Three times a day (TID) | ORAL | Status: DC | PRN

## 2015-01-30 MED ORDER — BUPIVACAINE-EPINEPHRINE 0.25% -1:200000 IJ SOLN
INTRAMUSCULAR | Status: DC | PRN
  Administered 2015-01-30: 3 mL

## 2015-01-30 SURGICAL SUPPLY — 49 items
APPLIER CLIP 9.375 MED OPEN (MISCELLANEOUS)
APR CLP MED 9.3 20 MLT OPN (MISCELLANEOUS)
BLADE SURG 15 STRL LF DISP TIS (BLADE) ×2 IMPLANT
BLADE SURG 15 STRL SS (BLADE) ×3
CANISTER SUCT 1200ML W/VALVE (MISCELLANEOUS) ×3 IMPLANT
CHLORAPREP W/TINT 26ML (MISCELLANEOUS) ×3 IMPLANT
CLIP APPLIE 9.375 MED OPEN (MISCELLANEOUS) ×1 IMPLANT
COVER BACK TABLE 60X90IN (DRAPES) ×3 IMPLANT
COVER MAYO STAND STRL (DRAPES) ×3 IMPLANT
DECANTER SPIKE VIAL GLASS SM (MISCELLANEOUS) ×3 IMPLANT
DEVICE DUBIN W/COMP PLATE 8390 (MISCELLANEOUS) ×3 IMPLANT
DRAIN CHANNEL 19F RND (DRAIN) ×1 IMPLANT
DRAIN HEMOVAC 1/8 X 5 (WOUND CARE) IMPLANT
DRAPE LAPAROSCOPIC ABDOMINAL (DRAPES) ×3 IMPLANT
DRAPE UTILITY XL STRL (DRAPES) ×3 IMPLANT
ELECT COATED BLADE 2.86 ST (ELECTRODE) ×3 IMPLANT
ELECT REM PT RETURN 9FT ADLT (ELECTROSURGICAL) ×3
ELECTRODE REM PT RTRN 9FT ADLT (ELECTROSURGICAL) ×2 IMPLANT
EVACUATOR SILICONE 100CC (DRAIN) ×1 IMPLANT
GLOVE BIOGEL PI IND STRL 7.0 (GLOVE) ×1 IMPLANT
GLOVE BIOGEL PI IND STRL 8 (GLOVE) ×3 IMPLANT
GLOVE BIOGEL PI INDICATOR 7.0 (GLOVE) ×1
GLOVE BIOGEL PI INDICATOR 8 (GLOVE) ×2
GLOVE ECLIPSE 7.5 STRL STRAW (GLOVE) ×5 IMPLANT
GLOVE SURG SS PI 8.0 STRL IVOR (GLOVE) ×2 IMPLANT
GOWN STRL REUS W/ TWL LRG LVL3 (GOWN DISPOSABLE) ×2 IMPLANT
GOWN STRL REUS W/ TWL XL LVL3 (GOWN DISPOSABLE) ×3 IMPLANT
GOWN STRL REUS W/TWL LRG LVL3 (GOWN DISPOSABLE) ×3
GOWN STRL REUS W/TWL XL LVL3 (GOWN DISPOSABLE) ×6
KIT MARKER MARGIN INK (KITS) ×3 IMPLANT
LIQUID BAND (GAUZE/BANDAGES/DRESSINGS) ×3 IMPLANT
NDL HYPO 25X1 1.5 SAFETY (NEEDLE) IMPLANT
NEEDLE HYPO 25X1 1.5 SAFETY (NEEDLE) ×3 IMPLANT
PACK BASIN DAY SURGERY FS (CUSTOM PROCEDURE TRAY) ×3 IMPLANT
PENCIL BUTTON HOLSTER BLD 10FT (ELECTRODE) ×3 IMPLANT
SLEEVE SCD COMPRESS KNEE MED (MISCELLANEOUS) ×3 IMPLANT
SPONGE LAP 4X18 X RAY DECT (DISPOSABLE) ×3 IMPLANT
STAPLER VISISTAT 35W (STAPLE) IMPLANT
SUT ETHILON 3 0 PS 1 (SUTURE) ×3 IMPLANT
SUT MNCRL AB 4-0 PS2 18 (SUTURE) ×3 IMPLANT
SUT VIC AB 3-0 54X BRD REEL (SUTURE) IMPLANT
SUT VIC AB 3-0 BRD 54 (SUTURE)
SUT VICRYL 3-0 CR8 SH (SUTURE) ×3 IMPLANT
SYR BULB 3OZ (MISCELLANEOUS) ×2 IMPLANT
SYR CONTROL 10ML LL (SYRINGE) ×2 IMPLANT
TOWEL OR 17X24 6PK STRL BLUE (TOWEL DISPOSABLE) ×3 IMPLANT
TOWEL OR NON WOVEN STRL DISP B (DISPOSABLE) ×3 IMPLANT
TUBE CONNECTING 20X1/4 (TUBING) ×3 IMPLANT
YANKAUER SUCT BULB TIP NO VENT (SUCTIONS) ×3 IMPLANT

## 2015-01-30 NOTE — Anesthesia Postprocedure Evaluation (Signed)
  Anesthesia Post-op Note  Patient: Kimberly Reed  Procedure(s) Performed: Procedure(s): BREAST LUMPECTOMY WITH NEEDLE LOCALIZATION LEFT AXILLARY DISSECTION REMOVAL OF PORT (Left)  Patient Location: PACU  Anesthesia Type:GA combined with regional for post-op pain  Level of Consciousness: awake and alert   Airway and Oxygen Therapy: Patient Spontanous Breathing  Post-op Pain: mild  Post-op Assessment: Post-op Vital signs reviewed  Post-op Vital Signs: stable  Last Vitals:  Filed Vitals:   01/30/15 1630  BP: 162/95  Pulse: 70  Temp: 36.2 C  Resp: 16    Complications: No apparent anesthesia complications

## 2015-01-30 NOTE — Anesthesia Preprocedure Evaluation (Signed)
Anesthesia Evaluation  Patient identified by MRN, date of birth, ID band Patient awake    Reviewed: Allergy & Precautions, NPO status , Patient's Chart, lab work & pertinent test results  History of Anesthesia Complications Negative for: history of anesthetic complications  Airway Mallampati: II     Mouth opening: Limited Mouth Opening  Dental  (+) Teeth Intact   Pulmonary neg pulmonary ROS,  breath sounds clear to auscultation        Cardiovascular negative cardio ROS  Rhythm:Regular Rate:Normal     Neuro/Psych  Headaches,    GI/Hepatic negative GI ROS, Neg liver ROS,   Endo/Other  Hypothyroidism Morbid obesity  Renal/GU negative Renal ROS     Musculoskeletal   Abdominal (+) + obese,   Peds  Hematology   Anesthesia Other Findings   Reproductive/Obstetrics                             Anesthesia Physical Anesthesia Plan  ASA: II  Anesthesia Plan: General   Post-op Pain Management:    Induction: Intravenous  Airway Management Planned: LMA  Additional Equipment:   Intra-op Plan:   Post-operative Plan: Extubation in OR  Informed Consent: I have reviewed the patients History and Physical, chart, labs and discussed the procedure including the risks, benefits and alternatives for the proposed anesthesia with the patient or authorized representative who has indicated his/her understanding and acceptance.   Dental advisory given  Plan Discussed with: CRNA and Surgeon  Anesthesia Plan Comments:         Anesthesia Quick Evaluation

## 2015-01-30 NOTE — H&P (Signed)
History of Present Illness [name] T. Desiree Fleming MD; 01/03/2015 12:44 PM) The patient is a 59 year old female who presents with breast cancer. She returns for surgical evaluation after completion of chemotherapy. Patient initially presented with a new diagnosis of cancer of the left breast in August 2015. She had an area of calcifications of the left breast measuring 30 mm with MRI showing a similar area of enhancement following large core needle biopsy showing in situ and invasive ductal carcinoma. She had numerous enlarged level I and level II left axillary lymph nodes on MRI biopsy positive for metastatic cancer. She also had an area in her spine at L1 that lit up on PET scan and subsequent biopsy showed metastatic cancer. She therefore had stage IV disease, ER 100 percent, PR 4%, HER-2 negative ratio 1.3 Ki-67 15% Chemotherapy summary: Completed dose dense Adriamycin Cytoxan 4 started 07/26/2014. Completed 12 weeks of Taxol by 12/18/2014.  Radiology review: PET CT scan and a breast MRI were performed 12/25/2014 that showed remarkable response to chemotherapy in the breast as well as the L1 vertebral body. Patient continues to have some lymphadenopathy in the axilla. She overall feels well and is here to discuss surgical planning.   Other Problems Elbert Ewings, CMA; 01/03/2015 12:05 PM) Anxiety Disorder Breast Cancer Depression Thyroid Disease  Past Surgical History Elbert Ewings, CMA; 01/03/2015 12:05 PM) No pertinent past surgical history  Diagnostic Studies History Elbert Ewings, CMA; 01/03/2015 12:05 PM) Colonoscopy within last year Mammogram within last year Pap Smear 1-5 years ago  Allergies Elbert Ewings, CMA; 01/03/2015 12:06 PM) Effexor XR *ANTIDEPRESSANTS*  Medication History Elbert Ewings, CMA; 01/03/2015 12:10 PM) Combigan (0.2-0.5% Solution, Ophthalmic) Active. Dorzolamide HCl (2% Solution, Ophthalmic) Active. Travatan Z (0.004% Solution, Ophthalmic)  Active. RisaQuad-2 (Oral) Active. Xanax (0.5MG Tablet, Oral) Active. Abilify (2MG Tablet, Oral) Active. Decadron (4MG Tablet, Oral) Active. Trusopt (2% Solution, Ophthalmic) Active. Flaxseed (Linseed) (Oral) Active. Hydrocodone-Acetaminophen (10-325MG/15ML Solution, Oral) Active. EMLA (2.5-2.5% Cream, External) Active. Ativan (1MG Tablet, Oral) Active. Omega 3 (1000MG Capsule, Oral) Active. Compazine (10MG Tablet, Oral) Active. Thyroid (180MG Tablet, Oral) Active. Effexor XR (37.5MG Capsule ER 24HR, Oral) Active. Venlafaxine HCl (37.5MG Tablet, Oral) Active. Whey Protein Isolate Active. Medications Reconciled  Social History Elbert Ewings, [place]; 01/03/2015 12:05 PM) Caffeine use Coffee. No alcohol use No drug use Tobacco use Never smoker.  Family History Elbert Ewings, [place]; 01/03/2015 12:05 PM) Depression Brother, Mother. Heart Disease Brother, Mother. Heart disease in female family member before age 52 Hypertension Father, Mother. Thyroid problems Mother, Sister.  Pregnancy / Birth History Elbert Ewings, [place]; 01/03/2015 12:05 PM) Age at menarche 12 years. Gravida 0 Para 0  Review of Systems Elbert Ewings CMA; 01/03/2015 12:05 PM) General Not Present- Appetite Loss, Chills, Fatigue, Fever, Night Sweats, Weight Gain and Weight Loss. HEENT Not Present- Earache, Hearing Loss, Hoarseness, Nose Bleed, Oral Ulcers, Ringing in the Ears, Seasonal Allergies, Sinus Pain, Sore Throat, Visual Disturbances, Wears glasses/contact lenses and Yellow Eyes. Respiratory Not Present- Bloody sputum, Chronic Cough, Difficulty Breathing, Snoring and Wheezing. Breast Not Present- Breast Mass, Breast Pain, Nipple Discharge and Skin Changes. Cardiovascular Not Present- Chest Pain, Difficulty Breathing Lying Down, Leg Cramps, Palpitations, Rapid Heart Rate, Shortness of Breath and Swelling of Extremities. Gastrointestinal Not Present- Abdominal Pain, Bloating, Bloody Stool, Change  in Bowel Habits, Chronic diarrhea, Constipation, Difficulty Swallowing, Excessive gas, Gets full quickly at meals, Hemorrhoids, Indigestion, Nausea, Rectal Pain and Vomiting. Female Genitourinary Not Present- Frequency, Nocturia, Painful Urination, Pelvic Pain and Urgency. Musculoskeletal Not Present- Back  Pain, Joint Pain, Joint Stiffness, Muscle Pain, Muscle Weakness and Swelling of Extremities. Neurological Present- Decreased Memory, Headaches and Weakness. Not Present- Fainting, Numbness, Seizures, Tingling, Tremor and Trouble walking. Psychiatric Present- Anxiety and Fearful. Not Present- Bipolar, Change in Sleep Pattern, Depression and Frequent crying. Endocrine Present- Cold Intolerance and Hair Changes. Not Present- Excessive Hunger, Heat Intolerance, Hot flashes and New Diabetes.   Vitals Elbert Ewings CMA; 01/03/2015 12:11 PM) 01/03/2015 12:10 PM Weight: 199 lb Height: 65in Body Surface Area: 2.03 m Body Mass Index: 33.11 kg/m Temp.: 96.38F(Temporal)  Pulse: 70 (Regular)  Resp.: 16 (Unlabored)  BP: 128/72 (Sitting, Left Arm, Standard)    Physical Exam [name] T. Kealohilani Maiorino MD; 01/03/2015 12:50 PM) The physical exam findings are as follows: Note:General: Alert, well-developed and well nourished Caucasian female, in no distress Skin: Warm and dry without rash or infection. HEENT: No palpable masses or thyromegaly. Sclera nonicteric. Pupils equal round and reactive. Lymph nodes: No cervical, supraclavicular, or inguinal nodes palpable. Breasts: I cannot feel any masses in either breast. There is some minimal palpable mobile left axillary adenopathy Lungs: Breath sounds clear and equal. No wheezing or increased work of breathing. Cardiovascular: Regular rate and rhythm without murmer. No JVD or edema. Peripheral pulses intact. No carotid bruits. Neurologic: Alert and fully oriented. Gait normal. No focal weakness. Psychiatric: Normal mood somewhat depressed affect.  Thought content appropriate with normal judgement and insight    Assessment & Plan [name] T. 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 Recent scans show resolution of her vertebral disease as well as resolution of her breast mass and improvement in axillary adenopathy. We discussed surgical therapy today and as we had discussed in multidisciplinary cancer conference I recommended proceeding with left breast lumpectomy and left axillary dissection. We discussed the indications and nature of the surgery as well as overnight hospitalization and recovery and risks of bleeding, infection, positive margins, lymphedema. She would like her Port-A-Cath removed and I will discuss this with Dr. Sonny Dandy. Current Plans  Schedule for Surgery Wire localized left breast lumpectomy and left axillary dissection with possible removal of Port-A-Cath if okay with her medical oncologist

## 2015-01-30 NOTE — Transfer of Care (Signed)
Immediate Anesthesia Transfer of Care Note  Patient: Kimberly Reed  Procedure(s) Performed: Procedure(s): BREAST LUMPECTOMY WITH NEEDLE LOCALIZATION LEFT AXILLARY DISSECTION REMOVAL OF PORT (Left)  Patient Location: PACU  Anesthesia Type:GA combined with regional for post-op pain  Level of Consciousness: awake, alert  and oriented  Airway & Oxygen Therapy: Patient Spontanous Breathing and Patient connected to face mask oxygen  Post-op Assessment: Report given to RN and Post -op Vital signs reviewed and stable  Post vital signs: Reviewed and stable  Last Vitals:  Filed Vitals:   01/30/15 1129  BP:   Pulse: 56  Temp:   Resp: 12    Complications: No apparent anesthesia complications

## 2015-01-30 NOTE — Op Note (Signed)
Preoperative Diagnosis: cancer left breast   Postoprative Diagnosis: cancer left breast   Procedure: Procedure(s): BREAST LUMPECTOMY WITH NEEDLE LOCALIZATION LEFT AXILLARY DISSECTION REMOVAL OF PORT   Surgeon: [name] T   Assistants: None  Anesthesia:  General LMA anesthesia  Indications: Patient is a 59 year old female with a diagnosis of number of months ago a stage IV cancer of the left breast presenting with in situ and invasive cancer of the left breast in an area of calcifications measuring about 5 cm with a similar area of enhancement on MRI. She had enlarged axillary lymph nodes also positive on biopsy and a single metastatic lesion in the vertebral body biopsy positive. The patient has undergone chemotherapy with clinical resolution of the metastatic areas in her spine and normalization of her breast MRI and significant reduction in size of lymph nodes in the left axilla. After discussion with the patient in a multidisciplinary conference we have elected to proceed with local surgical therapy with left breast lumpectomy and left axillary dissection and also remove her Port-A-Cath. I discussed the procedure in detail with the patient including the indications and nature of the surgery and risks of bleeding, infection, neurovascular injury and possible need for further surgery based on final pathology.    Procedure Detail:  Preoperatively the patient had undergone bracketing of the area of calcifications in the upper outer left breast with 3 wires. She was then brought to the operating room, placed in the supine position on the operating table, and laryngeal mask general anesthesia induced. She received preoperative IV antibiotics. PAS were in place. The entire left breast and chest, axilla and upper arm as well as the right chest were widely sterilely prepped and draped. Patient timeout was performed and correct procedure verified. The lumpectomy was approached initially. I made a  curvilinear incision at the areolar border in the upper outer left breast and dissection was carried down into the subcutaneous tissue. The dissection was then widened out in all directions to include the 3 wires which were placed superiorly, inferiorly and laterally. All wires were brought into the incision. Staying just outside the wires I then excised a generous specimen of breast tissue down around all 3 wires and underneath their tips with cautery. The specimen was inked for margins. Specimen mammography showed the 3 intact wires, the biopsy clip and the calcifications appeared contained in the center of the lumpectomy. This was sent for permanent pathology. Hemostasis was obtained with cautery and the wound irrigated. The deep breast a simultaneous tissue was closed with interrupted 3-0 Vicryl and the skin with subcuticular 4-0 Monocryl and Dermabond.  Attention was turned to the axillary dissection. A transverse incision was made in the left axilla and dissection carried down through the subcutaneous tissue toward the clavipectoral fascia. The dissection was deepened anteriorly down to the anterior border of the pectoralis major and posteriorly down toward the latissimus and some dissection was carried across the junction of talus Bence of the low axilla. The pectoralis major was retracted medially and the clavipectoral fascia incised. Underneath the pectoralis minor and the axillary vein was identified and was carefully protected throughout the remainder of the dissection. Beginning at this level. The fatty tissue was stripped down away from the axillary vein working laterally and dissecting tissue inferiorly. Perforating vessels were The artery and vein were controlled with clips. As the dissection progressed laterally identified the thoracodorsal nerve and vessels and these were protected. The dissection was deepened down to the subscapularis and the long  thoracic nerve identified medially. All tissue  between these 2 structures was swept inferiorly. The second intercostal sensory nerve was divided between clips. There were noted to be several enlarged somewhat firm lymph nodes. Dissection progressed inferiorly and final attachments along the latissimus and serratus were divided and final attachments to the tail of Spence of the breast were divided and the specimen removed. The wound was thoroughly irrigated. A closed suction drain was brought out through a separate stab wound and left in the axillary space. The subcutaneous stitch was closed with interrupted 3-0 Vicryl and the skin closed with staples.  Following this the port was removed. The previous incision on the right anterior chest was used to dissection was carried down sharply onto the port and the capsule entered. The hub and catheter were bluntly dissected out and the catheter removed intact. The sutures were divided and removed and the port removed and the entire device removed intact. The subcutaneous was closed with interrupted 4-0 Monocryl and the skin with subcuticular 4-0 Monocryl and Dermabond. Sponge needle and instrument counts were correct.    Findings: As above  Estimated Blood Loss:  Minimal         Drains: 19 round Blake in the left axilla  Blood Given: none          Specimens: #1 left breast lumpectomy     #2 left axillary contents        Complications:  * No complications entered in OR log *         Disposition: PACU - hemodynamically stable.         Condition: stable

## 2015-01-30 NOTE — Progress Notes (Signed)
Marda Stalker RN in OR spoke with Dr [name], and consent order obtained and written by her.

## 2015-01-30 NOTE — Interval H&P Note (Signed)
History and Physical Interval Note:  01/30/2015 11:04 AM  Kimberly Reed  has presented today for surgery, with the diagnosis of cancer left breast   The various methods of treatment have been discussed with the patient and family. After consideration of risks, benefits and other options for treatment, the patient has consented to  Procedure(s): BREAST LUMPECTOMY WITH NEEDLE LOCALIZATION andLEFT AXILLARY DISSECTION (Left) and removal of Port-A-Cath as a surgical intervention .  The patient's history has been reviewed, patient examined, no change in status, stable for surgery.  I have reviewed the patient's chart and labs.  Questions were answered to the patient's satisfaction.     [name] T

## 2015-01-30 NOTE — Progress Notes (Signed)
Assisted Dr. Massagee with left, ultrasound guided, pectoralis block. Side rails up, monitors on throughout procedure. See vital signs in flow sheet. Tolerated Procedure well. 

## 2015-01-30 NOTE — Anesthesia Procedure Notes (Addendum)
Anesthesia Regional Block:  Pectoralis block  Pre-Anesthetic Checklist: ,, timeout performed, Correct Patient, Correct Site, Correct Laterality, Correct Procedure, Correct Position, site marked, Risks and benefits discussed,  Surgical consent,  Pre-op evaluation,  At surgeon's request and post-op pain management  Laterality: Left and Upper  Prep: chloraprep       Needles:      Needle Length: 9cm 9 cm Needle Gauge: 21 and 21 G    Additional Needles: Pectoralis block Narrative:  Start time: 01/30/2015 11:05 AM End time: 01/30/2015 11:20 AM Injection made incrementally with aspirations every 5 mL.  Performed by: Personally  Anesthesiologist: MASSAGEE, TERRY  Additional Notes: Tolerated well   Procedure Name: LMA Insertion Date/Time: 01/30/2015 11:39 AM Performed by: Maryella Shivers Pre-anesthesia Checklist: Patient identified, Emergency Drugs available, Suction available and Patient being monitored Patient Re-evaluated:Patient Re-evaluated prior to inductionOxygen Delivery Method: Circle System Utilized Preoxygenation: Pre-oxygenation with 100% oxygen Intubation Type: IV induction Ventilation: Mask ventilation without difficulty LMA: LMA inserted LMA Size: 4.0 Number of attempts: 1 Airway Equipment and Method: Bite block Placement Confirmation: positive ETCO2 Tube secured with: Tape Dental Injury: Teeth and Oropharynx as per pre-operative assessment

## 2015-01-31 ENCOUNTER — Encounter (HOSPITAL_BASED_OUTPATIENT_CLINIC_OR_DEPARTMENT_OTHER): Payer: Self-pay | Admitting: General Surgery

## 2015-01-31 DIAGNOSIS — C50412 Malignant neoplasm of upper-outer quadrant of left female breast: Secondary | ICD-10-CM | POA: Diagnosis not present

## 2015-01-31 NOTE — Discharge Instructions (Addendum)
[facility] [place] °[phone] ° °MASTECTOMY: POST OP INSTRUCTIONS ° °Always review your discharge instruction sheet given to you by the facility where your surgery was performed. °IF YOU HAVE DISABILITY OR FAMILY LEAVE FORMS, YOU MUST BRING THEM TO THE OFFICE FOR PROCESSING.   °DO NOT GIVE THEM TO YOUR DOCTOR. °A prescription for pain medication may be given to you upon discharge.  Take your pain medication as prescribed, if needed.  If narcotic pain medicine is not needed, then you may take acetaminophen (Tylenol) or ibuprofen (Advil) as needed. °1. Take your usually prescribed medications unless otherwise directed. °2. If you need a refill on your pain medication, please contact your pharmacy.  They will contact our office to request authorization.  Prescriptions will not be filled after 5pm or on week-ends. °3. You should follow a light diet the first few days after arrival home, such as soup and crackers, etc.  Resume your normal diet the day after surgery. °4. Most patients will experience some swelling and bruising on the chest and underarm.  Ice packs will help.  Swelling and bruising can take several days to resolve.  °5. It is common to experience some constipation if taking pain medication after surgery.  Increasing fluid intake and taking a stool softener (such as Colace) will usually help or prevent this problem from occurring.  A mild laxative (Milk of Magnesia or Miralax) should be taken according to package instructions if there are no bowel movements after 48 hours. °6. Unless discharge instructions indicate otherwise, leave your bandage dry and in place until your next appointment in 3-5 days.  You may take a limited sponge bath.  No tube baths or showers until the drains are removed.  You may have steri-strips (small skin tapes) in place directly over the incision.  These strips should be left on the skin for 7-10 days.  If your surgeon used skin glue on the incision, you may  shower in 24 hours.  The glue will flake off over the next 2-3 weeks.  Any sutures or staples will be removed at the office during your follow-up visit. °7. DRAINS:  If you have drains in place, it is important to keep a list of the amount of drainage produced each day in your drains.  Before leaving the hospital, you should be instructed on drain care.  Call our office if you have any questions about your drains. °8. ACTIVITIES:  You may resume regular (light) daily activities beginning the next day--such as daily self-care, walking, climbing stairs--gradually increasing activities as tolerated.  You may have sexual intercourse when it is comfortable.  Refrain from any heavy lifting or straining until approved by your doctor. °a. You may drive when you are no longer taking prescription pain medication, you can comfortably wear a seatbelt, and you can safely maneuver your car and apply brakes. °b. RETURN TO WORK:  __________________________________________________________ °9. You should see your doctor in the office for a follow-up appointment approximately 3-5 days after your surgery.  Your doctor’s nurse will typically make your follow-up appointment when she calls you with your pathology report.  Expect your pathology report 2-3 business days after your surgery.  You may call to check if you do not hear from us after three days.   °10. OTHER INSTRUCTIONS: ______________________________________________________________________________________________ ____________________________________________________________________________________________ ° °WHEN TO CALL YOUR DOCTOR: °1. Fever over 101.0 °2. Nausea and/or vomiting °3. Extreme swelling or bruising °4. Continued bleeding from incision. °5. Increased pain, redness, or drainage from the   incision. ° °The clinic staff is available to answer your questions during regular business hours.  Please don’t hesitate to call and ask to speak to one of the nurses for clinical  concerns.  If you have a medical emergency, go to the nearest emergency room or call 911.  A surgeon from [facility] is always on call at the hospital. °[address], [address], [place], [place]  [address] ? [address] [address], [place], [place]   [address] °[phone] ? [phone] ? FAX [phone] °Web site: [facility] ° °About my Jackson-Pratt Bulb Drain ° °What is a Jackson-Pratt bulb? °A Jackson-Pratt is a soft, round device used to collect drainage. It is connected to a long, thin drainage catheter, which is held in place by one or two small stiches near your surgical incision site. When the bulb is squeezed, it forms a vacuum, forcing the drainage to empty into the bulb. ° °Emptying the Jackson-Pratt bulb- °To empty the bulb: °1. Release the plug on the top of the bulb. °2. Pour the bulb's contents into a measuring container which your nurse will provide. °3. Record the time emptied and amount of drainage. Empty the drain(s) as often as your     doctor or nurse recommends. ° °Date                  Time                    Amount (Drain 1)                 Amount (Drain 2) ° °_____________________________________________________________________ ° °_____________________________________________________________________ ° °_____________________________________________________________________ ° °_____________________________________________________________________ ° °_____________________________________________________________________ ° °_____________________________________________________________________ ° °_____________________________________________________________________ ° °_____________________________________________________________________ ° °Squeezing the Jackson-Pratt Bulb- °To squeeze the bulb: °1. Make sure the plug at the top of the bulb is open. °2. Squeeze the bulb tightly in your fist. You will hear air squeezing from the bulb. °3. Replace the plug while the bulb is squeezed. °4.  Use a safety pin to attach the bulb to your clothing. This will keep the catheter from     pulling at the bulb insertion site. ° °When to call your doctor- °Call your doctor if: °· Drain site becomes red, swollen or hot. °· You have a fever greater than 101 degrees F. °· There is oozing at the drain site. °· Drain falls out (apply a guaze bandage over the drain hole and secure it with tape). °· Drainage increases daily not related to activity patterns. (You will usually have more drainage when you are active than when you are resting.) °· Drainage has a bad odor. ° °

## 2015-02-04 ENCOUNTER — Telehealth: Payer: Self-pay | Admitting: Hematology and Oncology

## 2015-02-04 NOTE — Telephone Encounter (Signed)
Now patient has called and moved the appointment back to 4/13

## 2015-02-04 NOTE — Telephone Encounter (Signed)
Patient called in and left a message to reschedule her appointment as she has just had surgery,i left a message with a new appointment

## 2015-02-05 ENCOUNTER — Other Ambulatory Visit: Payer: Self-pay | Admitting: Surgery

## 2015-02-05 DIAGNOSIS — R1084 Generalized abdominal pain: Secondary | ICD-10-CM

## 2015-02-06 ENCOUNTER — Encounter: Payer: Self-pay | Admitting: *Deleted

## 2015-02-06 ENCOUNTER — Telehealth: Payer: Self-pay | Admitting: Hematology and Oncology

## 2015-02-06 ENCOUNTER — Other Ambulatory Visit: Payer: Self-pay | Admitting: *Deleted

## 2015-02-06 ENCOUNTER — Ambulatory Visit: Payer: Managed Care, Other (non HMO) | Admitting: Hematology and Oncology

## 2015-02-06 ENCOUNTER — Ambulatory Visit (HOSPITAL_BASED_OUTPATIENT_CLINIC_OR_DEPARTMENT_OTHER): Payer: Managed Care, Other (non HMO) | Admitting: Hematology and Oncology

## 2015-02-06 VITALS — BP 93/54 | HR 59 | Temp 97.4°F | Resp 18 | Ht 65.0 in | Wt 201.7 lb

## 2015-02-06 DIAGNOSIS — C7951 Secondary malignant neoplasm of bone: Secondary | ICD-10-CM

## 2015-02-06 DIAGNOSIS — C50412 Malignant neoplasm of upper-outer quadrant of left female breast: Secondary | ICD-10-CM | POA: Diagnosis not present

## 2015-02-06 DIAGNOSIS — C773 Secondary and unspecified malignant neoplasm of axilla and upper limb lymph nodes: Secondary | ICD-10-CM | POA: Diagnosis not present

## 2015-02-06 NOTE — Progress Notes (Signed)
Met with pt during post op visit with Dr. Lindi Adie. Denies needs or concerns at this time. Encourage pt to call with questions. Received verbal understanding. Referral placed for out pt PT for baseline evaluation.

## 2015-02-06 NOTE — Telephone Encounter (Signed)
per pof to sch pt appt-gave pt copy of sch °

## 2015-02-06 NOTE — Assessment & Plan Note (Signed)
Metastatic breast cancer in the left breast involving left axillary lymph nodes and isolated L1 vertebral metastases stage IV disease, ER 100 percent, PR 4%, HER-2 negative ratio 1.3 Ki-67 15%  Chemotherapy summary: Completed dose dense Adriamycin Cytoxan 4 started 07/26/2014. Completed 12 weeks of Taxol by 12/18/2014.   Pathology review: Left breast lumpectomy 01/30/2015: Invasive ductal carcinoma grade 2, 1.2 cm, 12/15 lymph nodes positive, extracapsular tumor extension present, T1c N3M1 stage IV   Treatment plan: 1. Adjuvant radiation therapy followed by 2. Letrozole plus Palbociclib   Letrozole counseling: We discussed the risks and benefits of anti-estrogen therapy with aromatase inhibitors. These include but not limited to insomnia, hot flashes, mood changes, vaginal dryness, bone density loss, and weight gain. Although rare, serious side effects including endometrial cancer, risk of blood clots were also discussed. We strongly believe that the benefits far outweigh the risks. Patient understands these risks and consented to starting treatment.   Ibrance counseling: I discussed the risks and benefits of Ibrance including myelosuppression especially neutropenia and with that risk of infection, there is risk of pulmonary embolism and mild peripheral neuropathy as well. Fatigue, nausea, diarrhea, decreased appetite as well as alopecia and thrombocytopenia are also potential side effects of Ibrance.

## 2015-02-06 NOTE — Progress Notes (Signed)
Patient Care Team: Burnis Medin, MD as PCP - General (Internal Medicine) Nicholas Lose, MD as Consulting Physician (Hematology and Oncology) [name], MD as Consulting Physician (General Surgery) Eppie Gibson, MD as Attending Physician (Radiation Oncology)  DIAGNOSIS: Breast cancer of upper-outer quadrant of left female breast   Staging form: Breast, AJCC 7th Edition     Clinical: Stage IV (T2, N1, M1) - Signed by Rulon Eisenmenger, MD on 08/16/2014       Staging comments: Staged at breast conference 06/06/14.      Pathologic: No stage assigned - Unsigned   SUMMARY OF ONCOLOGIC HISTORY:   Breast cancer of upper-outer quadrant of left female breast   05/18/2014 Mammogram Suspicious left upper outer quadrant 5 cm segmental area of calcifications.    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   05/30/2014 Breast MRI Left Breast: 12oclock: 21 x 23 x 30 mm, Numerous  level 1 and 2 LN; Liver lesions cycts by Liver MRI   06/07/2014 Initial Biopsy Left axillary lymph node biopsy invasive ductal carcinoma ER 100%, PR 11% Ki-67 15% HER-2 negative ratio 1.41   06/13/2014 PET scan left breast activity, hypermetabolic left axillary and left retropectoral lymph node, small lucent lesion in L1 vertebra which was biopsied and proven to be bone metastases   07/19/2014 Initial Biopsy Biopsy of L1 vertebra metastatic carcinoma breast primary, ER 100%, PR 4%, HER-2 negative ratio 1.3   07/26/2014 - 12/18/2014 Neo-Adjuvant Chemotherapy Even though she has L1 solitary bone metastases, patient is being treated definitively with neoadjuvant chemotherapy with dose dense Adriamycin and Cytoxan to be followed by weekly Taxol x12   12/25/2014 PET scan Interval improvement in size and metabolic activity associated with left axillary lymph nodes consistent with treatment response, no hypermetabolic distant metastases, L1 vertebral body has  no activity   12/25/2014 Breast MRI No residual enhancement left breast, decreased left axillary lymphadenopathy, indicating response to treatment but lymph nodes are still present   01/30/2015 Surgery Left breast lumpectomy: Invasive ductal carcinoma grade 2, 1.2 cm, 12/15 lymph nodes positive, extracapsular tumor extension present, T1c N3M1 stage IV     CHIEF COMPLIANT: Follow-up after recent lumpectomy  INTERVAL HISTORY: Kimberly Reed is a 59-year-old with above-mentioned history of left breasts cancer with lymph node involvement were completed neoadjuvant chemotherapy and recently underwent a lumpectomy. She is here today for follow-up. She reports that the breast and axilla are still sore. She still has drain. Denies any other complaints or concerns. She reports that the hair is coming back.  REVIEW OF SYSTEMS:   Constitutional: Denies fevers, chills or abnormal weight loss Eyes: Denies blurriness of vision Ears, nose, mouth, throat, and face: Denies mucositis or sore throat Respiratory: Denies cough, dyspnea or wheezes Cardiovascular: Denies palpitation, chest discomfort or lower extremity swelling Gastrointestinal:  Denies nausea, heartburn or change in bowel habits Skin: Denies abnormal skin rashes Lymphatics: Denies new lymphadenopathy or easy bruising Neurological:Denies numbness, tingling or new weaknesses Behavioral/Psych: Mood is stable, no new changes  Breast: Pain and discomfort related to recent surgery All other systems were reviewed with the patient and are negative.  I have reviewed the past medical history, past surgical history, social history and family history with the patient and they are unchanged from previous note.  ALLERGIES:  is allergic to other and effexor.  MEDICATIONS:  Current Outpatient Prescriptions  Medication Sig Dispense Refill  . ALPRAZolam (XANAX) 0.5 MG tablet Take 1  tablet (0.5 mg total) by mouth 3 (three) times daily as needed for anxiety. 60  tablet 3  . brimonidine-timolol (COMBIGAN) 0.2-0.5 % ophthalmic solution Place 1 drop into the right eye every 12 (twelve) hours.     . dorzolamide (TRUSOPT) 2 % ophthalmic solution Place 1 drop into the right eye 2 (two) times daily.     [name] levothyroxine (SYNTHROID) 100 MCG tablet Take 1 tablet (100 mcg total) by mouth daily before breakfast. 90 tablet 3  . ondansetron (ZOFRAN-ODT) 4 MG disintegrating tablet     . oxyCODONE-acetaminophen (ROXICET) 5-325 MG per tablet Take 1-2 tablets by mouth every 4 (four) hours as needed for severe pain. 40 tablet 0  . Travoprost, BAK Free, (TRAVATAN) 0.004 % SOLN ophthalmic solution Place 1 drop into the right eye at bedtime.     [name] OVER THE COUNTER MEDICATION Take 1 capsule by mouth daily.     No current facility-administered medications for this visit.   Facility-Administered Medications Ordered in Other Visits  Medication Dose Route Frequency Provider Last Rate Last Dose  . sodium chloride 0.9 % injection 10 mL  10 mL Intracatheter PRN Nicholas Lose, MD   10 mL at 10/12/14 1359  . sodium chloride 0.9 % injection 10 mL  10 mL Intracatheter PRN Nicholas Lose, MD   10 mL at 12/11/14 1653    PHYSICAL EXAMINATION: ECOG PERFORMANCE STATUS: 1 - Symptomatic but completely ambulatory  Filed Vitals:   02/06/15 1007  BP: 93/54  Pulse:   Temp:   Resp:    Filed Weights   02/06/15 1006  Weight: 201 lb 11.2 oz (91.491 kg)    GENERAL:alert, no distress and comfortable SKIN: skin color, texture, turgor are normal, no rashes or significant lesions EYES: normal, Conjunctiva are pink and non-injected, sclera clear OROPHARYNX:no exudate, no erythema and lips, buccal mucosa, and tongue normal  NECK: supple, thyroid normal size, non-tender, without nodularity LYMPH:  no palpable lymphadenopathy in the cervical, axillary or inguinal LUNGS: clear to auscultation and percussion with normal breathing effort HEART: regular rate & rhythm and no murmurs and no lower  extremity edema ABDOMEN:abdomen soft, non-tender and normal bowel sounds Musculoskeletal:no cyanosis of digits and no clubbing  NEURO: alert & oriented x 3 with fluent speech, no focal motor/sensory deficits  LABORATORY DATA:  I have reviewed the data as listed   Chemistry      Component Value Date/Time   NA 140 01/16/2015 1000   NA 139 12/18/2014 1046   K 4.9 01/16/2015 1000   K 4.0 12/18/2014 1046   CL 104 01/16/2015 1000   CO2 30 01/16/2015 1000   CO2 26 12/18/2014 1046   BUN 19 01/16/2015 1000   BUN 13.8 12/18/2014 1046   CREATININE 0.71 01/16/2015 1000   CREATININE 0.7 12/18/2014 1046   CREATININE 0.73 03/05/2014 1044      Component Value Date/Time   CALCIUM 10.1 01/16/2015 1000   CALCIUM 9.8 12/18/2014 1046   ALKPHOS 86 01/16/2015 1000   ALKPHOS 75 12/18/2014 1046   AST 15 01/16/2015 1000   AST 20 12/18/2014 1046   ALT 12 01/16/2015 1000   ALT 27 12/18/2014 1046   BILITOT 0.4 01/16/2015 1000   BILITOT 0.25 12/18/2014 1046       Lab Results  Component Value Date   WBC 4.7 01/16/2015   HGB 11.8* 01/30/2015   HCT 34.0* 01/16/2015   MCV 88.8 01/16/2015   PLT 320.0 01/16/2015   NEUTROABS 2.9 01/16/2015    ASSESSMENT &  PLAN:  Breast cancer of upper-outer quadrant of left female breast Metastatic breast cancer in the left breast involving left axillary lymph nodes and isolated L1 vertebral metastases stage IV disease, ER 100 percent, PR 4%, HER-2 negative ratio 1.3 Ki-67 15%  Chemotherapy summary: Completed dose dense Adriamycin Cytoxan 4 started 07/26/2014. Completed 12 weeks of Taxol by 12/18/2014.   Pathology review: Left breast lumpectomy 01/30/2015: Invasive ductal carcinoma grade 2, 1.2 cm, 12/15 lymph nodes positive several of the nodes have very small metastatic deposits, extracapsular tumor extension present, T1c N3M1 stage IV   Treatment plan: 1. Adjuvant radiation therapy followed by 2. Letrozole plus Palbociclib  3. X Geva every 3  months  Letrozole counseling: We discussed the risks and benefits of anti-estrogen therapy with aromatase inhibitors. These include but not limited to insomnia, hot flashes, mood changes, vaginal dryness, bone density loss, and weight gain. Although rare, serious side effects including endometrial cancer, risk of blood clots were also discussed. We strongly believe that the benefits far outweigh the risks. Patient understands these risks and consented to starting treatment.   Ibrance counseling: I discussed the risks and benefits of Ibrance including myelosuppression especially neutropenia and with that risk of infection, there is risk of pulmonary embolism and mild peripheral neuropathy as well. Fatigue, nausea, diarrhea, decreased appetite as well as alopecia and thrombocytopenia are also potential side effects of Ibrance.  I recommended that she follow-up with [place] in 2 months for initiation of antiestrogen therapy.  No orders of the defined types were placed in this encounter.   The patient has a good understanding of the overall plan. she agrees with it. She will call with any problems that may develop before her next visit here.   Rulon Eisenmenger, MD

## 2015-02-08 ENCOUNTER — Encounter: Payer: Self-pay | Admitting: Radiation Oncology

## 2015-02-08 NOTE — Progress Notes (Signed)
Location of Breast Cancer: left upper, outer  Histology per Pathology Report:  01/30/15 Diagnosis 1. Breast, lumpectomy, left - INVASIVE DUCTAL CARCINOMA, SEE COMMENT. - DUCTAL CARCINOMA IN SITU. - IN SITU CARCINOMA IS 2 MM FROM NEAREST MARGIN (MEDIAL). - POSITIVE FOR LYMPH VASCULAR INVASION. - PREVIOUS BIOPSY SITE. - SEE TUMOR SYNOPTIC TEMPLATE BELOW 2. Lymph nodes, regional resection, left axillary - TWELVE LYMPH NODES, POSITIVE FOR METASTATIC MAMMARY CARCINOMA (12/15). - INTRANODAL TUMOR DEPOSITS ARE 2 MM, 4 MM, 7 MM, 4 MM, 4 MM , 7 MM, 1 MM, 4 MM, 1.7 CM, 1.6 CM, 2.0 CM AND 2.5 CM - POSITIVE FOR EXTRACAPSULAR TUMOR EXTENSION.  Receptor Status: ER(100%), PR (11%), Her2-neu (-)  Did patient present with symptoms (if so, please note symptoms) or was this found on screening mammography?: screening mammogram   Past/Anticipated interventions by surgeon, if any: Dr [name]:  biopsy, lumpectomy, left axillary resection  Past/Anticipated interventions by medical oncology, if any: Chemotherapy Dr Lindi Adie:  07/26/2014 - 12/18/2014 Neo-Adjuvant Chemotherapy Even though she has L1 solitary bone metastases, patient is being treated definitively with neoadjuvant chemotherapy with dose dense Adriamycin and Cytoxan to be followed by weekly Taxol x12      Treatment plan: 1. Adjuvant radiation therapy followed by 2. Letrozole plus Palbociclib  3. X Geva every 3 months  Lymphedema issues, if any:      Pain issues, if any:     SAFETY ISSUES:  Prior radiation? no  Pacemaker/ICD? no  Possible current pregnancy? no  Is the patient on methotrexate? no  Current Complaints / other details:  Never pregnant, no HRT    Andria Rhein, RN 02/08/2015,2:29 PM

## 2015-02-11 ENCOUNTER — Other Ambulatory Visit: Payer: Self-pay | Admitting: Hematology and Oncology

## 2015-02-11 NOTE — Telephone Encounter (Signed)
Last OV 02/06/15.  Next OV 04/16/15.  Confirmed with Dr. Lindi Adie.  Phoned in to pharmacy.

## 2015-02-12 NOTE — Progress Notes (Signed)
Radiation Oncology         [phone] ________________________________  Name: Kimberly Reed MRN: [id number]  Date: 02/13/2015  DOB: 24-Dec-1955  Follow-Up Visit Note  Outpatient  CC: [name], MD  [name], MD  Diagnosis:   clinical T2N1M1;  ypT1c, ypN3, pM1 STAGE IV Left breast Invasive ductal carcinoma  Narrative:  The patient returns today for follow-up.   Since consult, staging scans including PET showed oligometastatic disease at L1.  This was the only site of proven metastatic disease distantly.  Of note, Brain MRI was negative and liver lesions appear benign. This was biopsied on 9-24 revealing metastatic breast carcinoma.  After neoadjuvant chemotherapy, repeat PET on 3-1 revealed improvement in her locoregional disease and no evidence of metastatic distant disease.  She underwent surgery in the form of left lumpectomy and ALND on 02-01-15.  Pathology revealed 12/15 positive nodes, with at least  1.2cm of residual tumor and negative margins. Her disease is strongly ER positive.  Postoperatively, she has decreased ROM in left arm. She feels depressed (chronic) and overwhelmed by returning to work in May.  Financial distress.  Believes she cannot proceed with RT due to these stressors/barriers. PT appointment in ~1week.                   ALLERGIES:  is allergic to other and effexor.  Meds: Current Outpatient Prescriptions  Medication Sig Dispense Refill  . ALPRAZolam (XANAX) 0.5 MG tablet TAKE 1 TABLET BY MOUTH THREE TIMES DAILY AS NEEDED 60 tablet 2  . brimonidine-timolol (COMBIGAN) 0.2-0.5 % ophthalmic solution Place 1 drop into the right eye every 12 (twelve) hours.     . dorzolamide (TRUSOPT) 2 % ophthalmic solution Place 1 drop into the right eye 2 (two) times daily.     [name] levothyroxine (SYNTHROID) 100 MCG tablet Take 1 tablet (100 mcg total) by mouth daily before breakfast. 90 tablet 3  . ondansetron (ZOFRAN-ODT) 4 MG disintegrating tablet     . OVER  THE COUNTER MEDICATION Take 1 capsule by mouth daily.    [name] oxyCODONE-acetaminophen (ROXICET) 5-325 MG per tablet Take 1-2 tablets by mouth every 4 (four) hours as needed for severe pain. 40 tablet 0  . Travoprost, BAK Free, (TRAVATAN) 0.004 % SOLN ophthalmic solution Place 1 drop into the right eye at bedtime.      No current facility-administered medications for this encounter.   Facility-Administered Medications Ordered in Other Encounters  Medication Dose Route Frequency Provider Last Rate Last Dose  . sodium chloride 0.9 % injection 10 mL  10 mL Intracatheter PRN Nicholas Lose, MD   10 mL at 10/12/14 1359  . sodium chloride 0.9 % injection 10 mL  10 mL Intracatheter PRN Nicholas Lose, MD   10 mL at 12/11/14 1653    Physical Findings: The patient is in no acute distress. Patient is alert and oriented. .     Vitals - 1 value per visit 4/33/2951  SYSTOLIC 884  DIASTOLIC 64  Pulse 67  Temperature 98  Respirations 16  Weight (lb) 202.6  Height   BMI 33.71  VISIT REPORT    General: Alert and oriented, Flat affect HEENT: Head is normocephalic. Extraocular movements are intact. Oropharynx is clear. Neck: Neck is supple, no palpable cervical or supraclavicular lymphadenopathy. Heart: Regular in rate and rhythm with no murmurs, rubs, or gallops. Chest: Clear to auscultation bilaterally, with no rhonchi, wheezes, or rales.   Extremities: No cyanosis or edema.   Psychiatric: flat affect  Left breast/axilla healing from surgery. Neosporin and bandaids provided for moistness at medial axillary scar.   Lab Findings: Lab Results  Component Value Date   WBC 4.7 01/16/2015   HGB 11.8* 01/30/2015   HCT 34.0* 01/16/2015   MCV 88.8 01/16/2015   PLT 320.0 01/16/2015       Radiographic Findings: Mm Breast Surgical Specimen  01/30/2015   CLINICAL DATA:  Specimen mammogram following mammographically guided needle localization and surgical excision of left breast calcifications.  EXAM:  SPECIMEN RADIOGRAPH OF THE LEFT BREAST  COMPARISON:  Previous exam(s).  FINDINGS: Status post excision of the left breast. The wire tip and biopsy marker clip are present and are marked for pathology. Calcifications are most numerous in the central aspect of the specimen.  IMPRESSION: Specimen radiograph of the left breast.   Electronically Signed   By: Lajean Manes M.D.   On: 01/30/2015 12:50   Mm Lt Plc Breast Loc Dev   1st Lesion  Inc Mammo Guide  01/30/2015   CLINICAL DATA:  Patient with left breast cancer for preoperative localization.  EXAM: NEEDLE LOCALIZATION OF THE LEFT BREAST WITH MAMMO GUIDANCE  COMPARISON:  Previous exams.  FINDINGS: Patient presents for needle localization prior to left breast lumpectomy. I met with the patient and we discussed the procedure of needle localization including benefits and alternatives. We discussed the high likelihood of a successful procedure. We discussed the risks of the procedure, including infection, bleeding, tissue injury, and further surgery. Informed, written consent was given. The usual time-out protocol was performed immediately prior to the procedure.  Using mammographic guidance, sterile technique, 2% lidocaine and 7 cm, 5 cm and 5 cm modified Kopans needles, calcifications within lateral left breast were localized using cranial approach. The images were marked for Dr. [name].  IMPRESSION: Needle localization left breast. No apparent complications.   Electronically Signed   By: Lovey Newcomer M.D.   On: 01/30/2015 15:50    Impression/Plan:  Patient discussed at tumor board this AM.  She and I discussed adjuvant radiotherapy today.  I recommend adjuvant RT to the left breast and regional nodes in order to decrease risk of locoregional recurrence by 2/3.  We also discussed treating the L1 region with stereotactic radiosurgery given that this is oligometastatic disease.  We discussed literature which implies that aggressive management of oligometastatic  disease may improve outcomes for carefully selected patients.  The risks, benefits and side effects of this treatment were discussed in detail.  She understands that a 6 week course of nodal/breast radiotherapy is associated with skin irritation and fatigue in the acute setting. Late effects can include cosmetic changes, lymphedema and rare injury to internal organs.  SRS is often given in one fraction with a small risk of spinal cord injury.   A consent form was signed for breast RT today. She feels VERY discouraged and overwhelmed and I am not sure she will be willing/able to proceed with all treatments while balancing her other stressors.  For this reason, Johnnye Lana of Social Work will see her today.  Financial counselors were notified.  Simulation tentatively scheduled for the breast treatments in May.  Will hold off on SRS to spine only if patient tolerates RT to breast reasonably well.  _____________________________________   Eppie Gibson, MD

## 2015-02-13 ENCOUNTER — Ambulatory Visit
Admission: RE | Admit: 2015-02-13 | Discharge: 2015-02-13 | Disposition: A | Discharge status: Home / Self Care | Payer: Managed Care, Other (non HMO) | Source: Ambulatory Visit | Attending: Radiation Oncology | Admitting: Radiation Oncology

## 2015-02-13 ENCOUNTER — Encounter: Payer: Self-pay | Admitting: *Deleted

## 2015-02-13 ENCOUNTER — Telehealth: Payer: Self-pay | Admitting: Radiation Oncology

## 2015-02-13 ENCOUNTER — Other Ambulatory Visit: Payer: Self-pay

## 2015-02-13 ENCOUNTER — Encounter: Payer: Self-pay | Admitting: Radiation Oncology

## 2015-02-13 VITALS — BP 101/64 | HR 67 | Temp 98.0°F | Resp 16 | Wt 202.6 lb

## 2015-02-13 DIAGNOSIS — F32A Depression, unspecified: Secondary | ICD-10-CM

## 2015-02-13 DIAGNOSIS — C50412 Malignant neoplasm of upper-outer quadrant of left female breast: Secondary | ICD-10-CM

## 2015-02-13 DIAGNOSIS — F329 Major depressive disorder, single episode, unspecified: Secondary | ICD-10-CM

## 2015-02-13 DIAGNOSIS — D0592 Unspecified type of carcinoma in situ of left breast: Secondary | ICD-10-CM

## 2015-02-13 HISTORY — DX: Malignant neoplasm of unspecified site of unspecified female breast: C50.919

## 2015-02-13 MED ORDER — ARIPIPRAZOLE 10 MG PO TABS: 5.0000 mg | ORAL_TABLET | Freq: Every morning | ORAL | Status: DC

## 2015-02-13 NOTE — Progress Notes (Signed)
See progress note under physician encounter. 

## 2015-02-13 NOTE — Telephone Encounter (Signed)
Opened in error

## 2015-02-13 NOTE — Progress Notes (Signed)
Patient seen by Rockwell Alexandria, LCSW to discuss distress level of 10. Also, patient seen by Harvie Heck.

## 2015-02-13 NOTE — Progress Notes (Signed)
Patient here for consultation alone. Patient states, "I had someone helping me through chemotherapy but, I don't have a support system now." Reports persistent pain in her left axilla since her lumpectomy. Patient reports that "she assumes her incisions are healing fine." Patient denies redness, warmth or drainage from incision site. Patient demonstrates limited ROM of left upper extremity. No lymphedema noted in either upper extremity. Reports she has her first [facility] appointment next week. Reports she is scheduled to return to work on May 2. 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

## 2015-02-13 NOTE — Progress Notes (Signed)
[place] Psychosocial Distress Screening Clinical Social Work  Clinical Social Work was referred by distress screening protocol.  The patient scored a 10 on the Psychosocial Distress Thermometer which indicates severe distress. Clinical Social Worker met with patient in the exam room in radiation oncology to assess for distress and other psychosocial needs.  Patient presented with a very flat affect and expressed feeling depressed and overwhelmed.  Patient stated most of her distress was associated with the treatment schedule and her work requirements.  After processing patients concerns and exploring options to accommodate both patients job and treatment, patient was agreeable to asking her supervisor about flexibility in her work schedule.  Patient plans to speak with her supervisor in the next few days.  Patient also expressed financial concerns.  CSW and patient discussed financial assistance resources, but patient stated she did not feel confident they would be able to help her.  Patient did agree to CSW calling if financial resources are identified that could assist patient.  CSW share information with radiation team and will follow up with patient regarding her work schedule and additional needs.  CSw provided contact information and encouraged patient to call with questions or concerns.               ONCBCN DISTRESS SCREENING 02/13/2015  Screening Type Initial Screening  Distress experienced in past week (1-10) 10  Practical problem type Housing;Insurance;Work/school;Transportation;Food  Emotional problem type Depression;Nervousness/Anxiety;Adjusting to illness;Isolation/feeling alone;Feeling hopeless;Boredom;Adjusting to appearance changes  Spiritual/Religous concerns type Relating to God;Loss of Faith;Facing my mortality;Loss of sense of purpose  Information Concerns Type Lack of info about maintaining fitness  Physical Problem type Sleep/insomnia;Getting around;Constipation/diarrhea;Skin dry/itchy   Physician notified of physical symptoms Yes  Referral to clinical psychology No  Referral to clinical social work Yes  Referral to dietition No  Referral to financial advocate No  Referral to support programs Yes  Referral to palliative care No  Other    Johnnye Lana, MSW, LCSW, OSW-C Clinical Social Worker [place] [phone]

## 2015-02-19 ENCOUNTER — Ambulatory Visit: Payer: Managed Care, Other (non HMO) | Attending: Hematology and Oncology | Admitting: Physical Therapy

## 2015-02-19 ENCOUNTER — Ambulatory Visit: Payer: Managed Care, Other (non HMO) | Admitting: Radiation Oncology

## 2015-02-19 DIAGNOSIS — Z9189 Other specified personal risk factors, not elsewhere classified: Secondary | ICD-10-CM | POA: Diagnosis not present

## 2015-02-19 DIAGNOSIS — M25612 Stiffness of left shoulder, not elsewhere classified: Secondary | ICD-10-CM | POA: Insufficient documentation

## 2015-02-19 NOTE — Patient Instructions (Signed)
Cane Overhead - Supine  Hold cane at thighs with both hands, extend arms straight over head. Hold ___ seconds. Repeat ___ times. Do ___ times per day.  External Rotation (Eccentric), Active-Assist - Supine (Cane)  Lie on back, affected arm out from side, elbow at 90, forearm forward. Use cane to assist in lifting forearm of affected arm to neutral. Slowly lower for 3-5 seconds. ___ reps per set, ___ sets per day, ___ days per week. Add ___ lbs when you achieve ___ repetitions.  Copyright  VHI. All rights reserved.

## 2015-02-19 NOTE — Therapy (Addendum)
[place], [place], [address] Phone: [phone]   Fax:  [phone]  Physical Therapy Evaluation  Patient Details  Name: Kimberly Reed MRN: [id number] Date of Birth: [birth year] Referring Provider:  Nicholas Lose, MD  Encounter Date: 02/19/2015      PT End of Session - 02/19/15 1343    Visit Number 1   Number of Visits 9   Date for PT Re-Evaluation 04/25/15  pt unable to schedule at this time, but may be able to in the future   PT Start Time 1255   PT Stop Time 1340   PT Time Calculation (min) 45 min   Activity Tolerance Patient tolerated treatment well   Behavior During Therapy [facility] for tasks assessed/performed      Past Medical History  Diagnosis Date  . Hypothyroid   . Glaucoma   . Hyperlipemia   . Migraine without aura   . History of syncope 2008    loss of bladder control eval by Neuro  . Atypical chest pain   . Family history of heart disease   . Anxiety   . Depression   . Hot flashes   . Anemia   . Insomnia   . Cancer     left breast dx. 2'86- chemo planned to start 07-09-14- Dr. Lindi [facility]  . Wears glasses   . Breast cancer 05/2014    left  . Glaucoma     Past Surgical History  Procedure Laterality Date  . Cataract extraction Left   . Glaucoma surgery Left     x1   . Examination under anesthesia  02/24/2013    Procedure: EXAM UNDER ANESTHESIA;  Surgeon: Azalia Bilis, MD;  Location: [facility];  Service: Gynecology;;  with removal of prolapsing cervical mass; pap smear and endometrial biopsy  . Uterine polyp removed      per hysteroscopy about 1 1/2 yrs ago.  . Esophagogastroduodenoscopy  06/01/14  . Tonsillectomy      child  . Portacath placement Right 07/06/2014    Procedure: PORT A CATH  PLACEMENT;  Surgeon: [name], MD;  Location: [facility];  Service: General;  Laterality: Right;  . Dilation and curettage of uterus    . Breast lumpectomy with  needle localization and axillary lymph node dissection Left 01/30/2015    Procedure: BREAST LUMPECTOMY WITH NEEDLE LOCALIZATION LEFT AXILLARY DISSECTION REMOVAL OF PORT;  Surgeon: [name], MD;  Location: [facility];  Service: General;  Laterality: Left;    There were no vitals filed for this visit.  Visit Diagnosis:  At risk for lymphedema  Shoulder stiffness, left      Subjective Assessment - 02/19/15 1258    Subjective "im getting by"  she feels that her arm is getting better.   Pertinent History 02/01/2015 left lumpectomy with 15 nodes removed  she has had chemotherapy and plans to have radiation therapy  pt reports she has had 80-90# weight loss over past couple of years and has loose skin hanging on arms.  She used to work out at [employer]   Currently in Pain? No/denies  twinges under her arm             [facility] PT Assessment - 02/19/15 0001    Assessment   Medical Diagnosis breast cancer   Precautions   Precautions Other (comment)  cancer   Restrictions   Weight Bearing Restrictions No   Balance Screen   Has the  patient fallen in the past 6 months Yes   How many times? 1  tripped in a parking lot and fell.   Has the patient had a decrease in activity level because of a fear of falling?  No   Is the patient reluctant to leave their home because of a fear of falling?  No   Home Environment   Living Enviornment Private residence   Living Arrangements Alone   Available Help at Discharge Other (Comment)  not really, but she doesn' need help   Type of Home Apartment   Prior Function   Level of Independence Independent with basic ADLs;Independent with homemaking with ambulation;Independent with gait;Independent with transfers   Vocation Other (comment)  gong back to work on [employer] at computer, and on the phone, stessful job   she may not be able to attend physical therapy appoiintments   Leisure pt states  she is too depressed right now to do any walking   Cognition   Overall Cognitive Status Within Functional Limits for tasks assessed  flat affect, pt states she is too depressed   Posture/Postural Control   Posture/Postural Control Postural limitations   Postural Limitations Rounded Shoulders;Forward head   AROM   Right Shoulder Extension 77 Degrees   Right Shoulder Flexion 148 Degrees   Right Shoulder ABduction 150 Degrees   Right Shoulder Internal Rotation 65 Degrees   Right Shoulder External Rotation 80 Degrees   Left Shoulder Extension 75 Degrees   Left Shoulder Flexion 135 Degrees   Left Shoulder ABduction 100 Degrees   Left Shoulder Internal Rotation 65 Degrees   Left Shoulder External Rotation 80 Degrees           LYMPHEDEMA/ONCOLOGY QUESTIONNAIRE - 02/19/15 1317    Right Upper Extremity Lymphedema   10 cm Proximal to Olecranon Process 34.5 cm   Olecranon Process 28.4 cm   15 cm Proximal to Ulnar Styloid Process 27 cm   Just Proximal to Ulnar Styloid Process 16.5 cm   Across Hand at [employer] 19 cm   At [place] of 2nd Digit 6.2 cm   Left Upper Extremity Lymphedema   10 cm Proximal to Olecranon Process 35 cm   Olecranon Process 29 cm   15 cm Proximal to Ulnar Styloid Process 27 cm   Just Proximal to Ulnar Styloid Process 16.5 cm   Across Hand at [employer] 18.5 cm   At [place] of 2nd Digit 6.1 cm           Quick Dash - 02/19/15 0001    Open a tight or new jar No difficulty   Do heavy household chores (wash walls, wash floors) Moderate difficulty   Carry a shopping bag or briefcase Moderate difficulty   Wash your back Moderate difficulty   Use a knife to cut food No difficulty   Recreational activities in which you take some force or impact through your arm, shoulder, or hand (golf, hammering, tennis) Severe difficulty   During the past week, to what extent has your arm, shoulder or hand problem interfered with your normal social activities with family,  friends, neighbors, or groups? Slightly   During the past week, to what extent has your arm, shoulder or hand problem limited your work or other regular daily activities Slightly   Arm, shoulder, or hand pain. Mild   Tingling (pins and needles) in your arm, shoulder, or hand Mild   Difficulty Sleeping Mild difficulty  DASH Score 31.82 %                     PT Education - 02/19/15 1342    Education provided Yes   Education Details shoulder Range of motion to prepare for radiation, class times for ABC Class, monitor skin closely, benefits of slow, progressive strength program   Person(s) Educated Patient   Methods Explanation;Demonstration;Handout   Comprehension Verbalized understanding;Returned demonstration           Short Term Clinic Goals - 02/19/15 1747    CC Short Term Goal  #1   Title short term = long term goals              Long Term Clinic Goals - 02/19/15 1747    CC Long Term Goal  #1   Title pt will verbalize lymphedema risk reduction practices and use of prophylactic compression garments   Time 4   Period Weeks   Status New   CC Long Term Goal  #2   Title Pt will be independent in progressive resistance exercise program   Time 4   Period Weeks   Status New            Plan - 02/19/15 1344    Clinical Impression Statement Pt was under the impression she would only be coming here one time and does not think she can get the time off work to return to physical therapy treatment. She works near [place] and may want to go there if she can.  She has  decreased shoulder range of motion post lumpectomy with ALND., but was able improve some with supine cane exercises and shoulde have enough range for radiation treament.  She does have loose skin and likely muscle loss from weight loss and decondidtioning and is at risk for lymphedema.  She would benefit from  strength ABC insturction to decrease likelihood of lymphedema develpment, and  futher instruction in lymphedema risk reductions.  We talked about getting a class one compression sleeve and gaunttlet for prophylaxis.  She can get this with a prescription from [facility] or  [facility] on her own.    Pt will benefit from skilled therapeutic intervention in order to improve on the following deficits Decreased strength;Decreased range of motion;Decreased knowledge of precautions;Decreased knowledge of use of DME;Decreased skin integrity   Rehab Potential Excellent   Clinical Impairments Affecting Rehab Potential loose skin and likely decreased muscle mass from 80-90 # weight loss, depression with flat affect,          Problem List Patient Active Problem List   Diagnosis Date Noted  . Cancer of left breast 01/30/2015  . Glaucoma 01/23/2015  . Visit for preventive health examination 01/23/2015  . Chemotherapy induced neutropenia 08/02/2014  . Anxiety 07/04/2014  . Anemia due to antineoplastic chemotherapy 06/22/2014  . Depression 06/06/2014  . Breast cancer of upper-outer quadrant of left female breast 05/28/2014  . Snoring 04/26/2014  . Persistent disorder of initiating or maintaining sleep 04/26/2014  . Atypical chest pain 03/22/2014  . Medication side effect 02/19/2014  . Adjustment disorder with mixed anxiety and depressed mood 02/19/2014  . Pain of left heel 11/14/2012  . Memory difficulties 11/14/2012  . Stress at work 11/14/2012  . Plantar fasciitis of left foot 11/14/2012  . Neoplasm of uncertain behavior of skin 07/10/2011  . Infection, skin 07/10/2011  . Family hx of aortic aneurysm 03/16/2011  . HYPERLIPIDEMIA 07/24/2008  . OBESITY,  UNSPECIFIED 07/24/2008  . DYSTHYMIA, CHRONIC 07/24/2008  . PERIMENOPAUSAL SYNDROME 07/24/2008  . FATIGUE 07/24/2008  . HYPOTHYROIDISM 11/29/2007  . GLUCOMA 11/29/2007  . HEADACHE 11/29/2007   Donato Heinz. Owens Shark, PT   Norwood Levo 02/19/2015, 5:49 PM    PHYSICAL THERAPY DISCHARGE SUMMARY  Visits  from Start of Care: 1  Current functional level related to goals / functional outcomes: unknown   Remaining deficits: unknown   Education / Equipment: As above  Plan: Patient agrees to discharge.  Patient goals were not met. Patient is being discharged due to not returning since the last visit.  ?????         Maudry Diego, PT 12/04/2015 1:38 PM  [place], [place], [address] Phone: [phone]   Fax:  [phone]

## 2015-02-20 ENCOUNTER — Ambulatory Visit
Admission: RE | Admit: 2015-02-20 | Discharge: 2015-02-20 | Disposition: A | Discharge status: Home / Self Care | Payer: Managed Care, Other (non HMO) | Source: Ambulatory Visit | Attending: Radiation Oncology | Admitting: Radiation Oncology

## 2015-02-20 DIAGNOSIS — C50412 Malignant neoplasm of upper-outer quadrant of left female breast: Secondary | ICD-10-CM | POA: Diagnosis not present

## 2015-02-20 NOTE — Progress Notes (Signed)
  Radiation Oncology         [phone] ________________________________  Name: Kimberly Reed MRN: [id number]  Date: 02/20/2015  DOB: 1956-03-04  SIMULATION AND TREATMENT PLANNING NOTE  Outpatient  DIAGNOSIS:     ICD-9-CM ICD-10-CM   1. Breast cancer of upper-outer quadrant of left female breast 174.4 C50.412     NARRATIVE:  The patient was brought to the [facility].  Identity was confirmed.  All relevant records and images related to the planned course of therapy were reviewed.  The patient freely provided informed written consent to proceed with treatment after reviewing the details related to the planned course of therapy. The consent form was witnessed and verified by the simulation staff.    Then, the patient was set-up in a stable reproducible supine position for radiation therapy.  CT images were obtained.  I determined that Breathhold technique was not advisable. Surface markings were placed.  The CT images were loaded into the planning software.    TREATMENT PLANNING NOTE: Treatment planning then occurred.  The radiation prescription was entered and confirmed.    A total of 5 medically necessary complex treatment devices were fabricated and supervised by me: 4 fields with MLCs to block heart, lung, as well as a vaclock. I have requested : 3D Simulation  I have requested a DVH of the following structures: heart, lungs, lumpectomy cavity.  I have ordered social work consult.  The patient will receive 50 Gy in 25 fractions to the left breast and regional nodes. Boost of 10 Gy in 5 fractions will follow to the lumpectomy cavity.  Optical Surface Tracking Plan:  Since intensity modulated radiotherapy (IMRT) and 3D conformal radiation treatment methods are predicated on accurate and precise positioning for treatment, intrafraction motion monitoring is medically necessary to ensure accurate and safe treatment delivery. The ability to quantify intrafraction motion  without excessive ionizing radiation dose can only be performed with optical surface tracking. Accordingly, surface imaging offers the opportunity to obtain 3D measurements of patient position throughout IMRT and 3D treatments without excessive radiation exposure. I am ordering optical surface tracking for this patient's upcoming course of radiotherapy.  ________________________________   Reference:  Ursula Alert, J, et al. Surface imaging-based analysis of intrafraction motion for breast radiotherapy patients.Journal of [place], n. 6, nov. 2014. ISSN [id number].  Available at: <http://www.jacmp.org/index.php/jacmp/article/view/4957>.    This document serves as a record of services personally performed by Eppie Gibson, MD. It was created on her behalf by Darcus Austin, a trained medical scribe. The creation of this record is based on the scribe's personal observations and the provider's statements to them. This document has been checked and approved by the attending provider.     -----------------------------------  Eppie Gibson, MD

## 2015-02-22 ENCOUNTER — Other Ambulatory Visit: Payer: Self-pay | Admitting: *Deleted

## 2015-02-22 ENCOUNTER — Telehealth: Payer: Self-pay

## 2015-02-22 ENCOUNTER — Telehealth: Payer: Self-pay | Admitting: *Deleted

## 2015-02-22 DIAGNOSIS — C50412 Malignant neoplasm of upper-outer quadrant of left female breast: Secondary | ICD-10-CM

## 2015-02-22 MED ORDER — ALPRAZOLAM 1 MG PO TABS: 1.0000 mg | ORAL_TABLET | Freq: Three times a day (TID) | ORAL | Status: DC | PRN

## 2015-02-22 NOTE — Telephone Encounter (Signed)
rx called in to [facility] [place], patient informed.

## 2015-02-22 NOTE — Telephone Encounter (Signed)
TC from patient stating that she has gotten her adjusted xanax prescription filled. No other needs identified per pt

## 2015-02-22 NOTE — Telephone Encounter (Signed)
Pt called asking for increase in dose of Xanax. Stated she called yesterday.

## 2015-02-25 ENCOUNTER — Ambulatory Visit: Payer: Managed Care, Other (non HMO) | Admitting: Hematology and Oncology

## 2015-02-26 DIAGNOSIS — C50412 Malignant neoplasm of upper-outer quadrant of left female breast: Secondary | ICD-10-CM | POA: Diagnosis not present

## 2015-02-27 ENCOUNTER — Ambulatory Visit: Payer: Managed Care, Other (non HMO) | Admitting: Radiation Oncology

## 2015-02-28 ENCOUNTER — Ambulatory Visit: Payer: Managed Care, Other (non HMO)

## 2015-03-01 ENCOUNTER — Ambulatory Visit: Payer: Managed Care, Other (non HMO)

## 2015-03-04 ENCOUNTER — Ambulatory Visit: Payer: Managed Care, Other (non HMO)

## 2015-03-05 ENCOUNTER — Ambulatory Visit: Payer: Managed Care, Other (non HMO)

## 2015-03-06 ENCOUNTER — Ambulatory Visit: Payer: Managed Care, Other (non HMO)

## 2015-03-07 ENCOUNTER — Ambulatory Visit: Payer: Managed Care, Other (non HMO)

## 2015-03-08 ENCOUNTER — Ambulatory Visit: Payer: Managed Care, Other (non HMO)

## 2015-03-11 ENCOUNTER — Ambulatory Visit: Payer: Managed Care, Other (non HMO)

## 2015-03-11 ENCOUNTER — Ambulatory Visit
Admission: RE | Admit: 2015-03-11 | Discharge: 2015-03-11 | Disposition: A | Discharge status: Home / Self Care | Payer: Managed Care, Other (non HMO) | Source: Ambulatory Visit | Attending: Radiation Oncology | Admitting: Radiation Oncology

## 2015-03-11 DIAGNOSIS — C50412 Malignant neoplasm of upper-outer quadrant of left female breast: Secondary | ICD-10-CM | POA: Diagnosis not present

## 2015-03-12 ENCOUNTER — Encounter: Payer: Self-pay | Admitting: Radiation Oncology

## 2015-03-12 ENCOUNTER — Ambulatory Visit
Admission: RE | Admit: 2015-03-12 | Discharge: 2015-03-12 | Disposition: A | Discharge status: Home / Self Care | Payer: Managed Care, Other (non HMO) | Source: Ambulatory Visit | Attending: Radiation Oncology | Admitting: Radiation Oncology

## 2015-03-12 ENCOUNTER — Ambulatory Visit: Payer: Managed Care, Other (non HMO)

## 2015-03-12 DIAGNOSIS — C50412 Malignant neoplasm of upper-outer quadrant of left female breast: Secondary | ICD-10-CM | POA: Diagnosis not present

## 2015-03-12 NOTE — Progress Notes (Signed)
Received work restriction paperwork from patient.  Forwarded to RN for processing.

## 2015-03-13 ENCOUNTER — Ambulatory Visit
Admission: RE | Admit: 2015-03-13 | Discharge: 2015-03-13 | Disposition: A | Discharge status: Home / Self Care | Payer: Managed Care, Other (non HMO) | Source: Ambulatory Visit | Attending: Radiation Oncology | Admitting: Radiation Oncology

## 2015-03-13 ENCOUNTER — Encounter: Payer: Self-pay | Admitting: *Deleted

## 2015-03-13 DIAGNOSIS — C50412 Malignant neoplasm of upper-outer quadrant of left female breast: Secondary | ICD-10-CM | POA: Diagnosis not present

## 2015-03-13 NOTE — Progress Notes (Signed)
Received a call back from Freddrick March from disability [phone].  He changed the submitting date of paperwork changed to 03/27/15.

## 2015-03-13 NOTE — Progress Notes (Signed)
Received disability paperwork with the date to send back of 03/10/2015.  Unfortunately Dr. Isidore Moos is out of the office until next week.  Called [phone] spoke with [facility] Case Manager working with this patients claim.  Received her voicemail which I described the situation and left my name and number [phone] for contact.  Waiting call back.  [name]

## 2015-03-14 ENCOUNTER — Ambulatory Visit
Admission: RE | Admit: 2015-03-14 | Discharge: 2015-03-14 | Disposition: A | Discharge status: Home / Self Care | Payer: Managed Care, Other (non HMO) | Source: Ambulatory Visit | Attending: Radiation Oncology | Admitting: Radiation Oncology

## 2015-03-14 DIAGNOSIS — C50412 Malignant neoplasm of upper-outer quadrant of left female breast: Secondary | ICD-10-CM | POA: Diagnosis not present

## 2015-03-15 ENCOUNTER — Ambulatory Visit
Admission: RE | Admit: 2015-03-15 | Discharge: 2015-03-15 | Disposition: A | Discharge status: Home / Self Care | Payer: Managed Care, Other (non HMO) | Source: Ambulatory Visit | Attending: Radiation Oncology | Admitting: Radiation Oncology

## 2015-03-15 DIAGNOSIS — C50412 Malignant neoplasm of upper-outer quadrant of left female breast: Secondary | ICD-10-CM | POA: Diagnosis not present

## 2015-03-18 ENCOUNTER — Ambulatory Visit
Admission: RE | Admit: 2015-03-18 | Discharge: 2015-03-18 | Disposition: A | Discharge status: Home / Self Care | Payer: Managed Care, Other (non HMO) | Source: Ambulatory Visit | Attending: Radiation Oncology | Admitting: Radiation Oncology

## 2015-03-18 ENCOUNTER — Other Ambulatory Visit: Payer: Self-pay

## 2015-03-18 VITALS — BP 162/91 | HR 68 | Temp 98.6°F | Resp 12 | Wt 213.6 lb

## 2015-03-18 DIAGNOSIS — C50412 Malignant neoplasm of upper-outer quadrant of left female breast: Secondary | ICD-10-CM | POA: Insufficient documentation

## 2015-03-18 MED ORDER — ALRA NON-METALLIC DEODORANT (RAD-ONC)
1.0000 "application " | Freq: Once | TOPICAL | Status: AC
  Administered 2015-03-18: 1 via TOPICAL

## 2015-03-18 MED ORDER — RADIAPLEXRX EX GEL
Freq: Once | CUTANEOUS | Status: AC
  Administered 2015-03-18: 18:00:00 via TOPICAL

## 2015-03-18 NOTE — Addendum Note (Signed)
Encounter addended by: Jacqulyn Liner, RN on: 03/18/2015  6:14 PM<BR>     Documentation filed: Orders, Dx Association, Medications, Inpatient [facility]

## 2015-03-18 NOTE — Progress Notes (Signed)
   Weekly Management Note:  Outpatient    ICD-9-CM ICD-10-CM   1. Breast cancer of upper-outer quadrant of left female breast 174.4 C50.412     Current Dose:  10 Gy  Projected Dose: 60 Gy   Narrative:  The patient presents for routine under treatment assessment.  CBCT/MVCT images/Port film x-rays were reviewed.  The chart was checked. Doing relatively well  Physical Findings:  weight is 213 lb 9.6 oz (96.888 kg). Her oral temperature is 98.6 F (37 C). Her blood pressure is 162/91 and her pulse is 68. Her respiration is 12 and oxygen saturation is 100%.   Wt Readings from Last 3 Encounters:  02/06/15 201 lb 11.2 oz (91.491 kg)  01/30/15 200 lb (90.719 kg)  01/23/15 198 lb (89.812 kg)   No skin changes over left breast so far  Impression:  The patient is tolerating radiotherapy.  Plan:  Continue radiotherapy as planned.    ________________________________   Eppie Gibson, M.D.

## 2015-03-18 NOTE — Progress Notes (Signed)
Pt here for patient teaching.  Pt given Radiation and You booklet, skin care instructions, Alra deodorant and Radiaplex gel. Reviewed areas of pertinence such as fatigue, hair loss, skin changes, breast tenderness and breast swelling . Pt able to give teach back of to pat skin and use unscented/gentle soap,apply Radiaplex bid, avoid applying anything to skin within 4 hours of treatment, avoid wearing an under wire bra and to use an electric razor if they must shave. Pt demonstrated understanding of information given and will contact nursing with any questions or concerns.        

## 2015-03-18 NOTE — Addendum Note (Signed)
Encounter addended by: Jenene Slicker, RN on: 03/18/2015  5:54 PM<BR>     Documentation filed: Notes Section

## 2015-03-18 NOTE — Progress Notes (Signed)
RADIATION TREATMENT  SKIN CARE-BREAST    RECOMMENDATIONS: ? Use unscented soap (Dove) ? When showering it is fine for water to touch the area, but please avoid direct spray on the treatment field if skin becomes irritated.  Also, wash inside and around the marked area ? When drying gently blot the area  ? PLEASE DO NOT APPLY ANY OTHER CREAMS, LOTIONS, POWDERS, PERFUMES, OILS OR ALCOHOL PRODUCTS (OTHER THAN WHAT IS GIVEN TO YOU BY THE RADIATION TEAM) TO THE TREATMENT AREA DURING RADIATION THERAPY   SKIN CARE: ? Moisturizer o You will be given (Radiaplex Gel) to use. Apply twice daily, once after treatment and then again prior to bedtime o Your Radiation Oncologist may suggest other skin care products as needed  DEODORANT:  ? Nursing will provide deodorant (ALRA) to be used during your radiation treatment  Deodorant Alternatives: ? A combination of equal amounts of baking soda and corn starch.  Mix together and powder puff on ? Toms of [place]-available at most [employer] ([employer], [employer]) ? Thai Crystal Deodorant Mist-100% Natural Mineral Salts and Purified water ([employer], [employer])   PLEASE DO NOT APPLY THE RADIAPLEX GEL OR ALRA DEODORANT WITHIN 4 HOURS PRIOR TO RADIATION TREATMENT  

## 2015-03-18 NOTE — Progress Notes (Signed)
She is currently in no pain.  Pt complains of fatigue and loss of sleep, reports having insomnia at times, and overeating.  Pt left breast- positive for crusting around nipple area.  Pt denies edema. Pt to received teaching today. BP 162/91 mmHg  Pulse 68  Temp(Src) 98.6 F (37 C) (Oral)  Resp 12  Wt 213 lb 9.6 oz (96.888 kg)  SpO2 100%  LMP 02/15/2013

## 2015-03-19 ENCOUNTER — Telehealth: Payer: Self-pay | Admitting: Hematology and Oncology

## 2015-03-19 ENCOUNTER — Ambulatory Visit
Admission: RE | Admit: 2015-03-19 | Discharge: 2015-03-19 | Disposition: A | Discharge status: Home / Self Care | Payer: Managed Care, Other (non HMO) | Source: Ambulatory Visit | Attending: Radiation Oncology | Admitting: Radiation Oncology

## 2015-03-19 DIAGNOSIS — C50412 Malignant neoplasm of upper-outer quadrant of left female breast: Secondary | ICD-10-CM | POA: Diagnosis not present

## 2015-03-19 NOTE — Telephone Encounter (Signed)
Called patient and left a message with her new appointment time per pof

## 2015-03-20 ENCOUNTER — Ambulatory Visit
Admission: RE | Admit: 2015-03-20 | Discharge: 2015-03-20 | Disposition: A | Discharge status: Home / Self Care | Payer: Managed Care, Other (non HMO) | Source: Ambulatory Visit | Attending: Radiation Oncology | Admitting: Radiation Oncology

## 2015-03-20 DIAGNOSIS — C50412 Malignant neoplasm of upper-outer quadrant of left female breast: Secondary | ICD-10-CM | POA: Diagnosis not present

## 2015-03-21 ENCOUNTER — Ambulatory Visit
Admission: RE | Admit: 2015-03-21 | Discharge: 2015-03-21 | Disposition: A | Discharge status: Home / Self Care | Payer: Managed Care, Other (non HMO) | Source: Ambulatory Visit | Attending: Radiation Oncology | Admitting: Radiation Oncology

## 2015-03-21 ENCOUNTER — Telehealth: Payer: Self-pay | Admitting: *Deleted

## 2015-03-21 DIAGNOSIS — C50412 Malignant neoplasm of upper-outer quadrant of left female breast: Secondary | ICD-10-CM | POA: Diagnosis not present

## 2015-03-21 NOTE — Telephone Encounter (Signed)
Left vm for pt to return call to assess needs during xrt. Contact information given.

## 2015-03-22 ENCOUNTER — Ambulatory Visit
Admission: RE | Admit: 2015-03-22 | Discharge: 2015-03-22 | Disposition: A | Discharge status: Home / Self Care | Payer: Managed Care, Other (non HMO) | Source: Ambulatory Visit | Attending: Radiation Oncology | Admitting: Radiation Oncology

## 2015-03-22 ENCOUNTER — Encounter: Payer: Self-pay | Admitting: Radiation Oncology

## 2015-03-22 VITALS — BP 145/74 | HR 64 | Temp 97.9°F | Resp 12 | Ht 65.0 in | Wt 220.4 lb

## 2015-03-22 DIAGNOSIS — C50412 Malignant neoplasm of upper-outer quadrant of left female breast: Secondary | ICD-10-CM | POA: Diagnosis not present

## 2015-03-22 NOTE — Progress Notes (Signed)
   Weekly Management Note:  Outpatient    ICD-9-CM ICD-10-CM   1. Breast cancer of upper-outer quadrant of left female breast 174.4 C50.412     Current Dose:  18 Gy  Projected Dose: 60 Gy   Narrative:  The patient presents for routine under treatment assessment.  CBCT/MVCT images/Port film x-rays were reviewed.  The chart was checked. Doing relatively well  Physical Findings:  height is 5\' 5"  (1.651 m) and weight is 220 lb 6.4 oz (99.973 kg). Her oral temperature is 97.9 F (36.6 C). Her blood pressure is 145/74 and her pulse is 64. Her respiration is 12.   Wt Readings from Last 3 Encounters:  02/06/15 201 lb 11.2 oz (91.491 kg)  01/30/15 200 lb (90.719 kg)  01/23/15 198 lb (89.812 kg)   Mild hyperpigmentation  over left breast so far  Impression:  The patient is tolerating radiotherapy.  Plan:  Continue radiotherapy as planned.    ________________________________   Eppie Gibson, M.D.

## 2015-03-22 NOTE — Progress Notes (Signed)
Kimberly Reed has completed 9 fractions to her left breast.  She denies pain.  She reports fatigue.  The skin on her left breast is intact.  She is using radiaplex gel.  BP 145/74 mmHg  Pulse 64  Temp(Src) 97.9 F (36.6 C) (Oral)  Resp 12  Ht 5\' 5"  (1.651 m)  Wt 220 lb 6.4 oz (99.973 kg)  BMI 36.68 kg/m2  LMP 02/15/2013

## 2015-03-26 ENCOUNTER — Ambulatory Visit
Admission: RE | Admit: 2015-03-26 | Discharge: 2015-03-26 | Disposition: A | Discharge status: Home / Self Care | Payer: Managed Care, Other (non HMO) | Source: Ambulatory Visit | Attending: Radiation Oncology | Admitting: Radiation Oncology

## 2015-03-26 DIAGNOSIS — C50412 Malignant neoplasm of upper-outer quadrant of left female breast: Secondary | ICD-10-CM | POA: Diagnosis not present

## 2015-03-27 ENCOUNTER — Encounter: Payer: Self-pay | Admitting: Radiation Oncology

## 2015-03-27 ENCOUNTER — Ambulatory Visit
Admission: RE | Admit: 2015-03-27 | Discharge: 2015-03-27 | Disposition: A | Discharge status: Home / Self Care | Payer: Managed Care, Other (non HMO) | Source: Ambulatory Visit | Attending: Radiation Oncology | Admitting: Radiation Oncology

## 2015-03-27 DIAGNOSIS — C50412 Malignant neoplasm of upper-outer quadrant of left female breast: Secondary | ICD-10-CM | POA: Diagnosis not present

## 2015-03-27 NOTE — Progress Notes (Signed)
Disability forms received back completed, I gave completed forms to the patient.  Forms scanned.

## 2015-03-28 ENCOUNTER — Ambulatory Visit
Admission: RE | Admit: 2015-03-28 | Discharge: 2015-03-28 | Disposition: A | Discharge status: Home / Self Care | Payer: Managed Care, Other (non HMO) | Source: Ambulatory Visit | Attending: Radiation Oncology | Admitting: Radiation Oncology

## 2015-03-28 DIAGNOSIS — C50412 Malignant neoplasm of upper-outer quadrant of left female breast: Secondary | ICD-10-CM | POA: Diagnosis not present

## 2015-03-29 ENCOUNTER — Ambulatory Visit
Admission: RE | Admit: 2015-03-29 | Discharge: 2015-03-29 | Disposition: A | Discharge status: Home / Self Care | Payer: Managed Care, Other (non HMO) | Source: Ambulatory Visit | Attending: Radiation Oncology | Admitting: Radiation Oncology

## 2015-03-29 DIAGNOSIS — C50412 Malignant neoplasm of upper-outer quadrant of left female breast: Secondary | ICD-10-CM | POA: Diagnosis not present

## 2015-03-30 ENCOUNTER — Ambulatory Visit: Payer: Managed Care, Other (non HMO)

## 2015-04-01 ENCOUNTER — Encounter: Payer: Self-pay | Admitting: Radiation Oncology

## 2015-04-01 ENCOUNTER — Ambulatory Visit
Admission: RE | Admit: 2015-04-01 | Discharge: 2015-04-01 | Disposition: A | Discharge status: Home / Self Care | Payer: Managed Care, Other (non HMO) | Source: Ambulatory Visit | Attending: Radiation Oncology | Admitting: Radiation Oncology

## 2015-04-01 VITALS — BP 139/70 | HR 64 | Temp 98.0°F | Ht 65.0 in | Wt 219.0 lb

## 2015-04-01 DIAGNOSIS — C50412 Malignant neoplasm of upper-outer quadrant of left female breast: Secondary | ICD-10-CM

## 2015-04-01 NOTE — Progress Notes (Signed)
   Weekly Management Note:  Outpatient    ICD-9-CM ICD-10-CM   1. Breast cancer of upper-outer quadrant of left female breast 174.4 C50.412     Current Dose:  28 Gy  Projected Dose: 60 Gy   Narrative:  The patient presents for routine under treatment assessment.  CBCT/MVCT images/Port film x-rays were reviewed.  The chart was checked. Asks if RT can be shortened with less treatments   Physical Findings:  height is 5\' 5"  (1.651 m) and weight is 219 lb (99.338 kg). Her temperature is 98 F (36.7 C). Her blood pressure is 139/70 and her pulse is 64.   Wt Readings from Last 3 Encounters:  04/01/15 219 lb (99.338 kg)  02/06/15 201 lb 11.2 oz (91.491 kg)  01/30/15 200 lb (90.719 kg)   Mild hyperpigmentation  over left breast so far  Impression:  The patient is tolerating radiotherapy.  Plan:  Continue radiotherapy as planned.  Discussed importance of treatment over a planned 6 weeks.   ________________________________   Eppie Gibson, M.D.

## 2015-04-01 NOTE — Progress Notes (Addendum)
Kimberly Reed has received 14 fractions to her left breast.  Note mild erythema with intact, soft skin.  She reports intermittent burning sensation which is calmed down with Radiaplex Gel.

## 2015-04-02 ENCOUNTER — Ambulatory Visit
Admission: RE | Admit: 2015-04-02 | Discharge: 2015-04-02 | Disposition: A | Discharge status: Home / Self Care | Payer: Managed Care, Other (non HMO) | Source: Ambulatory Visit | Attending: Radiation Oncology | Admitting: Radiation Oncology

## 2015-04-02 DIAGNOSIS — C50412 Malignant neoplasm of upper-outer quadrant of left female breast: Secondary | ICD-10-CM | POA: Diagnosis not present

## 2015-04-03 ENCOUNTER — Encounter: Payer: Self-pay | Admitting: Radiation Oncology

## 2015-04-03 ENCOUNTER — Ambulatory Visit
Admission: RE | Admit: 2015-04-03 | Discharge: 2015-04-03 | Disposition: A | Discharge status: Home / Self Care | Payer: Managed Care, Other (non HMO) | Source: Ambulatory Visit | Attending: Radiation Oncology | Admitting: Radiation Oncology

## 2015-04-03 ENCOUNTER — Other Ambulatory Visit: Payer: Self-pay | Admitting: *Deleted

## 2015-04-03 DIAGNOSIS — C50412 Malignant neoplasm of upper-outer quadrant of left female breast: Secondary | ICD-10-CM | POA: Diagnosis not present

## 2015-04-04 ENCOUNTER — Ambulatory Visit
Admission: RE | Admit: 2015-04-04 | Discharge: 2015-04-04 | Disposition: A | Discharge status: Home / Self Care | Payer: Managed Care, Other (non HMO) | Source: Ambulatory Visit | Attending: Radiation Oncology | Admitting: Radiation Oncology

## 2015-04-04 DIAGNOSIS — C50412 Malignant neoplasm of upper-outer quadrant of left female breast: Secondary | ICD-10-CM | POA: Diagnosis not present

## 2015-04-05 ENCOUNTER — Ambulatory Visit
Admission: RE | Admit: 2015-04-05 | Discharge: 2015-04-05 | Disposition: A | Discharge status: Home / Self Care | Payer: Managed Care, Other (non HMO) | Source: Ambulatory Visit | Attending: Radiation Oncology | Admitting: Radiation Oncology

## 2015-04-05 DIAGNOSIS — C50412 Malignant neoplasm of upper-outer quadrant of left female breast: Secondary | ICD-10-CM | POA: Diagnosis not present

## 2015-04-08 ENCOUNTER — Encounter: Payer: Self-pay | Admitting: Radiation Oncology

## 2015-04-08 ENCOUNTER — Ambulatory Visit
Admission: RE | Admit: 2015-04-08 | Discharge: 2015-04-08 | Disposition: A | Discharge status: Home / Self Care | Payer: Managed Care, Other (non HMO) | Source: Ambulatory Visit | Attending: Radiation Oncology | Admitting: Radiation Oncology

## 2015-04-08 VITALS — BP 130/72 | HR 69 | Temp 98.6°F | Resp 10 | Wt 222.2 lb

## 2015-04-08 DIAGNOSIS — C50412 Malignant neoplasm of upper-outer quadrant of left female breast: Secondary | ICD-10-CM | POA: Diagnosis not present

## 2015-04-08 NOTE — Progress Notes (Signed)
PAIN: She is currently in no pain.   SKIN: Pt left breast- positive for erythema and breast tenderness.  Pt reports some slight left arm swelling.   Pt continues to apply Radiaplex as directed. OTHER: Pt complains of fatigue. BP 130/72 mmHg  Pulse 69  Temp(Src) 98.6 F (37 C) (Oral)  Resp 10  Wt 222 lb 3.2 oz (100.789 kg)  SpO2 99%  LMP 02/15/2013

## 2015-04-08 NOTE — Progress Notes (Signed)
   Weekly Management Note:  Outpatient    ICD-9-CM ICD-10-CM   1. Breast cancer of upper-outer quadrant of left female breast 174.4 C50.412    Current Dose:  38 Gy  Projected Dose: 60 Gy   Narrative:  The patient presents for routine under treatment assessment.  CBCT/MVCT images/Port film x-rays were reviewed.  The chart was checked.  PAIN:  She is currently in no pain.  SKIN:  Pt left breast- positive for erythema and breast tenderness. Pt reports some slight left arm swelling.  Pt continues to apply Radiaplex as directed.  OTHER:  Pt complains of fatigue.   Physical Findings:  weight is 222 lb 3.2 oz (100.789 kg). Her oral temperature is 98.6 F (37 C). Her blood pressure is 130/72 and her pulse is 69. Her respiration is 10 and oxygen saturation is 99%.   Wt Readings from Last 3 Encounters:  04/08/15 222 lb 3.2 oz (100.789 kg)  04/01/15 219 lb (99.338 kg)  03/22/15 220 lb 6.4 oz (99.973 kg)  Some mild hyperpigmentation over the left breast.  Flat affect.  Impression:  The patient is tolerating radiotherapy.  Plan:  Continue radiotherapy as planned.    This document serves as a record of services personally performed by Eppie Gibson, MD. It was created on her behalf by Arlyce Harman, a trained medical scribe. The creation of this record is based on the scribe's personal observations and the provider's statements to them. This document has been checked and approved by the attending provider. ________________________________   Eppie Gibson, M.D.

## 2015-04-09 ENCOUNTER — Ambulatory Visit
Admission: RE | Admit: 2015-04-09 | Discharge: 2015-04-09 | Disposition: A | Discharge status: Home / Self Care | Payer: Managed Care, Other (non HMO) | Source: Ambulatory Visit | Attending: Radiation Oncology | Admitting: Radiation Oncology

## 2015-04-09 DIAGNOSIS — C50412 Malignant neoplasm of upper-outer quadrant of left female breast: Secondary | ICD-10-CM | POA: Diagnosis not present

## 2015-04-10 ENCOUNTER — Ambulatory Visit
Admission: RE | Admit: 2015-04-10 | Discharge: 2015-04-10 | Disposition: A | Discharge status: Home / Self Care | Payer: Managed Care, Other (non HMO) | Source: Ambulatory Visit | Attending: Radiation Oncology | Admitting: Radiation Oncology

## 2015-04-10 DIAGNOSIS — C50412 Malignant neoplasm of upper-outer quadrant of left female breast: Secondary | ICD-10-CM | POA: Diagnosis not present

## 2015-04-11 ENCOUNTER — Ambulatory Visit: Payer: Managed Care, Other (non HMO)

## 2015-04-11 ENCOUNTER — Ambulatory Visit
Admission: RE | Admit: 2015-04-11 | Discharge: 2015-04-11 | Disposition: A | Discharge status: Home / Self Care | Payer: Managed Care, Other (non HMO) | Source: Ambulatory Visit | Attending: Radiation Oncology | Admitting: Radiation Oncology

## 2015-04-11 DIAGNOSIS — C50412 Malignant neoplasm of upper-outer quadrant of left female breast: Secondary | ICD-10-CM | POA: Diagnosis not present

## 2015-04-12 ENCOUNTER — Ambulatory Visit
Admission: RE | Admit: 2015-04-12 | Discharge: 2015-04-12 | Disposition: A | Discharge status: Home / Self Care | Payer: Managed Care, Other (non HMO) | Source: Ambulatory Visit | Attending: Radiation Oncology | Admitting: Radiation Oncology

## 2015-04-12 DIAGNOSIS — C50412 Malignant neoplasm of upper-outer quadrant of left female breast: Secondary | ICD-10-CM | POA: Diagnosis not present

## 2015-04-13 ENCOUNTER — Ambulatory Visit: Payer: Managed Care, Other (non HMO)

## 2015-04-15 ENCOUNTER — Ambulatory Visit
Admission: RE | Admit: 2015-04-15 | Discharge: 2015-04-15 | Disposition: A | Discharge status: Home / Self Care | Payer: Managed Care, Other (non HMO) | Source: Ambulatory Visit | Attending: Radiation Oncology | Admitting: Radiation Oncology

## 2015-04-15 VITALS — BP 146/90 | HR 70 | Temp 98.6°F | Resp 12 | Wt 224.2 lb

## 2015-04-15 DIAGNOSIS — C50412 Malignant neoplasm of upper-outer quadrant of left female breast: Secondary | ICD-10-CM | POA: Diagnosis not present

## 2015-04-15 DIAGNOSIS — C50912 Malignant neoplasm of unspecified site of left female breast: Secondary | ICD-10-CM | POA: Diagnosis present

## 2015-04-15 MED ORDER — ALRA NON-METALLIC DEODORANT (RAD-ONC)
1.0000 "application " | Freq: Once | TOPICAL | Status: AC
  Administered 2015-04-15: 1 via TOPICAL

## 2015-04-15 MED ORDER — RADIAPLEXRX EX GEL
Freq: Once | CUTANEOUS | Status: AC
  Administered 2015-04-15: 18:00:00 via TOPICAL

## 2015-04-15 NOTE — Progress Notes (Signed)
PAIN: She is currently in no pain.  SKIN: Pt left breast- positive for Hyperpigmentation, erythema and breast tenderness. Noted redness and hyperpigmentation along left clavicle.  Pt reports edema (slight) to left arm.  Pt continues to apply Radiaplex as directed. OTHER: Pt complains of fatigue and loss of sleep. BP 146/90 mmHg  Pulse 70  Temp(Src) 98.6 F (37 C) (Oral)  Resp 12  Wt 224 lb 3.2 oz (101.696 kg)  SpO2 100%  LMP 02/15/2013

## 2015-04-15 NOTE — Progress Notes (Signed)
Weekly Management Note:  Site: Left breast Current Dose:  4800  cGy Projected Dose: 5000  cGy followed by 5 fraction boost  Narrative: The patient is seen today for routine under treatment assessment. CBCT/MVCT images/port films were reviewed. The chart was reviewed.   She is without new complaints today.  She uses Radioplex gel.  She does have fatigue and insomnia.  Physical Examination:  Filed Vitals:   04/15/15 1702  BP: 146/90  Pulse: 70  Temp: 98.6 F (37 C)  Resp: 12  .  Weight: 224 lb 3.2 oz (101.696 kg).  There is moderate erythema of the left breast and also along the clavicular/axillary region.  There is dry desquamation but no areas of moist desquamation.  There may be trace left upper extremity lymphedema.  Impression: Tolerating radiation therapy well.  Plan: Continue radiation therapy as planned.

## 2015-04-16 ENCOUNTER — Ambulatory Visit: Payer: Managed Care, Other (non HMO)

## 2015-04-16 ENCOUNTER — Other Ambulatory Visit: Payer: Managed Care, Other (non HMO)

## 2015-04-16 ENCOUNTER — Ambulatory Visit
Admission: RE | Admit: 2015-04-16 | Discharge: 2015-04-16 | Disposition: A | Discharge status: Home / Self Care | Payer: Managed Care, Other (non HMO) | Source: Ambulatory Visit | Attending: Radiation Oncology | Admitting: Radiation Oncology

## 2015-04-16 ENCOUNTER — Ambulatory Visit: Payer: Managed Care, Other (non HMO) | Admitting: Hematology and Oncology

## 2015-04-16 DIAGNOSIS — C50412 Malignant neoplasm of upper-outer quadrant of left female breast: Secondary | ICD-10-CM | POA: Diagnosis not present

## 2015-04-17 ENCOUNTER — Ambulatory Visit
Admission: RE | Admit: 2015-04-17 | Discharge: 2015-04-17 | Disposition: A | Discharge status: Home / Self Care | Payer: Managed Care, Other (non HMO) | Source: Ambulatory Visit | Attending: Radiation Oncology | Admitting: Radiation Oncology

## 2015-04-17 DIAGNOSIS — C50412 Malignant neoplasm of upper-outer quadrant of left female breast: Secondary | ICD-10-CM | POA: Diagnosis not present

## 2015-04-18 ENCOUNTER — Ambulatory Visit
Admission: RE | Admit: 2015-04-18 | Discharge: 2015-04-18 | Disposition: A | Discharge status: Home / Self Care | Payer: Managed Care, Other (non HMO) | Source: Ambulatory Visit | Attending: Radiation Oncology | Admitting: Radiation Oncology

## 2015-04-18 DIAGNOSIS — C50412 Malignant neoplasm of upper-outer quadrant of left female breast: Secondary | ICD-10-CM | POA: Diagnosis not present

## 2015-04-19 ENCOUNTER — Ambulatory Visit
Admission: RE | Admit: 2015-04-19 | Discharge: 2015-04-19 | Disposition: A | Discharge status: Home / Self Care | Payer: Managed Care, Other (non HMO) | Source: Ambulatory Visit | Attending: Radiation Oncology | Admitting: Radiation Oncology

## 2015-04-19 ENCOUNTER — Ambulatory Visit: Payer: Managed Care, Other (non HMO)

## 2015-04-19 DIAGNOSIS — C50412 Malignant neoplasm of upper-outer quadrant of left female breast: Secondary | ICD-10-CM | POA: Diagnosis not present

## 2015-04-22 ENCOUNTER — Ambulatory Visit: Payer: Managed Care, Other (non HMO)

## 2015-04-22 ENCOUNTER — Ambulatory Visit
Admission: RE | Admit: 2015-04-22 | Payer: Managed Care, Other (non HMO) | Source: Ambulatory Visit | Admitting: Radiation Oncology

## 2015-04-23 ENCOUNTER — Other Ambulatory Visit: Payer: Self-pay

## 2015-04-23 ENCOUNTER — Telehealth: Payer: Self-pay | Admitting: Hematology and Oncology

## 2015-04-23 ENCOUNTER — Ambulatory Visit: Payer: Managed Care, Other (non HMO)

## 2015-04-23 DIAGNOSIS — C50412 Malignant neoplasm of upper-outer quadrant of left female breast: Secondary | ICD-10-CM | POA: Diagnosis not present

## 2015-04-23 NOTE — Telephone Encounter (Signed)
Called and left a message with a follow up appointment

## 2015-04-24 ENCOUNTER — Ambulatory Visit: Payer: Managed Care, Other (non HMO)

## 2015-04-24 ENCOUNTER — Encounter: Payer: Self-pay | Admitting: Radiation Oncology

## 2015-04-24 ENCOUNTER — Ambulatory Visit
Admission: RE | Admit: 2015-04-24 | Discharge: 2015-04-24 | Disposition: A | Discharge status: Home / Self Care | Payer: Managed Care, Other (non HMO) | Source: Ambulatory Visit | Attending: Radiation Oncology | Admitting: Radiation Oncology

## 2015-04-24 ENCOUNTER — Ambulatory Visit: Payer: Managed Care, Other (non HMO) | Admitting: Hematology and Oncology

## 2015-04-24 ENCOUNTER — Other Ambulatory Visit: Payer: Managed Care, Other (non HMO)

## 2015-04-24 VITALS — BP 145/71 | HR 74 | Temp 98.5°F | Resp 12 | Wt 229.5 lb

## 2015-04-24 DIAGNOSIS — C50412 Malignant neoplasm of upper-outer quadrant of left female breast: Secondary | ICD-10-CM | POA: Diagnosis not present

## 2015-04-24 NOTE — Progress Notes (Signed)
PAIN: She rates her pain as a 5 on a scale of 0-10. intermittent and dull over headache. SKIN: Pt left breast- positive for Hyperpigmentation, Pruritus, erythema and dry desquamation.  Pt denies edema.  Pt continues to apply Radiaplex as directed. OTHER: Pt complains of fatigue and loss of sleep. Reports chest pain "heavyness" left and central chest when laying down.  Started this weekend.   BP 145/71 mmHg  Pulse 74  Temp(Src) 98.5 F (36.9 C) (Oral)  Resp 12  Wt 229 lb 8 oz (104.101 kg)  SpO2 100%  LMP 02/15/2013 Wt Readings from Last 3 Encounters:  04/24/15 229 lb 8 oz (104.101 kg)  04/15/15 224 lb 3.2 oz (101.696 kg)  04/08/15 222 lb 3.2 oz (100.789 kg)

## 2015-04-24 NOTE — Progress Notes (Signed)
   Weekly Management Note:  Outpatient   Current Dose: 60 Gy  Projected Dose: 60 Gy   Narrative:  The patient presents for routine under treatment assessment.  CBCT/MVCT images/Port film x-rays were reviewed.  The chart was checked.  She reports skin irritation, left breast pain/heavyness.    Physical Findings:  weight is 229 lb 8 oz (104.101 kg). Her oral temperature is 98.5 F (36.9 C). Her blood pressure is 145/71 and her pulse is 74. Her respiration is 12 and oxygen saturation is 100%.   Wt Readings from Last 3 Encounters:  04/24/15 229 lb 8 oz (104.101 kg)  04/15/15 224 lb 3.2 oz (101.696 kg)  04/08/15 222 lb 3.2 oz (100.789 kg)  Diffuse dryness and hyperpigmentation over the left breast and low neck.  Skin intact. Flat affect.  Impression:  The patient is tolerating radiotherapy.  Plan: Advised to apply lotion to breast and low neck and upper back for more healing. f/u in 72mo.  This document serves as a record of services personally performed by Eppie Gibson, MD. It was created on her behalf by Arlyce Harman, a trained medical scribe. The creation of this record is based on the scribe's personal observations and the provider's statements to them. This document has been checked and approved by the attending provider. ________________________________   Eppie Gibson, M.D.

## 2015-04-25 ENCOUNTER — Telehealth: Payer: Self-pay | Admitting: *Deleted

## 2015-04-25 NOTE — Telephone Encounter (Signed)
CALLED PATIENT TO INFORM OF FU WITH DR. Isidore Moos ON 05-31-15 @ 4 PM, LVM FOR A RETURN CALL

## 2015-04-28 NOTE — Progress Notes (Signed)
  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      Plan: The patient has completed radiation treatment. The patient will return to radiation oncology clinic for routine followup in one month. I advised them to call or return sooner if they have any questions or concerns related to their recovery or treatment.  -----------------------------------  Eppie Gibson, MD

## 2015-05-01 ENCOUNTER — Telehealth: Payer: Self-pay | Admitting: Hematology and Oncology

## 2015-05-01 NOTE — Telephone Encounter (Signed)
Patient called i and left a message to cancel dr Lindi Adie appointment due to her work schedule and will call back to reschedule

## 2015-05-09 ENCOUNTER — Other Ambulatory Visit: Payer: Self-pay

## 2015-05-09 ENCOUNTER — Telehealth: Payer: Self-pay | Admitting: Hematology and Oncology

## 2015-05-09 NOTE — Telephone Encounter (Signed)
lvm fo rpt regarding to JULY appt... °

## 2015-05-10 ENCOUNTER — Telehealth: Payer: Self-pay | Admitting: Hematology and Oncology

## 2015-05-10 NOTE — Telephone Encounter (Signed)
Returned Advertising account executive. Unable to reach patient call went straight to voicemail. Left message to call back to reschedule appointment per patient request on 07/20. Asked patient to give a alternate number to be reached at due to going straight to voicemail.

## 2015-05-14 ENCOUNTER — Ambulatory Visit: Payer: Managed Care, Other (non HMO) | Admitting: Hematology and Oncology

## 2015-05-16 ENCOUNTER — Encounter: Payer: Self-pay | Admitting: *Deleted

## 2015-05-16 ENCOUNTER — Ambulatory Visit (HOSPITAL_BASED_OUTPATIENT_CLINIC_OR_DEPARTMENT_OTHER): Payer: Managed Care, Other (non HMO) | Admitting: Hematology and Oncology

## 2015-05-16 ENCOUNTER — Encounter: Payer: Self-pay | Admitting: Hematology and Oncology

## 2015-05-16 VITALS — BP 123/63 | HR 68 | Temp 98.4°F | Resp 18 | Ht 65.0 in | Wt 232.1 lb

## 2015-05-16 DIAGNOSIS — C773 Secondary and unspecified malignant neoplasm of axilla and upper limb lymph nodes: Secondary | ICD-10-CM | POA: Diagnosis not present

## 2015-05-16 DIAGNOSIS — C50412 Malignant neoplasm of upper-outer quadrant of left female breast: Secondary | ICD-10-CM | POA: Diagnosis not present

## 2015-05-16 DIAGNOSIS — C7951 Secondary malignant neoplasm of bone: Secondary | ICD-10-CM | POA: Diagnosis not present

## 2015-05-16 MED ORDER — ANASTROZOLE 1 MG PO TABS: 1.0000 mg | ORAL_TABLET | Freq: Every day | ORAL | Status: DC

## 2015-05-16 NOTE — Progress Notes (Signed)
Patient Care Team: Burnis Medin, MD as PCP - General (Internal Medicine) Nicholas Lose, MD as Consulting Physician (Hematology and Oncology) [name], MD as Consulting Physician (General Surgery) Eppie Gibson, MD as Attending Physician (Radiation Oncology)  DIAGNOSIS: Breast cancer of upper-outer quadrant of left female breast   Staging form: Breast, AJCC 7th Edition     Clinical: Stage IV (T2, N1, M1) - Signed by Rulon Eisenmenger, MD on 08/16/2014       Staging comments: Staged at breast conference 06/06/14.      Pathologic stage from 02/01/2015: Stage IV (yT1c, N3, M1) - Signed by Enid Cutter, MD on 03/07/2015       Staging comments: Staged on final lumpectomy specimen by Dr. Donato Heinz.    SUMMARY OF ONCOLOGIC HISTORY:   Breast cancer of upper-outer quadrant of left female breast   05/18/2014 Mammogram Suspicious left upper outer quadrant 5 cm segmental area of calcifications.    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   05/30/2014 Breast MRI Left Breast: 12oclock: 21 x 23 x 30 mm, Numerous  level 1 and 2 LN; Liver lesions cycts by Liver MRI   06/07/2014 Initial Biopsy Left axillary lymph node biopsy invasive ductal carcinoma ER 100%, PR 11% Ki-67 15% HER-2 negative ratio 1.41   06/13/2014 PET scan left breast activity, hypermetabolic left axillary and left retropectoral lymph node, small lucent lesion in L1 vertebra which was biopsied and proven to be bone metastases   07/19/2014 Initial Biopsy Biopsy of L1 vertebra metastatic carcinoma breast primary, ER 100%, PR 4%, HER-2 negative ratio 1.3   07/26/2014 - 12/18/2014 Neo-Adjuvant Chemotherapy Even though she has L1 solitary bone metastases, patient is being treated definitively with neoadjuvant chemotherapy with dose dense Adriamycin and Cytoxan to be followed by weekly Taxol x12   12/25/2014 PET scan Interval improvement in size and metabolic activity  associated with left axillary lymph nodes consistent with treatment response, no hypermetabolic distant metastases, L1 vertebral body has no activity   12/25/2014 Breast MRI No residual enhancement left breast, decreased left axillary lymphadenopathy, indicating response to treatment but lymph nodes are still present   01/30/2015 Surgery Left breast lumpectomy: Invasive ductal carcinoma grade 2, 1.2 cm, 12/15 lymph nodes positive, extracapsular tumor extension present, T1c N3M1 stage IV    03/12/2015 - 04/24/2015 Radiation Therapy  adjuvant radiation therapy with Dr. Isidore Moos   05/16/2015 -  Anti-estrogen oral therapy Anastrozole 1 mg daily 10 years is the plan    CHIEF COMPLIANT: Follow-up after radiation therapy   INTERVAL HISTORY: Kimberly Reed is a 59 year old with above-mentioned history of left breast cancer treated with neo-adjuvant chemotherapy followed by lumpectomy and radiation and is currently here to discuss the role of antiestrogen therapy. She reports that she had done fairly well from radiation although she still feels tired from the effects of it. She is back to working full-time and is very stressed at work. She does not have any new complaints or concerns today.  REVIEW OF SYSTEMS:   Constitutional: Denies fevers, chills or abnormal weight loss Eyes: Denies blurriness of vision Ears, nose, mouth, throat, and face: Denies mucositis or sore throat Respiratory: Denies cough, dyspnea or wheezes Cardiovascular: Denies palpitation, chest discomfort or lower extremity swelling Gastrointestinal:  Denies nausea, heartburn or change in bowel habits Skin: Denies abnormal skin rashes Lymphatics: Denies new lymphadenopathy or easy bruising Neurological:Denies numbness, tingling or new weaknesses Behavioral/Psych: Patient has underlying depression Breast:  denies any pain or lumps or nodules in either breasts All other systems were reviewed with the patient and are negative.  I have reviewed  the past medical history, past surgical history, social history and family history with the patient and they are unchanged from previous note.  ALLERGIES:  is allergic to other and effexor.  MEDICATIONS:  Current Outpatient Prescriptions  Medication Sig Dispense Refill  . ALPRAZolam (XANAX) 1 MG tablet Take 1 tablet (1 mg total) by mouth 3 (three) times daily as needed for anxiety. 60 tablet 1  . ARIPiprazole (ABILIFY) 10 MG tablet Take 0.5 tablets (5 mg total) by mouth every morning. 90 tablet 0  . brimonidine-timolol (COMBIGAN) 0.2-0.5 % ophthalmic solution Place 1 drop into the right eye every 12 (twelve) hours.     . dorzolamide (TRUSOPT) 2 % ophthalmic solution Place 1 drop into the right eye 2 (two) times daily.     . hyaluronate sodium (RADIAPLEXRX) GEL Apply 1 application topically once.    [name] levothyroxine (SYNTHROID) 100 MCG tablet Take 1 tablet (100 mcg total) by mouth daily before breakfast. 90 tablet 3  . non-metallic deodorant (ALRA) MISC Apply 1 application topically daily as needed.    . ondansetron (ZOFRAN-ODT) 4 MG disintegrating tablet     . OVER THE COUNTER MEDICATION Take 1 capsule by mouth daily.    [name] oxyCODONE-acetaminophen (ROXICET) 5-325 MG per tablet Take 1-2 tablets by mouth every 4 (four) hours as needed for severe pain. 40 tablet 0  . Travoprost, BAK Free, (TRAVATAN) 0.004 % SOLN ophthalmic solution Place 1 drop into the right eye at bedtime.     [name] anastrozole (ARIMIDEX) 1 MG tablet Take 1 tablet (1 mg total) by mouth daily. 90 tablet 3   No current facility-administered medications for this visit.   Facility-Administered Medications Ordered in Other Visits  Medication Dose Route Frequency Provider Last Rate Last Dose  . sodium chloride 0.9 % injection 10 mL  10 mL Intracatheter PRN Nicholas Lose, MD   10 mL at 10/12/14 1359  . sodium chloride 0.9 % injection 10 mL  10 mL Intracatheter PRN Nicholas Lose, MD   10 mL at 12/11/14 1653    PHYSICAL EXAMINATION: ECOG  PERFORMANCE STATUS: 1 - Symptomatic but completely ambulatory  Filed Vitals:   05/16/15 1419  BP: 123/63  Pulse: 68  Temp: 98.4 F (36.9 C)  Resp: 18   Filed Weights   05/16/15 1419  Weight: 232 lb 1.6 oz (105.28 kg)    GENERAL:alert, no distress and comfortable SKIN: skin color, texture, turgor are normal, no rashes or significant lesions EYES: normal, Conjunctiva are pink and non-injected, sclera clear OROPHARYNX:no exudate, no erythema and lips, buccal mucosa, and tongue normal  NECK: supple, thyroid normal size, non-tender, without nodularity LYMPH:  no palpable lymphadenopathy in the cervical, axillary or inguinal LUNGS: clear to auscultation and percussion with normal breathing effort HEART: regular rate & rhythm and no murmurs and no lower extremity edema ABDOMEN:abdomen soft, non-tender and normal bowel sounds Musculoskeletal:no cyanosis of digits and no clubbing  NEURO: alert & oriented x 3 with fluent speech, no focal motor/sensory deficits  LABORATORY DATA:  I have reviewed the data as listed   Chemistry      Component Value Date/Time   NA 140 01/16/2015 1000   NA 139 12/18/2014 1046   K 4.9 01/16/2015 1000   K 4.0 12/18/2014 1046   CL 104 01/16/2015 1000   CO2 30 01/16/2015 1000   CO2 26  12/18/2014 1046   BUN 19 01/16/2015 1000   BUN 13.8 12/18/2014 1046   CREATININE 0.71 01/16/2015 1000   CREATININE 0.7 12/18/2014 1046   CREATININE 0.73 03/05/2014 1044      Component Value Date/Time   CALCIUM 10.1 01/16/2015 1000   CALCIUM 9.8 12/18/2014 1046   ALKPHOS 86 01/16/2015 1000   ALKPHOS 75 12/18/2014 1046   AST 15 01/16/2015 1000   AST 20 12/18/2014 1046   ALT 12 01/16/2015 1000   ALT 27 12/18/2014 1046   BILITOT 0.4 01/16/2015 1000   BILITOT 0.25 12/18/2014 1046       Lab Results  Component Value Date   WBC 4.7 01/16/2015   HGB 11.8* 01/30/2015   HCT 34.0* 01/16/2015   MCV 88.8 01/16/2015   PLT 320.0 01/16/2015   NEUTROABS 2.9 01/16/2015    ASSESSMENT & PLAN:  Breast cancer of upper-outer quadrant of left female breast Metastatic breast cancer in the left breast involving left axillary lymph nodes and isolated L1 vertebral metastases stage IV disease, ER 100 percent, PR 4%, HER-2 negative ratio 1.3 Ki-67 15%  Chemotherapy summary: Completed dose dense Adriamycin Cytoxan 4 started 07/26/2014. Completed 12 weeks of Taxol by 12/18/2014.  Chemotherapy toxicities: alopecia, anemia, fatigue , dizziness Left lumpectomy 01/30/2015: Invasive ductal carcinoma grade 2, 1.2 cm, 12/15 lymph nodes positive, extracapsular tumor extension present, T1c N3M1 stage IV  Status post radiation completed 04/24/2015  Current treatment: I recommend starting antiestrogen therapy with anastrozole 1 mg daily  10 years based on new data for extended adjuvant therapy in high risk  breast cancer patients  Anastrozole counseling:We discussed the risks and benefits of anti-estrogen therapy with aromatase inhibitors. These include but not limited to insomnia, hot flashes, mood changes, vaginal dryness, bone density loss, and weight gain. We strongly believe that the benefits far outweigh the risks. Patient understands these risks and consented to starting treatment. Planned treatment duration is 10 years.  Return to clinic in 3 months for follow-up for toxicity check on anastrozole.     No orders of the defined types were placed in this encounter.   The patient has a good understanding of the overall plan. she agrees with it. she will call with any problems that may develop before the next visit here.   Rulon Eisenmenger, MD

## 2015-05-16 NOTE — Assessment & Plan Note (Signed)
Metastatic breast cancer in the left breast involving left axillary lymph nodes and isolated L1 vertebral metastases stage IV disease, ER 100 percent, PR 4%, HER-2 negative ratio 1.3 Ki-67 15%  Chemotherapy summary: Completed dose dense Adriamycin Cytoxan 4 started 07/26/2014. Completed 12 weeks of Taxol by 12/18/2014.  Chemotherapy toxicities: alopecia, anemia, fatigue , dizziness Left lumpectomy 01/30/2015: Invasive ductal carcinoma grade 2, 1.2 cm, 12/15 lymph nodes positive, extracapsular tumor extension present, T1c N3M1 stage IV  Status post radiation completed 04/24/2015  Current treatment: I recommend starting antiestrogen therapy with anastrozole 1 mg daily  10 years based on new data for extended adjuvant therapy in high risk  breast cancer patients  Anastrozole counseling:We discussed the risks and benefits of anti-estrogen therapy with aromatase inhibitors. These include but not limited to insomnia, hot flashes, mood changes, vaginal dryness, bone density loss, and weight gain. We strongly believe that the benefits far outweigh the risks. Patient understands these risks and consented to starting treatment. Planned treatment duration is 10 years.  Return to clinic in 3 months for follow-up for toxicity check on anastrozole.

## 2015-05-17 ENCOUNTER — Telehealth: Payer: Self-pay | Admitting: Hematology and Oncology

## 2015-05-17 NOTE — Telephone Encounter (Signed)
Left message re f/u for 10/20. Avs and appointments mailed.

## 2015-05-17 NOTE — Progress Notes (Signed)
Electron [employer] representative Note  Diagnosis: Breast Cancer   The patient's CT images from her initial simulation were reviewed to plan her boost treatment to her left breast  lumpectomy cavity.  Measurements were made regarding the size and depth of the surgical bed. The boost to the lumpectomy cavity will be delivered with 15 MeV electrons; 10 Gy in 5 fractions has been prescribed to the 100% isodose line.   A electron [employer] plan was reviewed and approved.  A custom electron cut-out will be used for her boost field.    -----------------------------------  Eppie Gibson, MD

## 2015-05-21 ENCOUNTER — Other Ambulatory Visit: Payer: Self-pay

## 2015-05-21 DIAGNOSIS — C50412 Malignant neoplasm of upper-outer quadrant of left female breast: Secondary | ICD-10-CM

## 2015-05-21 MED ORDER — PALBOCICLIB 125 MG PO CAPS: 125.0000 mg | ORAL_CAPSULE | Freq: Every day | ORAL | Status: DC

## 2015-05-21 NOTE — Progress Notes (Signed)
Ibrance order faxed to [facility].

## 2015-05-22 ENCOUNTER — Encounter: Payer: Self-pay | Admitting: Hematology and Oncology

## 2015-05-22 NOTE — Progress Notes (Signed)
I faxed biologics ibrance request [phone]

## 2015-05-24 ENCOUNTER — Encounter: Payer: Self-pay | Admitting: Hematology and Oncology

## 2015-05-24 NOTE — Progress Notes (Signed)
Per biologics they transferred ibrance scrip to [facility] [phone].

## 2015-05-28 ENCOUNTER — Telehealth: Payer: Self-pay | Admitting: *Deleted

## 2015-05-28 ENCOUNTER — Telehealth: Payer: Self-pay

## 2015-05-28 NOTE — Telephone Encounter (Signed)
[place] WAS GIVEN MANAGED CARE INFORMATION INCLUDING NAME-RAQUEL BROWNING AND HER PHONE AND FAX NUMBER SO THE PRIOR AUTHORIZATION CAN BE COMPLETED BY MANAGED CARE.

## 2015-05-28 NOTE — Telephone Encounter (Signed)
Incoming [facility]

## 2015-05-29 ENCOUNTER — Telehealth: Payer: Self-pay

## 2015-05-29 ENCOUNTER — Other Ambulatory Visit: Payer: Self-pay

## 2015-05-29 ENCOUNTER — Encounter: Payer: Self-pay | Admitting: Hematology and Oncology

## 2015-05-29 DIAGNOSIS — C50912 Malignant neoplasm of unspecified site of left female breast: Secondary | ICD-10-CM

## 2015-05-29 DIAGNOSIS — C50412 Malignant neoplasm of upper-outer quadrant of left female breast: Secondary | ICD-10-CM

## 2015-05-29 MED ORDER — LETROZOLE 2.5 MG PO TABS: 2.5000 mg | ORAL_TABLET | Freq: Every day | ORAL | Status: DC

## 2015-05-29 NOTE — Progress Notes (Signed)
I placed prior auth form for ibance on desk of nurse for dr. Lindi Adie

## 2015-05-29 NOTE — Telephone Encounter (Signed)
Fax for [place] PA put in Ebony's box

## 2015-05-29 NOTE — Progress Notes (Signed)
LMOVM for patient.  Ibrance prior auth submitted to [facility].  Per Dr. Lindi Adie, change anastrazole to letrozole d/t insurance (ibrance/letrozole approved combination).  Let pt know letrozole rx sent to her pharmacy.  Let pt know [facility] will call her when prescription for ibrance ready to ship to arrange for delivery.  Pt to call clinic if she has any questions.  Prior auth faxed to [facility].  Copy returned to managed care.  Original sent to scan.

## 2015-05-31 ENCOUNTER — Other Ambulatory Visit: Payer: Self-pay | Admitting: *Deleted

## 2015-05-31 ENCOUNTER — Encounter: Payer: Self-pay | Admitting: Hematology and Oncology

## 2015-05-31 ENCOUNTER — Telehealth: Payer: Self-pay | Admitting: Oncology

## 2015-05-31 ENCOUNTER — Ambulatory Visit
Admission: RE | Admit: 2015-05-31 | Discharge: 2015-05-31 | Disposition: A | Discharge status: Home / Self Care | Payer: Managed Care, Other (non HMO) | Source: Ambulatory Visit | Attending: Radiation Oncology | Admitting: Radiation Oncology

## 2015-05-31 DIAGNOSIS — C50412 Malignant neoplasm of upper-outer quadrant of left female breast: Secondary | ICD-10-CM

## 2015-05-31 DIAGNOSIS — F32A Depression, unspecified: Secondary | ICD-10-CM

## 2015-05-31 DIAGNOSIS — F329 Major depressive disorder, single episode, unspecified: Secondary | ICD-10-CM

## 2015-05-31 MED ORDER — ARIPIPRAZOLE 10 MG PO TABS: 5.0000 mg | ORAL_TABLET | Freq: Every morning | ORAL | Status: DC

## 2015-05-31 MED ORDER — PALBOCICLIB 125 MG PO CAPS: 125.0000 mg | ORAL_CAPSULE | Freq: Every day | ORAL | Status: DC

## 2015-05-31 NOTE — Telephone Encounter (Signed)
Left a message for Dayanira regarding her appointment with Dr. Isidore Moos today at 4 pm.  Requested a return call.

## 2015-05-31 NOTE — Progress Notes (Signed)
8 pages were [facility]ed [facility] for ibrance 05/29/15

## 2015-06-03 ENCOUNTER — Telehealth: Payer: Self-pay | Admitting: *Deleted

## 2015-06-03 ENCOUNTER — Encounter: Payer: Self-pay | Admitting: Hematology and Oncology

## 2015-06-03 NOTE — Progress Notes (Signed)
[facility] ibrance request has been sent to [facility] and they will contact patient to quote copay and schedule shipment.-- fax is from [facility] [phone] ext [phone]

## 2015-06-03 NOTE — Telephone Encounter (Signed)
The pt call on 06-03-15 and said she didn't miss her apt 05-31-15 with Dr. Isidore Moos, and that she never made this apt, when she ready to make a  apt she will call herself.

## 2015-06-06 ENCOUNTER — Telehealth: Payer: Self-pay

## 2015-06-06 NOTE — Telephone Encounter (Signed)
LMOVM - pt to return call to clinic.  Need to know if she has received and/or started the ibrance.  If so, what day did she start?  Need to confirm ibrance instructions with her.  Also need to know if pt received message about changing arimidex to letrozole and if she has started the letrozole yet.

## 2015-06-07 ENCOUNTER — Telehealth: Payer: Self-pay

## 2015-06-07 NOTE — Telephone Encounter (Signed)
LMOVM - pt to return call to clinic.  Need to follow up on ibrance - need start date.  Also, follow up on letrozole.

## 2015-06-10 ENCOUNTER — Telehealth: Payer: Self-pay | Admitting: *Deleted

## 2015-06-10 NOTE — Telephone Encounter (Signed)
Left VMM for patient to call clinic in reference to Ibrance and Letrozole, if patient has started taking and when.

## 2015-06-14 ENCOUNTER — Telehealth: Payer: Self-pay | Admitting: *Deleted

## 2015-06-14 NOTE — Telephone Encounter (Signed)
Called [facility] in reference to [place]. They have not made a delivery as they have not been able to reach patient.

## 2015-06-14 NOTE — Telephone Encounter (Signed)
Called and left message for patient to call clinic. Need to know if she is taking the Uriah and when she started. Advised that this is an oral chemo and we need to monitor her labs.

## 2015-07-18 ENCOUNTER — Telehealth: Payer: Self-pay

## 2015-07-18 NOTE — Telephone Encounter (Signed)
[facility] - patient has not received any ibrance.  Requested account closed and prescription cancelled.  Kyara confirmed.  Routed to MD

## 2015-08-09 ENCOUNTER — Telehealth: Payer: Self-pay | Admitting: Hematology and Oncology

## 2015-08-09 NOTE — Telephone Encounter (Signed)
Returned patients call and left a message with her appopintment

## 2015-08-09 NOTE — Telephone Encounter (Signed)
Patient called in to reschedule her appointment °

## 2015-08-15 ENCOUNTER — Ambulatory Visit: Payer: Managed Care, Other (non HMO) | Admitting: Hematology and Oncology

## 2015-08-21 NOTE — Assessment & Plan Note (Signed)
Metastatic breast cancer in the left breast involving left axillary lymph nodes and isolated L1 vertebral metastases stage IV disease, ER 100 percent, PR 4%, HER-2 negative ratio 1.3 Ki-67 15%  Chemotherapy summary: Completed dose dense Adriamycin Cytoxan 4 started 07/26/2014. Completed 12 weeks of Taxol by 12/18/2014.  Chemotherapy toxicities: alopecia, anemia, fatigue , dizziness Left lumpectomy 01/30/2015: Invasive ductal carcinoma grade 2, 1.2 cm, 12/15 lymph nodes positive, extracapsular tumor extension present, T1c N3M1 stage IV Status post radiation completed 04/24/2015  Current treatment: Anastrozole since July 2016 Anastrozole Toxicities:  RTC in 6 months

## 2015-08-22 ENCOUNTER — Telehealth: Payer: Self-pay | Admitting: *Deleted

## 2015-08-22 ENCOUNTER — Telehealth: Payer: Self-pay | Admitting: Hematology and Oncology

## 2015-08-22 ENCOUNTER — Ambulatory Visit (HOSPITAL_BASED_OUTPATIENT_CLINIC_OR_DEPARTMENT_OTHER): Payer: Managed Care, Other (non HMO) | Admitting: Hematology and Oncology

## 2015-08-22 ENCOUNTER — Encounter: Payer: Self-pay | Admitting: Hematology and Oncology

## 2015-08-22 ENCOUNTER — Ambulatory Visit (HOSPITAL_BASED_OUTPATIENT_CLINIC_OR_DEPARTMENT_OTHER): Payer: Managed Care, Other (non HMO)

## 2015-08-22 VITALS — BP 147/69 | HR 69 | Temp 98.2°F | Resp 19 | Ht 65.0 in | Wt 254.0 lb

## 2015-08-22 DIAGNOSIS — Z23 Encounter for immunization: Secondary | ICD-10-CM | POA: Diagnosis not present

## 2015-08-22 DIAGNOSIS — C773 Secondary and unspecified malignant neoplasm of axilla and upper limb lymph nodes: Secondary | ICD-10-CM | POA: Diagnosis not present

## 2015-08-22 DIAGNOSIS — C7951 Secondary malignant neoplasm of bone: Secondary | ICD-10-CM

## 2015-08-22 DIAGNOSIS — C50412 Malignant neoplasm of upper-outer quadrant of left female breast: Secondary | ICD-10-CM

## 2015-08-22 LAB — COMPREHENSIVE METABOLIC PANEL (CC13)
ALBUMIN: 3.8 g/dL (ref 3.5–5.0)
ALT: 11 U/L (ref 0–55)
AST: 13 U/L (ref 5–34)
Alkaline Phosphatase: 93 U/L (ref 40–150)
Anion Gap: 6 mEq/L (ref 3–11)
BUN: 17.6 mg/dL (ref 7.0–26.0)
CHLORIDE: 107 meq/L (ref 98–109)
CO2: 24 mEq/L (ref 22–29)
Calcium: 9.8 mg/dL (ref 8.4–10.4)
Creatinine: 0.7 mg/dL (ref 0.6–1.1)
EGFR: >90 mL/min/{1.73_m2} (ref >90)
GLUCOSE: 91 mg/dL (ref 70–140)
Potassium: 4.1 mEq/L (ref 3.5–5.1)
SODIUM: 138 meq/L (ref 136–145)
Total Bilirubin: 0.3 mg/dL (ref 0.20–1.20)
Total Protein: 7 g/dL (ref 6.4–8.3)

## 2015-08-22 LAB — CBC WITH DIFFERENTIAL/PLATELET
BASO%: 0.9 % (ref 0.0–2.0)
BASOS ABS: 0 10*3/uL (ref 0.0–0.1)
EOS%: 3.8 % (ref 0.0–7.0)
Eosinophils Absolute: 0.2 10*3/uL (ref 0.0–0.5)
HCT: 37.9 % (ref 34.8–46.6)
HEMOGLOBIN: 12.3 g/dL (ref 11.6–15.9)
LYMPH%: 25.2 % (ref 14.0–49.7)
MCH: 29.1 pg (ref 25.1–34.0)
MCHC: 32.5 g/dL (ref 31.5–36.0)
MCV: 89.5 fL (ref 79.5–101.0)
MONO#: 0.5 10*3/uL (ref 0.1–0.9)
MONO%: 11.6 % (ref 0.0–14.0)
NEUT#: 2.6 10*3/uL (ref 1.5–6.5)
NEUT%: 58.5 % (ref 38.4–76.8)
Platelets: 316 10*3/uL (ref 145–400)
RBC: 4.24 10*6/uL (ref 3.70–5.45)
RDW: 14.8 % — ABNORMAL HIGH (ref 11.2–14.5)
WBC: 4.5 10*3/uL (ref 3.9–10.3)
lymph#: 1.1 10*3/uL (ref 0.9–3.3)

## 2015-08-22 MED ORDER — INFLUENZA VAC SPLIT QUAD 0.5 ML IM SUSY
0.5000 mL | PREFILLED_SYRINGE | Freq: Once | INTRAMUSCULAR | Status: AC
  Administered 2015-08-22: 0.5 mL via INTRAMUSCULAR
  Filled 2015-08-22: qty 0.5

## 2015-08-22 NOTE — Progress Notes (Signed)
Patient Care Team: Burnis Medin, MD as PCP - General (Internal Medicine) Nicholas Lose, MD as Consulting Physician (Hematology and Oncology) [name], MD as Consulting Physician (General Surgery) Eppie Gibson, MD as Attending Physician (Radiation Oncology)  DIAGNOSIS: Breast cancer of upper-outer quadrant of left female breast [facility])   Staging form: Breast, AJCC 7th Edition     Clinical: Stage IV (T2, N1, M1) - Signed by Rulon Eisenmenger, MD on 08/16/2014       Staging comments: Staged at breast conference 06/06/14.      Pathologic stage from 02/01/2015: Stage IV (yT1c, N3, M1) - Signed by Enid Cutter, MD on 03/07/2015       Staging comments: Staged on final lumpectomy specimen by Dr. Donato Heinz.    SUMMARY OF ONCOLOGIC HISTORY:   Breast cancer of upper-outer quadrant of left female breast ([place])   05/18/2014 Mammogram Suspicious left upper outer quadrant 5 cm segmental area of calcifications.    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   05/30/2014 Breast MRI Left Breast: 12oclock: 21 x 23 x 30 mm, Numerous  level 1 and 2 LN; Liver lesions cycts by Liver MRI   06/07/2014 Initial Biopsy Left axillary lymph node biopsy invasive ductal carcinoma ER 100%, PR 11% Ki-67 15% HER-2 negative ratio 1.41   06/13/2014 PET scan left breast activity, hypermetabolic left axillary and left retropectoral lymph node, small lucent lesion in L1 vertebra which was biopsied and proven to be bone metastases   07/19/2014 Initial Biopsy Biopsy of L1 vertebra metastatic carcinoma breast primary, ER 100%, PR 4%, HER-2 negative ratio 1.3   07/26/2014 - 12/18/2014 Neo-Adjuvant Chemotherapy Even though she has L1 solitary bone metastases, patient is being treated definitively with neoadjuvant chemotherapy with dose dense Adriamycin and Cytoxan to be followed by weekly Taxol x12   12/25/2014 PET scan Interval improvement in size and  metabolic activity associated with left axillary lymph nodes consistent with treatment response, no hypermetabolic distant metastases, L1 vertebral body has no activity   12/25/2014 Breast MRI No residual enhancement left breast, decreased left axillary lymphadenopathy, indicating response to treatment but lymph nodes are still present   01/30/2015 Surgery Left breast lumpectomy: Invasive ductal carcinoma grade 2, 1.2 cm, 12/15 lymph nodes positive, extracapsular tumor extension present, T1c N3M1 stage IV    03/12/2015 - 04/24/2015 Radiation Therapy  adjuvant radiation therapy with Dr. Isidore Moos   05/16/2015 -  Anti-estrogen oral therapy Anastrozole 1 mg daily 10 years is the plan    CHIEF COMPLIANT: Follow-up on anastrozole   INTERVAL HISTORY: Kimberly Reed is a 59 year old with above-mentioned history of stage IV breast cancer with solitary spine metastases. She is tolerating anastrozole extremely well without any major problems or concerns. Denies hot flashes or myalgias. We had prescribed her [name] because she has stage IV disease but she was not reachable in spite of multiple attempts. Hence we decided to discontinue Ibrance. She tells me that she cannot come to often for follow-ups because of her work life schedule.  REVIEW OF SYSTEMS:   Constitutional: Denies fevers, chills or abnormal weight loss Eyes: Denies blurriness of vision Ears, nose, mouth, throat, and face: Denies mucositis or sore throat Respiratory: Denies cough, dyspnea or wheezes Cardiovascular: Denies palpitation, chest discomfort or lower extremity swelling Gastrointestinal:  Denies nausea, heartburn or change in bowel habits Skin: Denies abnormal skin rashes Lymphatics: Denies new lymphadenopathy or easy bruising Neurological:Denies numbness, tingling or new weaknesses Behavioral/Psych:  Chronic problems with depression Breast:  denies any pain or lumps or nodules in either breasts All other systems were reviewed with the  patient and are negative.  I have reviewed the past medical history, past surgical history, social history and family history with the patient and they are unchanged from previous note.  ALLERGIES:  is allergic to other and effexor.  MEDICATIONS:  Current Outpatient Prescriptions  Medication Sig Dispense Refill  . ALPRAZolam (XANAX) 1 MG tablet Take 1 tablet (1 mg total) by mouth 3 (three) times daily as needed for anxiety. 60 tablet 1  . anastrozole (ARIMIDEX) 1 MG tablet Take 1 tablet (1 mg total) by mouth daily. 90 tablet 3  . ARIPiprazole (ABILIFY) 10 MG tablet Take 0.5 tablets (5 mg total) by mouth every morning. 90 tablet 0  . brimonidine-timolol (COMBIGAN) 0.2-0.5 % ophthalmic solution Place 1 drop into the right eye every 12 (twelve) hours.     . dorzolamide (TRUSOPT) 2 % ophthalmic solution Place 1 drop into the right eye 2 (two) times daily.     . hyaluronate sodium (RADIAPLEXRX) GEL Apply 1 application topically once.    [name] letrozole (FEMARA) 2.5 MG tablet Take 1 tablet (2.5 mg total) by mouth daily. 90 tablet 1  . levothyroxine (SYNTHROID) 100 MCG tablet Take 1 tablet (100 mcg total) by mouth daily before breakfast. 90 tablet 3  . non-metallic deodorant (ALRA) MISC Apply 1 application topically daily as needed.    . ondansetron (ZOFRAN-ODT) 4 MG disintegrating tablet     . OVER THE COUNTER MEDICATION Take 1 capsule by mouth daily.    [name] oxyCODONE-acetaminophen (ROXICET) 5-325 MG per tablet Take 1-2 tablets by mouth every 4 (four) hours as needed for severe pain. 40 tablet 0  . palbociclib (IBRANCE) 125 MG capsule Take 1 capsule (125 mg total) by mouth daily with breakfast. Take whole with food for 21 days.  Then take 7 days off. 21 capsule 0  . Travoprost, BAK Free, (TRAVATAN) 0.004 % SOLN ophthalmic solution Place 1 drop into the right eye at bedtime.      No current facility-administered medications for this visit.   Facility-Administered Medications Ordered in Other Visits    Medication Dose Route Frequency Provider Last Rate Last Dose  . sodium chloride 0.9 % injection 10 mL  10 mL Intracatheter PRN Nicholas Lose, MD   10 mL at 10/12/14 1359  . sodium chloride 0.9 % injection 10 mL  10 mL Intracatheter PRN Nicholas Lose, MD   10 mL at 12/11/14 1653    PHYSICAL EXAMINATION: ECOG PERFORMANCE STATUS: 1 - Symptomatic but completely ambulatory  Filed Vitals:   08/22/15 0852  BP: 147/69  Pulse: 69  Temp: 98.2 F (36.8 C)  Resp: 19   Filed Weights   08/22/15 0852  Weight: 254 lb (115.214 kg)    GENERAL:alert, no distress and comfortable SKIN: skin color, texture, turgor are normal, no rashes or significant lesions EYES: normal, Conjunctiva are pink and non-injected, sclera clear OROPHARYNX:no exudate, no erythema and lips, buccal mucosa, and tongue normal  NECK: supple, thyroid normal size, non-tender, without nodularity LYMPH:  no palpable lymphadenopathy in the cervical, axillary or inguinal LUNGS: clear to auscultation and percussion with normal breathing effort HEART: regular rate & rhythm and no murmurs and no lower extremity edema ABDOMEN:abdomen soft, non-tender and normal bowel sounds Musculoskeletal:no cyanosis of digits and no clubbing  NEURO: alert & oriented x 3 with fluent speech, no focal motor/sensory deficits  LABORATORY DATA:  I have reviewed the data as listed   Chemistry      Component Value Date/Time   NA 140 01/16/2015 1000   NA 139 12/18/2014 1046   K 4.9 01/16/2015 1000   K 4.0 12/18/2014 1046   CL 104 01/16/2015 1000   CO2 30 01/16/2015 1000   CO2 26 12/18/2014 1046   BUN 19 01/16/2015 1000   BUN 13.8 12/18/2014 1046   CREATININE 0.71 01/16/2015 1000   CREATININE 0.7 12/18/2014 1046   CREATININE 0.73 03/05/2014 1044      Component Value Date/Time   CALCIUM 10.1 01/16/2015 1000   CALCIUM 9.8 12/18/2014 1046   ALKPHOS 86 01/16/2015 1000   ALKPHOS 75 12/18/2014 1046   AST 15 01/16/2015 1000   AST 20 12/18/2014 1046    ALT 12 01/16/2015 1000   ALT 27 12/18/2014 1046   BILITOT 0.4 01/16/2015 1000   BILITOT 0.25 12/18/2014 1046       Lab Results  Component Value Date   WBC 4.7 01/16/2015   HGB 11.8* 01/30/2015   HCT 34.0* 01/16/2015   MCV 88.8 01/16/2015   PLT 320.0 01/16/2015   NEUTROABS 2.9 01/16/2015   ASSESSMENT & PLAN:  Breast cancer of upper-outer quadrant of left female breast Metastatic breast cancer in the left breast involving left axillary lymph nodes and isolated L1 vertebral metastases stage IV disease, ER 100 percent, PR 4%, HER-2 negative ratio 1.3 Ki-67 15%  Chemotherapy summary: Completed dose dense Adriamycin Cytoxan 4 started 07/26/2014. Completed 12 weeks of Taxol by 12/18/2014.  Chemotherapy toxicities: alopecia, anemia, fatigue , dizziness Left lumpectomy 01/30/2015: Invasive ductal carcinoma grade 2, 1.2 cm, 12/15 lymph nodes positive, extracapsular tumor extension present, T1c N3M1 stage IV Status post radiation completed 04/24/2015  Current treatment: Anastrozole since July 2016 (because she had stage IV disease, I recommended that she take Ibrance in addition to anastrozole. However patient could not be reached after extensively trying to get a hold of her. So this was never started). Because of her personal issues, especially with her work schedule, she cannot come monthly for lab, checks. Therefore we decided to discontinue Ibrance orders (she never received this prescription)  However the patient would like to receive Ibrance, she will have to make the commitment to come for lab count checks and more frequent follow-ups.  Anastrozole Toxicities: Denies any hot flashes or myalgias or fatigue.  RTC in 6 months   No orders of the defined types were placed in this encounter.   The patient has a good understanding of the overall plan. she agrees with it. she will call with any problems that may develop before the next visit here.   Rulon Eisenmenger,  MD 08/22/2015

## 2015-08-22 NOTE — Telephone Encounter (Signed)
"  Today I forgot to ask Dr. Lindi Adie the name of the place I had surgical procedure to my back last year.  I need to call to find out if I have/owe a bill.  I don't know what it was, where it was performed or who did it.  I think it was October or November of last year.  I don't know what the procedure was only that Dr. Lindi Adie ordered it.  Return number [phone]."  This nurse advised she call [facility].  Reports she "has called and aware of her bill owed to [facility]".  Unable to find anything in surgical history or procedures except for port-a-cath placement and discussion of vertebral RT in 2015.  Will notify provider of patient's request.

## 2015-08-22 NOTE — Telephone Encounter (Signed)
Patient would not make additional appointments today as she does not know her schedule

## 2015-08-28 ENCOUNTER — Telehealth: Payer: Self-pay | Admitting: *Deleted

## 2015-08-28 ENCOUNTER — Encounter: Payer: Self-pay | Admitting: Hematology and Oncology

## 2015-08-28 ENCOUNTER — Telehealth: Payer: Self-pay | Admitting: Internal Medicine

## 2015-08-28 ENCOUNTER — Other Ambulatory Visit: Payer: Self-pay | Admitting: Family Medicine

## 2015-08-28 DIAGNOSIS — E039 Hypothyroidism, unspecified: Secondary | ICD-10-CM

## 2015-08-28 NOTE — Telephone Encounter (Signed)
Pt call to say that she was told to schedule labs for her thyroid and then see the doctor. We need an order placed to schedule labs .[name]

## 2015-08-28 NOTE — Progress Notes (Signed)
I placed work forms on desk of nurse for dr. Lindi Adie

## 2015-08-28 NOTE — Telephone Encounter (Signed)
Returned pt call.  Per Dr. Lindi Adie, pt needed clarification on her medications.  Pt is take anastrazole only.  No ibrance, no femara.  Pt voiced understanding.   Both orders dc'd on chart.  Pharmacy called; femara/ibrance order cancelled.

## 2015-08-28 NOTE — Telephone Encounter (Signed)
Patient called reporting she "missed a call from Dr. Lindi Adie last week.  This is in reference to medications we discussed at my last visit, Arimidex, Femara.  It's hard to reach him so please have him call me at [phone]."

## 2015-08-28 NOTE — Telephone Encounter (Signed)
Lab order placed in the system. 

## 2015-08-29 NOTE — Telephone Encounter (Signed)
Pt scheduled  

## 2015-08-30 ENCOUNTER — Other Ambulatory Visit (INDEPENDENT_AMBULATORY_CARE_PROVIDER_SITE_OTHER): Payer: Managed Care, Other (non HMO)

## 2015-08-30 DIAGNOSIS — E039 Hypothyroidism, unspecified: Secondary | ICD-10-CM

## 2015-08-30 LAB — TSH: TSH: 6.28 u[IU]/mL — AB (ref 0.35–4.50)

## 2015-09-02 ENCOUNTER — Encounter: Payer: Self-pay | Admitting: Hematology and Oncology

## 2015-09-02 NOTE — Progress Notes (Signed)
fmla forms were faxed by nurse to [phone]. Copy in file

## 2015-09-06 ENCOUNTER — Ambulatory Visit (INDEPENDENT_AMBULATORY_CARE_PROVIDER_SITE_OTHER): Payer: Managed Care, Other (non HMO) | Admitting: Internal Medicine

## 2015-09-06 ENCOUNTER — Encounter: Payer: Self-pay | Admitting: Internal Medicine

## 2015-09-06 VITALS — BP 120/80 | Temp 98.0°F | Wt 256.3 lb

## 2015-09-06 DIAGNOSIS — E039 Hypothyroidism, unspecified: Secondary | ICD-10-CM

## 2015-09-06 DIAGNOSIS — C50412 Malignant neoplasm of upper-outer quadrant of left female breast: Secondary | ICD-10-CM | POA: Diagnosis not present

## 2015-09-06 MED ORDER — LEVOTHYROXINE SODIUM 112 MCG PO TABS: 112.0000 ug | ORAL_TABLET | Freq: Every day | ORAL | Status: DC

## 2015-09-06 NOTE — Patient Instructions (Signed)
Increase dose of  Levothyroxine .  To 112.  Take the medicatoin without anything else and one hour before other medications( ability)  Plan check tsh in 3 months .  FU visit depending on how you are doing  .  Lab Results  Component Value Date   WBC 4.5 08/22/2015   HGB 12.3 08/22/2015   HCT 37.9 08/22/2015   PLT 316 08/22/2015   GLUCOSE 91 08/22/2015   CHOL 248* 01/16/2015   TRIG 96.0 01/16/2015   HDL 53.10 01/16/2015   LDLDIRECT 167.3 03/16/2011   LDLCALC 176* 01/16/2015   ALT 11 08/22/2015   AST 13 08/22/2015   NA 138 08/22/2015   K 4.1 08/22/2015   CL 104 01/16/2015   CREATININE 0.7 08/22/2015   BUN 17.6 08/22/2015   CO2 24 08/22/2015   TSH 6.28* 08/30/2015   INR 1.02 07/19/2014

## 2015-09-06 NOTE — Progress Notes (Signed)
Pre visit review using our clinic review tool, if applicable. No additional management support is needed unless otherwise documented below in the visit note.  Chief Complaint  Patient presents with  . Follow-up  . Hypothyroidism    HPI: Kimberly Reed 59 y.o. w breast cancer and depression and hypothyroid disease comes in for fu of  Thyroid management .,   On 100 lveothx  dialy  adherent takes with the Abilify but no other food pills etc    Tired   Not seeing psych cause of $ cost  . Sees oncology in another 4-6 months or so  Working FT at this point .\ Says she felt better on armour  But  Too expensive and not covered  ROS: See pertinent positives and negatives per HPI. No cp sob  No acute changes  Tired   Past Medical History  Diagnosis Date  . Hypothyroid   . Glaucoma   . Hyperlipemia   . Migraine without aura   . History of syncope 2008    loss of bladder control eval by Neuro  . Atypical chest pain   . Family history of heart disease   . Anxiety   . Depression   . Hot flashes   . Anemia   . Insomnia   . Cancer ([place])     left breast dx. [id number] chemo planned to start 07-09-14- Dr. Lindi Adie, [facility]  . Wears glasses   . Breast cancer ([place]) 05/2014    left  . Glaucoma     Family History  Problem Relation Age of Onset  . Aneurysm Mother   . Kidney disease Mother   . Hypertension Father   . Dementia Father   . Kidney disease Brother   . Heart disease      "all of moms side"  . Heart failure Cousin   . Depression      Social History   Social History  . Marital Status: Single    Spouse Name: N/A  . Number of Children: N/A  . Years of Education: N/A   Occupational History  . 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 Smoking status: Never Smoker   . Smokeless tobacco: Never Used  . Alcohol Use: No  . Drug Use: No     Comment: in 20's but not now  . Sexual Activity:    Partners: Female     Comment: menarche age 68, P 0,  no HRT   Other Topics Concern  . 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     Hh of 1  2 cats      No tad    8 hours of sleep.       On  disabilirty for  Breast cancer .   Work now  71- 40 per week           Outpatient Prescriptions Prior to Visit  Medication Sig Dispense Refill  . anastrozole (ARIMIDEX) 1 MG tablet Take 1 tablet (1 mg total) by mouth daily. 90 tablet 3  . ARIPiprazole (ABILIFY) 10 MG tablet Take 0.5 tablets (5 mg total) by mouth every morning. 90 tablet 0  . brimonidine-timolol (COMBIGAN) 0.2-0.5 % ophthalmic solution Place 1 drop into the right eye every 12 (twelve) hours.     . dorzolamide (TRUSOPT) 2 % ophthalmic solution Place 1 drop into the  right eye 2 (two) times daily.     . non-metallic deodorant Jethro Poling) MISC Apply 1 application topically daily as needed.    [name] OVER THE COUNTER MEDICATION Take 1 capsule by mouth daily.    . Travoprost, BAK Free, (TRAVATAN) 0.004 % SOLN ophthalmic solution Place 1 drop into the right eye at bedtime.     [name] levothyroxine (SYNTHROID) 100 MCG tablet Take 1 tablet (100 mcg total) by mouth daily before breakfast. 90 tablet 3  . ALPRAZolam (XANAX) 1 MG tablet Take 1 tablet (1 mg total) by mouth 3 (three) times daily as needed for anxiety. 60 tablet 1  . hyaluronate sodium (RADIAPLEXRX) GEL Apply 1 application topically once.    . ondansetron (ZOFRAN-ODT) 4 MG disintegrating tablet     . oxyCODONE-acetaminophen (ROXICET) 5-325 MG per tablet Take 1-2 tablets by mouth every 4 (four) hours as needed for severe pain. 40 tablet 0   Facility-Administered Medications Prior to Visit  Medication Dose Route Frequency Provider Last Rate Last Dose  . sodium chloride 0.9 % injection 10 mL  10 mL Intracatheter PRN Nicholas Lose, MD   10 mL at 10/12/14 1359  . sodium chloride 0.9 % injection 10 mL  10 mL Intracatheter PRN Nicholas Lose, MD   10 mL at 12/11/14 1653     EXAM:  BP 120/80 mmHg   Temp(Src) 98 F (36.7 C) (Oral)  Wt 256 lb 4.8 oz (116.257 kg)  LMP 02/15/2013  Body mass index is 42.65 kg/(m^2).  GENERAL: vitals reviewed and listed above, alert, oriented, appears well hydrated and in no acute distress HEENT: atraumatic, conjunctiva  clear, no obvious abnormalities on inspection of external nose and ears  Wearing glasses  NECK: no obvious masses on inspection palpation   Thyroid pal no obv nodules MS: moves all extremities without noticeable focal  Abnormality no sig edema    Lab Results  Component Value Date   WBC 4.5 08/22/2015   HGB 12.3 08/22/2015   HCT 37.9 08/22/2015   PLT 316 08/22/2015   GLUCOSE 91 08/22/2015   CHOL 248* 01/16/2015   TRIG 96.0 01/16/2015   HDL 53.10 01/16/2015   LDLDIRECT 167.3 03/16/2011   LDLCALC 176* 01/16/2015   ALT 11 08/22/2015   AST 13 08/22/2015   NA 138 08/22/2015   K 4.1 08/22/2015   CL 104 01/16/2015   CREATININE 0.7 08/22/2015   BUN 17.6 08/22/2015   CO2 24 08/22/2015   TSH 6.28* 08/30/2015   INR 1.02 07/19/2014    ASSESSMENT AND PLAN:  Discussed the following assessment and plan:  Hypothyroidism, unspecified hypothyroidism type - states she felt better on  armour but too expensive   take hour before first med increase dose and check tsh 3 mos no ov needed if doing ok.   Breast cancer of upper-outer quadrant of left female breast [facility]) See below  -Patient advised to return or notify health care team  if symptoms worsen ,persist or new concerns arise.  Patient Instructions   Increase dose of  Levothyroxine .  To 112.  Take the medicatoin without anything else and one hour before other medications( ability)  Plan check tsh in 3 months .  FU visit depending on how you are doing  .  Lab Results  Component Value Date   WBC 4.5 08/22/2015   HGB 12.3 08/22/2015   HCT 37.9 08/22/2015   PLT 316 08/22/2015   GLUCOSE 91 08/22/2015   CHOL 248* 01/16/2015   TRIG 96.0 01/16/2015  HDL 53.10 01/16/2015    LDLDIRECT 167.3 03/16/2011   LDLCALC 176* 01/16/2015   ALT 11 08/22/2015   AST 13 08/22/2015   NA 138 08/22/2015   K 4.1 08/22/2015   CL 104 01/16/2015   CREATININE 0.7 08/22/2015   BUN 17.6 08/22/2015   CO2 24 08/22/2015   TSH 6.28* 08/30/2015   INR 1.02 07/19/2014        Camellia Popescu K. Evelyna Folker M.D.

## 2015-10-29 ENCOUNTER — Telehealth: Payer: Self-pay | Admitting: *Deleted

## 2015-10-29 NOTE — Telephone Encounter (Signed)
Pt reports she needs to be seen, she thinks "the cancer is back".  She reports she "not feeling well".

## 2015-10-29 NOTE — Telephone Encounter (Signed)
Patient left message on Triage VM that "scheduling told me I had to talk to Triage."  Tried to call patient back.  Left message to return our call.

## 2015-10-30 NOTE — Telephone Encounter (Signed)
Continuation of note 10/29/15 6:02 pm.  Pt reports she feels the same as the last time she had cancer, but also reports she felt nothing prior to diagnosis.  States the symptoms she is feeling now occurred after her diagnosis.  Reports that since New Years she cannot sleep, is shaking and shivering, alternates between burning up and feeling cold all over, is dizzy, weak in the legs, her whole body feels "tense like it is throbbing", she cannot relax, and her chest and throat feel inflamed.  Pt believes she can feel a tumor.  Pt reports that nothing makes the symptoms better and that they are worse when she lies down.  Reports feeling like her heart is racing and she like she can't breathe when she is lying down.    Appt made for pt on 1/25 (pt preference).

## 2015-11-06 ENCOUNTER — Telehealth: Payer: Self-pay | Admitting: Hematology and Oncology

## 2015-11-06 NOTE — Telephone Encounter (Signed)
Patient called in and left a message to cancel her 1/25 appointment and asked for a return call which i did and left her a message 1:06 11/06/15

## 2015-11-06 NOTE — Telephone Encounter (Signed)
Patient called back in to put appointment back on schedule

## 2015-11-11 ENCOUNTER — Ambulatory Visit (HOSPITAL_BASED_OUTPATIENT_CLINIC_OR_DEPARTMENT_OTHER): Payer: Managed Care, Other (non HMO) | Admitting: Hematology and Oncology

## 2015-11-11 ENCOUNTER — Encounter: Payer: Self-pay | Admitting: Hematology and Oncology

## 2015-11-11 VITALS — BP 118/76 | HR 71 | Temp 98.2°F | Resp 19 | Wt 256.8 lb

## 2015-11-11 DIAGNOSIS — C773 Secondary and unspecified malignant neoplasm of axilla and upper limb lymph nodes: Secondary | ICD-10-CM | POA: Diagnosis not present

## 2015-11-11 DIAGNOSIS — R5383 Other fatigue: Secondary | ICD-10-CM

## 2015-11-11 DIAGNOSIS — R0789 Other chest pain: Secondary | ICD-10-CM | POA: Diagnosis not present

## 2015-11-11 DIAGNOSIS — C50412 Malignant neoplasm of upper-outer quadrant of left female breast: Secondary | ICD-10-CM | POA: Diagnosis not present

## 2015-11-11 DIAGNOSIS — M13862 Other specified arthritis, left knee: Secondary | ICD-10-CM

## 2015-11-11 DIAGNOSIS — C7951 Secondary malignant neoplasm of bone: Secondary | ICD-10-CM

## 2015-11-11 DIAGNOSIS — M13861 Other specified arthritis, right knee: Secondary | ICD-10-CM

## 2015-11-11 DIAGNOSIS — R531 Weakness: Secondary | ICD-10-CM

## 2015-11-11 NOTE — Progress Notes (Signed)
Patient Care Team: Burnis Medin, MD as PCP - General (Internal Medicine) Nicholas Lose, MD as Consulting Physician (Hematology and Oncology) [name], MD as Consulting Physician (General Surgery) Eppie Gibson, MD as Attending Physician (Radiation Oncology)  DIAGNOSIS: Breast cancer of upper-outer quadrant of left female breast [facility])   Staging form: Breast, AJCC 7th Edition     Clinical: Stage IV (T2, N1, M1) - Signed by Rulon Eisenmenger, MD on 08/16/2014       Staging comments: Staged at breast conference 06/06/14.      Pathologic stage from 02/01/2015: Stage IV (yT1c, N3, M1) - Signed by Enid Cutter, MD on 03/07/2015       Staging comments: Staged on final lumpectomy specimen by Dr. Donato Heinz.    SUMMARY OF ONCOLOGIC HISTORY:   Breast cancer of upper-outer quadrant of left female breast ([place])   05/18/2014 Mammogram Suspicious left upper outer quadrant 5 cm segmental area of calcifications.    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   05/30/2014 Breast MRI Left Breast: 12oclock: 21 x 23 x 30 mm, Numerous  level 1 and 2 LN; Liver lesions cycts by Liver MRI   06/07/2014 Initial Biopsy Left axillary lymph node biopsy invasive ductal carcinoma ER 100%, PR 11% Ki-67 15% HER-2 negative ratio 1.41   06/13/2014 PET scan left breast activity, hypermetabolic left axillary and left retropectoral lymph node, small lucent lesion in L1 vertebra which was biopsied and proven to be bone metastases   07/19/2014 Initial Biopsy Biopsy of L1 vertebra metastatic carcinoma breast primary, ER 100%, PR 4%, HER-2 negative ratio 1.3   07/26/2014 - 12/18/2014 Neo-Adjuvant Chemotherapy Even though she has L1 solitary bone metastases, patient is being treated definitively with neoadjuvant chemotherapy with dose dense Adriamycin and Cytoxan to be followed by weekly Taxol x12   12/25/2014 PET scan Interval improvement in size and  metabolic activity associated with left axillary lymph nodes consistent with treatment response, no hypermetabolic distant metastases, L1 vertebral body has no activity   12/25/2014 Breast MRI No residual enhancement left breast, decreased left axillary lymphadenopathy, indicating response to treatment but lymph nodes are still present   01/30/2015 Surgery Left breast lumpectomy: Invasive ductal carcinoma grade 2, 1.2 cm, 12/15 lymph nodes positive, extracapsular tumor extension present, T1c N3M1 stage IV    03/12/2015 - 04/24/2015 Radiation Therapy  adjuvant radiation therapy with Dr. Isidore Moos   05/16/2015 -  Anti-estrogen oral therapy Anastrozole 1 mg daily 10 years is the plan; [name] was recommended but she was not reachable and hence it was not filled    CHIEF COMPLIANT: "I feel that the cancer is back"  INTERVAL HISTORY: Kimberly Reed is a 60 year old with above-mentioned history of stage IV breast cancer with solitary metastases who underwent neoadjuvant chemotherapy followed by lumpectomy and radiation and is currently on anastrozole. She came in for an urgent visit because she felt that the cancer is back based upon symptoms of dizziness in the head, arthritis like pains in the knee, tingling sensation and paresthesias in the arm as well as generalized fatigue and weakness.  REVIEW OF SYSTEMS:   Constitutional: Denies fevers, chills or abnormal weight loss Eyes: Denies blurriness of vision Ears, nose, mouth, throat, and face: Denies mucositis or sore throat Respiratory: Denies cough, dyspnea or wheezes Cardiovascular: Denies palpitation, chest discomfort Gastrointestinal:  Denies nausea, heartburn or change in bowel habits Skin: Denies abnormal skin rashes Lymphatics: Denies new lymphadenopathy or easy  bruising Neurological:Denies numbness, tingling or new weaknesses Behavioral/Psych: Appears to be sad Extremities: No lower extremity edema Breast: Firmness and nodularity at the site of the  surgery and radiation All other systems were reviewed with the patient and are negative.  I have reviewed the past medical history, past surgical history, social history and family history with the patient and they are unchanged from previous note.  ALLERGIES:  is allergic to other and effexor.  MEDICATIONS:  Current Outpatient Prescriptions  Medication Sig Dispense Refill  . anastrozole (ARIMIDEX) 1 MG tablet Take 1 tablet (1 mg total) by mouth daily. 90 tablet 3  . ARIPiprazole (ABILIFY) 10 MG tablet Take 0.5 tablets (5 mg total) by mouth every morning. 90 tablet 0  . brimonidine-timolol (COMBIGAN) 0.2-0.5 % ophthalmic solution Place 1 drop into the right eye every 12 (twelve) hours.     . dorzolamide (TRUSOPT) 2 % ophthalmic solution Place 1 drop into the right eye 2 (two) times daily.     [name] levothyroxine (SYNTHROID, LEVOTHROID) 112 MCG tablet Take 1 tablet (112 mcg total) by mouth daily. 90 tablet 1  . non-metallic deodorant (ALRA) MISC Apply 1 application topically daily as needed.    [name] OVER THE COUNTER MEDICATION Take 1 capsule by mouth daily.    . Travoprost, BAK Free, (TRAVATAN) 0.004 % SOLN ophthalmic solution Place 1 drop into the right eye at bedtime.      No current facility-administered medications for this visit.   Facility-Administered Medications Ordered in Other Visits  Medication Dose Route Frequency Provider Last Rate Last Dose  . sodium chloride 0.9 % injection 10 mL  10 mL Intracatheter PRN Nicholas Lose, MD   10 mL at 10/12/14 1359  . sodium chloride 0.9 % injection 10 mL  10 mL Intracatheter PRN Nicholas Lose, MD   10 mL at 12/11/14 1653    PHYSICAL EXAMINATION: ECOG PERFORMANCE STATUS: 1 - Symptomatic but completely ambulatory  Filed Vitals:   11/11/15 1354  BP: 118/76  Pulse: 71  Temp: 98.2 F (36.8 C)  Resp: 19   Filed Weights   11/11/15 1354  Weight: 256 lb 12.8 oz (116.484 kg)    GENERAL:alert, no distress and comfortable SKIN: skin color,  texture, turgor are normal, no rashes or significant lesions EYES: normal, Conjunctiva are pink and non-injected, sclera clear OROPHARYNX:no exudate, no erythema and lips, buccal mucosa, and tongue normal  NECK: supple, thyroid normal size, non-tender, without nodularity LYMPH:  no palpable lymphadenopathy in the cervical, axillary or inguinal LUNGS: clear to auscultation and percussion with normal breathing effort HEART: regular rate & rhythm and no murmurs and no lower extremity edema ABDOMEN:abdomen soft, non-tender and normal bowel sounds MUSCULOSKELETAL:no cyanosis of digits and no clubbing  NEURO: alert & oriented x 3 with fluent speech, no focal motor/sensory deficits EXTREMITIES: No lower extremity edema   LABORATORY DATA:  I have reviewed the data as listed   Chemistry      Component Value Date/Time   NA 138 08/22/2015 0951   NA 140 01/16/2015 1000   K 4.1 08/22/2015 0951   K 4.9 01/16/2015 1000   CL 104 01/16/2015 1000   CO2 24 08/22/2015 0951   CO2 30 01/16/2015 1000   BUN 17.6 08/22/2015 0951   BUN 19 01/16/2015 1000   CREATININE 0.7 08/22/2015 0951   CREATININE 0.71 01/16/2015 1000   CREATININE 0.73 03/05/2014 1044      Component Value Date/Time   CALCIUM 9.8 08/22/2015 0951   CALCIUM 10.1 01/16/2015  1000   ALKPHOS 93 08/22/2015 0951   ALKPHOS 86 01/16/2015 1000   AST 13 08/22/2015 0951   AST 15 01/16/2015 1000   ALT 11 08/22/2015 0951   ALT 12 01/16/2015 1000   BILITOT 0.30 08/22/2015 0951   BILITOT 0.4 01/16/2015 1000       Lab Results  Component Value Date   WBC 4.5 08/22/2015   HGB 12.3 08/22/2015   HCT 37.9 08/22/2015   MCV 89.5 08/22/2015   PLT 316 08/22/2015   NEUTROABS 2.6 08/22/2015     ASSESSMENT & PLAN:  Breast cancer of upper-outer quadrant of left female breast Metastatic breast cancer in the left breast involving left axillary lymph nodes and isolated L1 vertebral metastases stage IV disease, ER 100 percent, PR 4%, HER-2 negative  ratio 1.3 Ki-67 15%  Chemotherapy summary: Completed dose dense Adriamycin Cytoxan 4 started 07/26/2014. Completed 12 weeks of Taxol by 12/18/2014. Left lumpectomy 01/30/2015: Invasive ductal carcinoma grade 2, 1.2 cm, 12/15 lymph nodes positive, extracapsular tumor extension present, T1c N3M1 stage IV Status post radiation completed 04/24/2015  Current treatment: Anastrozole since July 2016  Anastrozole Toxicities: 1. Intermittent hot flashes 2. Arthritis in the knees 3. Skin sensitivity   Chest tightness, fatigue, dizziness, weakness, throbbing sensation: Patient is very concerned of cancer recurrence. I would like to obtain a CT of her chest abdomen and pelvis and MRI brain and follow-up.  Return to clinic after scans   Orders Placed This Encounter  Procedures  . CT Abdomen Pelvis W Contrast    Standing Status: Future     Number of Occurrences:      Standing Expiration Date: 11/10/2016    Order Specific Question:  If indicated for the ordered procedure, I authorize the administration of contrast media per Radiology protocol    Answer:  Yes    Order Specific Question:  Reason for Exam (SYMPTOM  OR DIAGNOSIS REQUIRED)    Answer:  dizziness with H/O breast cancer    Order Specific Question:  Preferred imaging location?    Answer:  [facility]  . CT Chest W Contrast    Standing Status: Future     Number of Occurrences:      Standing Expiration Date: 11/10/2016    Order Specific Question:  If indicated for the ordered procedure, I authorize the administration of contrast media per Radiology protocol    Answer:  Yes    Order Specific Question:  Reason for Exam (SYMPTOM  OR DIAGNOSIS REQUIRED)    Answer:  dizziness with H/O breast cancer    Order Specific Question:  Preferred imaging location?    Answer:  [facility]  . MR Brain W Wo Contrast    Standing Status: Future     Number of Occurrences:      Standing Expiration Date: 11/10/2016    Order Specific Question:  If  indicated for the ordered procedure, I authorize the administration of contrast media per Radiology protocol    Answer:  Yes    Order Specific Question:  Reason for Exam (SYMPTOM  OR DIAGNOSIS REQUIRED)    Answer:  dizziness with H/O breast cancer    Order Specific Question:  Preferred imaging location?    Answer:  [facility]    Order Specific Question:  Does the patient have a pacemaker or implanted devices?    Answer:  No    Order Specific Question:  What is the patient's sedation requirement?    Answer:  No Sedation  The patient has a good understanding of the overall plan. she agrees with it. she will call with any problems that may develop before the next visit here.   Rulon Eisenmenger, MD 11/11/2015

## 2015-11-11 NOTE — Assessment & Plan Note (Signed)
Metastatic breast cancer in the left breast involving left axillary lymph nodes and isolated L1 vertebral metastases stage IV disease, ER 100 percent, PR 4%, HER-2 negative ratio 1.3 Ki-67 15%  Chemotherapy summary: Completed dose dense Adriamycin Cytoxan 4 started 07/26/2014. Completed 12 weeks of Taxol by 12/18/2014. Left lumpectomy 01/30/2015: Invasive ductal carcinoma grade 2, 1.2 cm, 12/15 lymph nodes positive, extracapsular tumor extension present, T1c N3M1 stage IV Status post radiation completed 04/24/2015  Current treatment: Anastrozole since July 2016  Anastrozole Toxicities: Denies any hot flashes or myalgias or fatigue.  Chest tightness, fatigue, dizziness, weakness, throbbing sensation: Patient is very concerned of cancer recurrence. I would like to obtain a CT of her chest abdomen and pelvis and bone scan and follow-up.  Return to clinic after scans

## 2015-11-12 ENCOUNTER — Telehealth: Payer: Self-pay | Admitting: Hematology and Oncology

## 2015-11-12 ENCOUNTER — Other Ambulatory Visit: Payer: Self-pay | Admitting: *Deleted

## 2015-11-12 ENCOUNTER — Telehealth: Payer: Self-pay | Admitting: *Deleted

## 2015-11-12 NOTE — Telephone Encounter (Signed)
Pt wants to let Dr Lindi Adie know that she does not think she needs the scan of her "head" done.. Wants to proceed with other scans

## 2015-11-12 NOTE — Telephone Encounter (Signed)
Left message for patient re f/u for 1/31 and mailed schedule. Central will call re ct - patient informed in vm and provided information to f/u w/central re specific date/time request due to days off per 1/16 pof.

## 2015-11-12 NOTE — Telephone Encounter (Signed)
Order cancelled and Benedetto Goad notified.

## 2015-11-13 ENCOUNTER — Telehealth: Payer: Self-pay | Admitting: Hematology and Oncology

## 2015-11-13 NOTE — Telephone Encounter (Signed)
Patient called in today at 12:21 and left a message to cancel her 1/31 appointment and i called and confirmed through her vm that it had been cancelled as this was her req

## 2015-11-20 ENCOUNTER — Ambulatory Visit: Payer: Managed Care, Other (non HMO) | Admitting: Hematology and Oncology

## 2015-11-26 ENCOUNTER — Ambulatory Visit: Payer: Managed Care, Other (non HMO) | Admitting: Hematology and Oncology

## 2015-12-18 ENCOUNTER — Telehealth: Payer: Self-pay | Admitting: Internal Medicine

## 2015-12-18 MED ORDER — LEVOTHYROXINE SODIUM 112 MCG PO TABS: 112.0000 ug | ORAL_TABLET | Freq: Every day | ORAL | Status: DC

## 2015-12-18 NOTE — Telephone Encounter (Signed)
lmovm to call and schedule an appt  °

## 2015-12-18 NOTE — Telephone Encounter (Signed)
Pt is past due for lab work and follow up.  Please get her on the schedule for both appointments.  Based on last results her levothyroxine may need to be adjusted.   Will send in a 30 day supply of medication

## 2015-12-18 NOTE — Telephone Encounter (Signed)
Pt request refill of the following: levothyroxine (SYNTHROID, LEVOTHROID) 112 MCG tablet  Pt has a new pharmacy that she needs updated   Phamacy: [facility]

## 2015-12-19 ENCOUNTER — Other Ambulatory Visit (INDEPENDENT_AMBULATORY_CARE_PROVIDER_SITE_OTHER): Payer: Managed Care, Other (non HMO)

## 2015-12-19 DIAGNOSIS — I519 Heart disease, unspecified: Principal | ICD-10-CM

## 2015-12-19 DIAGNOSIS — E039 Hypothyroidism, unspecified: Secondary | ICD-10-CM | POA: Diagnosis not present

## 2015-12-19 LAB — TSH: TSH: 3.2 u[IU]/mL (ref 0.35–4.50)

## 2015-12-19 NOTE — Telephone Encounter (Signed)
Pt is coming in today for lab work. Pt does not know when she call schedule follow up appt

## 2015-12-30 NOTE — Progress Notes (Signed)
Chief Complaint  Patient presents with  . Follow-up    sick today  "cancer is back"  . Hypothyroidism    HPI: Kimberly Reed  60 y.o. comes   Fu thyroid replacement therapy and follow-up lab. However she states she's not feeling well today. Has just started taking Xanax 1 mg at night as needed for sleep and anxiety given to her by her oncologist because she states that her cancer has come back and is to get a scan in 3 days for more evaluation. She reports that this kind of medicine might help her sleep but it causes nausea and vomiting and she is really sensitive to this. She states she vomited into the trash can while waiting to be seen. There is no fever or severe abdominal pain. ROS: See pertinent positives and negatives per HPI.  Past Medical History  Diagnosis Date  . Hypothyroid   . Glaucoma   . Hyperlipemia   . Migraine without aura   . History of syncope 2008    loss of bladder control eval by Neuro  . Atypical chest pain   . Family history of heart disease   . Anxiety   . Depression   . Hot flashes   . Anemia   . Insomnia   . Cancer ([place])     left breast dx. [id number] chemo planned to start 07-09-14- Dr. Lindi Adie[facility]  . Wears glasses   . Breast cancer ([place]) 05/2014    left  . Glaucoma     Family History  Problem Relation Age of Onset  . Aneurysm Mother   . Kidney disease Mother   . Hypertension Father   . Dementia Father   . Kidney disease Brother   . Heart disease      "all of moms side"  . Heart failure Cousin   . Depression      Social History   Social History  . Marital Status: Single    Spouse Name: N/A  . Number of Children: N/A  . Years of Education: N/A   Occupational History  . 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 Smoking status: Never Smoker   . Smokeless tobacco: Never Used  . Alcohol Use: No  . Drug Use: No     Comment: in 20's but not now  . Sexual Activity:    Partners: Female   Comment: menarche age 30, P 0, no HRT   Other Topics Concern  . Not on file   Social History Narrative   Social :  [place]      For over a year.     Hh of 1  2 cats      No tad    8 hours of sleep.       On  disabilirty for  Breast cancer .   Work now  59- 40 per week              EXAM:  BP 144/90 mmHg  Temp(Src) 98.2 F (36.8 C) (Oral)  Wt 248 lb 14.4 oz (112.9 kg)  LMP 02/15/2013  Body mass index is 41.42 kg/(m^2).  GENERAL: vitals reviewed and listed above, alert,, appears  in no acute distress however having 3 episodes of vomiting throughout the visit. It is not bilious nor projectile. But she is uncomfortable. HEENT: atraumatic, conjunctiva  clear, no obvious abnormalities on inspection of external nose and ears NECK: no obvious masses  on inspection palpation  LUNGS: clear to auscultation bilaterally, no wheezes, rales or rhonchi,  CV: HRRR, no clubbing cyanosis or nl cap refill  MS: moves all extremities without noticeable focal  abnormality PSYCH: p cooperative appearing somewhat sad and distressed normal speech. Exam is grossly focal. Lab Results  Component Value Date   WBC 4.5 08/22/2015   HGB 12.3 08/22/2015   HCT 37.9 08/22/2015   PLT 316 08/22/2015   GLUCOSE 91 08/22/2015   CHOL 248* 01/16/2015   TRIG 96.0 01/16/2015   HDL 53.10 01/16/2015   LDLDIRECT 167.3 03/16/2011   LDLCALC 176* 01/16/2015   ALT 11 08/22/2015   AST 13 08/22/2015   NA 138 08/22/2015   K 4.1 08/22/2015   CL 104 01/16/2015   CREATININE 0.7 08/22/2015   BUN 17.6 08/22/2015   CO2 24 08/22/2015   TSH 3.20 12/19/2015   INR 1.02 07/19/2014   BP Readings from Last 3 Encounters:  12/31/15 144/90  11/11/15 118/76  09/06/15 120/80   Wt Readings from Last 3 Encounters:  12/31/15 248 lb 14.4 oz (112.9 kg)  11/11/15 256 lb 12.8 oz (116.484 kg)  09/06/15 256 lb 4.8 oz (116.257 kg)      ASSESSMENT AND PLAN:  Discussed the following assessment and  plan:  Hypothyroidism, unspecified hypothyroidism type - Maintain same dose refill sent into pharmacy  Nausea and vomiting, intractability of vomiting not specified, unspecified vomiting type - vomited x 4 in office today  no acute pain pt staes its from medication. non toxic  onexam  Medication management - risk benefit of meds explaioned  Breast cancer of upper-outer quadrant of left female breast ([place])  Insomnia due to stress - Patient has for other options for sleep because alprazolam causes her to feel nausea and vomiting the next day. Small amount Ambien info to oncology  To not take ambien with alprazolam pt aware -Patient advised to return or notify health care team  if symptoms worsen ,persist or new concerns arise.  Patient Instructions  Stay on same dose of thyroid medication   Will send in 90 day refill. Can try ambien for sleep    May or may not work . But may help .  Will send note to your oncologist .   If?  If antinausea medication will help.  ROV in november or as needed  In the interim .    Standley Brooking. Delbert Darley M.D.

## 2015-12-31 ENCOUNTER — Ambulatory Visit (INDEPENDENT_AMBULATORY_CARE_PROVIDER_SITE_OTHER): Payer: Managed Care, Other (non HMO) | Admitting: Internal Medicine

## 2015-12-31 ENCOUNTER — Telehealth: Payer: Self-pay | Admitting: *Deleted

## 2015-12-31 ENCOUNTER — Encounter: Payer: Self-pay | Admitting: Internal Medicine

## 2015-12-31 ENCOUNTER — Other Ambulatory Visit: Payer: Self-pay | Admitting: *Deleted

## 2015-12-31 VITALS — BP 144/90 | Temp 98.2°F | Wt 248.9 lb

## 2015-12-31 DIAGNOSIS — F5102 Adjustment insomnia: Secondary | ICD-10-CM

## 2015-12-31 DIAGNOSIS — R112 Nausea with vomiting, unspecified: Secondary | ICD-10-CM

## 2015-12-31 DIAGNOSIS — Z79899 Other long term (current) drug therapy: Secondary | ICD-10-CM

## 2015-12-31 DIAGNOSIS — E039 Hypothyroidism, unspecified: Secondary | ICD-10-CM

## 2015-12-31 DIAGNOSIS — C50412 Malignant neoplasm of upper-outer quadrant of left female breast: Secondary | ICD-10-CM | POA: Diagnosis not present

## 2015-12-31 MED ORDER — ZOLPIDEM TARTRATE 5 MG PO TABS: 5.0000 mg | ORAL_TABLET | Freq: Every evening | ORAL | Status: DC | PRN

## 2015-12-31 MED ORDER — LEVOTHYROXINE SODIUM 112 MCG PO TABS: 112.0000 ug | ORAL_TABLET | Freq: Every day | ORAL | Status: DC

## 2015-12-31 MED ORDER — PROMETHAZINE HCL 25 MG PO TABS: 25.0000 mg | ORAL_TABLET | Freq: Four times a day (QID) | ORAL | Status: DC | PRN

## 2015-12-31 NOTE — Telephone Encounter (Signed)
FYI Patient called reporting she never received call for CT scans and f/u is to be after scans."   Provided Central Scheduling number for patient to call for CT Abdomen, Chest per Dr. Geralyn Flash orders 11-11-2015.  Asked that she call with scan appointment information to have F/u at [facility] scheduled.

## 2015-12-31 NOTE — Telephone Encounter (Signed)
Returned call and went to voicemail. Left VMM to return call. Did send pof to schedule f/u after CT done on 3/10.

## 2015-12-31 NOTE — Telephone Encounter (Signed)
CT scheduled for 3/10, sent pof to schedule f/u after that.

## 2015-12-31 NOTE — Patient Instructions (Addendum)
Stay on same dose of thyroid medication   Will send in 90 day refill. Can try ambien for sleep    May or may not work . But may help .  Will send note to your oncologist .   If?  If antinausea medication will help.  ROV in november or as needed  In the interim .

## 2016-01-01 ENCOUNTER — Telehealth: Payer: Self-pay | Admitting: Hematology and Oncology

## 2016-01-01 NOTE — Telephone Encounter (Signed)
lvm for pt regarding to march appt... °

## 2016-01-02 ENCOUNTER — Telehealth: Payer: Self-pay | Admitting: Hematology and Oncology

## 2016-01-02 ENCOUNTER — Other Ambulatory Visit: Payer: Self-pay

## 2016-01-02 DIAGNOSIS — C50412 Malignant neoplasm of upper-outer quadrant of left female breast: Secondary | ICD-10-CM

## 2016-01-02 NOTE — Telephone Encounter (Signed)
Patient called today to r/s 3/16 f/u to 3/21. Also patient has scheduled ct scan for 3/17 and cannot come for lab 3/10 @ 4 pm. Per patient she also cannot come for lab 3/17 prior to ct. Patient informed per instruction/pof sent by provider lab is needed prior to proceeding with ct. Per patient she will need to think about how she can schedule lab and when she can come in for lab prior to ct. Per patient she will call me back regarding rescheduling 3/10 lab.

## 2016-01-03 ENCOUNTER — Ambulatory Visit (HOSPITAL_COMMUNITY): Admission: RE | Admit: 2016-01-03 | Payer: Managed Care, Other (non HMO) | Source: Ambulatory Visit

## 2016-01-03 ENCOUNTER — Telehealth: Payer: Self-pay | Admitting: Hematology and Oncology

## 2016-01-03 ENCOUNTER — Other Ambulatory Visit: Payer: Managed Care, Other (non HMO)

## 2016-01-03 NOTE — Telephone Encounter (Signed)
Patient called and r/s 3/10 lab to 3/17 @ 4 pm. Patient has new date/time.

## 2016-01-06 ENCOUNTER — Telehealth: Payer: Self-pay | Admitting: *Deleted

## 2016-01-06 NOTE — Telephone Encounter (Signed)
Patient left message to move up her f/u. Called and left message that patient needs to have CT done before f/u to determine if treatment needs to be done/changed.

## 2016-01-09 ENCOUNTER — Ambulatory Visit: Payer: Managed Care, Other (non HMO) | Admitting: Nurse Practitioner

## 2016-01-10 ENCOUNTER — Other Ambulatory Visit (HOSPITAL_BASED_OUTPATIENT_CLINIC_OR_DEPARTMENT_OTHER): Payer: Managed Care, Other (non HMO)

## 2016-01-10 ENCOUNTER — Ambulatory Visit (HOSPITAL_COMMUNITY)
Admission: RE | Admit: 2016-01-10 | Discharge: 2016-01-10 | Disposition: A | Discharge status: Home / Self Care | Payer: Managed Care, Other (non HMO) | Source: Ambulatory Visit | Attending: Hematology and Oncology | Admitting: Hematology and Oncology

## 2016-01-10 ENCOUNTER — Emergency Department (HOSPITAL_COMMUNITY)
Admission: EM | Admit: 2016-01-10 | Discharge: 2016-01-10 | Disposition: A | Discharge status: Home / Self Care | Payer: Managed Care, Other (non HMO)

## 2016-01-10 DIAGNOSIS — C50412 Malignant neoplasm of upper-outer quadrant of left female breast: Secondary | ICD-10-CM | POA: Diagnosis not present

## 2016-01-10 DIAGNOSIS — N200 Calculus of kidney: Secondary | ICD-10-CM | POA: Diagnosis not present

## 2016-01-10 LAB — COMPREHENSIVE METABOLIC PANEL
ALT: 17 U/L (ref 0–55)
ANION GAP: 8 meq/L (ref 3–11)
AST: 15 U/L (ref 5–34)
Albumin: 4.1 g/dL (ref 3.5–5.0)
Alkaline Phosphatase: 92 U/L (ref 40–150)
BUN: 18.1 mg/dL (ref 7.0–26.0)
CALCIUM: 10.1 mg/dL (ref 8.4–10.4)
CO2: 29 mEq/L (ref 22–29)
CREATININE: 1 mg/dL (ref 0.6–1.1)
Chloride: 105 mEq/L (ref 98–109)
EGFR: 63 mL/min/{1.73_m2} — ABNORMAL LOW (ref >90)
Glucose: 115 mg/dl (ref 70–140)
Potassium: 3.8 mEq/L (ref 3.5–5.1)
Sodium: 141 mEq/L (ref 136–145)
TOTAL PROTEIN: 7.4 g/dL (ref 6.4–8.3)
Total Bilirubin: 0.5 mg/dL (ref 0.20–1.20)

## 2016-01-10 LAB — CBC WITH DIFFERENTIAL/PLATELET
BASO%: 0.4 % (ref 0.0–2.0)
Basophils Absolute: 0 10*3/uL (ref 0.0–0.1)
EOS%: 2.7 % (ref 0.0–7.0)
Eosinophils Absolute: 0.1 10*3/uL (ref 0.0–0.5)
HEMATOCRIT: 38.9 % (ref 34.8–46.6)
HGB: 12.9 g/dL (ref 11.6–15.9)
LYMPH%: 35.6 % (ref 14.0–49.7)
MCH: 30.1 pg (ref 25.1–34.0)
MCHC: 33.2 g/dL (ref 31.5–36.0)
MCV: 90.9 fL (ref 79.5–101.0)
MONO#: 0.5 10*3/uL (ref 0.1–0.9)
MONO%: 11.4 % (ref 0.0–14.0)
NEUT%: 49.9 % (ref 38.4–76.8)
NEUTROS ABS: 2.4 10*3/uL (ref 1.5–6.5)
PLATELETS: 285 10*3/uL (ref 145–400)
RBC: 4.28 10*6/uL (ref 3.70–5.45)
RDW: 15.1 % — ABNORMAL HIGH (ref 11.2–14.5)
WBC: 4.8 10*3/uL (ref 3.9–10.3)
lymph#: 1.7 10*3/uL (ref 0.9–3.3)

## 2016-01-10 MED ORDER — IOHEXOL 300 MG/ML  SOLN
100.0000 mL | Freq: Once | INTRAMUSCULAR | Status: AC | PRN
  Administered 2016-01-10: 100 mL via INTRAVENOUS

## 2016-01-14 ENCOUNTER — Encounter: Payer: Self-pay | Admitting: Hematology and Oncology

## 2016-01-14 ENCOUNTER — Ambulatory Visit (HOSPITAL_BASED_OUTPATIENT_CLINIC_OR_DEPARTMENT_OTHER): Payer: Managed Care, Other (non HMO) | Admitting: Hematology and Oncology

## 2016-01-14 ENCOUNTER — Telehealth: Payer: Self-pay | Admitting: Hematology and Oncology

## 2016-01-14 VITALS — BP 128/78 | HR 115 | Temp 98.8°F | Resp 20 | Wt 247.3 lb

## 2016-01-14 DIAGNOSIS — C50412 Malignant neoplasm of upper-outer quadrant of left female breast: Secondary | ICD-10-CM | POA: Diagnosis not present

## 2016-01-14 MED ORDER — ALPRAZOLAM 1 MG PO TABS: 1.0000 mg | ORAL_TABLET | Freq: Every evening | ORAL | Status: DC | PRN

## 2016-01-14 MED ORDER — ANASTROZOLE 1 MG PO TABS: 1.0000 mg | ORAL_TABLET | Freq: Every day | ORAL | Status: DC

## 2016-01-14 NOTE — Assessment & Plan Note (Signed)
Metastatic breast cancer in the left breast involving left axillary lymph nodes and isolated L1 vertebral metastases stage IV disease, ER 100 percent, PR 4%, HER-2 negative ratio 1.3 Ki-67 15%  Chemotherapy summary: Completed dose dense Adriamycin Cytoxan 4 started 07/26/2014. Completed 12 weeks of Taxol by 12/18/2014. Left lumpectomy 01/30/2015: Invasive ductal carcinoma grade 2, 1.2 cm, 12/15 lymph nodes positive, extracapsular tumor extension present, T1c N3M1 stage IV Status post radiation completed 04/24/2015  Current treatment: Anastrozole since July 2016  Anastrozole Toxicities: 1. Intermittent hot flashes 2. Arthritis in the knees 3. Skin sensitivity   Chest tightness, fatigue, dizziness, weakness, throbbing sensation: Patient is very concerned of cancer recurrence.  CT CAP 01/10/16: No evidence of metastatic disease, 3 mm nonobstructing left renal calculus I reassured the patient and based upon the CT scans we do not see any evidence of metastatic disease.  Return to clinic in 6 months for follow-up on antiestrogen therapy.

## 2016-01-14 NOTE — Telephone Encounter (Signed)
appt made and avs printed. CT to be scheduled with central radiology. mammo to be scheduled but no order yet

## 2016-01-14 NOTE — Progress Notes (Signed)
Patient Care Team: Burnis Medin, MD as PCP - General (Internal Medicine) Nicholas Lose, MD as Consulting Physician (Hematology and Oncology) [name], MD as Consulting Physician (General Surgery) Eppie Gibson, MD as Attending Physician (Radiation Oncology)  DIAGNOSIS: Breast cancer of upper-outer quadrant of left female breast [facility])   Staging form: Breast, AJCC 7th Edition     Clinical: Stage IV (T2, N1, M1) - Signed by Rulon Eisenmenger, MD on 08/16/2014       Staging comments: Staged at breast conference 06/06/14.      Pathologic stage from 02/01/2015: Stage IV (yT1c, N3, M1) - Signed by Enid Cutter, MD on 03/07/2015       Staging comments: Staged on final lumpectomy specimen by Dr. Donato Heinz.    SUMMARY OF ONCOLOGIC HISTORY:   Breast cancer of upper-outer quadrant of left female breast ([place])   05/18/2014 Mammogram Suspicious left upper outer quadrant 5 cm segmental area of calcifications.    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   05/30/2014 Breast MRI Left Breast: 12oclock: 21 x 23 x 30 mm, Numerous  level 1 and 2 LN; Liver lesions cycts by Liver MRI   06/07/2014 Initial Biopsy Left axillary lymph node biopsy invasive ductal carcinoma ER 100%, PR 11% Ki-67 15% HER-2 negative ratio 1.41   06/13/2014 PET scan left breast activity, hypermetabolic left axillary and left retropectoral lymph node, small lucent lesion in L1 vertebra which was biopsied and proven to be bone metastases   07/19/2014 Initial Biopsy Biopsy of L1 vertebra metastatic carcinoma breast primary, ER 100%, PR 4%, HER-2 negative ratio 1.3   07/26/2014 - 12/18/2014 Neo-Adjuvant Chemotherapy Even though she has L1 solitary bone metastases, patient is being treated definitively with neoadjuvant chemotherapy with dose dense Adriamycin and Cytoxan to be followed by weekly Taxol x12   12/25/2014 PET scan Interval improvement in size and  metabolic activity associated with left axillary lymph nodes consistent with treatment response, no hypermetabolic distant metastases, L1 vertebral body has no activity   12/25/2014 Breast MRI No residual enhancement left breast, decreased left axillary lymphadenopathy, indicating response to treatment but lymph nodes are still present   01/30/2015 Surgery Left breast lumpectomy: Invasive ductal carcinoma grade 2, 1.2 cm, 12/15 lymph nodes positive, extracapsular tumor extension present, T1c N3M1 stage IV    03/12/2015 - 04/24/2015 Radiation Therapy  adjuvant radiation therapy with Dr. Isidore Moos   05/16/2015 -  Anti-estrogen oral therapy Anastrozole 1 mg daily 10 years is the plan; [name] was recommended but she was not reachable and hence it was not filled   01/10/2016 Imaging CT chest abdomen pelvis: No evidence of metastatic disease    CHIEF COMPLIANT: Dizziness, confusion, generalized aches and pains.  INTERVAL HISTORY: Kimberly Reed is a 60 year old with above-mentioned history metastatic breast cancer who was treated with definitive therapy with neoadjuvant chemotherapy followed by lumpectomy and radiation. She is currently on oral anti-estrogen therapy with anastrozole. Last visit she complained that she felt that the whole body is revealed with cancer. We obtain a CT chest abdomen pelvis which was normal. She also was complaining of dizziness and confusion. Previously I ordered a CT of the head but she called and canceled the CT scan. Today she is requesting that we set her up for another CT scan of the brain.  REVIEW OF SYSTEMS:   Constitutional: Denies fevers, chills or abnormal weight loss Eyes: Denies blurriness of vision Ears, nose, mouth, throat, and  face: Denies mucositis or sore throat Respiratory: Denies cough, dyspnea or wheezes Cardiovascular: Denies palpitation, chest discomfort Gastrointestinal:  Denies nausea, heartburn or change in bowel habits Skin: Denies abnormal skin  rashes Lymphatics: Denies new lymphadenopathy or easy bruising Neurological:Denies numbness, tingling or new weaknesses Behavioral/Psych: Mood is depressed  Extremities: No lower extremity edema Breast: Left breast firmness related to prior radiation and surgery All other systems were reviewed with the patient and are negative.  I have reviewed the past medical history, past surgical history, social history and family history with the patient and they are unchanged from previous note.  ALLERGIES:  is allergic to other and effexor.  MEDICATIONS:  Current Outpatient Prescriptions  Medication Sig Dispense Refill  . ALPRAZolam (XANAX) 1 MG tablet Take 1 tablet (1 mg total) by mouth at bedtime as needed for anxiety. 30 tablet 0  . anastrozole (ARIMIDEX) 1 MG tablet Take 1 tablet (1 mg total) by mouth daily. 90 tablet 3  . brimonidine-timolol (COMBIGAN) 0.2-0.5 % ophthalmic solution Place 1 drop into the right eye every 12 (twelve) hours.     . dorzolamide (TRUSOPT) 2 % ophthalmic solution Place 1 drop into the right eye 2 (two) times daily.     [name] levothyroxine (SYNTHROID, LEVOTHROID) 112 MCG tablet Take 1 tablet (112 mcg total) by mouth daily. 90 tablet 2  . non-metallic deodorant (ALRA) MISC Apply 1 application topically daily as needed.    [name] OVER THE COUNTER MEDICATION Take 1 capsule by mouth daily.    . promethazine (PHENERGAN) 25 MG tablet Take 1 tablet (25 mg total) by mouth every 6 (six) hours as needed for nausea or vomiting. 14 tablet 0  . Travoprost, BAK Free, (TRAVATAN) 0.004 % SOLN ophthalmic solution Place 1 drop into the right eye at bedtime.     [name] zolpidem (AMBIEN) 5 MG tablet Take 1 tablet (5 mg total) by mouth at bedtime as needed for sleep. 15 tablet 0   No current facility-administered medications for this visit.   Facility-Administered Medications Ordered in Other Visits  Medication Dose Route Frequency Provider Last Rate Last Dose  . sodium chloride 0.9 % injection 10 mL   10 mL Intracatheter PRN Nicholas Lose, MD   10 mL at 10/12/14 1359  . sodium chloride 0.9 % injection 10 mL  10 mL Intracatheter PRN Nicholas Lose, MD   10 mL at 12/11/14 1653    PHYSICAL EXAMINATION: ECOG PERFORMANCE STATUS: 1 - Symptomatic but completely ambulatory  Filed Vitals:   01/14/16 1333  BP: 128/78  Pulse: 115  Temp: 98.8 F (37.1 C)  Resp: 20   Filed Weights   01/14/16 1333  Weight: 247 lb 4.8 oz (112.175 kg)    GENERAL:alert, no distress and comfortable SKIN: skin color, texture, turgor are normal, no rashes or significant lesions EYES: normal, Conjunctiva are pink and non-injected, sclera clear OROPHARYNX:no exudate, no erythema and lips, buccal mucosa, and tongue normal  NECK: supple, thyroid normal size, non-tender, without nodularity LYMPH:  no palpable lymphadenopathy in the cervical, axillary or inguinal LUNGS: clear to auscultation and percussion with normal breathing effort HEART: regular rate & rhythm and no murmurs and no lower extremity edema ABDOMEN:abdomen soft, non-tender and normal bowel sounds MUSCULOSKELETAL:no cyanosis of digits and no clubbing  NEURO: alert & oriented x 3 with fluent speech, no focal motor/sensory deficits EXTREMITIES: No lower extremity edema  LABORATORY DATA:  I have reviewed the data as listed   Chemistry      Component Value Date/Time  NA 141 01/10/2016 1539   NA 140 01/16/2015 1000   K 3.8 01/10/2016 1539   K 4.9 01/16/2015 1000   CL 104 01/16/2015 1000   CO2 29 01/10/2016 1539   CO2 30 01/16/2015 1000   BUN 18.1 01/10/2016 1539   BUN 19 01/16/2015 1000   CREATININE 1.0 01/10/2016 1539   CREATININE 0.71 01/16/2015 1000   CREATININE 0.73 03/05/2014 1044      Component Value Date/Time   CALCIUM 10.1 01/10/2016 1539   CALCIUM 10.1 01/16/2015 1000   ALKPHOS 92 01/10/2016 1539   ALKPHOS 86 01/16/2015 1000   AST 15 01/10/2016 1539   AST 15 01/16/2015 1000   ALT 17 01/10/2016 1539   ALT 12 01/16/2015 1000    BILITOT 0.50 01/10/2016 1539   BILITOT 0.4 01/16/2015 1000       Lab Results  Component Value Date   WBC 4.8 01/10/2016   HGB 12.9 01/10/2016   HCT 38.9 01/10/2016   MCV 90.9 01/10/2016   PLT 285 01/10/2016   NEUTROABS 2.4 01/10/2016     ASSESSMENT & PLAN:  Breast cancer of upper-outer quadrant of left female breast ([place]) Metastatic breast cancer in the left breast involving left axillary lymph nodes and isolated L1 vertebral metastases stage IV disease, ER 100 percent, PR 4%, HER-2 negative ratio 1.3 Ki-67 15%  Chemotherapy summary: Completed dose dense Adriamycin Cytoxan 4 started 07/26/2014. Completed 12 weeks of Taxol by 12/18/2014. Left lumpectomy 01/30/2015: Invasive ductal carcinoma grade 2, 1.2 cm, 12/15 lymph nodes positive, extracapsular tumor extension present, T1c N3M1 stage IV Status post radiation completed 04/24/2015  Current treatment: Anastrozole since July 2016  Anastrozole Toxicities: 1. Intermittent hot flashes 2. Arthritis in the knees 3. Skin sensitivity   Chest tightness, fatigue, dizziness, weakness, throbbing sensation: Patient is very concerned of cancer recurrence.  CT CAP 01/10/16: No evidence of metastatic disease, 3 mm nonobstructing left renal calculus I reassured the patient and based upon the CT scans we do not see any evidence of metastatic disease.  Dizziness and confusion with short-term memory loss: I previously scheduled her for CT of the head but she called and canceled it previously. Now she would like to undergo the CT scan. I will set her up. Breast cancer surveillance: Patient has not had a mammogram. I will set her up for a 3-D mammogram. The mammogram on the CT of the head are normal, then I can see her back in 6 months for follow-up.  Return to clinic in 6 months for follow-up on antiestrogen therapy.    Orders Placed This Encounter  Procedures  . CT Head W Wo Contrast    Standing Status: Future     Number of Occurrences:       Standing Expiration Date: 01/13/2017    Order Specific Question:  If indicated for the ordered procedure, I authorize the administration of contrast media per Radiology protocol    Answer:  Yes    Order Specific Question:  Reason for Exam (SYMPTOM  OR DIAGNOSIS REQUIRED)    Answer:  Dizziness, confusion memory loss with H/O breast cancer    Order Specific Question:  Preferred imaging location?    Answer:  [facility]   The patient has a good understanding of the overall plan. she agrees with it. she will call with any problems that may develop before the next visit here.   Rulon Eisenmenger, MD 01/14/2016

## 2016-01-16 ENCOUNTER — Telehealth: Payer: Self-pay | Admitting: Internal Medicine

## 2016-01-16 ENCOUNTER — Encounter: Payer: Self-pay | Admitting: Family Medicine

## 2016-01-16 NOTE — Telephone Encounter (Signed)
Results were given on 12/31/15 at ov.  Mailed results to the pt along with letter explaining test was normal and to continue same dose of levothyroxine.

## 2016-01-16 NOTE — Telephone Encounter (Signed)
Pt states she was calling to find the results of her labs on 2/20. Pt states she did not receive her results at the visit 3/07.  Pt works in Therap[employer], [employer] and will be available between 2:45 and 3pm

## 2016-01-24 ENCOUNTER — Telehealth: Payer: Self-pay

## 2016-01-24 NOTE — Telephone Encounter (Signed)
LMOVM - Provided pt with date, time and location for CT and mammogram.  Also provided number for central scheduling and [facility] if pt needs to reschedule anything.  Pt to call clinic if she has any questions.

## 2016-01-27 ENCOUNTER — Ambulatory Visit (HOSPITAL_COMMUNITY): Payer: Managed Care, Other (non HMO)

## 2016-01-28 ENCOUNTER — Encounter: Payer: Self-pay | Admitting: Hematology and Oncology

## 2016-01-28 ENCOUNTER — Telehealth: Payer: Self-pay

## 2016-01-28 NOTE — Telephone Encounter (Signed)
Pt left msg - all of her symptoms have resolved since stopping the arimidex; she wants to know next step? Per Dr. Lindi Adie - pt needs to be seen in one month.  Pt was unable to make an appt at this time.  Pt reports she will call back to clinic to make an appt.

## 2016-01-28 NOTE — Progress Notes (Signed)
Fax sent 08/30/15 I sent to medical records

## 2016-01-30 ENCOUNTER — Other Ambulatory Visit: Payer: Self-pay

## 2016-01-30 ENCOUNTER — Inpatient Hospital Stay: Admission: RE | Admit: 2016-01-30 | Payer: Managed Care, Other (non HMO) | Source: Ambulatory Visit

## 2016-01-31 ENCOUNTER — Telehealth: Payer: Self-pay | Admitting: Hematology and Oncology

## 2016-01-31 NOTE — Telephone Encounter (Signed)
line was busy.. calendar mailed to pt

## 2016-02-19 ENCOUNTER — Telehealth: Payer: Self-pay | Admitting: Hematology and Oncology

## 2016-02-19 NOTE — Telephone Encounter (Signed)
returned call and lvm to call back with how far trying to move appt

## 2016-02-21 ENCOUNTER — Telehealth: Payer: Self-pay | Admitting: Hematology and Oncology

## 2016-02-21 NOTE — Telephone Encounter (Signed)
lvm for pt to return call to office to r/s 5/18 appt

## 2016-02-24 ENCOUNTER — Ambulatory Visit: Payer: Managed Care, Other (non HMO) | Admitting: Hematology and Oncology

## 2016-02-24 ENCOUNTER — Telehealth: Payer: Self-pay | Admitting: Hematology and Oncology

## 2016-02-24 NOTE — Telephone Encounter (Signed)
returned call and lvm for pt with appt we have available...she needs after noon we only on 5.16m 5.10 or 5.18 in the afternoon

## 2016-02-26 ENCOUNTER — Ambulatory Visit: Payer: Managed Care, Other (non HMO) | Admitting: Hematology and Oncology

## 2016-02-27 ENCOUNTER — Ambulatory Visit: Payer: Managed Care, Other (non HMO) | Admitting: Hematology and Oncology

## 2016-03-11 ENCOUNTER — Telehealth: Payer: Self-pay | Admitting: Hematology and Oncology

## 2016-03-11 NOTE — Telephone Encounter (Signed)
pt called to r/s appt...done....pt ok and aware of new d.t °

## 2016-03-12 ENCOUNTER — Ambulatory Visit: Payer: Managed Care, Other (non HMO) | Admitting: Hematology and Oncology

## 2016-03-17 ENCOUNTER — Ambulatory Visit: Payer: Managed Care, Other (non HMO) | Admitting: Hematology and Oncology

## 2016-03-19 ENCOUNTER — Telehealth: Payer: Self-pay | Admitting: Hematology and Oncology

## 2016-03-19 NOTE — Telephone Encounter (Signed)
pt needed to resched 6/6 apt... apt resched to 6/13

## 2016-03-24 ENCOUNTER — Ambulatory Visit: Payer: Managed Care, Other (non HMO) | Admitting: Hematology and Oncology

## 2016-03-27 ENCOUNTER — Ambulatory Visit
Admission: RE | Admit: 2016-03-27 | Discharge: 2016-03-27 | Disposition: A | Discharge status: Home / Self Care | Payer: Managed Care, Other (non HMO) | Source: Ambulatory Visit | Attending: Hematology and Oncology | Admitting: Hematology and Oncology

## 2016-03-27 DIAGNOSIS — C50412 Malignant neoplasm of upper-outer quadrant of left female breast: Secondary | ICD-10-CM

## 2016-03-31 ENCOUNTER — Ambulatory Visit: Payer: Managed Care, Other (non HMO) | Admitting: Hematology and Oncology

## 2016-04-07 ENCOUNTER — Encounter: Payer: Self-pay | Admitting: Hematology and Oncology

## 2016-04-07 ENCOUNTER — Ambulatory Visit (HOSPITAL_BASED_OUTPATIENT_CLINIC_OR_DEPARTMENT_OTHER): Payer: Managed Care, Other (non HMO) | Admitting: Hematology and Oncology

## 2016-04-07 VITALS — BP 131/81 | HR 75 | Temp 98.5°F | Resp 19 | Wt 265.6 lb

## 2016-04-07 DIAGNOSIS — C7951 Secondary malignant neoplasm of bone: Secondary | ICD-10-CM

## 2016-04-07 DIAGNOSIS — C50412 Malignant neoplasm of upper-outer quadrant of left female breast: Secondary | ICD-10-CM | POA: Diagnosis not present

## 2016-04-07 DIAGNOSIS — C773 Secondary and unspecified malignant neoplasm of axilla and upper limb lymph nodes: Secondary | ICD-10-CM | POA: Diagnosis not present

## 2016-04-07 MED ORDER — TAMOXIFEN CITRATE 20 MG PO TABS: 20.0000 mg | ORAL_TABLET | Freq: Every day | ORAL | Status: DC

## 2016-04-07 MED ORDER — ALPRAZOLAM 1 MG PO TABS: 1.0000 mg | ORAL_TABLET | Freq: Every evening | ORAL | Status: DC | PRN

## 2016-04-07 NOTE — Progress Notes (Signed)
Patient Care Team: Kimberly Medin, MD as PCP - General (Internal Medicine) Kimberly Lose, MD as Consulting Physician (Hematology and Oncology) Kimberly Seltzer, MD as Consulting Physician (General Surgery) Kimberly Gibson, MD as Attending Physician (Radiation Oncology)  DIAGNOSIS: Breast cancer of upper-outer quadrant of left female breast [facility])   Staging form: Breast, AJCC 7th Edition     Clinical: Stage IV (T2, N1, M1) - Signed by Kimberly Eisenmenger, MD on 08/16/2014       Staging comments: Staged at breast conference 06/06/14.      Pathologic stage from 02/01/2015: Stage IV (yT1c, N3, M1) - Signed by Kimberly Cutter, MD on 03/07/2015       Staging comments: Staged on final lumpectomy specimen by Dr. Donato Reed.  SUMMARY OF ONCOLOGIC HISTORY:   Breast cancer of upper-outer quadrant of left female breast ([place])   05/18/2014 Mammogram Suspicious left upper outer quadrant 5 cm segmental area of calcifications.    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   05/30/2014 Breast MRI Left Breast: 12oclock: 21 x 23 x 30 mm, Numerous  level 1 and 2 LN; Liver lesions cycts by Liver MRI   06/07/2014 Initial Biopsy Left axillary lymph node biopsy invasive ductal carcinoma ER 100%, PR 11% Ki-67 15% HER-2 negative ratio 1.41   06/13/2014 PET scan left breast activity, hypermetabolic left axillary and left retropectoral lymph node, small lucent lesion in L1 vertebra which was biopsied and proven to be bone metastases   07/19/2014 Initial Biopsy Biopsy of L1 vertebra metastatic carcinoma breast primary, ER 100%, PR 4%, HER-2 negative ratio 1.3   07/26/2014 - 12/18/2014 Neo-Adjuvant Chemotherapy Even though she has L1 solitary bone metastases, patient is being treated definitively with neoadjuvant chemotherapy with dose dense Adriamycin and Cytoxan to be followed by weekly Taxol x12   12/25/2014 PET scan Interval improvement in size and metabolic  activity associated with left axillary lymph nodes consistent with treatment response, no hypermetabolic distant metastases, L1 vertebral body has no activity   12/25/2014 Breast MRI No residual enhancement left breast, decreased left axillary lymphadenopathy, indicating response to treatment but lymph nodes are still present   01/30/2015 Surgery Left breast lumpectomy: Invasive ductal carcinoma grade 2, 1.2 cm, 12/15 lymph nodes positive, extracapsular tumor extension present, T1c N3M1 stage IV    03/12/2015 - 04/24/2015 Radiation Therapy  adjuvant radiation therapy with Dr. Isidore Reed   05/16/2015 -  Anti-estrogen oral therapy Anastrozole 1 mg daily 10 years is the plan; Kimberly Reed was recommended but she was not reachable and hence it was not filled, switched to tamoxifen 04/07/2016   01/10/2016 Imaging CT chest abdomen pelvis: No evidence of metastatic disease    CHIEF COMPLIANT: Follow-up to discuss antiestrogen treatment options  INTERVAL HISTORY: Kimberly Reed is a 60 year old with above-mentioned history of left breast cancer treated with neoadjuvant chemotherapy followed by lumpectomy radiation and was on anastrozole but she could not tolerate it and she discontinued it. She is here to discuss if any other options may be available to her. She tells me that when she was on anastrozole she was very depressed and was feeling horrible. Since she has been off she is feeling a lot better although she continues to have depression anxiety and emotional problems. She continues to work.  REVIEW OF SYSTEMS:   Constitutional: Denies fevers, chills or abnormal weight loss Eyes: Denies blurriness of vision Ears, nose, mouth, throat, and face: Denies mucositis or sore throat Respiratory:  Denies cough, dyspnea or wheezes Cardiovascular: Denies palpitation, chest discomfort Gastrointestinal:  Denies nausea, heartburn or change in bowel habits Skin: Denies abnormal skin rashes Lymphatics: Denies new lymphadenopathy or  easy bruising Neurological:Denies numbness, tingling or new weaknesses Behavioral/Psych: Depression and anxiety Extremities: No lower extremity edema Breast:  denies any pain or lumps or nodules in either breasts All other systems were reviewed with the patient and are negative.  I have reviewed the past medical history, past surgical history, social history and family history with the patient and they are unchanged from previous note.  ALLERGIES:  is allergic to other and effexor.  MEDICATIONS:  Current Outpatient Prescriptions  Medication Sig Dispense Refill  . ALPRAZolam (XANAX) 1 MG tablet Take 1 tablet (1 mg total) by mouth at bedtime as needed for anxiety. 30 tablet 0  . brimonidine-timolol (COMBIGAN) 0.2-0.5 % ophthalmic solution Place 1 drop into the right eye every 12 (twelve) hours.     . dorzolamide (TRUSOPT) 2 % ophthalmic solution Place 1 drop into the right eye 2 (two) times daily.     [name] levothyroxine (SYNTHROID, LEVOTHROID) 112 MCG tablet Take 1 tablet (112 mcg total) by mouth daily. 90 tablet 2  . non-metallic deodorant (ALRA) MISC Apply 1 application topically daily as needed.    [name] OVER THE COUNTER MEDICATION Take 1 capsule by mouth daily.    . tamoxifen (NOLVADEX) 20 MG tablet Take 1 tablet (20 mg total) by mouth daily. 30 tablet 0  . Travoprost, BAK Free, (TRAVATAN) 0.004 % SOLN ophthalmic solution Place 1 drop into the right eye at bedtime.      No current facility-administered medications for this visit.   Facility-Administered Medications Ordered in Other Visits  Medication Dose Route Frequency Provider Last Rate Last Dose  . sodium chloride 0.9 % injection 10 mL  10 mL Intracatheter PRN Kimberly Lose, MD   10 mL at 10/12/14 1359  . sodium chloride 0.9 % injection 10 mL  10 mL Intracatheter PRN Kimberly Lose, MD   10 mL at 12/11/14 1653    PHYSICAL EXAMINATION: ECOG PERFORMANCE STATUS: 1 - Symptomatic but completely ambulatory  Filed Vitals:   04/07/16 1349    BP: 131/81  Pulse: 75  Temp: 98.5 F (36.9 C)  Resp: 19   Filed Weights   04/07/16 1349  Weight: 265 lb 9.6 oz (120.475 kg)    GENERAL:alert, no distress and comfortable SKIN: skin color, texture, turgor are normal, no rashes or significant lesions EYES: normal, Conjunctiva are pink and non-injected, sclera clear OROPHARYNX:no exudate, no erythema and lips, buccal mucosa, and tongue normal  NECK: supple, thyroid normal size, non-tender, without nodularity LYMPH:  no palpable lymphadenopathy in the cervical, axillary or inguinal LUNGS: clear to auscultation and percussion with normal breathing effort HEART: regular rate & rhythm and no murmurs and no lower extremity edema ABDOMEN:abdomen soft, non-tender and normal bowel sounds MUSCULOSKELETAL:no cyanosis of digits and no clubbing  NEURO: alert & oriented x 3 with fluent speech, no focal motor/sensory deficits EXTREMITIES: No lower extremity edema  LABORATORY DATA:  I have reviewed the data as listed   Chemistry      Component Value Date/Time   NA 141 01/10/2016 1539   NA 140 01/16/2015 1000   K 3.8 01/10/2016 1539   K 4.9 01/16/2015 1000   CL 104 01/16/2015 1000   CO2 29 01/10/2016 1539   CO2 30 01/16/2015 1000   BUN 18.1 01/10/2016 1539   BUN 19 01/16/2015 1000   CREATININE  1.0 01/10/2016 1539   CREATININE 0.71 01/16/2015 1000   CREATININE 0.73 03/05/2014 1044      Component Value Date/Time   CALCIUM 10.1 01/10/2016 1539   CALCIUM 10.1 01/16/2015 1000   ALKPHOS 92 01/10/2016 1539   ALKPHOS 86 01/16/2015 1000   AST 15 01/10/2016 1539   AST 15 01/16/2015 1000   ALT 17 01/10/2016 1539   ALT 12 01/16/2015 1000   BILITOT 0.50 01/10/2016 1539   BILITOT 0.4 01/16/2015 1000       Lab Results  Component Value Date   WBC 4.8 01/10/2016   HGB 12.9 01/10/2016   HCT 38.9 01/10/2016   MCV 90.9 01/10/2016   PLT 285 01/10/2016   NEUTROABS 2.4 01/10/2016     ASSESSMENT & PLAN:  Breast cancer of upper-outer  quadrant of left female breast ([place]) Metastatic breast cancer in the left breast involving left axillary lymph nodes and isolated L1 vertebral metastases stage IV disease, ER 100 percent, PR 4%, HER-2 negative ratio 1.3 Ki-67 15%  Chemotherapy summary: Completed dose dense Adriamycin Cytoxan 4 started 07/26/2014. Completed 12 weeks of Taxol by 12/18/2014. Left lumpectomy 01/30/2015: Invasive ductal carcinoma grade 2, 1.2 cm, 12/15 lymph nodes positive, extracapsular tumor extension present, T1c N3M1 stage IV Status post radiation completed 04/24/2015  Current treatment: Anastrozole since July 2016 discontinued December 2016, switched to tamoxifen 04/07/2016  Chest tightness, fatigue, dizziness, weakness, throbbing sensation: Patient is very concerned of cancer recurrence.  CT CAP 01/10/16: No evidence of metastatic disease, 3 mm nonobstructing left renal calculus Dizziness and confusion with short-term memory loss:   Breast Cancer Surveillance: 1. Breast exam 04/07/2016: Normal 2. Mammogram 03/27/2016 2 mm calcifications, six-month follow-up mammogram recommended Discussed the differences between different breast density categories.   Return to clinic in 6 months for follow-up on antiestrogen therapy.  No orders of the defined types were placed in this encounter.   The patient has a good understanding of the overall plan. she agrees with it. she will call with any problems that may develop before the next visit here.   Kimberly Eisenmenger, MD 04/07/2016

## 2016-04-07 NOTE — Assessment & Plan Note (Signed)
Metastatic breast cancer in the left breast involving left axillary lymph nodes and isolated L1 vertebral metastases stage IV disease, ER 100 percent, PR 4%, HER-2 negative ratio 1.3 Ki-67 15%  Chemotherapy summary: Completed dose dense Adriamycin Cytoxan 4 started 07/26/2014. Completed 12 weeks of Taxol by 12/18/2014. Left lumpectomy 01/30/2015: Invasive ductal carcinoma grade 2, 1.2 cm, 12/15 lymph nodes positive, extracapsular tumor extension present, T1c N3M1 stage IV Status post radiation completed 04/24/2015  Current treatment: Anastrozole since July 2016  Anastrozole Toxicities: 1. Intermittent hot flashes 2. Arthritis in the knees 3. Skin sensitivity   Chest tightness, fatigue, dizziness, weakness, throbbing sensation: Patient is very concerned of cancer recurrence.  CT CAP 01/10/16: No evidence of metastatic disease, 3 mm nonobstructing left renal calculus I reassured the patient and based upon the CT scans we do not see any evidence of metastatic disease.  Dizziness and confusion with short-term memory loss: She did not undergo the CT scans of the head were ordered.  Breast Cancer Surveillance: 1. Breast exam 04/07/2016: Normal 2. Mammogram 03/27/2016 No abnormalities. Postsurgical changes. Breast Density Category C. I recommended that she get 3-D mammograms for surveillance. Discussed the differences between different breast density categories.   Return to clinic in 6 months for follow-up on antiestrogen therapy.

## 2016-04-21 ENCOUNTER — Telehealth: Payer: Self-pay | Admitting: *Deleted

## 2016-04-21 NOTE — Telephone Encounter (Signed)
Message from patient that the medication Dr. Lindi Adie put me on is making me sick. She stated that he said there was another medication he could put her on. Called to get more information and phone went to voice mail. Advised to call clinic.

## 2016-05-01 ENCOUNTER — Ambulatory Visit (INDEPENDENT_AMBULATORY_CARE_PROVIDER_SITE_OTHER): Payer: Managed Care, Other (non HMO) | Admitting: Internal Medicine

## 2016-05-01 ENCOUNTER — Encounter: Payer: Self-pay | Admitting: Internal Medicine

## 2016-05-01 VITALS — BP 134/80 | Temp 98.7°F | Wt 270.8 lb

## 2016-05-01 DIAGNOSIS — R42 Dizziness and giddiness: Secondary | ICD-10-CM | POA: Diagnosis not present

## 2016-05-01 DIAGNOSIS — R410 Disorientation, unspecified: Secondary | ICD-10-CM

## 2016-05-01 DIAGNOSIS — K9041 Non-celiac gluten sensitivity: Secondary | ICD-10-CM

## 2016-05-01 DIAGNOSIS — F331 Major depressive disorder, recurrent, moderate: Secondary | ICD-10-CM

## 2016-05-01 DIAGNOSIS — R198 Other specified symptoms and signs involving the digestive system and abdomen: Secondary | ICD-10-CM

## 2016-05-01 DIAGNOSIS — K900 Celiac disease: Secondary | ICD-10-CM

## 2016-05-01 LAB — CBC WITH DIFFERENTIAL/PLATELET
BASOS PCT: 0.6 % (ref 0.0–3.0)
Basophils Absolute: 0 10*3/uL (ref 0.0–0.1)
EOS PCT: 3.5 % (ref 0.0–5.0)
Eosinophils Absolute: 0.2 10*3/uL (ref 0.0–0.7)
HCT: 37.3 % (ref 36.0–46.0)
Hemoglobin: 12.3 g/dL (ref 12.0–15.0)
Lymphocytes Relative: 30.3 % (ref 12.0–46.0)
Lymphs Abs: 1.7 10*3/uL (ref 0.7–4.0)
MCHC: 32.9 g/dL (ref 30.0–36.0)
MCV: 89.4 fl (ref 78.0–100.0)
MONO ABS: 0.6 10*3/uL (ref 0.1–1.0)
MONOS PCT: 11.3 % (ref 3.0–12.0)
Neutro Abs: 3.1 10*3/uL (ref 1.4–7.7)
Neutrophils Relative %: 54.3 % (ref 43.0–77.0)
Platelets: 333 10*3/uL (ref 150.0–400.0)
RBC: 4.17 Mil/uL (ref 3.87–5.11)
RDW: 15.2 % (ref 11.5–15.5)
WBC: 5.7 10*3/uL (ref 4.0–10.5)

## 2016-05-01 LAB — CREATININE, SERUM: CREATININE: 0.84 mg/dL (ref 0.40–1.20)

## 2016-05-01 LAB — IBC PANEL
Iron: 87 ug/dL (ref 42–145)
Saturation Ratios: 19.4 % — ABNORMAL LOW (ref 20.0–50.0)
Transferrin: 320 mg/dL (ref 212.0–360.0)

## 2016-05-01 LAB — VITAMIN B12: VITAMIN B 12: 427 pg/mL (ref 211–911)

## 2016-05-01 LAB — TSH: TSH: 8.49 u[IU]/mL — ABNORMAL HIGH (ref 0.35–4.50)

## 2016-05-01 NOTE — Progress Notes (Signed)
Pre visit review using our clinic review tool, if applicable. No additional management support is needed unless otherwise documented below in the visit note.  Chief Complaint  Patient presents with  . Dizziness    HPI: Kimberly Reed 60 y.o.  Comes in for sda appt  For  "vertigo".    dizzines s  Cant stand up for loing     And hard to focus.  Beginning of year   But    Changed  Cancer   Med and then that got better .    Vertigo never seenms to go away  She describes it as her head is not right and she feels off balance when she stands up and feels like she has to lay down. Under rx for brast cancer suppression   After chemo didn't feel this way until earlier in the year. She was put on a medicine made her feel bad and then to tamoxifen and got her sick  See message  And   Had   Same sx  Bad feeling    Vertigo   ROS: See pertinent positives and negatives per HPI. Her complaints include dizziness when standing disorientation confused and fogginess insomnia anxiety depression headaches heartburn and stomach burning weakness hard to stand sometimes loose stools no blood sometimes dark bones hurt hurts all over hard to walk and stand long periods. 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 Denies any major vision changes is under treatment for glaucoma with no new drops. States that she has had depression in the past has been treated for it but most of the medicines make her sick such as nausea. She has seen different psychiatrists and counselors in the past. She's not sure which medicines she has been on because she couldn't remember them all although feels that maybe Abilify causes much illness and made her feel a little better. She is hesitant to go to a specialist and spend more money as she says. But wants some help in figuring out why she feels so bad what to do about it. She doesn't have any  hearing loss or wishing sounds. No major congestion. She states that she is up-to-date on colonoscopy and that she had a GI evaluation before the cancer treatment and was told that she didn't have celiac but she had gluten intolerance and she may benefit from that diet change acids changing the diet may help her feel better. Describes difficulty when she first gets up in the morning and stands upright and also Worse after work  .   And first wake up.  Groggy and hard to fucntion.  Hard to stand for a long time . Appetite normal so much .   Last Friday .  Has gluacoma  Vf minor worse.  Ok .     Past Medical History  Diagnosis Date  . Hypothyroid   . Glaucoma   . Hyperlipemia   . Migraine without aura   . History of syncope 2008    loss of bladder control eval by Neuro  . Atypical chest pain   . Family history of heart disease   . Anxiety   . Depression   . Hot flashes   . Anemia   . Insomnia   . Cancer ([place])     left breast dx. [id number] chemo planned to start 07-09-14- Dr. Lindi Adie[facility]  .  Wears glasses   . Breast cancer ([place]) 05/2014    left  . Glaucoma     Family History  Problem Relation Age of Onset  . Aneurysm Mother   . Kidney disease Mother   . Hypertension Father   . Dementia Father   . Kidney disease Brother   . Heart disease      "all of moms side"  . Heart failure Cousin   . Depression      Social History   Social History  . Marital Status: Single    Spouse Name: N/A  . Number of Children: N/A  . Years of Education: N/A   Occupational History  . 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 Smoking status: Never Smoker   . Smokeless tobacco: Never Used  . Alcohol Use: No  . Drug Use: No     Comment: in 20's but not now  . Sexual Activity:    Partners: Female     Comment: menarche age 49, P 0, no HRT   Other Topics Concern  . 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     Hh of 1  2 cats      No tad    8 hours of sleep.       On  disabilirty for  Breast cancer .   Work now  48- 40 per week           Outpatient Prescriptions Prior to Visit  Medication Sig Dispense Refill  . ALPRAZolam (XANAX) 1 MG tablet Take 1 tablet (1 mg total) by mouth at bedtime as needed for anxiety. 30 tablet 0  . brimonidine-timolol (COMBIGAN) 0.2-0.5 % ophthalmic solution Place 1 drop into the right eye every 12 (twelve) hours.     . dorzolamide (TRUSOPT) 2 % ophthalmic solution Place 1 drop into the right eye 2 (two) times daily.     [name] levothyroxine (SYNTHROID, LEVOTHROID) 112 MCG tablet Take 1 tablet (112 mcg total) by mouth daily. 90 tablet 2  . non-metallic deodorant (ALRA) MISC Apply 1 application topically daily as needed.    [name] OVER THE COUNTER MEDICATION Take 1 capsule by mouth daily.    . Travoprost, BAK Free, (TRAVATAN) 0.004 % SOLN ophthalmic solution Place 1 drop into the right eye at bedtime.     . tamoxifen (NOLVADEX) 20 MG tablet Take 1 tablet (20 mg total) by mouth daily. 30 tablet 0   Facility-Administered Medications Prior to Visit  Medication Dose Route Frequency Provider Last Rate Last Dose  . sodium chloride 0.9 % injection 10 mL  10 mL Intracatheter PRN Nicholas Lose, MD   10 mL at 10/12/14 1359  . sodium chloride 0.9 % injection 10 mL  10 mL Intracatheter PRN Nicholas Lose, MD   10 mL at 12/11/14 1653     EXAM:  BP 134/80 mmHg  Temp(Src) 98.7 F (37.1 C) (Oral)  Wt 270 lb 12.8 oz (122.834 kg)  LMP 02/15/2013  Body mass index is 45.06 kg/(m^2).  GENERAL: vitals reviewed and listed above, alert, oriented, appears well hydrated and in no acute distressShe is wearing dark glasses but takes them off this is related to her eye disease. She looks mildly depressed and mildly anxious. Able to walk but feels lightheaded when she does. She is well groomed and has her wig on. HEENT: atraumatic, conjunctiva  clear, no obvious abnormalities on inspection  of external nose and ears OP : no lesion edema or exudate  NECK: no obvious masses on inspection palpation  LUNGS: clear to auscultation bilaterally, no wheezes, rales or rhonchi, good air movement CV: HRRR, no clubbing cyanosis or  peripheral edema nl cap refill  MS: moves all extremities without noticeable focal  abnormality hasn't difficulty with her knees feel stiff when she rises from the chair. PSYCH:  Looks anxious and mod depressed. He is mildly unsteady but appears to be nonfocal. Wt Readings from Last 3 Encounters:  05/01/16 270 lb 12.8 oz (122.834 kg)  04/07/16 265 lb 9.6 oz (120.475 kg)  01/14/16 247 lb 4.8 oz (112.175 kg)   BP Readings from Last 3 Encounters:  05/01/16 134/80  04/07/16 131/81  01/14/16 128/78      ASSESSMENT AND PLAN:  Discussed the following assessment and plan:  Dizzy - Plan: TSH, Vitamin B12, Celiac panel 10, IBC panel, CBC with Differential/Platelet, Creatinine, Serum  Confusion - Plan: TSH, Vitamin B12, Celiac panel 10, IBC panel, CBC with Differential/Platelet, Creatinine, Serum  GI symptoms - Plan: TSH, Vitamin B12, Celiac panel 10, IBC panel, CBC with Differential/Platelet, Creatinine, Serum  Gluten intolerance - Plan: TSH, Vitamin B12, Celiac panel 10, IBC panel, CBC with Differential/Platelet, Creatinine, Serum  Moderate episode of recurrent major depressive disorder ([place]) Patient has numerous complaints and feels badly her pH Q 15 shows a score of 20 GAD 7 score of 18 with anxiety attacks. PH Q9 score of 22 not suicidal 1 score for motor rest level threes very difficult. -Patient advised to return or notify health care team  if symptoms worsen ,persist or new concerns arise. Difficult situation she is obviously depressed and anxious but according to her most meds make her feel sick. She may well have deconditioning from lying down most of the time when she is not at work which is a sitting job. She may be dehydrated and apparently some of  these symptoms came on and last for 6 months. And she doesn't related to her initial chemotherapy. Her brain fogginess could be a combination. Is very hard to direct her as she has financial limitations and complains of side effects of many medicines. Interesting that told she had glucose gluten intolerance but not celiac disease I see the endoscopic report but not the biopsy report. This was done 3 years ago. Will check thyroid anemia  b12 etc.  Patient Instructions  Many of your symptoms can be from  Anxiety  Depression and dehydration   But your blood pressure  Is  On the low side when you stand  Up.    Need to get records  From dr Collene Mares about the gluten in tolerance  Can try that diet with caution  To avoid excess sugars.   Will send a flag to your oncologist if they think  Any of these sx could be related to your medications.   Laying down  Most days  Many hours can have your deconditioned also .   Consider seein neurology if ongoing   Progressive  Sx . Of imbalance   Will notify you  of labs when available.  and  Let you know  Advice .      Standley Brooking. Kimberly Reed M.D. Total visit 65mins > 50% spent counseling and coordinating care as indicated in above note and in instructions to patient .      Lab Results  Component Value Date   WBC 5.7  05/01/2016   HGB 12.3 05/01/2016   HCT 37.3 05/01/2016   PLT 333.0 05/01/2016   GLUCOSE 115 01/10/2016   CHOL 248* 01/16/2015   TRIG 96.0 01/16/2015   HDL 53.10 01/16/2015   LDLDIRECT 167.3 03/16/2011   LDLCALC 176* 01/16/2015   ALT 17 01/10/2016   AST 15 01/10/2016   NA 141 01/10/2016   K 3.8 01/10/2016   CL 104 01/16/2015   CREATININE 0.84 05/01/2016   BUN 18.1 01/10/2016   CO2 29 01/10/2016   TSH 8.49* 05/01/2016   INR 1.02 07/19/2014  tsh is off and could contribute  Will adjust  Advise dosing adjustment and then fu if   persistent or progresseive other eval   Neuro and psych  ( cost and time and recluctanct to see psych at this  time somewhat barrier)  Celiac panel pending

## 2016-05-01 NOTE — Patient Instructions (Addendum)
Many of your symptoms can be from  Anxiety  Depression and dehydration   But your blood pressure  Is  On the low side when you stand  Up.    Need to get records  From dr Collene Mares about the gluten in tolerance  Can try that diet with caution  To avoid excess sugars.   Will send a flag to your oncologist if they think  Any of these sx could be related to your medications.   Laying down  Most days  Many hours can have your deconditioned also .   Consider seein neurology if ongoing   Progressive  Sx . Of imbalance   Will notify you  of labs when available.  and  Let you know  Advice .

## 2016-05-04 LAB — CELIAC PANEL 10
ENDOMYSIAL SCREEN: NEGATIVE
GLIADIN IGA: 6 U (ref <20)
Gliadin IgG: 4 Units (ref <20)
IgA: 93 mg/dL (ref 81–463)
TISSUE TRANSGLUTAMINASE AB, IGA: 1 U/mL (ref <4)
Tissue Transglut Ab: 19 U/mL — ABNORMAL HIGH (ref <6)

## 2016-05-06 ENCOUNTER — Other Ambulatory Visit: Payer: Self-pay | Admitting: Family Medicine

## 2016-05-06 DIAGNOSIS — E039 Hypothyroidism, unspecified: Secondary | ICD-10-CM

## 2016-05-06 MED ORDER — LEVOTHYROXINE SODIUM 125 MCG PO TABS: 125.0000 ug | ORAL_TABLET | Freq: Every day | ORAL | Status: DC

## 2016-07-11 IMAGING — MG MM DIGITAL DIAGNOSTIC UNILAT*L*
2 series · 2 of 2 positions shown · non-contrast
Comparison: None

CLINICAL DATA: Screening callback for questioned left breast
calcifications. The patient's most recent exam was performed in [place] reportedly 15 years ago.

EXAM:
DIGITAL DIAGNOSTIC  left MAMMOGRAM

[L CC]
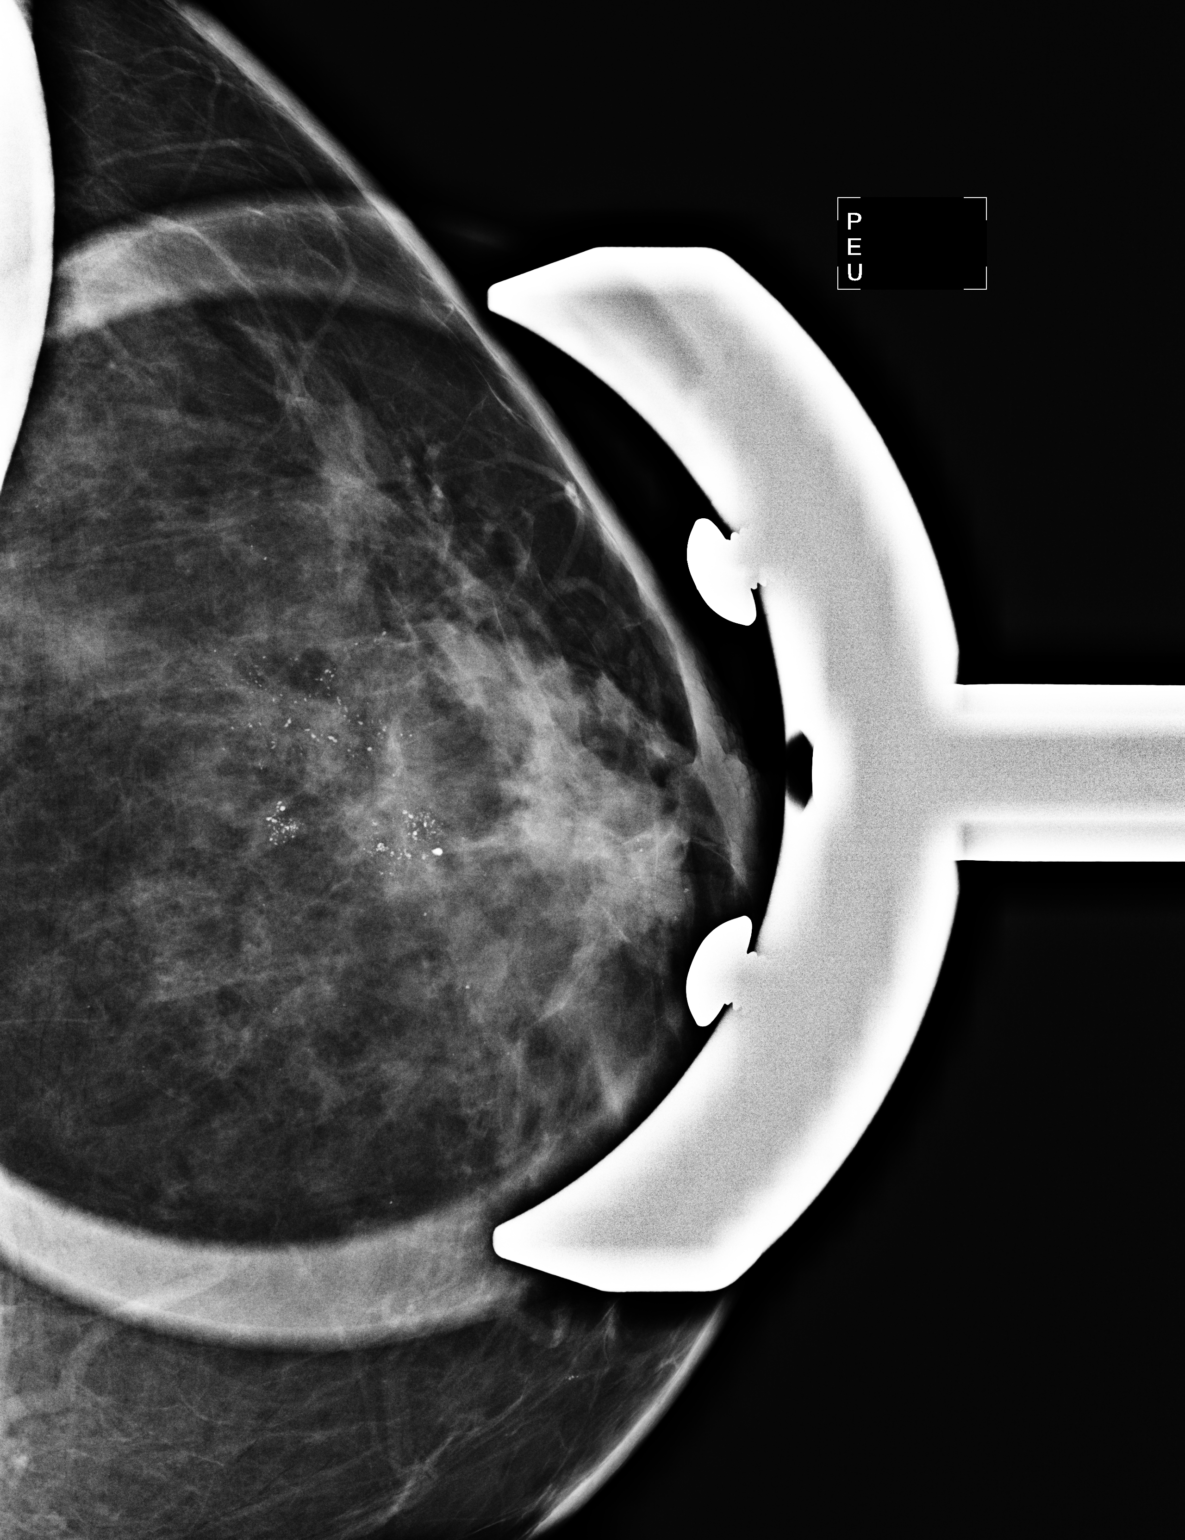

[L ML]
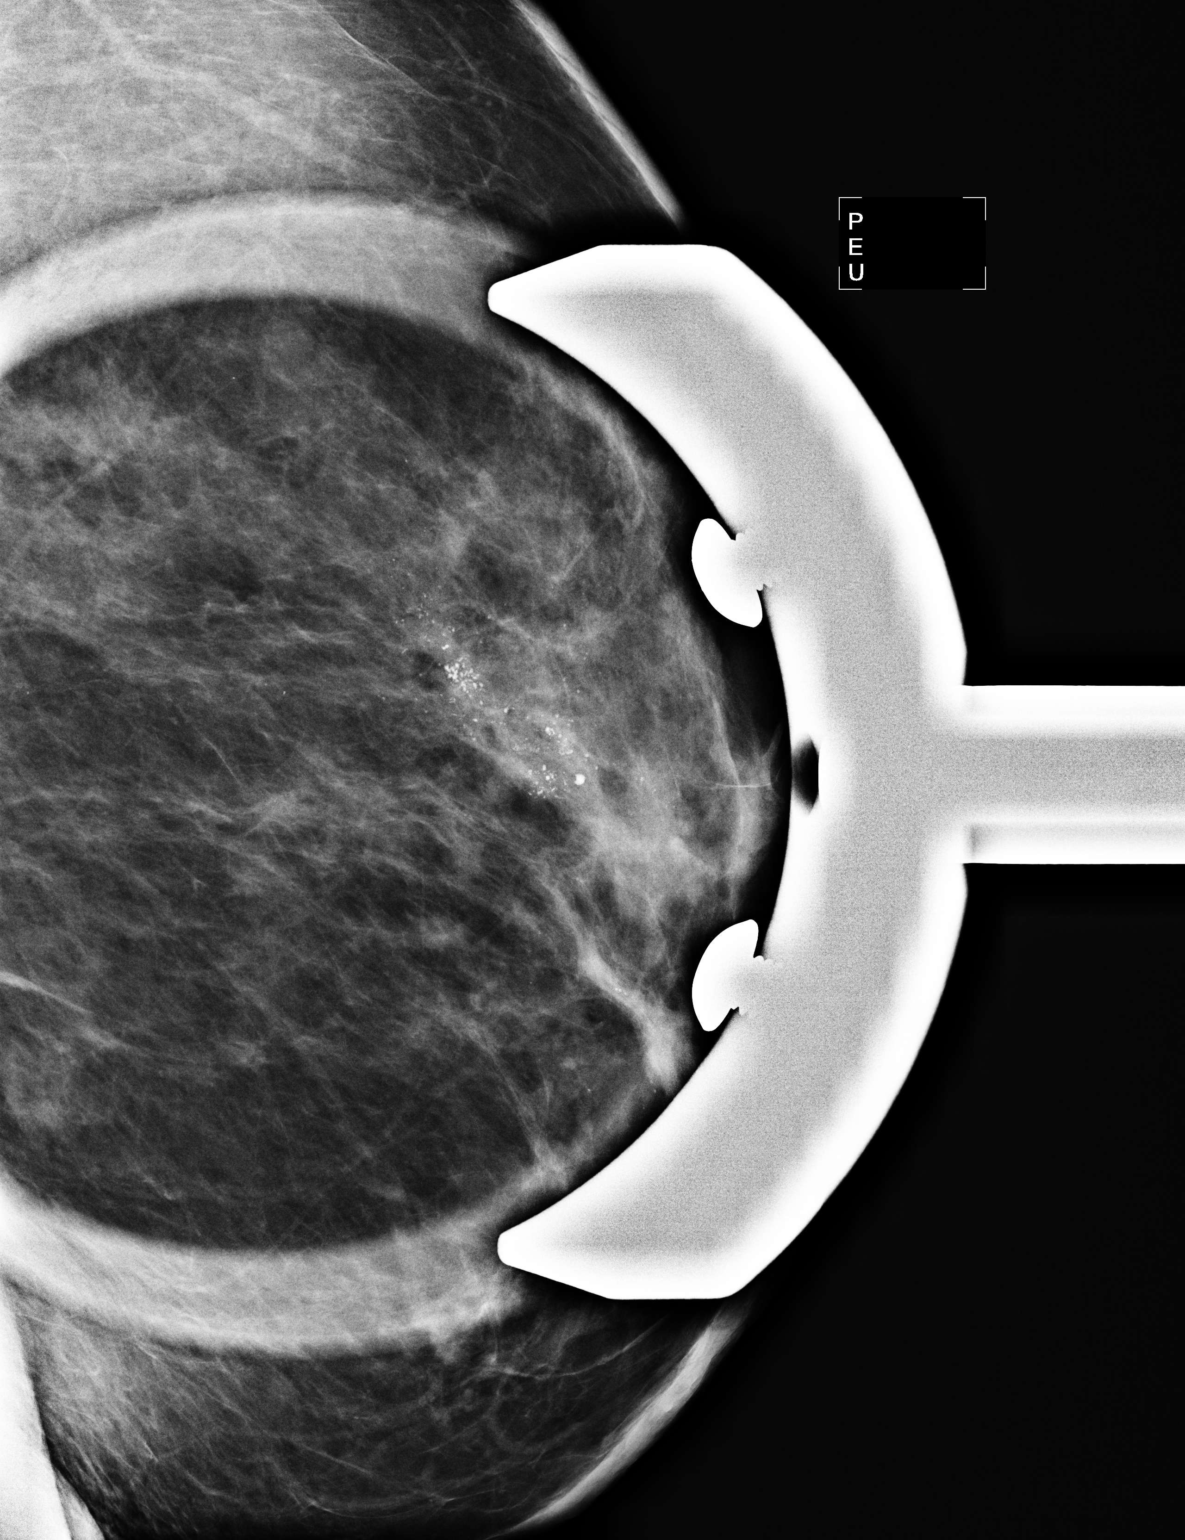

[2 of 2 positions shown; findings below may reference images not displayed]

ACR Breast Density Category c: The breast tissue is heterogeneously
dense, which may obscure small masses.
FINDINGS: There is a 5 cm segmental area of coarse heterogeneous
calcifications in the left upper outer quadrant, corresponding to
the screening mammographic finding.
IMPRESSION: Suspicious left upper outer quadrant 5 cm segmental area of
calcifications. Stereotactic core needle biopsy has been scheduled
05/24/2014 at 10 a.m..

RECOMMENDATION:
Left stereotactic core needle biopsy

I have discussed the findings and recommendations with the patient.
Results were also provided in writing at the conclusion of the
visit. If applicable, a reminder letter will be sent to the patient
regarding the next appointment.

BI-RADS CATEGORY  4: Suspicious.

## 2016-07-16 ENCOUNTER — Ambulatory Visit: Payer: Managed Care, Other (non HMO) | Admitting: Hematology and Oncology

## 2016-07-31 IMAGING — US US EXTREM UP*L* LTD
1 series · 13 of 22 positions shown · non-contrast
Comparison: Breast MRI dated 06/06/2014.

CLINICAL DATA: Recently diagnosed left breast DCIS on stereotactic
core biopsy. Breast MRI demonstrates enlarged left axillary lymph
nodes. Ultrasound requested.

EXAM:
ULTRASOUND OF THE left AXILLA

[Series 1: us extrem up*left* ltd · 13 of 22 slices shown]
[im 1/22]
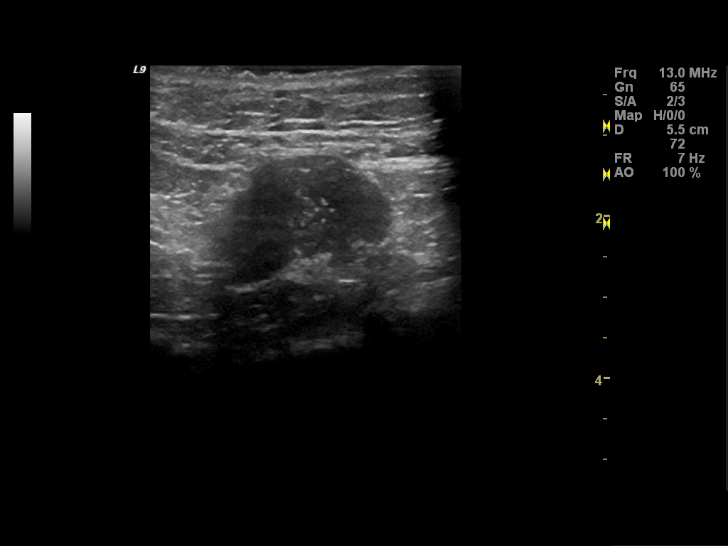
[im 3/22]
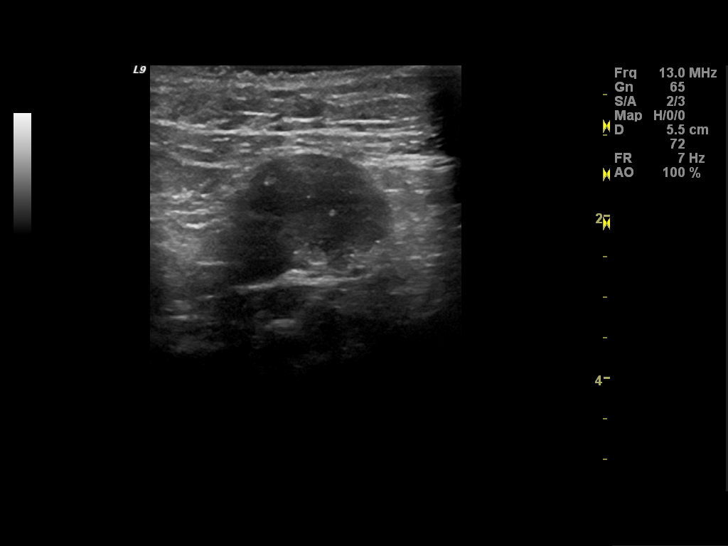
[im 5/22]
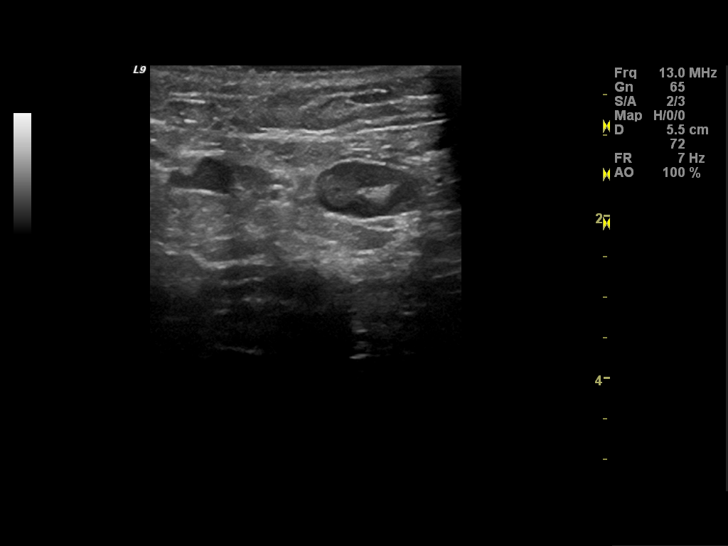
[im 6/22]
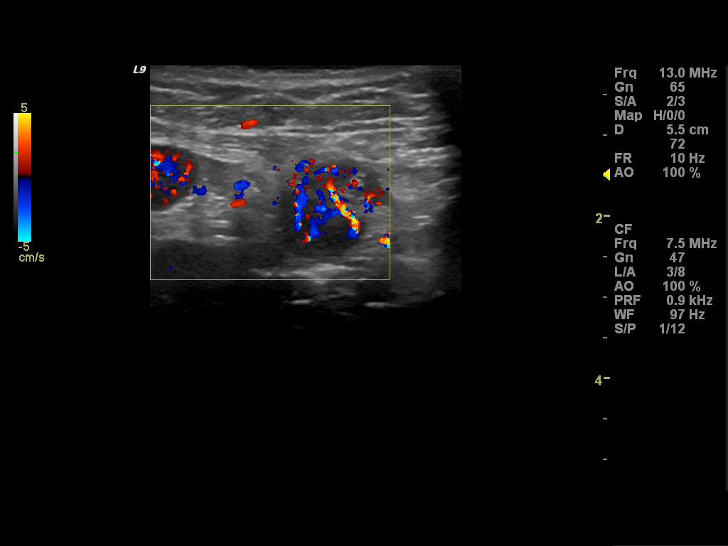
[im 8/22]
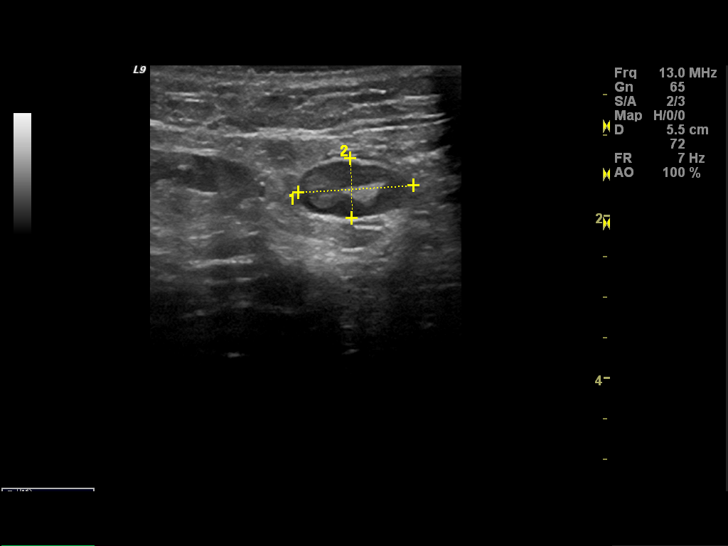
[im 10/22]
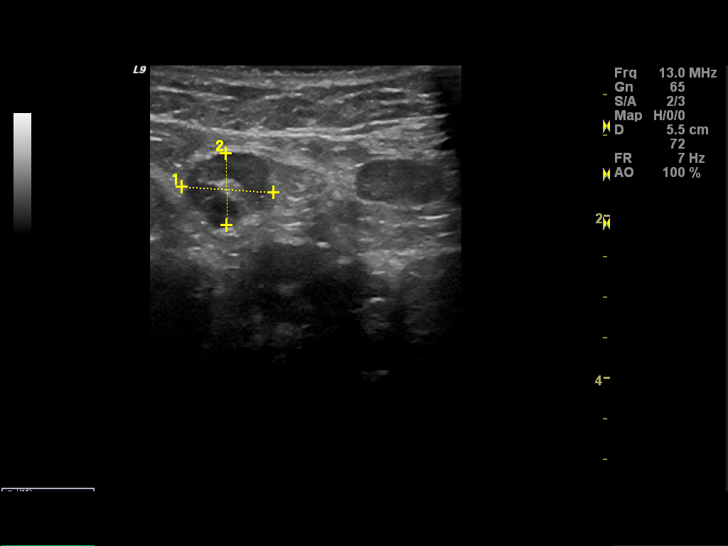
[im 12/22]
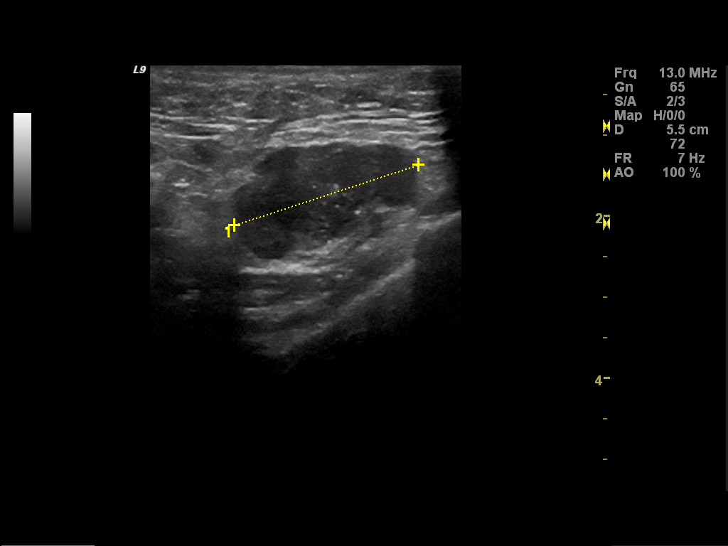
[im 13/22]
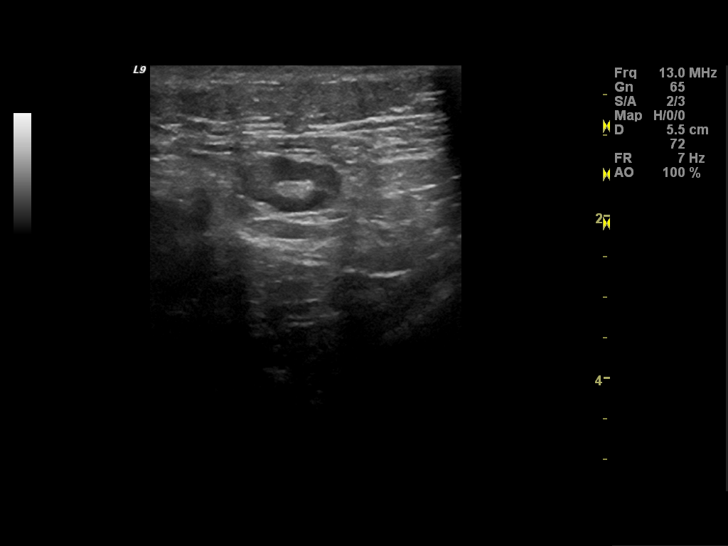
[im 15/22]
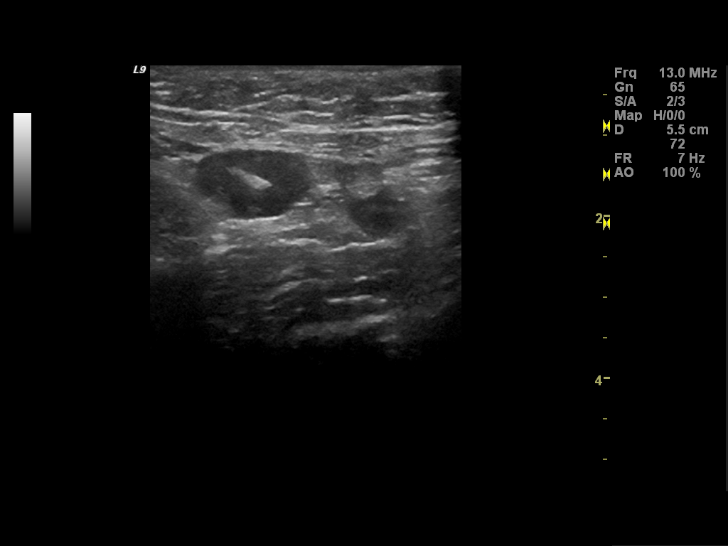
[im 17/22]
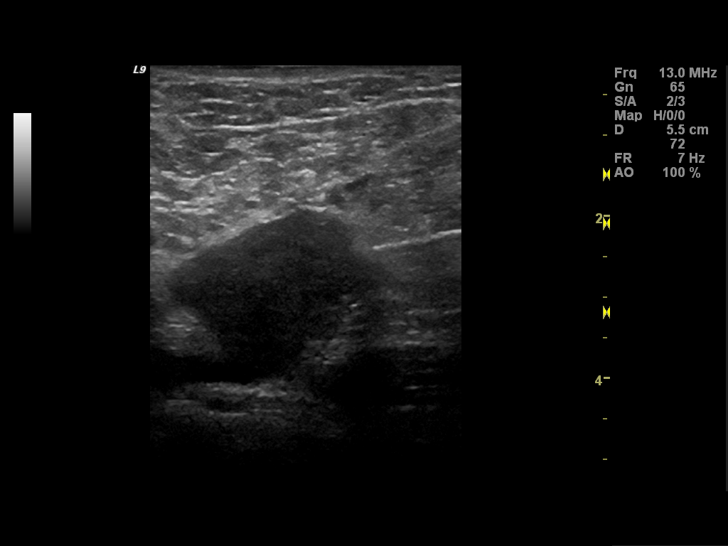
[im 18/22]
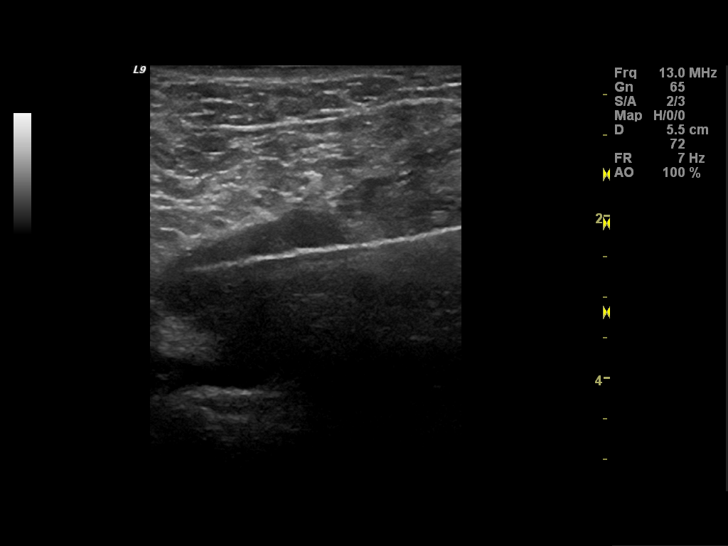
[im 20/22]
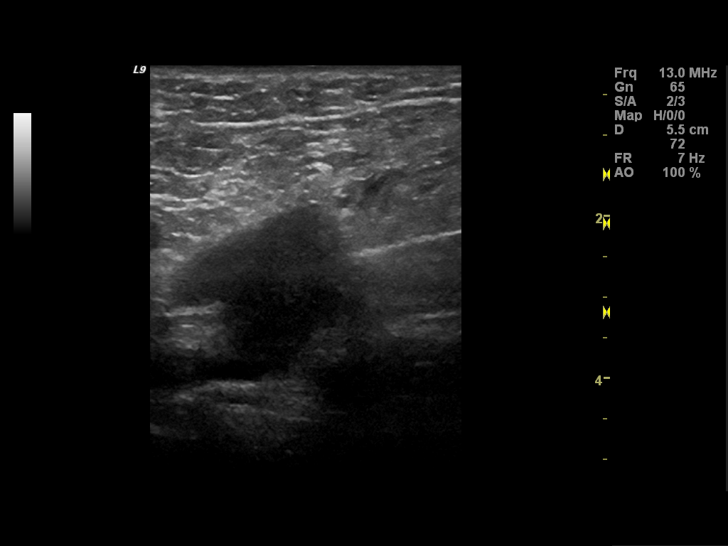
[im 22/22]
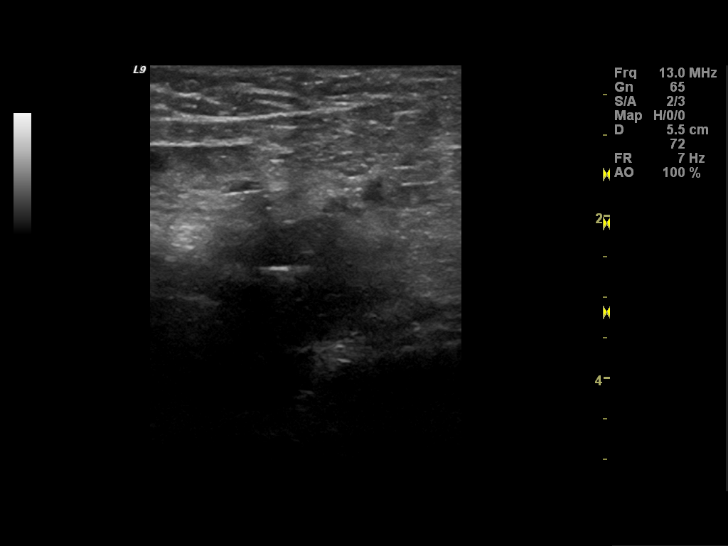

[13 of 22 positions shown; findings below may reference images not displayed]

FINDINGS: On physical exam,there is a 3 cm palpable level 1 left axillary
lymph node present. This is mobile..

Ultrasound is performed, showing an enlarged, rounded, hypervascular
low left axillary lymph node measuring 2.4 x 2.2 x 1.3 cm in size.
This is associated with loss of the normal fatty hilum. Also, there
are calcifications present within this lymph node. There are also 2
adjacent hypervascular rounded lymph nodes with diffuse cortical
thickening worrisome for additional metastatic lymph nodes. These
measure 1.5 and 1.3 cm in size. Ultrasound-guided core biopsy of the
larger lymph node is recommended. This will be performed and
reported separately..
IMPRESSION: Three abnormal appearing level 1 left axillary lymph nodes as
discussed above worrisome for metastatic lymph nodes. Tissue
sampling of the largest lymph node is recommended and
ultrasound-guided core biopsy will be performed and reported
separately.

RECOMMENDATION:
Left axillary lymph node ultrasound-guided core biopsy.

I have discussed the findings and recommendations with the patient.
Results were also provided in writing at the conclusion of the
visit. If applicable, a reminder letter will be sent to the patient
regarding the next appointment.

BI-RADS CATEGORY  5: Highly suggestive of malignancy - appropriate
action should be taken.

## 2016-08-26 ENCOUNTER — Telehealth: Payer: Self-pay | Admitting: *Deleted

## 2016-08-26 DIAGNOSIS — C50412 Malignant neoplasm of upper-outer quadrant of left female breast: Secondary | ICD-10-CM

## 2016-08-26 NOTE — Telephone Encounter (Signed)
FYI Walk in form received reads "Left arm swollen & numb, vertigo, disorientation, memory problems, head feels spinning, extreme fatigue, hard to walk, extreme weakness, cannot concentrate or focus"  Reports the "swelling started a few months ago.  Other symptoms started New Years.  I never have experienced any arm swelling but I think this is lymphedema.  Head feels like a balloon.  I work in Therapist, art.  It's hard to work.  All I do is work and sleep.  I tried to join the gym but can only ride a bike."  Left arm visibly larger, wearing thin long sleeve T-shirt and thin jacket.  This nurse could feel tightness to left forearm and upper arm.  No notable swelling noted to hands.  Reports left arm slightly red but unable to move clothing over extremities.      Spoke with Dr. Lindi Adie about left arm S/P left breast lumpectomy. Verbal order received and read back from Dr. Lindi Adie for [place] of left arm r/o blood clot tomorrow see [facility] next week.  Instructed to go to PCP for other symptoms.  "I'm transitioning to a new PCP to be seen November 16 th." "I do not want an [place] and that's a waste of time to see NP in [facility].  I think it's lymphedema.  I live alone.  I do not have money to pay for an [place].  I work every day and can't do anything until next Tuesday.  I thought I'd just need lymphedema clinic.  I saw someone once but do not recall her name."  Advised PT is not inexpensive.  Dr. Lindi Adie notified of patient's response.  Verbal order received and read back for PT consult for Lymphedema clinic. No trouble or distress noted with gait by this nurse.  She did ask to be seated while talking with this nurse.

## 2016-08-27 NOTE — Telephone Encounter (Signed)
Patient requested yesterday for a call today with provider order information.  Called receiving voicemail.  Message left that Outpatient rehab order received for lymphedema clinic through [facility].  Expect a call from [facility] to schedule appointment.

## 2016-09-02 ENCOUNTER — Ambulatory Visit: Payer: Managed Care, Other (non HMO) | Attending: Hematology and Oncology | Admitting: Physical Therapy

## 2016-09-02 DIAGNOSIS — M25612 Stiffness of left shoulder, not elsewhere classified: Secondary | ICD-10-CM | POA: Insufficient documentation

## 2016-09-02 DIAGNOSIS — I890 Lymphedema, not elsewhere classified: Secondary | ICD-10-CM | POA: Diagnosis present

## 2016-09-02 DIAGNOSIS — M25611 Stiffness of right shoulder, not elsewhere classified: Secondary | ICD-10-CM | POA: Diagnosis present

## 2016-09-02 NOTE — Therapy (Signed)
[place], [place], [address] Phone: [phone]   Fax:  [phone]  Physical Therapy Evaluation  Patient Details  Name: Kimberly Reed MRN: [id number] Date of Birth: [birth year] Referring Provider: Dr. Nicholas Lose  Encounter Date: 09/02/2016      PT End of Session - 09/02/16 1709    Visit Number 1   Number of Visits 3   Date for PT Re-Evaluation 10/09/16   PT Start Time [id number]  pt. late a few  minutes   PT Stop Time 1025   PT Time Calculation (min) 47 min   Activity Tolerance Patient limited by lethargy;Treatment limited secondary to agitation   Behavior During Therapy --  appears groggy, keeping eyes closed some of the time      Past Medical History:  Diagnosis Date  . Anemia   . Anxiety   . Atypical chest pain   . Breast cancer ([place]) 05/2014   left  . Cancer ([place])    left breast dx. [id number] chemo planned to start 07-09-14- Dr. Lindi Adie[facility] follows  . Depression   . Family history of heart disease   . Glaucoma   . Glaucoma   . History of syncope 2008   loss of bladder control eval by Neuro  . Hot flashes   . Hyperlipemia   . Hypothyroid   . Insomnia   . Migraine without aura   . Wears glasses     Past Surgical History:  Procedure Laterality Date  . BREAST LUMPECTOMY WITH NEEDLE LOCALIZATION AND AXILLARY LYMPH NODE DISSECTION Left 01/30/2015   Procedure: BREAST LUMPECTOMY WITH NEEDLE LOCALIZATION LEFT AXILLARY DISSECTION REMOVAL OF PORT;  Surgeon: [name], MD;  Location: [facility];  Service: General;  Laterality: Left;  . CATARACT EXTRACTION Left   . DILATION AND CURETTAGE OF UTERUS    . ESOPHAGOGASTRODUODENOSCOPY  06/01/14  . EXAMINATION UNDER ANESTHESIA  02/24/2013   Procedure: EXAM UNDER ANESTHESIA;  Surgeon: Azalia Bilis, MD;  Location: [facility];  Service: Gynecology;;  with removal of prolapsing cervical mass; pap smear and endometrial biopsy  .  GLAUCOMA SURGERY Left    x1   . PORTACATH PLACEMENT Right 07/06/2014   Procedure: PORT A CATH  PLACEMENT;  Surgeon: [name], MD;  Location: [facility];  Service: General;  Laterality: Right;  . TONSILLECTOMY     child  . uterine polyp removed     per hysteroscopy about 1 1/2 yrs ago.    There were no vitals filed for this visit.       Subjective Assessment - 09/02/16 0944    Subjective "I don't feel well any more.  I always feel like this.  It's my head--it's vertigo, disorientation, I feel like I've aged 60 years; my doctors don't have any comment about it."  Left arm feels heavy and feels like a stuffed balloon, so mobility is affected by swelling.   Pertinent History Breast cancer diagnosed in August 2015.  Had neo-adjuvant chemotherapy and left lumpectomy with 9 lymph nodes removed, all negative; radiation completed summer 2016.  Had tried 2 or 3 anti-estrogen pills but couldn't tolerate them.  Glaucoma; hypothyroid.     Patient Stated Goals reduce the left arm swelling   Currently in Pain? No/denies            [facility] PT Assessment - 09/02/16 0001      Assessment   Medical Diagnosis left breast cancer s/p chemo, lumpectomy, and radiation, completed  2016   Referring Provider Dr. Nicholas Lose   Onset Date/Surgical Date --  2015   Hand Dominance Right     Precautions   Precautions Other (comment)   Precaution Comments cancer precautions     Restrictions   Weight Bearing Restrictions No     Balance Screen   Has the patient fallen in the past 6 months No   Has the patient had a decrease in activity level because of a fear of falling?  Yes   Is the patient reluctant to leave their home because of a fear of falling?  Yes     [place] residence   Living Arrangements Alone   Type of [place] Access Level entry     Prior Function   Level of Independence Independent   Vocation Full time employment   Vocation  Requirements customer service, sitting   Leisure before cancer, went to [employer] and had lost weight; has gained weight since chemo.  Says "when I'm not working, I'm in bed" because she feels bad; has tried to ride stationary bike 20-30 minutes, but it is difficult     Cognition   Overall Cognitive Status Impaired/Different from baseline   Behaviors --  appears tired, sleepy, groggy     Observation/Other Assessments   Observations appears sleepy, keeping eyes closed much of the time     ROM / Strength   AROM / PROM / Strength AROM     AROM   AROM Assessment Site Shoulder   Right/Left Shoulder Right;Left   Right Shoulder Flexion 106 Degrees   Right Shoulder ABduction 91 Degrees   Left Shoulder Flexion 105 Degrees   Left Shoulder ABduction 83 Degrees           LYMPHEDEMA/ONCOLOGY QUESTIONNAIRE - 09/02/16 0957      Type   Cancer Type left breast     Surgeries   Lumpectomy Date --  2015   Number Lymph Nodes Removed 9     Treatment   Past Chemotherapy Treatment Yes   Past Radiation Treatment Yes   Date --  summer 2016     What other symptoms do you have   Are you Having Heaviness or Tightness Yes   Are you having pitting edema Yes  very mild   Body Site left arm     Lymphedema Assessments   Lymphedema Assessments Upper extremities     Right Upper Extremity Lymphedema   15 cm Proximal to Olecranon Process 41.3 cm   10 cm Proximal to Olecranon Process 40.5 cm   Olecranon Process 33 cm   15 cm Proximal to Ulnar Styloid Process 31.2 cm   10 cm Proximal to Ulnar Styloid Process 27 cm   Just Proximal to Ulnar Styloid Process 18.8 cm   Across Hand at [employer] 19.5 cm   At [place] of 2nd Digit 6.7 cm     Left Upper Extremity Lymphedema   15 cm Proximal to Olecranon Process 45.1 cm   10 cm Proximal to Olecranon Process 45.9 cm   Olecranon Process 38.5 cm   15 cm Proximal to Ulnar Styloid Process 37 cm   10 cm Proximal to Ulnar Styloid Process 32 cm    Just Proximal to Ulnar Styloid Process 22.4 cm   Across Hand at [employer] 19.6 cm   At [place] of 2nd Digit 6.5 cm  PT Education - 09/02/16 1722    Education provided Yes   Education Details began educating patient about lymphedema being a chronic condition and about treatment options; also gave her names of Velcro garments to look up on the web (Circaid Juxtafit and [place]).  Patient also asked about possible consequences if she did nothing to treat lymphedema and was told it would likely continue to increase in size and about the risk of infection.   Person(s) Educated Patient   Methods Handout;Explanation  lymphedema pamphlet, ABC class handout   Comprehension Need further instruction                Long Term Clinic Goals - 09/02/16 1727      CC Long Term Goal  #1   Title Patient will be knowledgeable about compression garments and other equipment options for self-management of lymphedema.   Time 4   Period Weeks   Status New     CC Long Term Goal  #2   Title Patient will be knowledgeable about how to perform self-manual lymph drainage.   Time 4   Period Weeks   Status New     CC Long Term Goal  #3   Title Patient will be knowledgeable about lymphedema risk reduction practices   Time 4   Period Weeks   Status New            Plan - 09/02/16 1711    Clinical Impression Statement This is a woman who is approx. 2 years s/p treatment for left breast cancer including chemo, lumpectomy (9 nodes removed), and radiation. She reports a gradual onset of swelling in the left UE, which she may not have noticed initially because of weight gain related to her cancer treatment.  Now she has significantly greater circumference measurements in left UE compared to right, so much that she finds it difficult to raise her left arm. In fact, she has limited AROM of both shoulders.  When treatment options were discussed, she could not  consider bandaging because she is unable to be away from work for the frequency of therapy required.  She seemed agreeable to coming to two therapy sessions so that we can educate her as much as possible about self care and garments, though at the end of the session, she seemed upset and reported that she "needed to leave."  She did, however, stay to schedule one appointment.  She has some unusual complaints that she found difficult to verbalize about her head feeling funny; she described it as vertigo, but when questioned, wasn't sure what vertigo is.  She just doesn't feel good.  She had eyes closed through part of the conversation.  She reports she has told two doctors about her head feeling strange, and was referred to a psychiatrist by one, but without satisfaction from either for helping her.   Rehab Potential Fair   Clinical Impairments Affecting Rehab Potential vague complaints of vertigo or not feeling well; difficulty getting off work to attend therapy sessions   PT Frequency Other (comment)  1-2 visits total   PT Duration 4 weeks   PT Treatment/Interventions ADLs/Self Care Home Management;DME Instruction;Patient/family education;Manual techniques;Manual lymph drainage   PT Next Visit Plan Show patient samples of Circaid Juxtafit and [place] as types of Velcro garments that she might get to do self compression; discuss obtaining a daytime and possibly another nighttime sleeve (unless she chooses a Velcro garment for both).  Teach manual lymph drainage.  Discuss availability of pumps if time allows.   Consulted and Agree with Plan of Care Patient      Patient will benefit from skilled therapeutic intervention in order to improve the following deficits and impairments:  Decreased knowledge of precautions, Decreased knowledge of use of DME, Increased edema, Decreased strength, Impaired perceived functional ability  Visit Diagnosis: Lymphedema, not elsewhere classified - Plan: PT plan  of care cert/re-cert  Stiffness of left shoulder, not elsewhere classified - Plan: PT plan of care cert/re-cert  Stiffness of right shoulder, not elsewhere classified - Plan: PT plan of care cert/re-cert     Problem List Patient Active Problem List   Diagnosis Date Noted  . Glaucoma 01/23/2015  . Visit for preventive health examination 01/23/2015  . Chemotherapy induced neutropenia ([place]) 08/02/2014  . Anxiety 07/04/2014  . Anemia due to antineoplastic chemotherapy 06/22/2014  . Depression 06/06/2014  . Breast cancer of upper-outer quadrant of left female breast ([place]) 05/28/2014  . Snoring 04/26/2014  . Persistent disorder of initiating or maintaining sleep 04/26/2014  . Atypical chest pain 03/22/2014  . Medication side effect 02/19/2014  . Adjustment disorder with mixed anxiety and depressed mood 02/19/2014  . Pain of left heel 11/14/2012  . Memory difficulties 11/14/2012  . Stress at work 11/14/2012  . Plantar fasciitis of left foot 11/14/2012  . Neoplasm of uncertain behavior of skin 07/10/2011  . Infection, skin 07/10/2011  . Family hx of aortic aneurysm 03/16/2011  . HYPERLIPIDEMIA 07/24/2008  . OBESITY, UNSPECIFIED 07/24/2008  . DYSTHYMIA, CHRONIC 07/24/2008  . PERIMENOPAUSAL SYNDROME 07/24/2008  . FATIGUE 07/24/2008  . HYPOTHYROIDISM 11/29/2007  . GLUCOMA 11/29/2007  . HEADACHE 11/29/2007    SALISBURY,DONNA 09/02/2016, 5:31 PM  [place], [place], [address] Phone: [phone]   Fax:  [phone]  Name: Remeigh Ditsworth MRN: [id number] Date of Birth: May 24, 1956  Serafina Royals, PT 09/02/16 5:31 PM

## 2016-09-03 ENCOUNTER — Encounter: Payer: Self-pay | Admitting: Physical Therapy

## 2016-09-03 ENCOUNTER — Ambulatory Visit: Payer: Managed Care, Other (non HMO) | Admitting: Physical Therapy

## 2016-09-03 DIAGNOSIS — I890 Lymphedema, not elsewhere classified: Secondary | ICD-10-CM

## 2016-09-03 DIAGNOSIS — M25611 Stiffness of right shoulder, not elsewhere classified: Secondary | ICD-10-CM

## 2016-09-03 DIAGNOSIS — M25612 Stiffness of left shoulder, not elsewhere classified: Secondary | ICD-10-CM

## 2016-09-03 NOTE — Therapy (Signed)
[place], [place], [address] Phone: [phone]   Fax:  [phone]  Physical Therapy Treatment  Patient Details  Name: Kimberly Reed MRN: [id number] Date of Birth: 16-Sep-1956 Referring Provider: Dr. Nicholas Lose  Encounter Date: 09/03/2016      PT End of Session - 09/03/16 1251    Visit Number 2   Number of Visits 3   Date for PT Re-Evaluation 10/09/16   PT Start Time 0930   PT Stop Time 1024   PT Time Calculation (min) 54 min   Activity Tolerance Patient tolerated treatment well   Behavior During Therapy Anxious  Somewhat anxious but was able to tolerate treatment      Past Medical History:  Diagnosis Date  . Anemia   . Anxiety   . Atypical chest pain   . Breast cancer ([place]) 05/2014   left  . Cancer ([place])    left breast dx. [id number] chemo planned to start 07-09-14- Dr. Lindi Adie, [facility] follows  . Depression   . Family history of heart disease   . Glaucoma   . Glaucoma   . History of syncope 2008   loss of bladder control eval by Neuro  . Hot flashes   . Hyperlipemia   . Hypothyroid   . Insomnia   . Migraine without aura   . Wears glasses     Past Surgical History:  Procedure Laterality Date  . BREAST LUMPECTOMY WITH NEEDLE LOCALIZATION AND AXILLARY LYMPH NODE DISSECTION Left 01/30/2015   Procedure: BREAST LUMPECTOMY WITH NEEDLE LOCALIZATION LEFT AXILLARY DISSECTION REMOVAL OF PORT;  Surgeon: [name], MD;  Location: [facility];  Service: General;  Laterality: Left;  . CATARACT EXTRACTION Left   . DILATION AND CURETTAGE OF UTERUS    . ESOPHAGOGASTRODUODENOSCOPY  06/01/14  . EXAMINATION UNDER ANESTHESIA  02/24/2013   Procedure: EXAM UNDER ANESTHESIA;  Surgeon: Azalia Bilis, MD;  Location: [facility];  Service: Gynecology;;  with removal of prolapsing cervical mass; pap smear and endometrial biopsy  . GLAUCOMA SURGERY Left    x1   . PORTACATH PLACEMENT Right  07/06/2014   Procedure: PORT A CATH  PLACEMENT;  Surgeon: [name], MD;  Location: [facility];  Service: General;  Laterality: Right;  . TONSILLECTOMY     child  . uterine polyp removed     per hysteroscopy about 1 1/2 yrs ago.    There were no vitals filed for this visit.      Subjective Assessment - 09/03/16 0939    Subjective My arm is feeling like it's burning.  I can't come here much because I have to work.  My job is fast paced and I have to work.  Some soreness.   Pertinent History Breast cancer diagnosed in August 2015.  Had neo-adjuvant chemotherapy and left lumpectomy with 9 lymph nodes removed, all negative; radiation completed summer 2016.  Had tried 2 or 3 anti-estrogen pills but couldn't tolerate them.  Glaucoma; hypothyroid.     Patient Stated Goals reduce the left arm swelling   Currently in Pain? Yes   Pain Score 3    Pain Location Arm   Pain Orientation Left;Lower   Pain Descriptors / Indicators Sore   Pain Type Chronic pain   Pain Onset More than a month ago   Pain Frequency Constant               LYMPHEDEMA/ONCOLOGY QUESTIONNAIRE - 09/02/16 [id number]  Type   Cancer Type left breast     Surgeries   Lumpectomy Date --  2015   Number Lymph Nodes Removed 9     Treatment   Past Chemotherapy Treatment Yes   Past Radiation Treatment Yes   Date --  summer 2016     What other symptoms do you have   Are you Having Heaviness or Tightness Yes   Are you having pitting edema Yes  very mild   Body Site left arm     Lymphedema Assessments   Lymphedema Assessments Upper extremities     Right Upper Extremity Lymphedema   15 cm Proximal to Olecranon Process 41.3 cm   10 cm Proximal to Olecranon Process 40.5 cm   Olecranon Process 33 cm   15 cm Proximal to Ulnar Styloid Process 31.2 cm   10 cm Proximal to Ulnar Styloid Process 27 cm   Just Proximal to Ulnar Styloid Process 18.8 cm   Across Hand at [employer] 19.5 cm   At [place] of 2nd Digit 6.7  cm     Left Upper Extremity Lymphedema   15 cm Proximal to Olecranon Process 45.1 cm   10 cm Proximal to Olecranon Process 45.9 cm   Olecranon Process 38.5 cm   15 cm Proximal to Ulnar Styloid Process 37 cm   10 cm Proximal to Ulnar Styloid Process 32 cm   Just Proximal to Ulnar Styloid Process 22.4 cm   Across Hand at [employer] 19.6 cm   At [place] of 2nd Digit 6.5 cm                  [facility] Adult PT Treatment/Exercise - 09/03/16 0001      Manual Therapy   Manual Therapy Manual Lymphatic Drainage (MLD)   Manual Lymphatic Drainage (MLD) Manual lymph drainage to left arm in supine: short neck, right axilla and left inguinal region; anterior inter-axillary and left axillo-inguinal pathway, left UE from dorsal hand to shoulder redirecting along pathways.                PT Education - 09/03/16 1239    Education provided Yes   Education Details Educated pt on importance of returning to [employer] and how it is possible her physical symptoms (as medical issues have been ruled out) may be more related to her anxiety.  Discussed possibility of her seeing a therapist for that as her oncologist referred her to a psychiatrist.  She said she does not want to see a psychiatrist but would see a counselor.  I gave her the name of someone who speicalizes in anxiety and she said she would contact her.   Person(s) Educated Patient   Methods Explanation   Comprehension Verbalized understanding                [facility] Goals - 09/02/16 1727      CC Long Term Goal  #1   Title Patient will be knowledgeable about compression garments and other equipment options for self-management of lymphedema.   Time 4   Period Weeks   Status New     CC Long Term Goal  #2   Title Patient will be knowledgeable about how to perform self-manual lymph drainage.   Time 4   Period Weeks   Status New     CC Long Term Goal  #3   Title Patient will be knowledgeable about  lymphedema risk reduction practices   Time 4  Period Weeks   Status New            Plan - 09/03/16 1252    Clinical Impression Statement Patient appears to be very anxious about life in general and reports feeling much more anxious since being diagnosed with cancer 2 years ago.  She stated feeling like something is wrong with her but when questioned if medical problems have been ruled out, she admitted they have and then asked if I think it's in her head.  We discussed how anxiety and other mental health issues can create physical symptoms. She asked if I knew of anyone who she could talk to so I gave her the name of a therapist that specializes in anxiety.  I also encouraged her to return to [employer] to begin exercising again and explained about exercise whould reduce her c/o fatigue (maybe not initially) but long term.  I encouraged her to begin attending breast cnacer support group as she reports having no family or friends to offer her support.  She agreed to do all of those things so we will focus on her swelling.  She agreed to come to therapy 2x/week and haev a Flexitouch pump demo.  We will continue manual lymph drainage, assist her in getting a compression sleeve and explore whether a night time garment would be covered by insurance.   Rehab Potential Fair   Clinical Impairments Affecting Rehab Potential vague complaints of vertigo or not feeling well; difficulty getting off work to attend therapy sessions   PT Frequency 2x / week   PT Duration 4 weeks   PT Treatment/Interventions ADLs/Self Care Home Management;DME Instruction;Patient/family education;Manual techniques;Manual lymph drainage   PT Next Visit Plan Educate patient on how to obtain daytime comrpession garments and possibly nighttime compression.  Continue manual lymph drainage. Question if she has gone to the gym, called for a counseling appointment, or looked into breast cancer support group.   Consulted and Agree with  Plan of Care Patient      Patient will benefit from skilled therapeutic intervention in order to improve the following deficits and impairments:  Decreased knowledge of precautions, Decreased knowledge of use of DME, Increased edema, Decreased strength, Impaired perceived functional ability  Visit Diagnosis: Lymphedema, not elsewhere classified  Stiffness of left shoulder, not elsewhere classified  Stiffness of right shoulder, not elsewhere classified     Problem List Patient Active Problem List   Diagnosis Date Noted  . Glaucoma 01/23/2015  . Visit for preventive health examination 01/23/2015  . Chemotherapy induced neutropenia ([place]) 08/02/2014  . Anxiety 07/04/2014  . Anemia due to antineoplastic chemotherapy 06/22/2014  . Depression 06/06/2014  . Breast cancer of upper-outer quadrant of left female breast ([place]) 05/28/2014  . Snoring 04/26/2014  . Persistent disorder of initiating or maintaining sleep 04/26/2014  . Atypical chest pain 03/22/2014  . Medication side effect 02/19/2014  . Adjustment disorder with mixed anxiety and depressed mood 02/19/2014  . Pain of left heel 11/14/2012  . Memory difficulties 11/14/2012  . Stress at work 11/14/2012  . Plantar fasciitis of left foot 11/14/2012  . Neoplasm of uncertain behavior of skin 07/10/2011  . Infection, skin 07/10/2011  . Family hx of aortic aneurysm 03/16/2011  . HYPERLIPIDEMIA 07/24/2008  . OBESITY, UNSPECIFIED 07/24/2008  . DYSTHYMIA, CHRONIC 07/24/2008  . PERIMENOPAUSAL SYNDROME 07/24/2008  . FATIGUE 07/24/2008  . HYPOTHYROIDISM 11/29/2007  . GLUCOMA 11/29/2007  . HEADACHE 11/29/2007   Annia Friendly, PT 09/03/16 1:01 PM  [facility]  [place], [place], [address] Phone: [phone]   Fax:  [phone]  Name: Kimberly Reed MRN: [id number] Date of Birth: [birth year]

## 2016-09-04 ENCOUNTER — Telehealth: Payer: Self-pay | Admitting: Hematology and Oncology

## 2016-09-04 NOTE — Telephone Encounter (Signed)
I was looking ahead through Dr. Geralyn Flash schedule and came across [name] appt. It had been changed by Judithann Graves at [facility] clinic from 2pm to 4pm. Notified [name] regarding the error. She switched it back to the pt's original time.

## 2016-09-07 ENCOUNTER — Encounter: Payer: Self-pay | Admitting: Physical Therapy

## 2016-09-07 ENCOUNTER — Ambulatory Visit: Payer: Managed Care, Other (non HMO) | Admitting: Physical Therapy

## 2016-09-07 DIAGNOSIS — I890 Lymphedema, not elsewhere classified: Secondary | ICD-10-CM | POA: Diagnosis not present

## 2016-09-07 DIAGNOSIS — M25612 Stiffness of left shoulder, not elsewhere classified: Secondary | ICD-10-CM

## 2016-09-07 NOTE — Therapy (Signed)
[place], [place], [address] Phone: [phone]   Fax:  [phone]  Physical Therapy Treatment  Patient Details  Name: Kimberly Reed MRN: [id number] Date of Birth: September 14, 1956 Referring Provider: Dr. Nicholas Lose  Encounter Date: 09/07/2016      PT End of Session - 09/07/16 1654    Visit Number 3   Number of Visits 3   Date for PT Re-Evaluation 10/09/16   PT Start Time 1610   PT Stop Time 1654   PT Time Calculation (min) 44 min   Activity Tolerance Patient tolerated treatment well   Behavior During Therapy [facility] for tasks assessed/performed  Less anxious today      Past Medical History:  Diagnosis Date  . Anemia   . Anxiety   . Atypical chest pain   . Breast cancer ([place]) 05/2014   left  . Cancer ([place])    left breast dx. [id number] chemo planned to start 07-09-14- Dr. Lindi Adie, [facility] follows  . Depression   . Family history of heart disease   . Glaucoma   . Glaucoma   . History of syncope 2008   loss of bladder control eval by Neuro  . Hot flashes   . Hyperlipemia   . Hypothyroid   . Insomnia   . Migraine without aura   . Wears glasses     Past Surgical History:  Procedure Laterality Date  . BREAST LUMPECTOMY WITH NEEDLE LOCALIZATION AND AXILLARY LYMPH NODE DISSECTION Left 01/30/2015   Procedure: BREAST LUMPECTOMY WITH NEEDLE LOCALIZATION LEFT AXILLARY DISSECTION REMOVAL OF PORT;  Surgeon: [name], MD;  Location: [place];  Service: General;  Laterality: Left;  . CATARACT EXTRACTION Left   . DILATION AND CURETTAGE OF UTERUS    . ESOPHAGOGASTRODUODENOSCOPY  06/01/14  . EXAMINATION UNDER ANESTHESIA  02/24/2013   Procedure: EXAM UNDER ANESTHESIA;  Surgeon: Azalia Bilis, MD;  Location: [facility];  Service: Gynecology;;  with removal of prolapsing cervical mass; pap smear and endometrial biopsy  . GLAUCOMA SURGERY Left    x1   . PORTACATH PLACEMENT Right 07/06/2014    Procedure: PORT A CATH  PLACEMENT;  Surgeon: [name], MD;  [facility];  Service: General;  Laterality: Right;  . TONSILLECTOMY     child  . uterine polyp removed     per hysteroscopy about 1 1/2 yrs ago.    There were no vitals filed for this visit.      Subjective Assessment - 09/07/16 1614    Subjective I'm running late due to traffic. I have some sick days I can use for therapy. I want to get the pump. (Pt. was informed Flexitouch is potentially a covered benefit and they are in-network).   Pertinent History Breast cancer diagnosed in August 2015.  Had neo-adjuvant chemotherapy and left lumpectomy with 9 lymph nodes removed, all negative; radiation completed summer 2016.  Had tried 2 or 3 anti-estrogen pills but couldn't tolerate them.  Glaucoma; hypothyroid.     Patient Stated Goals reduce the left arm swelling   Currently in Pain? No/denies                         [facility] Adult PT Treatment/Exercise - 09/07/16 0001      Manual Therapy   Manual Therapy Manual Lymphatic Drainage (MLD)   Manual Lymphatic Drainage (MLD) Manual lymph drainage to left arm in supine: short neck, right axilla and left  inguinal region; anterior inter-axillary and left axillo-inguinal pathway, left UE from dorsal hand to shoulder redirecting along pathways.                PT Education - 09/07/16 1653    Education provided Yes   Education Details Showed patient nighttime garments and discussed doing a Brewing technologist and also working with Rexford Maus to measure her for daytime and nighttime compression garments.  She agreed to all of that.   Person(s) Educated Patient   Methods Explanation;Demonstration   Comprehension Verbalized understanding                Long Term Clinic Goals - 09/02/16 1727      CC Long Term Goal  #1   Title Patient will be knowledgeable about compression garments and other equipment options for self-management of  lymphedema.   Time 4   Period Weeks   Status New     CC Long Term Goal  #2   Title Patient will be knowledgeable about how to perform self-manual lymph drainage.   Time 4   Period Weeks   Status New     CC Long Term Goal  #3   Title Patient will be knowledgeable about lymphedema risk reduction practices   Time 4   Period Weeks   Status New            Plan - 09/07/16 1655    Clinical Impression Statement Patient agreed to fitting for compression sleeve and glove, night time garment, and Flexitouch demo.  She is going to [employer] and trying to turn things around.  She continues to be somewhat anxious in PT but was better today.  left arm continues to be very swollen but is unable to do bandaging as she can only come to PT 1x/week due to work.   Rehab Potential Good   Clinical Impairments Affecting Rehab Potential vague complaints of vertigo or not feeling well; difficulty getting off work to attend therapy sessions   PT Frequency 1x / week   PT Duration 4 weeks   PT Treatment/Interventions ADLs/Self Care Home Management;DME Instruction;Patient/family education;Manual techniques;Manual lymph drainage   PT Next Visit Plan Continue manual lymph drainage and check progress on getting measured for garments (demographics faxed to carolyn Sheppard [employer] today) and [employer].   Consulted and Agree with Plan of Care Patient      Patient will benefit from skilled therapeutic intervention in order to improve the following deficits and impairments:  Decreased knowledge of precautions, Decreased knowledge of use of DME, Increased edema, Decreased strength, Impaired perceived functional ability  Visit Diagnosis: Lymphedema, not elsewhere classified  Stiffness of left shoulder, not elsewhere classified     Problem List Patient Active Problem List   Diagnosis Date Noted  . Glaucoma 01/23/2015  . Visit for preventive health examination 01/23/2015  . Chemotherapy induced  neutropenia ([place]) 08/02/2014  . Anxiety 07/04/2014  . Anemia due to antineoplastic chemotherapy 06/22/2014  . Depression 06/06/2014  . Breast cancer of upper-outer quadrant of left female breast ([place]) 05/28/2014  . Snoring 04/26/2014  . Persistent disorder of initiating or maintaining sleep 04/26/2014  . Atypical chest pain 03/22/2014  . Medication side effect 02/19/2014  . Adjustment disorder with mixed anxiety and depressed mood 02/19/2014  . Pain of left heel 11/14/2012  . Memory difficulties 11/14/2012  . Stress at work 11/14/2012  . Plantar fasciitis of left foot 11/14/2012  . Neoplasm of uncertain behavior of skin 07/10/2011  . Infection,  skin 07/10/2011  . Family hx of aortic aneurysm 03/16/2011  . HYPERLIPIDEMIA 07/24/2008  . OBESITY, UNSPECIFIED 07/24/2008  . DYSTHYMIA, CHRONIC 07/24/2008  . PERIMENOPAUSAL SYNDROME 07/24/2008  . FATIGUE 07/24/2008  . HYPOTHYROIDISM 11/29/2007  . GLUCOMA 11/29/2007  . HEADACHE 11/29/2007   Annia Friendly, PT 09/07/16 5:06 PM  [place], [place], [address] Phone: [phone]   Fax:  [phone]  Name: Mariajose Neitz MRN: [id number] Date of Birth: 1956/01/21

## 2016-09-10 LAB — BASIC METABOLIC PANEL
BUN: 14 (ref 4–21)
BUN: 14 (ref 4–21)
CREATININE: 0.8 (ref 0.5–1.1)
CREATININE: 0.8 (ref 0.5–1.1)
Glucose: 106
Glucose: 106
POTASSIUM: 4 (ref 3.4–5.3)
Potassium: 4 (ref 3.4–5.3)
SODIUM: 141 (ref 137–147)
Sodium: 141 (ref 137–147)

## 2016-09-10 LAB — CBC AND DIFFERENTIAL
HCT: 38 (ref 36–46)
HEMATOCRIT: 38 (ref 36–46)
HEMOGLOBIN: 12.8 (ref 12.0–16.0)
Hemoglobin: 12.8 (ref 12.0–16.0)
Neutrophils Absolute: 2
PLATELETS: 297 (ref 150–399)
PLATELETS: 287 (ref 150–399)
WBC: 4.2
WBC: 4.2

## 2016-09-10 LAB — HEPATIC FUNCTION PANEL
ALT: 17 (ref 7–35)
ALT: 17 (ref 7–35)
AST: 17 (ref 13–35)
AST: 17 (ref 13–35)

## 2016-09-10 LAB — TSH: TSH: 3.56 (ref 0.41–5.90)

## 2016-09-15 ENCOUNTER — Ambulatory Visit: Payer: Managed Care, Other (non HMO) | Admitting: Physical Therapy

## 2016-09-15 DIAGNOSIS — I890 Lymphedema, not elsewhere classified: Secondary | ICD-10-CM

## 2016-09-15 NOTE — Therapy (Signed)
[place], [place], [address] Phone: [phone]   Fax:  [phone]  Patient Details  Name: Kimberly Reed MRN: [id number] Date of Birth: 05/06/1956 Referring Provider:  Nicholas Lose, MD  Encounter Date: 09/15/2016  Pt came to clinic today for Flexitouch trial and did not receive a treatment.  She said that she has not gotten her garments measured yet. She does not want to talk much as she doesn't feel well.  She does not want to discharge from this episode yet, but wants to come next week and will discuss it at that time.  Norwood Levo 09/15/2016, 4:59 PM  [place], [place], [address] Phone: [phone]   Fax:  [phone]

## 2016-09-16 ENCOUNTER — Ambulatory Visit: Payer: Managed Care, Other (non HMO) | Admitting: Neurology

## 2016-09-22 ENCOUNTER — Ambulatory Visit: Payer: Managed Care, Other (non HMO) | Admitting: Physical Therapy

## 2016-09-22 ENCOUNTER — Telehealth: Payer: Self-pay | Admitting: Hematology and Oncology

## 2016-09-22 ENCOUNTER — Encounter: Payer: Self-pay | Admitting: Physical Therapy

## 2016-09-22 DIAGNOSIS — I890 Lymphedema, not elsewhere classified: Secondary | ICD-10-CM

## 2016-09-22 NOTE — Therapy (Signed)
[place], [place], [address] Phone: [phone]   Fax:  [phone]  Physical Therapy Treatment  Patient Details  Name: Kimberly Reed MRN: [id number] Date of Birth: 05-12-1956 Referring Provider: Dr. Nicholas Lose  Encounter Date: 09/22/2016      PT End of Session - 09/22/16 1400    Visit Number 4   Number of Visits 5   Date for PT Re-Evaluation 10/09/16   PT Start Time 1304   PT Stop Time [id number]   PT Time Calculation (min) 43 min   Activity Tolerance Patient tolerated treatment well   Behavior During Therapy [facility] for tasks assessed/performed      Past Medical History:  Diagnosis Date  . Anemia   . Anxiety   . Atypical chest pain   . Breast cancer ([place]) 05/2014   left  . Cancer ([place])    left breast dx. [id number] chemo planned to start 07-09-14- Dr. Lindi Adie, [facility] follows  . Depression   . Family history of heart disease   . Glaucoma   . Glaucoma   . History of syncope 2008   loss of bladder control eval by Neuro  . Hot flashes   . Hyperlipemia   . Hypothyroid   . Insomnia   . Migraine without aura   . Wears glasses     Past Surgical History:  Procedure Laterality Date  . BREAST LUMPECTOMY WITH NEEDLE LOCALIZATION AND AXILLARY LYMPH NODE DISSECTION Left 01/30/2015   Procedure: BREAST LUMPECTOMY WITH NEEDLE LOCALIZATION LEFT AXILLARY DISSECTION REMOVAL OF PORT;  Surgeon: [name], MD;  Location: [place];  Service: General;  Laterality: Left;  . CATARACT EXTRACTION Left   . DILATION AND CURETTAGE OF UTERUS    . ESOPHAGOGASTRODUODENOSCOPY  06/01/14  . EXAMINATION UNDER ANESTHESIA  02/24/2013   Procedure: EXAM UNDER ANESTHESIA;  Surgeon: Azalia Bilis, MD;  Location: [facility];  Service: Gynecology;;  with removal of prolapsing cervical mass; pap smear and endometrial biopsy  . GLAUCOMA SURGERY Left    x1   . PORTACATH PLACEMENT Right 07/06/2014   Procedure: PORT A  CATH  PLACEMENT;  Surgeon: [name], MD;  [facility];  Service: General;  Laterality: Right;  . TONSILLECTOMY     child  . uterine polyp removed     per hysteroscopy about 1 1/2 yrs ago.    There were no vitals filed for this visit.      Subjective Assessment - 09/22/16 1308    Subjective I heard from [place] about my sleeve. I haven't called her back yet. I think I might need to put it on my insurance for next year.    Pertinent History Breast cancer diagnosed in August 2015.  Had neo-adjuvant chemotherapy and left lumpectomy with 9 lymph nodes removed, all negative; radiation completed summer 2016.  Had tried 2 or 3 anti-estrogen pills but couldn't tolerate them.  Glaucoma; hypothyroid.     Patient Stated Goals reduce the left arm swelling   Currently in Pain? No/denies   Pain Score 0-No pain                                 PT Education - 09/22/16 1357    Education provided Yes   Education Details importance of day and nighttime garments and what they are used for, importance of following up with Hoyle Sauer to get measured for garments  Person(s) Educated Patient   Methods Explanation   Comprehension Verbalized understanding                [facility] Goals - 09/22/16 1357      CC Long Term Goal  #1   Title Patient will be knowledgeable about compression garments and other equipment options for self-management of lymphedema.   Baseline 09/22/16- ongoing education regarding this   Time 4   Status On-going     CC Long Term Goal  #2   Title Patient will be knowledgeable about how to perform self-manual lymph drainage.   Baseline 09/22/16- will begin next session   Time 4   Period Weeks   Status On-going     CC Long Term Goal  #3   Title Patient will be knowledgeable about lymphedema risk reduction practices   Time 4   Period Weeks   Status On-going            Plan - 09/22/16 1401    Clinical Impression  Statement Patient very agreeable to treatment today and agreeable to coming to additional sessions. She was educated about importance of following up with Rexford Maus about her compression garments. Completed MLD to pt's LUE today. Contacted [employer]- she should be receiving the pump next week.    Rehab Potential Good   Clinical Impairments Affecting Rehab Potential vague complaints of vertigo or not feeling well; difficulty getting off work to attend therapy sessions   PT Frequency 1x / week   PT Duration 4 weeks   PT Treatment/Interventions ADLs/Self Care Home Management;DME Instruction;Patient/family education;Manual techniques;Manual lymph drainage   PT Next Visit Plan Continue manual lymph drainage and check progress on getting measured for garments (demographics faxed to carolyn Shepp[employer] today) and [employer].   Consulted and Agree with Plan of Care Patient      Patient will benefit from skilled therapeutic intervention in order to improve the following deficits and impairments:  Decreased knowledge of precautions, Decreased knowledge of use of DME, Increased edema, Decreased strength, Impaired perceived functional ability  Visit Diagnosis: Lymphedema, not elsewhere classified     Problem List Patient Active Problem List   Diagnosis Date Noted  . Glaucoma 01/23/2015  . Visit for preventive health examination 01/23/2015  . Chemotherapy induced neutropenia ([place]) 08/02/2014  . Anxiety 07/04/2014  . Anemia due to antineoplastic chemotherapy 06/22/2014  . Depression 06/06/2014  . Breast cancer of upper-outer quadrant of left female breast ([place]) 05/28/2014  . Snoring 04/26/2014  . Persistent disorder of initiating or maintaining sleep 04/26/2014  . Atypical chest pain 03/22/2014  . Medication side effect 02/19/2014  . Adjustment disorder with mixed anxiety and depressed mood 02/19/2014  . Pain of left heel 11/14/2012  . Memory difficulties 11/14/2012  . Stress at  work 11/14/2012  . Plantar fasciitis of left foot 11/14/2012  . Neoplasm of uncertain behavior of skin 07/10/2011  . Infection, skin 07/10/2011  . Family hx of aortic aneurysm 03/16/2011  . HYPERLIPIDEMIA 07/24/2008  . OBESITY, UNSPECIFIED 07/24/2008  . DYSTHYMIA, CHRONIC 07/24/2008  . PERIMENOPAUSAL SYNDROME 07/24/2008  . FATIGUE 07/24/2008  . HYPOTHYROIDISM 11/29/2007  . GLUCOMA 11/29/2007  . HEADACHE 11/29/2007    Kimberly Reed 09/22/2016, 2:12 PM  [place], [place], [address] Phone: [phone]   Fax:  [phone]  Name: Kimberly Reed MRN: [id number] Date of Birth: [birth year]  Allyson Sabal, PT 09/22/16 2:13 PM

## 2016-09-22 NOTE — Telephone Encounter (Signed)
Appointment canceled per patient request. Patient does not wish to reschedule at this time.

## 2016-09-30 ENCOUNTER — Ambulatory Visit: Payer: Managed Care, Other (non HMO) | Attending: Hematology and Oncology

## 2016-09-30 DIAGNOSIS — I890 Lymphedema, not elsewhere classified: Secondary | ICD-10-CM | POA: Insufficient documentation

## 2016-09-30 DIAGNOSIS — M25612 Stiffness of left shoulder, not elsewhere classified: Secondary | ICD-10-CM | POA: Insufficient documentation

## 2016-09-30 DIAGNOSIS — M25611 Stiffness of right shoulder, not elsewhere classified: Secondary | ICD-10-CM | POA: Diagnosis present

## 2016-09-30 NOTE — Therapy (Signed)
[place], [place], [address] Phone: [phone]   Fax:  [phone]  Physical Therapy Treatment  Patient Details  Name: Kimberly Reed MRN: [id number] Date of Birth: 04-26-1956 Referring Provider: Dr. Nicholas Lose  Encounter Date: 09/30/2016      PT End of Session - 09/30/16 1517    Visit Number 5   Number of Visits 5   Date for PT Re-Evaluation 10/09/16   PT Start Time [id number]   PT Stop Time 1517   PT Time Calculation (min) 72 min   Activity Tolerance Patient tolerated treatment well   Behavior During Therapy [facility] for tasks assessed/performed      Past Medical History:  Diagnosis Date  . Anemia   . Anxiety   . Atypical chest pain   . Breast cancer ([place]) 05/2014   left  . Cancer ([place])    left breast dx. [id number] chemo planned to start 07-09-14- Dr. Lindi Adie, [facility] follows  . Depression   . Family history of heart disease   . Glaucoma   . Glaucoma   . History of syncope 2008   loss of bladder control eval by Neuro  . Hot flashes   . Hyperlipemia   . Hypothyroid   . Insomnia   . Migraine without aura   . Wears glasses     Past Surgical History:  Procedure Laterality Date  . BREAST LUMPECTOMY WITH NEEDLE LOCALIZATION AND AXILLARY LYMPH NODE DISSECTION Left 01/30/2015   Procedure: BREAST LUMPECTOMY WITH NEEDLE LOCALIZATION LEFT AXILLARY DISSECTION REMOVAL OF PORT;  Surgeon: [name], MD;  Location: [facility];  Service: General;  Laterality: Left;  . CATARACT EXTRACTION Left   . DILATION AND CURETTAGE OF UTERUS    . ESOPHAGOGASTRODUODENOSCOPY  06/01/14  . EXAMINATION UNDER ANESTHESIA  02/24/2013   Procedure: EXAM UNDER ANESTHESIA;  Surgeon: Azalia Bilis, MD;  Location: [facility];  Service: Gynecology;;  with removal of prolapsing cervical mass; pap smear and endometrial biopsy  . GLAUCOMA SURGERY Left    x1   . PORTACATH PLACEMENT Right 07/06/2014   Procedure: PORT A  CATH  PLACEMENT;  Surgeon: [name], MD;  [facility];  Service: General;  Laterality: Right;  . TONSILLECTOMY     child  . uterine polyp removed     per hysteroscopy about 1 1/2 yrs ago.    There were no vitals filed for this visit.      Subjective Assessment - 09/30/16 1442    Subjective Just got measured for compression garments but I'm still on the fence about them. I like the Flexitouch but it's going to be $1600 out of pocket for me! So I just don't know. I did find a therapist to talk to that I really liked. Had my first appt on Monday.   Pertinent History Breast cancer diagnosed in August 2015.  Had neo-adjuvant chemotherapy and left lumpectomy with 9 lymph nodes removed, all negative; radiation completed summer 2016.  Had tried 2 or 3 anti-estrogen pills but couldn't tolerate them.  Glaucoma; hypothyroid.     Patient Stated Goals reduce the left arm swelling   Currently in Pain? No/denies                         [facility] Adult PT Treatment/Exercise - 09/30/16 0001      Self-Care   Other Self-Care Comments  At fitters request therapist was present for 30 minutes when  pt was being fitted for garment to answer her questions regarding Flexitouch and compression garments and why she needs these. Pt was very anxious due to feeling overwhelmed with decisions of getting all these things especially due to cost (Flexitouch will be $1600 out of pocket for her). Pt felt better after therapist spent time with her answering all her questions and she was willing to learn self manual lymph drainage today during session as well after she realized the importance of this.      Manual Therapy   Manual Therapy Manual Lymphatic Drainage (MLD)   Manual Lymphatic Drainage (MLD) Manual lymph drainage to left arm in supine: short neck, superficial and deep abdominals, right axilla and left inguinal region; anterior inter-axillary and left axillo-inguinal pathway, left UE from  dorsal hand to shoulder redirecting along pathways instructing pt throughout.                PT Education - 09/30/16 1516    Education provided Yes   Education Details Self manual lymph drainage   Person(s) Educated Patient   Methods Explanation;Demonstration;Handout   Comprehension Verbalized understanding;Need further instruction                [facility] Goals - 09/22/16 1357      CC Long Term Goal  #1   Title Patient will be knowledgeable about compression garments and other equipment options for self-management of lymphedema.   Baseline 09/22/16- ongoing education regarding this   Time 4   Status On-going     CC Long Term Goal  #2   Title Patient will be knowledgeable about how to perform self-manual lymph drainage.   Baseline 09/22/16- will begin next session   Time 4   Period Weeks   Status On-going     CC Long Term Goal  #3   Title Patient will be knowledgeable about lymphedema risk reduction practices   Time 4   Period Weeks   Status On-going            Plan - 09/30/16 1739    Clinical Impression Statement Pt was measured for compression garments today and though she was anxious at beginning of being measured she did bette once therapist answered all her questions. During PT session immediately after pt was willing to learn self manual lymph drainage and did well with verbalizing correct sequence and basic anatomy of lymphatic system.    Rehab Potential Good   Clinical Impairments Affecting Rehab Potential vague complaints of vertigo or not feeling well; difficulty getting off work to attend therapy sessions   PT Frequency 1x / week   PT Duration 4 weeks   PT Treatment/Interventions ADLs/Self Care Home Management;DME Instruction;Patient/family education;Manual techniques;Manual lymph drainage   PT Next Visit Plan Renewal at next visit. Continue manual lymph drainage and review with pt. Remeasure circumference. See if pt decided on  compression pump and garments and see if she has anymore questions regarding either.    Consulted and Agree with Plan of Care Patient      Patient will benefit from skilled therapeutic intervention in order to improve the following deficits and impairments:  Decreased knowledge of precautions, Decreased knowledge of use of DME, Increased edema, Decreased strength, Impaired perceived functional ability  Visit Diagnosis: Lymphedema, not elsewhere classified     Problem List Patient Active Problem List   Diagnosis Date Noted  . Glaucoma 01/23/2015  . Visit for preventive health examination 01/23/2015  . Chemotherapy induced neutropenia ([place]) 08/02/2014  .  Anxiety 07/04/2014  . Anemia due to antineoplastic chemotherapy 06/22/2014  . Depression 06/06/2014  . Breast cancer of upper-outer quadrant of left female breast ([place]) 05/28/2014  . Snoring 04/26/2014  . Persistent disorder of initiating or maintaining sleep 04/26/2014  . Atypical chest pain 03/22/2014  . Medication side effect 02/19/2014  . Adjustment disorder with mixed anxiety and depressed mood 02/19/2014  . Pain of left heel 11/14/2012  . Memory difficulties 11/14/2012  . Stress at work 11/14/2012  . Plantar fasciitis of left foot 11/14/2012  . Neoplasm of uncertain behavior of skin 07/10/2011  . Infection, skin 07/10/2011  . Family hx of aortic aneurysm 03/16/2011  . HYPERLIPIDEMIA 07/24/2008  . OBESITY, UNSPECIFIED 07/24/2008  . DYSTHYMIA, CHRONIC 07/24/2008  . PERIMENOPAUSAL SYNDROME 07/24/2008  . FATIGUE 07/24/2008  . HYPOTHYROIDISM 11/29/2007  . GLUCOMA 11/29/2007  . HEADACHE 11/29/2007    Otelia Limes, PTA 09/30/2016, 5:44 PM  [place], [place], [address] Phone: [phone]   Fax:  [phone]  Name: Kimberly Reed MRN: [id number] Date of Birth: [birth year]

## 2016-09-30 NOTE — Patient Instructions (Addendum)
Start with circles near neck above collarbones 5-10 times.     Cancer Rehab [phone] Deep Effective Breath   Standing, sitting, or laying down, place both hands on the belly. Take a deep breath IN, expanding the belly; then breath OUT, contracting the belly. Repeat __5__ times. Do __2-3__ sessions per day and before your self massage.  http://gt2.exer.us/866   Copyright  VHI. All rights reserved.  Axilla to Axilla - Sweep   On uninvolved side make 5 circles in the armpit, then pump _5__ times from involved armpit across chest to uninvolved armpit, making a pathway. Do _1__ time per day.  Copyright  VHI. All rights reserved.  Axilla to Inguinal Nodes - Sweep   On involved side, make 5 circles at groin at panty line, then pump _5__ times from armpit along side of trunk to outer hip, making your other pathway. Do __1_ time per day.  Copyright  VHI. All rights reserved.  Arm Posterior: Elbow to Shoulder - Sweep   Pump _5__ times from back of elbow to top of shoulder. Then inner to outer upper arm _5_ times, then outer arm again _5_ times. Then back to the pathways _2-3_ times. Do _1__ time per day.  Copyright  VHI. All rights reserved.  ARM: [place] Wrist to Elbow - Sweep   Pump or stationary circles _5__ times from wrist to elbow making sure to do both sides of the forearm. Then retrace your steps to the outer arm, and the pathways _2-3_ times each. Do _1__ time per day.  Copyright  VHI. All rights reserved.  ARM: Dorsum of Hand to Shoulder - Sweep   Pump or stationary circles _5__ times on back of hand including knuckle spaces and individual fingers if needed working up towards the wrist, then retrace all your steps working back up the forearm, doing both sides; upper outer arm and back to your pathways _2-3_ times each. Then do 5 circles again at uninvolved armpit and involved groin where you started! Good job!! Do __1_ time per day.  Copyright  VHI. All rights  reserved.

## 2016-10-01 ENCOUNTER — Ambulatory Visit: Payer: Managed Care, Other (non HMO) | Admitting: Physical Therapy

## 2016-10-06 ENCOUNTER — Ambulatory Visit: Payer: Managed Care, Other (non HMO) | Admitting: Hematology and Oncology

## 2016-10-07 ENCOUNTER — Ambulatory Visit: Payer: Managed Care, Other (non HMO) | Admitting: Physical Therapy

## 2016-10-07 DIAGNOSIS — M25612 Stiffness of left shoulder, not elsewhere classified: Secondary | ICD-10-CM

## 2016-10-07 DIAGNOSIS — I890 Lymphedema, not elsewhere classified: Secondary | ICD-10-CM | POA: Diagnosis not present

## 2016-10-07 DIAGNOSIS — M25611 Stiffness of right shoulder, not elsewhere classified: Secondary | ICD-10-CM

## 2016-10-07 NOTE — Therapy (Signed)
[place], [place], [address] Phone: [phone]   Fax:  [phone]  Physical Therapy Treatment  Patient Details  Name: Kimberly Reed MRN: [id number] Date of Birth: 1956-02-12 Referring Provider: Dr. Nicholas Lose  Encounter Date: 10/07/2016      PT End of Session - 10/07/16 1526    Visit Number 6   Number of Visits 14   Date for PT Re-Evaluation 10/27/16   PT Start Time 1435   PT Stop Time 1521   PT Time Calculation (min) 46 min   Activity Tolerance Patient tolerated treatment well   Behavior During Therapy [facility] for tasks assessed/performed      Past Medical History:  Diagnosis Date  . Anemia   . Anxiety   . Atypical chest pain   . Breast cancer ([place]) 05/2014   left  . Cancer ([place])    left breast dx. 6'96- chemo planned to start 07-09-14- Dr. Lindi Adie[facility] follows  . Depression   . Family history of heart disease   . Glaucoma   . Glaucoma   . History of syncope 2008   loss of bladder control eval by Neuro  . Hot flashes   . Hyperlipemia   . Hypothyroid   . Insomnia   . Migraine without aura   . Wears glasses     Past Surgical History:  Procedure Laterality Date  . BREAST LUMPECTOMY WITH NEEDLE LOCALIZATION AND AXILLARY LYMPH NODE DISSECTION Left 01/30/2015   Procedure: BREAST LUMPECTOMY WITH NEEDLE LOCALIZATION LEFT AXILLARY DISSECTION REMOVAL OF PORT;  Surgeon: [name], MD;  Location: [facility];  Service: General;  Laterality: Left;  . CATARACT EXTRACTION Left   . DILATION AND CURETTAGE OF UTERUS    . ESOPHAGOGASTRODUODENOSCOPY  06/01/14  . EXAMINATION UNDER ANESTHESIA  02/24/2013   Procedure: EXAM UNDER ANESTHESIA;  Surgeon: Azalia Bilis, MD;  Location: [facility];  Service: Gynecology;;  with removal of prolapsing cervical mass; pap smear and endometrial biopsy  . GLAUCOMA SURGERY Left    x1   . PORTACATH PLACEMENT Right 07/06/2014   Procedure: PORT  A CATH  PLACEMENT;  Surgeon: [name], MD;  [facility];  Service: General;  Laterality: Right;  . TONSILLECTOMY     child  . uterine polyp removed     per hysteroscopy about 1 1/2 yrs ago.    There were no vitals filed for this visit.      Subjective Assessment - 10/07/16 1437    Subjective Okay today. Nothing new. Hasn't done the manual lymph drainage. Says the machine would do that for her, but is still unsure about the machine.   Currently in Pain? Yes   Pain Location --  my body               LYMPHEDEMA/ONCOLOGY QUESTIONNAIRE - 10/07/16 1511      Left Upper Extremity Lymphedema   Olecranon Process 40.7 cm   15 cm Proximal to Ulnar Styloid Process 39.4 cm   10 cm Proximal to Ulnar Styloid Process 33.6 cm   Just Proximal to Ulnar Styloid Process 23.9 cm   Across Hand at [employer] 19.7 cm   At [place] of 2nd Digit 6.5 cm                  [facility] Adult PT Treatment/Exercise - 10/07/16 0001      Self-Care   Other Self-Care Comments  Some discussion with patient about whether  [place], [place], [address] Phone: [phone]   Fax:  [phone]  Physical Therapy Treatment  Patient Details  Name: Kimberly Reed MRN: [id number] Date of Birth: 1956-02-12 Referring Provider: Dr. Nicholas Lose  Encounter Date: 10/07/2016      PT End of Session - 10/07/16 1526    Visit Number 6   Number of Visits 14   Date for PT Re-Evaluation 10/27/16   PT Start Time 1435   PT Stop Time 1521   PT Time Calculation (min) 46 min   Activity Tolerance Patient tolerated treatment well   Behavior During Therapy [facility] for tasks assessed/performed      Past Medical History:  Diagnosis Date  . Anemia   . Anxiety   . Atypical chest pain   . Breast cancer ([place]) 05/2014   left  . Cancer ([place])    left breast dx. 6'96- chemo planned to start 07-09-14- Dr. Lindi Adie, [facility] follows  . Depression   . Family history of heart disease   . Glaucoma   . Glaucoma   . History of syncope 2008   loss of bladder control eval by Neuro  . Hot flashes   . Hyperlipemia   . Hypothyroid   . Insomnia   . Migraine without aura   . Wears glasses     Past Surgical History:  Procedure Laterality Date  . BREAST LUMPECTOMY WITH NEEDLE LOCALIZATION AND AXILLARY LYMPH NODE DISSECTION Left 01/30/2015   Procedure: BREAST LUMPECTOMY WITH NEEDLE LOCALIZATION LEFT AXILLARY DISSECTION REMOVAL OF PORT;  Surgeon: [name], MD;  Location: [facility];  Service: General;  Laterality: Left;  . CATARACT EXTRACTION Left   . DILATION AND CURETTAGE OF UTERUS    . ESOPHAGOGASTRODUODENOSCOPY  06/01/14  . EXAMINATION UNDER ANESTHESIA  02/24/2013   Procedure: EXAM UNDER ANESTHESIA;  Surgeon: Azalia Bilis, MD;  Location: [facility];  Service: Gynecology;;  with removal of prolapsing cervical mass; pap smear and endometrial biopsy  . GLAUCOMA SURGERY Left    x1   . PORTACATH PLACEMENT Right 07/06/2014   Procedure: PORT  A CATH  PLACEMENT;  Surgeon: [name], MD;  [facility];  Service: General;  Laterality: Right;  . TONSILLECTOMY     child  . uterine polyp removed     per hysteroscopy about 1 1/2 yrs ago.    There were no vitals filed for this visit.      Subjective Assessment - 10/07/16 1437    Subjective Okay today. Nothing new. Hasn't done the manual lymph drainage. Says the machine would do that for her, but is still unsure about the machine.   Currently in Pain? Yes   Pain Location --  my body               LYMPHEDEMA/ONCOLOGY QUESTIONNAIRE - 10/07/16 1511      Left Upper Extremity Lymphedema   Olecranon Process 40.7 cm   15 cm Proximal to Ulnar Styloid Process 39.4 cm   10 cm Proximal to Ulnar Styloid Process 33.6 cm   Just Proximal to Ulnar Styloid Process 23.9 cm   Across Hand at [employer] 19.7 cm   At [place] of 2nd Digit 6.5 cm                  [facility] Adult PT Treatment/Exercise - 10/07/16 0001      Self-Care   Other Self-Care Comments  Some discussion with patient about whether  of left female breast ([place]) 05/28/2014  . Snoring 04/26/2014  . Persistent disorder of initiating or maintaining sleep 04/26/2014  . Atypical chest pain 03/22/2014  . Medication side effect 02/19/2014  . Adjustment disorder with mixed anxiety and depressed mood 02/19/2014  . Pain of left heel 11/14/2012  . Memory difficulties 11/14/2012  . Stress at work 11/14/2012  . Plantar fasciitis of left foot 11/14/2012  . Neoplasm of uncertain behavior of skin 07/10/2011  . Infection, skin 07/10/2011  . Family hx of aortic aneurysm 03/16/2011  . HYPERLIPIDEMIA 07/24/2008  . OBESITY, UNSPECIFIED 07/24/2008  . DYSTHYMIA, CHRONIC 07/24/2008  . PERIMENOPAUSAL SYNDROME 07/24/2008  . FATIGUE 07/24/2008  . HYPOTHYROIDISM 11/29/2007  . GLUCOMA 11/29/2007  . HEADACHE 11/29/2007    Skarlette Lattner 10/07/2016, 3:36 PM  [place], [place], [address] Phone: [phone]   Fax:  [phone]  Name: Rosilyn Coachman MRN: [id number] Date of Birth: 1956/02/17   Serafina Royals, PT 10/07/16 3:36 PM

## 2016-10-13 ENCOUNTER — Ambulatory Visit: Payer: Managed Care, Other (non HMO) | Admitting: Physical Therapy

## 2016-10-13 ENCOUNTER — Encounter: Payer: Self-pay | Admitting: Neurology

## 2016-10-13 ENCOUNTER — Ambulatory Visit (INDEPENDENT_AMBULATORY_CARE_PROVIDER_SITE_OTHER): Payer: Managed Care, Other (non HMO) | Admitting: Neurology

## 2016-10-13 ENCOUNTER — Encounter: Payer: Self-pay | Admitting: Physical Therapy

## 2016-10-13 VITALS — BP 163/92 | HR 77 | Ht 65.0 in | Wt 295.0 lb

## 2016-10-13 DIAGNOSIS — R413 Other amnesia: Secondary | ICD-10-CM

## 2016-10-13 DIAGNOSIS — I890 Lymphedema, not elsewhere classified: Secondary | ICD-10-CM | POA: Diagnosis not present

## 2016-10-13 DIAGNOSIS — G479 Sleep disorder, unspecified: Secondary | ICD-10-CM | POA: Diagnosis not present

## 2016-10-13 DIAGNOSIS — G4489 Other headache syndrome: Secondary | ICD-10-CM | POA: Diagnosis not present

## 2016-10-13 HISTORY — DX: Other headache syndrome: G44.89

## 2016-10-13 MED ORDER — GABAPENTIN 100 MG PO CAPS: 200.0000 mg | ORAL_CAPSULE | Freq: Every day | ORAL | 3 refills | Status: DC

## 2016-10-13 NOTE — Therapy (Signed)
[place], [place], [address] Phone: [phone]   Fax:  [phone]  Physical Therapy Treatment  Patient Details  Name: Kimberly Reed MRN: [id number] Date of Birth: 1955-12-31 Referring Provider: Dr. Nicholas Lose  Encounter Date: 10/13/2016      PT End of Session - 10/13/16 1603    Visit Number 7   Number of Visits 14   Date for PT Re-Evaluation 10/27/16   PT Start Time 1519   PT Stop Time 1602   PT Time Calculation (min) 43 min   Activity Tolerance Patient tolerated treatment well   Behavior During Therapy [facility] for tasks assessed/performed      Past Medical History:  Diagnosis Date  . Anemia   . Anxiety   . Atypical chest pain   . Breast cancer ([place]) 05/2014   left  . Cancer ([place])    left breast dx. 8'85- chemo planned to start 07-09-14- Dr. Lindi Adie, [facility] follows  . Depression   . Family history of heart disease   . Glaucoma   . Headache syndrome 10/13/2016  . History of syncope 2008   loss of bladder control eval by Neuro  . Hot flashes   . Hyperlipemia   . Hypothyroid   . Insomnia   . Lymphedema    L arm  . Migraine without aura   . Wears glasses     Past Surgical History:  Procedure Laterality Date  . BREAST LUMPECTOMY WITH NEEDLE LOCALIZATION AND AXILLARY LYMPH NODE DISSECTION Left 01/30/2015   Procedure: BREAST LUMPECTOMY WITH NEEDLE LOCALIZATION LEFT AXILLARY DISSECTION REMOVAL OF PORT;  Surgeon: [name], MD;  Location: [facility];  Service: General;  Laterality: Left;  . CATARACT EXTRACTION Left   . DILATION AND CURETTAGE OF UTERUS    . ESOPHAGOGASTRODUODENOSCOPY  06/01/14  . EXAMINATION UNDER ANESTHESIA  02/24/2013   Procedure: EXAM UNDER ANESTHESIA;  Surgeon: Azalia Bilis, MD;  Location: [facility];  Service: Gynecology;;  with removal of prolapsing cervical mass; pap smear and endometrial biopsy  . GLAUCOMA SURGERY Left    x1   . PORTACATH  PLACEMENT Right 07/06/2014   Procedure: PORT A CATH  PLACEMENT;  Surgeon: [name], MD;  [facility];  Service: General;  Laterality: Right;  . TONSILLECTOMY     child  . uterine polyp removed     per hysteroscopy about 1 1/2 yrs ago.    There were no vitals filed for this visit.      Subjective Assessment - 10/13/16 1522    Subjective I am not doing good today. I don't feel well. I have a call from [employer]. I have to call them back. I am still thinking about the compression pump.    Pertinent History Breast cancer diagnosed in August 2015.  Had neo-adjuvant chemotherapy and left lumpectomy with 9 lymph nodes removed, all negative; radiation completed summer 2016.  Had tried 2 or 3 anti-estrogen pills but couldn't tolerate them.  Glaucoma; hypothyroid.     Patient Stated Goals reduce the left arm swelling   Currently in Pain? No/denies   Pain Score 0-No pain                         [facility] Adult PT Treatment/Exercise - 10/13/16 0001      Manual Therapy   Manual Therapy Edema management   Manual Lymphatic Drainage (MLD) Manual lymph drainage to left arm in supine: short  neck, superficial and deep abdominals, right axilla and left inguinal region; anterior inter-axillary and left axillo-inguinal pathway, left UE from fingers to shoulder redirecting along pathways; then in right sidelying, posterior interaxillary anastomosis and left axillo-inguinal anastomosis.                        [facility] Goals - 10/07/16 1530      CC Long Term Goal  #1   Title Patient will be knowledgeable about compression garments and other equipment options for self-management of lymphedema.   Status Partially Met     CC Long Term Goal  #2   Title Patient will be knowledgeable about how to perform self-manual lymph drainage.   Baseline 09/22/16- will begin next session; on 10/07/16, patient indicated she didn't think she had the patience to do this and might  prefer the pump   Status On-going     CC Long Term Goal  #3   Title Patient will be knowledgeable about lymphedema risk reduction practices   Status On-going            Plan - 10/13/16 1603    Clinical Impression Statement Re educated patient about the importance of day time and night time garments and their purpose because she voiced not understand. Re educated pt about a compression pump and its benefits. Pt states the compression garment company has called her and she needs to return their phone call. She is still thinking about the compression pump. Performed MLD to LUE today.    Rehab Potential Good   Clinical Impairments Affecting Rehab Potential vague complaints of vertigo or not feeling well; difficulty getting off work to attend therapy sessions   PT Frequency --  1-2x/wk   PT Duration 4 weeks   PT Next Visit Plan Continue manual lymph drainage.  Check again on status of getting garments and Flexit[employer].   Consulted and Agree with Plan of Care Patient      Patient will benefit from skilled therapeutic intervention in order to improve the following deficits and impairments:  Decreased knowledge of precautions, Decreased knowledge of use of DME, Increased edema, Decreased strength, Impaired perceived functional ability  Visit Diagnosis: Lymphedema, not elsewhere classified     Problem List Patient Active Problem List   Diagnosis Date Noted  . Headache syndrome 10/13/2016  . Glaucoma 01/23/2015  . Visit for preventive health examination 01/23/2015  . Chemotherapy induced neutropenia ([place]) 08/02/2014  . Anxiety 07/04/2014  . Anemia due to antineoplastic chemotherapy 06/22/2014  . Depression 06/06/2014  . Breast cancer of upper-outer quadrant of left female breast ([place]) 05/28/2014  . Snoring 04/26/2014  . Persistent disorder of initiating or maintaining sleep 04/26/2014  . Atypical chest pain 03/22/2014  . Medication side effect 02/19/2014  . Adjustment disorder  with mixed anxiety and depressed mood 02/19/2014  . Pain of left heel 11/14/2012  . Memory difficulties 11/14/2012  . Stress at work 11/14/2012  . Plantar fasciitis of left foot 11/14/2012  . Neoplasm of uncertain behavior of skin 07/10/2011  . Infection, skin 07/10/2011  . Family hx of aortic aneurysm 03/16/2011  . HYPERLIPIDEMIA 07/24/2008  . OBESITY, UNSPECIFIED 07/24/2008  . DYSTHYMIA, CHRONIC 07/24/2008  . PERIMENOPAUSAL SYNDROME 07/24/2008  . FATIGUE 07/24/2008  . HYPOTHYROIDISM 11/29/2007  . GLUCOMA 11/29/2007  . HEADACHE 11/29/2007    Alexia Freestone 10/13/2016, 4:05 PM  [place], [place], [address] Phone: [phone]  Fax:  [phone]  Name: Marleena Shubert MRN: [id number] Date of Birth: [birth year]  Allyson Sabal, PT 10/13/16 4:05 PM

## 2016-10-13 NOTE — Progress Notes (Signed)
Reason for visit: Headache  Referring physician: Dr. Shaune Pascal is a 60 y.o. female  History of present illness:  Ms. 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 The patient has undergone radiation therapy and chemotherapy that ended in January 2016. The patient claims that in January 2017 she began having some issues with a dizzy sensation also associated with a low-grade headache. The patient has a history of migraine headaches, but this type of headache is different from her usual headache. The patient describes a low-grade discomfort in the head, with some discomfort into the neck area as well. She may have a lightheaded floaty feeling, not true vertigo. The patient has a sensation as if there is a balloon inside of her head or something squeezing the head such as a vice. The headaches are present virtually daily in nature as is the dizziness. The patient denies any nausea or vomiting. At nighttime, she may have some difficulty getting to sleep, she may wake up in the middle the night with very dry mouth, she is concerned that she may be snoring. The patient has some fatigue during the day and she feels weak all over. She has a history of glaucoma as well. She denies any numbness of extremities or difficulty controlling the bowels or the bladder. The patient denies any severe changes in balance. She had MRI of the brain done in 2015 that was normal with and without gadolinium enhancement. She is sent to this office for further evaluation. The patient claims that she has gained a lot of weight over the last year. The patient denies any true memory problems, she is actually functioning very well at work.  Past Medical History:  Diagnosis Date  . Anemia   . Anxiety   . Atypical chest pain   . Breast cancer ([place]) 05/2014   left  . Cancer ([place])    left breast dx. [id number] chemo planned to start 07-09-14-  Dr. Lindi Adie, [facility] Center follows  . Depression   . Family history of heart disease   . Glaucoma   . Headache syndrome 10/13/2016  . History of syncope 2008   loss of bladder control eval by Neuro  . Hot flashes   . Hyperlipemia   . Hypothyroid   . Insomnia   . Lymphedema    L arm  . Migraine without aura   . Wears glasses     Past Surgical History:  Procedure Laterality Date  . BREAST LUMPECTOMY WITH NEEDLE LOCALIZATION AND AXILLARY LYMPH NODE DISSECTION Left 01/30/2015   Procedure: BREAST LUMPECTOMY WITH NEEDLE LOCALIZATION LEFT AXILLARY DISSECTION REMOVAL OF PORT;  Surgeon: [name], MD;  Location: [facility];  Service: General;  Laterality: Left;  . CATARACT EXTRACTION Left   . DILATION AND CURETTAGE OF UTERUS    . ESOPHAGOGASTRODUODENOSCOPY  06/01/14  . EXAMINATION UNDER ANESTHESIA  02/24/2013   Procedure: EXAM UNDER ANESTHESIA;  Surgeon: Azalia Bilis, MD;  Location: [facility];  Service: Gynecology;;  with removal of prolapsing cervical mass; pap smear and endometrial biopsy  . GLAUCOMA SURGERY Left    x1   . PORTACATH PLACEMENT Right 07/06/2014   Procedure: PORT A CATH  PLACEMENT;  Surgeon: [name], MD;  [facility];  Service: General;  Laterality: Right;  . TONSILLECTOMY     child  . uterine polyp removed     per hysteroscopy about 1  1/2 yrs ago.    Family History  Problem Relation Age of Onset  . Aneurysm Mother   . Kidney disease Mother   . Hypertension Father   . Dementia Father   . Kidney disease Brother   . Heart disease      "all of moms side"  . Depression    . Heart failure Cousin     Social history:  reports that she has never smoked. She has never used smokeless tobacco. She reports that she does not drink alcohol or use drugs.  Medications:  Prior to Admission medications   Medication Sig Start Date End Date Taking? Authorizing Provider  brimonidine-timolol (COMBIGAN) 0.2-0.5 % ophthalmic solution Place 1 drop  into the right eye every 12 (twelve) hours.    Yes Historical Provider, MD  dorzolamide (TRUSOPT) 2 % ophthalmic solution Place 1 drop into the right eye 2 (two) times daily.    Yes Historical Provider, MD  ibuprofen (ADVIL,MOTRIN) 200 MG tablet Take 200 mg by mouth every 6 (six) hours as needed.   Yes Historical Provider, MD  levothyroxine (SYNTHROID, LEVOTHROID) 112 MCG tablet  09/25/16  Yes Historical Provider, MD  non-metallic deodorant Jethro Poling) MISC Apply 1 application topically daily as needed.   Yes Historical Provider, MD  OVER THE COUNTER MEDICATION Take 1 capsule by mouth daily.   Yes Historical Provider, MD  Travoprost, BAK Free, (TRAVATAN) 0.004 % SOLN ophthalmic solution Place 1 drop into the right eye at bedtime.    Yes Historical Provider, MD      Allergies  Allergen Reactions  . Other Nausea Only and Other (See Comments)    Antibiotics - Pt cannot identify which antibiotics she reacts to.  Make her nausous and gives her headache.  . Effexor [Venlafaxine] Nausea And Vomiting    ROS:  Out of a complete 14 system review of symptoms, the patient complains only of the following symptoms, and all other reviewed systems are negative.  Weight gain, fatigue Swelling in the legs, lymphedema left arm Spinning sensations Itching Blurred vision Shortness of breath, snoring Diarrhea Urination problems Swollen lymph nodes Feeling hot, flushing Joint pain, joint swelling, aching muscles Memory loss, confusion, headache, weakness, dizziness Depression, anxiety, too much sleep, not enough sleep, decreased energy, change in appetite, disinterest in activities Insomnia, sleepiness, snoring  Blood pressure (!) 163/92, pulse 77, height 5\' 5"  (1.651 m), weight 295 lb (133.8 kg), last menstrual period 02/15/2013.  Physical Exam  General: The patient is alert and cooperative at the time of the examination. The patient is morbidly obese.  Eyes: Pupils are equal, round, and reactive to  light. Discs are flat bilaterally.  Neck: The neck is supple, no carotid bruits are noted.  Respiratory: The respiratory examination is clear.  Cardiovascular: The cardiovascular examination reveals a regular rate and rhythm, no obvious murmurs or rubs are noted.  Skin: Extremities are with 2+ edema of the left arm, 1+ edema of the ankles bilaterally.  Neurologic Exam  Mental status: The patient is alert and oriented x 3 at the time of the examination. The patient has apparent normal recent and remote memory, with an apparently normal attention span and concentration ability. Mini-Mental Status Examination done today shows a total score of 30/30.  Cranial nerves: Facial symmetry is present. There is good sensation of the face to pinprick and soft touch bilaterally. The strength of the facial muscles and the muscles to head turning and shoulder shrug are normal bilaterally. Speech is well enunciated, no aphasia  or dysarthria is noted. Extraocular movements are full. Visual fields are full. The tongue is midline, and the patient has symmetric elevation of the soft palate. No obvious hearing deficits are noted.  Motor: The motor testing reveals 5 over 5 strength of all 4 extremities. Good symmetric motor tone is noted throughout.  Sensory: Sensory testing is intact to pinprick, soft touch, vibration sensation, and position sense on all 4 extremities. No evidence of extinction is noted.  Coordination: Cerebellar testing reveals good finger-nose-finger and heel-to-shin bilaterally.  Gait and station: Gait is normal. Tandem gait is slightly unsteady. Romberg is negative. No drift is seen.  Reflexes: Deep tendon reflexes are symmetric, but are depressed bilaterally. Toes are downgoing bilaterally.   Assessment/Plan:  1. Probable muscle tension headache  2. Reports of dizziness  3. History breast cancer  4. Fatigue, recent weight gain, possible sleep apnea  5. Morbid obesity  The  patient likely is having muscle tension headaches that may be associated with the dizziness that is a daily occurrence at this point. The patient will be placed on low-dose gabapentin. The patient will be set up for MRI evaluation of the brain. The patient will be referred for sleep evaluation, she is at risk for sleep apnea given her recent severe weight gain, daytime fatigue, and history of waking up with dry mouth. She will follow-up in about 3 months.  Jill Alexanders MD 10/13/2016 2:36 PM  [facility] [address] [place], [place] [address]  Phone [phone] Fax [phone]

## 2016-10-13 NOTE — Patient Instructions (Signed)
   With the gabapentin 100 mg take one at night for one week, then take 2 at night.  Neurontin (gabapentin) may result in drowsiness, ankle swelling, gait instability, or possibly dizziness. Please contact our office if significant side effects occur with this medication.

## 2016-10-15 ENCOUNTER — Encounter: Payer: Self-pay | Admitting: Physical Therapy

## 2016-10-15 ENCOUNTER — Ambulatory Visit: Payer: Managed Care, Other (non HMO) | Admitting: Physical Therapy

## 2016-10-15 DIAGNOSIS — I890 Lymphedema, not elsewhere classified: Secondary | ICD-10-CM

## 2016-10-15 NOTE — Therapy (Signed)
[place], [place], [address] Phone: [phone]   Fax:  [phone]  Physical Therapy Treatment  Patient Details  Name: Kimberly Reed MRN: [id number] Date of Birth: 1956/07/10 Referring Provider: Dr. Nicholas Lose  Encounter Date: 10/15/2016      PT End of Session - 10/15/16 1358    Visit Number 8   Number of Visits 14   Date for PT Re-Evaluation 10/27/16   PT Start Time 5974   PT Stop Time 1346   PT Time Calculation (min) 43 min   Activity Tolerance Patient tolerated treatment well   Behavior During Therapy [facility] for tasks assessed/performed      Past Medical History:  Diagnosis Date  . Anemia   . Anxiety   . Atypical chest pain   . Breast cancer ([place]) 05/2014   left  . Cancer ([place])    left breast dx. 1'63- chemo planned to start 07-09-14- Dr. Lindi Adie, [facility] follows  . Depression   . Family history of heart disease   . Glaucoma   . Headache syndrome 10/13/2016  . History of syncope 2008   loss of bladder control eval by Neuro  . Hot flashes   . Hyperlipemia   . Hypothyroid   . Insomnia   . Lymphedema    L arm  . Migraine without aura   . Wears glasses     Past Surgical History:  Procedure Laterality Date  . BREAST LUMPECTOMY WITH NEEDLE LOCALIZATION AND AXILLARY LYMPH NODE DISSECTION Left 01/30/2015   Procedure: BREAST LUMPECTOMY WITH NEEDLE LOCALIZATION LEFT AXILLARY DISSECTION REMOVAL OF PORT;  Surgeon: [name], MD;  Location: [facility];  Service: General;  Laterality: Left;  . CATARACT EXTRACTION Left   . DILATION AND CURETTAGE OF UTERUS    . ESOPHAGOGASTRODUODENOSCOPY  06/01/14  . EXAMINATION UNDER ANESTHESIA  02/24/2013   Procedure: EXAM UNDER ANESTHESIA;  Surgeon: Azalia Bilis, MD;  Location: [facility];  Service: Gynecology;;  with removal of prolapsing cervical mass; pap smear and endometrial biopsy  . GLAUCOMA SURGERY Left    x1   . PORTACATH  PLACEMENT Right 07/06/2014   Procedure: PORT A CATH  PLACEMENT;  Surgeon: [name], MD;  Location: [facility];  Service: General;  Laterality: Right;  . TONSILLECTOMY     child  . uterine polyp removed     per hysteroscopy about 1 1/2 yrs ago.    There were no vitals filed for this visit.      Subjective Assessment - 10/15/16 1309    Subjective I think I might wait until next year to get the garments because I am switching insurances. I have to call Mortimer Fries again with [employer] about the pump and my insurance.    Pertinent History Breast cancer diagnosed in August 2015.  Had neo-adjuvant chemotherapy and left lumpectomy with 9 lymph nodes removed, all negative; radiation completed summer 2016.  Had tried 2 or 3 anti-estrogen pills but couldn't tolerate them.  Glaucoma; hypothyroid.     Patient Stated Goals reduce the left arm swelling   Currently in Pain? No/denies   Pain Score 0-No pain                         [facility] Adult PT Treatment/Exercise - 10/15/16 0001      Manual Therapy   Manual Therapy Edema management   Manual Lymphatic Drainage (MLD) Manual lymph drainage to left arm in supine:  short neck, superficial and deep abdominals, right axilla and left inguinal region; anterior inter-axillary and left axillo-inguinal pathway, left UE from fingers to shoulder redirecting along pathways; then in right sidelying, posterior interaxillary anastomosis and left axillo-inguinal anastomosis.                PT Education - 10/15/16 1406    Education provided Yes   Education Details importance of following up with compression pump and garment companies for long term management of edema   Person(s) Educated Patient   Methods Explanation   Comprehension Verbalized understanding                [facility] Goals - 10/15/16 1308      CC Long Term Goal  #1   Title Patient will be knowledgeable about compression garments and other equipment options  for self-management of lymphedema.   Baseline 09/22/16- ongoing education regarding this, 10/15/16- pt in process of ordering garments and is decinding whether to do it this year or wait until next year on her new insurance   Time 4   Period Weeks   Status Partially Met     CC Long Term Goal  #2   Title Patient will be knowledgeable about how to perform self-manual lymph drainage.   Baseline 09/22/16- will begin next session; on 10/07/16, patient indicated she didn't think she had the patience to do this and might prefer the pump, 10/15/16- pt still states she is going to follow up on obtaining the pump for drainage because she doesn't have patience to do the manual drainage   Time 4   Period Weeks   Status On-going     CC Long Term Goal  #3   Title Patient will be knowledgeable about lymphedema risk reduction practices   Baseline 10/15/16- did not assess this session   Time 4   Period Weeks   Status On-going            Plan - 10/15/16 1400    Clinical Impression Statement Patient contacted [employer] and she states they educated her to wait and order in new year. Therapist encouraged pt to follow up with this and see if she can proceed with them this year since she needs garments. She was also educated about importance with following up with compression pump company but she is unsure if she should proceed with it this year or wait until next year when she has different insurance. Performed MLD to LUE today. Pt was going to follow up with compression garment and compression pump companies and see if she can get those this year.    Rehab Potential Good   Clinical Impairments Affecting Rehab Potential vague complaints of vertigo or not feeling well; difficulty getting off work to attend therapy sessions   PT Frequency --  1-2x/wk   PT Duration 4 weeks   PT Treatment/Interventions ADLs/Self Care Home Management;DME Instruction;Patient/family education;Manual techniques;Manual lymph  drainage   PT Next Visit Plan Continue manual lymph drainage.  Check again on status of getting garments and Flexitouch.   Consulted and Agree with Plan of Care Patient      Patient will benefit from skilled therapeutic intervention in order to improve the following deficits and impairments:  Decreased knowledge of precautions, Decreased knowledge of use of DME, Increased edema, Decreased strength, Impaired perceived functional ability  Visit Diagnosis: Lymphedema, not elsewhere classified     Problem List Patient Active Problem List   Diagnosis Date Noted  .  Headache syndrome 10/13/2016  . Glaucoma 01/23/2015  . Visit for preventive health examination 01/23/2015  . Chemotherapy induced neutropenia ([place]) 08/02/2014  . Anxiety 07/04/2014  . Anemia due to antineoplastic chemotherapy 06/22/2014  . Depression 06/06/2014  . Breast cancer of upper-outer quadrant of left female breast ([place]) 05/28/2014  . Snoring 04/26/2014  . Persistent disorder of initiating or maintaining sleep 04/26/2014  . Atypical chest pain 03/22/2014  . Medication side effect 02/19/2014  . Adjustment disorder with mixed anxiety and depressed mood 02/19/2014  . Pain of left heel 11/14/2012  . Memory difficulties 11/14/2012  . Stress at work 11/14/2012  . Plantar fasciitis of left foot 11/14/2012  . Neoplasm of uncertain behavior of skin 07/10/2011  . Infection, skin 07/10/2011  . Family hx of aortic aneurysm 03/16/2011  . HYPERLIPIDEMIA 07/24/2008  . Morbid obesity ([place]) 07/24/2008  . DYSTHYMIA, CHRONIC 07/24/2008  . PERIMENOPAUSAL SYNDROME 07/24/2008  . FATIGUE 07/24/2008  . HYPOTHYROIDISM 11/29/2007  . GLUCOMA 11/29/2007  . HEADACHE 11/29/2007    Alexia Freestone 10/15/2016, 2:07 PM  [place], [place], [address] Phone: [phone]   Fax:  [phone]  Name: Kimberly Reed MRN: [id number] Date of Birth:  11-Jan-1956  Manus Gunning, PT 10/15/16 2:08 PM

## 2016-10-21 ENCOUNTER — Ambulatory Visit: Payer: Managed Care, Other (non HMO) | Admitting: Physical Therapy

## 2016-10-21 ENCOUNTER — Other Ambulatory Visit: Payer: Self-pay | Admitting: Nurse Practitioner

## 2016-10-21 DIAGNOSIS — I890 Lymphedema, not elsewhere classified: Secondary | ICD-10-CM | POA: Diagnosis not present

## 2016-10-21 NOTE — Therapy (Signed)
[place], [place], [address] Phone: [phone]   Fax:  [phone]  Physical Therapy Treatment  Patient Details  Name: Kimberly Reed MRN: [id number] Date of Birth: 1956-06-02 Referring Provider: Dr. Nicholas Lose  Encounter Date: 10/21/2016      PT End of Session - 10/21/16 1602    Visit Number 9   Number of Visits 14   Date for PT Re-Evaluation 10/27/16   PT Start Time 3220   PT Stop Time 1559   PT Time Calculation (min) 44 min   Activity Tolerance Patient tolerated treatment well   Behavior During Therapy [facility] for tasks assessed/performed      Past Medical History:  Diagnosis Date  . Anemia   . Anxiety   . Atypical chest pain   . Breast cancer ([place]) 05/2014   left  . Cancer ([place])    left breast dx. 2'54- chemo planned to start 07-09-14- Dr. Lindi Adie, [facility] follows  . Depression   . Family history of heart disease   . Glaucoma   . Headache syndrome 10/13/2016  . History of syncope 2008   loss of bladder control eval by Neuro  . Hot flashes   . Hyperlipemia   . Hypothyroid   . Insomnia   . Lymphedema    L arm  . Migraine without aura   . Wears glasses     Past Surgical History:  Procedure Laterality Date  . BREAST LUMPECTOMY WITH NEEDLE LOCALIZATION AND AXILLARY LYMPH NODE DISSECTION Left 01/30/2015   Procedure: BREAST LUMPECTOMY WITH NEEDLE LOCALIZATION LEFT AXILLARY DISSECTION REMOVAL OF PORT;  Surgeon: [name], MD;  Location: [facility];  Service: General;  Laterality: Left;  . CATARACT EXTRACTION Left   . DILATION AND CURETTAGE OF UTERUS    . ESOPHAGOGASTRODUODENOSCOPY  06/01/14  . EXAMINATION UNDER ANESTHESIA  02/24/2013   Procedure: EXAM UNDER ANESTHESIA;  Surgeon: Azalia Bilis, MD;  Location: [facility];  Service: Gynecology;;  with removal of prolapsing cervical mass; pap smear and endometrial biopsy  . GLAUCOMA SURGERY Left    x1   . PORTACATH  PLACEMENT Right 07/06/2014   Procedure: PORT A CATH  PLACEMENT;  Surgeon: [name], MD;  Location: [facility];  Service: General;  Laterality: Right;  . TONSILLECTOMY     child  . uterine polyp removed     per hysteroscopy about 1 1/2 yrs ago.    There were no vitals filed for this visit.      Subjective Assessment - 10/21/16 1516    Subjective Pt. spoke to Ortencia Kick at [employer] to see if garments can be ordered in this calendar year--she spoke to him last Thursday and again today.  Pt. is not happy about how this is being handled by them.  Saw a neurologist and will have an MRI on Sunday. He also wants to do a sleep study.  She feels she doesn't sleep well--it's intermittent.   Currently in Pain? No/denies                         [facility] Adult PT Treatment/Exercise - 10/21/16 0001      Manual Therapy   Manual Lymphatic Drainage (MLD) Manual lymph drainage to left arm in supine: short neck, superficial and deep abdominals, right axilla and left inguinal region; anterior inter-axillary and left axillo-inguinal pathway, left UE from fingers to shoulder redirecting along pathways; then in right sidelying, posterior interaxillary anastomosis  and left axillo-inguinal anastomosis.                        [facility] Goals - 10/15/16 1308      CC Long Term Goal  #1   Title Patient will be knowledgeable about compression garments and other equipment options for self-management of lymphedema.   Baseline 09/22/16- ongoing education regarding this, 10/15/16- pt in process of ordering garments and is decinding whether to do it this year or wait until next year on her new insurance   Time 4   Period Weeks   Status Partially Met     CC Long Term Goal  #2   Title Patient will be knowledgeable about how to perform self-manual lymph drainage.   Baseline 09/22/16- will begin next session; on 10/07/16, patient indicated she didn't think she had the patience to do this  and might prefer the pump, 10/15/16- pt still states she is going to follow up on obtaining the pump for drainage because she doesn't have patience to do the manual drainage   Time 4   Period Weeks   Status On-going     CC Long Term Goal  #3   Title Patient will be knowledgeable about lymphedema risk reduction practices   Baseline 10/15/16- did not assess this session   Time 4   Period Weeks   Status On-going            Plan - 10/21/16 1602    Clinical Impression Statement Pt. clarifies that she wants to get garments in this calendar year if possible, but had wanted to wait before pursuing the pump.  She has called the garment company in an effort to move this forward before year's end.  She appears more alert and interactive today than in previous sessions with this therapist.   Rehab Potential Good   Clinical Impairments Affecting Rehab Potential vague complaints of vertigo or not feeling well; difficulty getting off work to attend therapy sessions   PT Frequency --  1-2x/week   PT Duration 4 weeks   PT Treatment/Interventions ADLs/Self Care Home Management;DME Instruction;Patient/family education;Manual techniques;Manual lymph drainage   PT Next Visit Plan Remeasure arm. Continue manual lymph drainage.  Check again on status of getting garments and Flexitouch.   Consulted and Agree with Plan of Care Patient      Patient will benefit from skilled therapeutic intervention in order to improve the following deficits and impairments:  Decreased knowledge of precautions, Decreased knowledge of use of DME, Increased edema, Decreased strength, Impaired perceived functional ability  Visit Diagnosis: Lymphedema, not elsewhere classified     Problem List Patient Active Problem List   Diagnosis Date Noted  . Headache syndrome 10/13/2016  . Glaucoma 01/23/2015  . Visit for preventive health examination 01/23/2015  . Chemotherapy induced neutropenia ([place]) 08/02/2014  . Anxiety  07/04/2014  . Anemia due to antineoplastic chemotherapy 06/22/2014  . Depression 06/06/2014  . Breast cancer of upper-outer quadrant of left female breast ([place]) 05/28/2014  . Snoring 04/26/2014  . Persistent disorder of initiating or maintaining sleep 04/26/2014  . Atypical chest pain 03/22/2014  . Medication side effect 02/19/2014  . Adjustment disorder with mixed anxiety and depressed mood 02/19/2014  . Pain of left heel 11/14/2012  . Memory difficulties 11/14/2012  . Stress at work 11/14/2012  . Plantar fasciitis of left foot 11/14/2012  . Neoplasm of uncertain behavior of skin 07/10/2011  . Infection, skin 07/10/2011  . Family  hx of aortic aneurysm 03/16/2011  . HYPERLIPIDEMIA 07/24/2008  . Morbid obesity ([place]) 07/24/2008  . DYSTHYMIA, CHRONIC 07/24/2008  . PERIMENOPAUSAL SYNDROME 07/24/2008  . FATIGUE 07/24/2008  . HYPOTHYROIDISM 11/29/2007  . GLUCOMA 11/29/2007  . HEADACHE 11/29/2007    SALISBURY,DONNA 10/21/2016, 4:05 PM  [place], [place], [address] Phone: [phone]   Fax:  [phone]  Name: Kimberly Reed MRN: [id number] Date of Birth: [birth year]  Serafina Royals, PT 10/21/16 4:06 PM

## 2016-10-25 ENCOUNTER — Other Ambulatory Visit: Payer: Managed Care, Other (non HMO)

## 2016-10-26 DIAGNOSIS — B029 Zoster without complications: Secondary | ICD-10-CM

## 2016-10-26 HISTORY — DX: Zoster without complications: B02.9

## 2016-10-27 ENCOUNTER — Ambulatory Visit: Payer: Managed Care, Other (non HMO) | Admitting: Physical Therapy

## 2016-11-03 ENCOUNTER — Ambulatory Visit: Payer: Managed Care, Other (non HMO) | Admitting: Physical Therapy

## 2016-11-10 ENCOUNTER — Ambulatory Visit: Payer: Managed Care, Other (non HMO) | Admitting: Physical Therapy

## 2016-11-17 ENCOUNTER — Ambulatory Visit: Payer: Managed Care, Other (non HMO) | Admitting: Physical Therapy

## 2016-11-17 ENCOUNTER — Encounter: Payer: Self-pay | Admitting: Internal Medicine

## 2016-11-17 NOTE — Telephone Encounter (Signed)
This encounter was created in error - please disregard.

## 2017-01-18 ENCOUNTER — Other Ambulatory Visit: Payer: Self-pay | Admitting: Hematology and Oncology

## 2017-01-18 DIAGNOSIS — N644 Mastodynia: Secondary | ICD-10-CM

## 2017-01-25 ENCOUNTER — Other Ambulatory Visit: Payer: Managed Care, Other (non HMO)

## 2017-01-28 ENCOUNTER — Other Ambulatory Visit: Payer: Managed Care, Other (non HMO)

## 2017-01-28 ENCOUNTER — Ambulatory Visit
Admission: RE | Admit: 2017-01-28 | Discharge: 2017-01-28 | Disposition: A | Discharge status: Home / Self Care | Payer: BLUE CROSS/BLUE SHIELD | Source: Ambulatory Visit | Attending: Hematology and Oncology | Admitting: Hematology and Oncology

## 2017-01-28 DIAGNOSIS — N644 Mastodynia: Secondary | ICD-10-CM

## 2017-02-03 ENCOUNTER — Other Ambulatory Visit: Payer: Self-pay | Admitting: Hematology and Oncology

## 2017-02-03 DIAGNOSIS — Z9889 Other specified postprocedural states: Secondary | ICD-10-CM

## 2017-02-03 DIAGNOSIS — Z1231 Encounter for screening mammogram for malignant neoplasm of breast: Secondary | ICD-10-CM

## 2017-02-04 ENCOUNTER — Other Ambulatory Visit: Payer: Self-pay | Admitting: *Deleted

## 2017-02-04 ENCOUNTER — Other Ambulatory Visit: Payer: Managed Care, Other (non HMO)

## 2017-02-04 DIAGNOSIS — I890 Lymphedema, not elsewhere classified: Secondary | ICD-10-CM

## 2017-02-11 ENCOUNTER — Ambulatory Visit
Admission: RE | Admit: 2017-02-11 | Discharge: 2017-02-11 | Disposition: A | Discharge status: Home / Self Care | Payer: BLUE CROSS/BLUE SHIELD | Source: Ambulatory Visit | Attending: Hematology and Oncology | Admitting: Hematology and Oncology

## 2017-02-11 DIAGNOSIS — Z9889 Other specified postprocedural states: Secondary | ICD-10-CM

## 2017-02-11 HISTORY — DX: Personal history of antineoplastic chemotherapy: Z92.21

## 2017-02-11 HISTORY — DX: Personal history of irradiation: Z92.3

## 2017-02-15 ENCOUNTER — Ambulatory Visit: Payer: BLUE CROSS/BLUE SHIELD | Attending: Hematology and Oncology | Admitting: Physical Therapy

## 2017-02-15 DIAGNOSIS — I890 Lymphedema, not elsewhere classified: Secondary | ICD-10-CM | POA: Diagnosis present

## 2017-02-15 NOTE — Therapy (Signed)
[facility] [address] [place], [place], [address] Phone: [phone]   Fax:  [phone]  Physical Therapy Evaluation  Patient Details  Name: Kimberly Reed MRN: [id number] Date of Birth: 28-Jul-1956 Referring Provider: Dr. Serena Croissant  Encounter Date: 02/15/2017      PT End of Session - 02/15/17 1247    Visit Number 10   Number of Visits 10   Date for PT Re-Evaluation --  none   PT Start Time 1104   PT Stop Time 1148   PT Time Calculation (min) 44 min   Activity Tolerance Patient tolerated treatment well   Behavior During Therapy [facility] for tasks assessed/performed      Past Medical History:  Diagnosis Date  . Anemia   . Anxiety   . Atypical chest pain   . Breast cancer ([facility]) 05/2014   left  . Cancer ([facility])    left breast dx. 6'96- chemo planned to start 07-09-14- Dr. Pamelia Hoit, [facility] follows  . Depression   . Family history of heart disease   . Glaucoma   . Headache syndrome 10/13/2016  . History of syncope 2008   loss of bladder control eval by Neuro  . Hot flashes   . Hyperlipemia   . Hypothyroid   . Insomnia   . Lymphedema    L arm  . Migraine without aura   . Personal history of chemotherapy   . Personal history of radiation therapy   . Wears glasses     Past Surgical History:  Procedure Laterality Date  . BREAST BIOPSY Left 06/07/2014   malignant  . BREAST BIOPSY Left 05/24/2014   malignant  . BREAST LUMPECTOMY Left 01/30/2015   malignant  . BREAST LUMPECTOMY WITH NEEDLE LOCALIZATION AND AXILLARY LYMPH NODE DISSECTION Left 01/30/2015   Procedure: BREAST LUMPECTOMY WITH NEEDLE LOCALIZATION LEFT AXILLARY DISSECTION REMOVAL OF PORT;  Surgeon: Glenna Fellows, MD;  Location: [facility];  Service: General;  Laterality: Left;  . CATARACT EXTRACTION Left   . DILATION AND CURETTAGE OF UTERUS    . ESOPHAGOGASTRODUODENOSCOPY  06/01/14  . EXAMINATION UNDER ANESTHESIA  02/24/2013    Procedure: EXAM UNDER ANESTHESIA;  Surgeon: Bennye Alm, MD;  Location: [facility];  Service: Gynecology;;  with removal of prolapsing cervical mass; pap smear and endometrial biopsy  . GLAUCOMA SURGERY Left    x1   . PORTACATH PLACEMENT Right 07/06/2014   Procedure: PORT A CATH  PLACEMENT;  Surgeon: Glenna Fellows, MD;  Location: [facility];  Service: General;  Laterality: Right;  . TONSILLECTOMY     child  . uterine polyp removed     per hysteroscopy about 1 1/2 yrs ago.    There were no vitals filed for this visit.       Subjective Assessment - 02/15/17 1107    Subjective "I guess I kind of messed up with what I should have been doing.  I've been using the Flexitouch for going on two months now.  there was a timing issue with ordering the nighttime sleeve; I ordered the daytime sleeve but issues kept coming up and they took it back."  Has had some pains in the arm.  The machine is not brining it down, even though she does it every night. "I have to get remeasured for the garments." Emil suggested making some appointments to bring the swelling down.   Pertinent History Breast cancer diagnosed in August 2015.  Had neo-adjuvant chemotherapy and left lumpectomy with 9  lymph nodes removed, all negative; radiation completed summer 2016.  Had tried 2 or 3 anti-estrogen pills but couldn't tolerate them.  Glaucoma; hypothyroid.  Still dealing with vertigo and not feeling well, including today.   Patient Stated Goals reduce the left arm swelling   Currently in Pain? No/denies  gets left hand pain with lots of typing during the day, and it gets uncomfortable late in the day               LYMPHEDEMA/ONCOLOGY QUESTIONNAIRE - 02/15/17 1114      Right Upper Extremity Lymphedema   15 cm Proximal to Olecranon Process 42.7 cm   10 cm Proximal to Olecranon Process 41.4 cm   Olecranon Process 31.4 cm   15 cm Proximal to Ulnar Styloid Process 31.2 cm   10 cm Proximal to Ulnar Styloid Process 26.8  cm   Just Proximal to Ulnar Styloid Process 19.6 cm   Across Hand at [facility] 19.3 cm   At [place] of 2nd Digit 7 cm     Left Upper Extremity Lymphedema   15 cm Proximal to Olecranon Process 50.3 cm   10 cm Proximal to Olecranon Process 51.5 cm   Olecranon Process 42.1 cm   15 cm Proximal to Ulnar Styloid Process 40.5 cm   10 cm Proximal to Ulnar Styloid Process 34.2 cm   Just Proximal to Ulnar Styloid Process 23.3 cm   Across Hand at [facility] 20.1 cm   At [place] of 2nd Digit 6.6 cm                [facility] Adult PT Treatment/Exercise - 02/15/17 0001      Self-Care   Other Self-Care Comments  Discussed treatment options.  See impression and plan below.     Manual Therapy   Manual Lymphatic Drainage (MLD) Manual lymph drainage to left arm in supine: short neck, superficial and deep abdominals, right axilla and left inguinal region; anterior inter-axillary and left axillo-inguinal pathway, left UE from fingers to shoulder redirecting along pathways; then in right sidelying, posterior interaxillary anastomosis and left axillo-inguinal anastomosis.                        [facility] Goals - 02/15/17 1253      CC Long Term Goal  #1   Title Patient will be knowledgeable about compression garments and other equipment options for self-management of lymphedema.   Status Achieved     CC Long Term Goal  #2   Status Deferred     CC Long Term Goal  #3   Title Patient will be knowledgeable about lymphedema risk reduction practices   Status Partially Met            Plan - 02/15/17 1247    Clinical Impression Statement This is a patient known to this clinic who comes in for re-evaluation today.  She reports that she has made mistakes and has not managed her lymphedema well in that she did not get compression garments.  She feels this was due to miscommunication with the vendor.  She has been using a Flexitouch pump, but expresses uncertainty about  whether she has it set up correctly, and also says that it causes her discomfort, which it should not (and I told her that).  Her arm circumference measurements have increased, on the left (affected side) more than the right (and she has gained weight).  The compression garment vendor had  suggested she do a course of therapy before being measured for garments.  However, pt. is unable to come in more than once a week.  Manual lymph drainage once a week will not have a big impact on her swelling.  I discussed this with the patient and it was decided that she would get measured and obtain both day- and nighttime compression garments rather than come to therapy; she was also encouraged to call the pump representative to get her questions about that answered and have her set-up checkeed.    Rehab Potential Good   Clinical Impairments Affecting Rehab Potential vague complaints of vertigo or not feeling well; difficulty getting off work to attend therapy sessions   PT Frequency One time visit   PT Treatment/Interventions ADLs/Self Care Home Management;DME Instruction;Patient/family education;Manual techniques;Manual lymph drainage   PT Next Visit Plan None planned.  Instead, patient plans to obtain day- and nighttime compression garments and continue use of Flexitouch.   Consulted and Agree with Plan of Care Patient      Patient will benefit from skilled therapeutic intervention in order to improve the following deficits and impairments:  Decreased knowledge of precautions, Decreased knowledge of use of DME, Increased edema  Visit Diagnosis: Lymphedema, not elsewhere classified - Plan: PT plan of care cert/re-cert     Problem List Patient Active Problem List   Diagnosis Date Noted  . Headache syndrome 10/13/2016  . Glaucoma 01/23/2015  . Visit for preventive health examination 01/23/2015  . Chemotherapy induced neutropenia ([facility]) 08/02/2014  . Anxiety 07/04/2014  . Anemia due to antineoplastic  chemotherapy 06/22/2014  . Depression 06/06/2014  . Breast cancer of upper-outer quadrant of left female breast ([facility]) 05/28/2014  . Snoring 04/26/2014  . Persistent disorder of initiating or maintaining sleep 04/26/2014  . Atypical chest pain 03/22/2014  . Medication side effect 02/19/2014  . Adjustment disorder with mixed anxiety and depressed mood 02/19/2014  . Pain of left heel 11/14/2012  . Memory difficulties 11/14/2012  . Stress at work 11/14/2012  . Plantar fasciitis of left foot 11/14/2012  . Neoplasm of uncertain behavior of skin 07/10/2011  . Infection, skin 07/10/2011  . Family hx of aortic aneurysm 03/16/2011  . HYPERLIPIDEMIA 07/24/2008  . Morbid obesity ([facility]) 07/24/2008  . DYSTHYMIA, CHRONIC 07/24/2008  . PERIMENOPAUSAL SYNDROME 07/24/2008  . FATIGUE 07/24/2008  . HYPOTHYROIDISM 11/29/2007  . GLUCOMA 11/29/2007  . HEADACHE 11/29/2007    Kimberly Reed 02/15/2017, 12:55 PM  [facility] [address] [place], [place], [address] Phone: [phone]   Fax:  [phone]  Name: Kimberly Reed MRN: [id number] Date of Birth: 08/25/1956  PHYSICAL THERAPY DISCHARGE SUMMARY  Visits from Start of Care: 10 total, 1 today for re-eval  Current functional level related to goals / functional outcomes: As noted above.   Remaining deficits: Left arm swelling that she has not yet managed to keep under good control.   Education / Equipment: Needs and plans to obtain day- and nighttime compression garments; has a Flexitouch lymphedema pump. Plan: Patient agrees to discharge.  Patient goals were met. Patient is being discharged due to                                                     Difficulty getting to therapy sessions, and therefore making a choice to focus on getting  equipment to improve her self-care. ????  Micheline Maze, PT 02/15/17 12:58 PM

## 2017-03-13 ENCOUNTER — Inpatient Hospital Stay: Admission: RE | Admit: 2017-03-13 | Payer: BLUE CROSS/BLUE SHIELD | Source: Ambulatory Visit

## 2017-04-03 ENCOUNTER — Other Ambulatory Visit: Payer: BLUE CROSS/BLUE SHIELD

## 2017-04-13 ENCOUNTER — Telehealth: Payer: Self-pay | Admitting: Neurology

## 2017-04-13 NOTE — Telephone Encounter (Signed)
I called pt. It appears that Dr. Jannifer Franklin referred to her sleep studies at the last visit in December. Is this what the pt is referring to? If so, she should speak with Sheena to get her scheduled with Dr. Brett Fairy or Dr. Rexene Alberts.  No answer, left a message asking pt to call me back. If pt calls back, please clarify her intent.

## 2017-04-13 NOTE — Telephone Encounter (Signed)
Patient called office to see about switching care from Dr. Jannifer Franklin to Dr. Brett Fairy.  Please call

## 2017-04-15 NOTE — Telephone Encounter (Addendum)
Pt returned Rn's call. She is wanting to switch providers not only for sleep issues but for on going vertigo. She wants only 1 neurologist. Pt is aware a referral for sleep consult has been made by Dr Jannifer Franklin. She asked if Dr D will discuss the vertigo issues at that appt. I said I don't think so. Pt will wait until she gets answer reg switching providers

## 2017-04-15 NOTE — Telephone Encounter (Signed)
Okay with me to switch providers if okay with Dr. Brett Fairy.

## 2017-04-20 NOTE — Telephone Encounter (Signed)
I will not be able to take on vertigo, memory loss within the sleep clinic.  I think she will stay with one neurologist but I will be happy to test her for sleep apnea and she will return to Dr. Jannifer Franklin for her comprehensive neurologic care. CD

## 2017-04-21 NOTE — Telephone Encounter (Signed)
Left detailed message with on her voicemail informing her of the information below (ok per DPR).  Requested her to call us back if she would like to pursue the sleep consult and we are happy to get her scheduled w/ Dr. Brett Fairy.  She will then continue follow up care with Dr. Jannifer Franklin for her other needs.  Provided our number to call.

## 2017-04-21 NOTE — Telephone Encounter (Signed)
Left another message for a return call

## 2017-04-21 NOTE — Telephone Encounter (Signed)
Left message for a return call

## 2017-04-21 NOTE — Telephone Encounter (Signed)
Pt returned call and is asking to receive a call back explaining why she is unable to see just one neurologist.

## 2017-04-22 NOTE — Telephone Encounter (Signed)
Left third message for patient.  If she calls back, we are happy to schedule a sleep consult with Dr. Brett Fairy but she will need to follow-up with Dr. Jannifer Franklin for her vertigo and memory.  Please let her know that Dr. Brett Fairy is a sleep specialist and is unable to retain all sleep consultations as patients so that she is available for others with sleep disorders.  Also, depending on the result of her evaluation, she may only see Dr. Brett Fairy one time.

## 2017-04-24 ENCOUNTER — Other Ambulatory Visit: Payer: BLUE CROSS/BLUE SHIELD

## 2017-04-26 NOTE — Telephone Encounter (Signed)
Pt called back to just to inform that she will try to call back around 12:00 on 04-27-2017 to speak with RN Sharyn Lull, if a call back is made pt said a detailed message can be left on her voicemail

## 2017-04-26 NOTE — Telephone Encounter (Signed)
When patient returns call, please give message below.

## 2017-05-11 ENCOUNTER — Telehealth: Payer: Self-pay | Admitting: Neurology

## 2017-05-11 NOTE — Telephone Encounter (Signed)
Okay with me for the patient is seen Dr. Brett Fairy, she may need a sleep referral anyway, she is at risk for sleep apnea, does report some fatigue during the day.

## 2017-05-11 NOTE — Telephone Encounter (Signed)
I will only provide sleep care - this can be done by internal referral.  Not a transfer of general neurology care.  CD

## 2017-05-11 NOTE — Telephone Encounter (Signed)
Pt has apt on 05/28/17 w Dr. Jannifer Franklin but is requesting a 2nd opinion with Dr. Brett Fairy. Please advise

## 2017-05-15 ENCOUNTER — Other Ambulatory Visit: Payer: BLUE CROSS/BLUE SHIELD

## 2017-05-26 ENCOUNTER — Ambulatory Visit
Admission: RE | Admit: 2017-05-26 | Discharge: 2017-05-26 | Disposition: A | Discharge status: Home / Self Care | Payer: BLUE CROSS/BLUE SHIELD | Source: Ambulatory Visit | Attending: Neurology | Admitting: Neurology

## 2017-05-26 DIAGNOSIS — G4489 Other headache syndrome: Secondary | ICD-10-CM

## 2017-05-26 DIAGNOSIS — R413 Other amnesia: Secondary | ICD-10-CM

## 2017-05-26 MED ORDER — GADOBENATE DIMEGLUMINE 529 MG/ML IV SOLN
20.0000 mL | Freq: Once | INTRAVENOUS | Status: AC | PRN
  Administered 2017-05-26: 20 mL via INTRAVENOUS

## 2017-05-27 ENCOUNTER — Telehealth: Payer: Self-pay | Admitting: Neurology

## 2017-05-27 NOTE — Telephone Encounter (Signed)
   I called patient. The patient has a relatively unremarkable MRI the brain. She will be seen in office tomorrow, she still has a pressure sensation the head and a sensation of lightheadedness that is a daily problem.  She was on gabapentin but could not take it, we may need to try another medication such as nortriptyline for what may be a tension headache.  She has requested a referral to Dr. Brett Fairy.  MRI brain 05/27/17:  IMPRESSION:  Mild bilateral perisylvian atrophy. No acute findings. No change from MRI on 06/15/14.

## 2017-05-28 ENCOUNTER — Ambulatory Visit (INDEPENDENT_AMBULATORY_CARE_PROVIDER_SITE_OTHER): Payer: BLUE CROSS/BLUE SHIELD | Admitting: Neurology

## 2017-05-28 ENCOUNTER — Encounter: Payer: Self-pay | Admitting: Neurology

## 2017-05-28 VITALS — BP 151/97 | HR 71 | Ht 65.0 in | Wt 294.5 lb

## 2017-05-28 DIAGNOSIS — G479 Sleep disorder, unspecified: Secondary | ICD-10-CM | POA: Diagnosis not present

## 2017-05-28 DIAGNOSIS — R350 Frequency of micturition: Secondary | ICD-10-CM

## 2017-05-28 NOTE — Progress Notes (Signed)
Reason for visit: Fatigue, memory disturbance  Kimberly Reed is an 61 y.o. female  History of present illness:  Kimberly Reed is a 61 year old right-handed white female with a history of chronic fatigue and problems with memory and concentration. The patient is still working, she is able to function fairly well at work. The patient indicates that she is sleeping at night but she wakes up 3 or 4 times to use the bathroom, she is unable to void much urine when she does urinate. The patient will wake up with a dry mouth. She does report a pressure sensation the head and feels as if she has "cotton" in the head. The patient has some low-grade dizziness that is present. She underwent a MRI of the brain yesterday that was relatively unremarkable. The patient has some slight sensation of imbalance. She reports no problems with muffled hearing, she does have some slight blurring of vision in the left eye which is involved with glaucoma. She was set up for a sleep study when she was seen in December 2017, but this apparently never happened. She was placed on gabapentin for what was felt to be a tension headache, but she could not tolerate this medication. She returns to this office for further evaluation.  Past Medical History:  Diagnosis Date  . Anemia   . Anxiety   . Atypical chest pain   . Breast cancer ([facility]) 05/2014   left  . Cancer ([facility])    left breast dx. 8'29- chemo planned to start 07-09-14- Dr. [facility]i [facility]  . Depression   . Family history of heart disease   . Glaucoma   . Headache syndrome 10/13/2016  . History of syncope 2008   loss of bladder control eval by Neuro  . Hot flashes   . Hyperlipemia   . Hypothyroid   . Insomnia   . Lymphedema    L arm  . Migraine without aura   . Personal history of chemotherapy   . Personal history of radiation therapy   . Wears glasses     Past Surgical History:  Procedure Laterality Date  . BREAST BIOPSY Left 06/07/2014   malignant  . BREAST BIOPSY Left 05/24/2014   malignant  . BREAST LUMPECTOMY Left 01/30/2015   malignant  . BREAST LUMPECTOMY WITH NEEDLE LOCALIZATION AND AXILLARY LYMPH NODE DISSECTION Left 01/30/2015   Procedure: BREAST LUMPECTOMY WITH NEEDLE LOCALIZATION LEFT AXILLARY DISSECTION REMOVAL OF PORT;  Surgeon: [name], MD;  Location: [facility];  Service: General;  Laterality: Left;  . CATARACT EXTRACTION Left   . DILATION AND CURETTAGE OF UTERUS    . ESOPHAGOGASTRODUODENOSCOPY  06/01/14  . EXAMINATION UNDER ANESTHESIA  02/24/2013   Procedure: EXAM UNDER ANESTHESIA;  Surgeon: Azalia Bilis, MD;  Location: [facility];  Service: Gynecology;;  with removal of prolapsing cervical mass; pap smear and endometrial biopsy  . GLAUCOMA SURGERY Left    x1   . PORTACATH PLACEMENT Right 07/06/2014   Procedure: PORT A CATH  PLACEMENT;  Surgeon: [name], MD;  Location: [facility];  Service: General;  Laterality: Right;  . TONSILLECTOMY     child  . uterine polyp removed     per hysteroscopy about 1 1/2 yrs ago.    Family History  Problem Relation Age of Onset  . Aneurysm Mother   . Kidney disease Mother   . Hypertension Father   . Dementia Father   . Kidney disease Brother   . Heart disease  Unknown        "all of moms side"  . Depression Unknown   . Heart failure Cousin     Social history:  reports that she has never smoked. She has never used smokeless tobacco. She reports that she does not drink alcohol or use drugs.    Allergies  Allergen Reactions  . Other Nausea Only and Other (See Comments)    Antibiotics - Pt cannot identify which antibiotics she reacts to.  Make her nausous and gives her headache.  . Effexor [Venlafaxine] Nausea And Vomiting    Medications:  Prior to Admission medications   Medication Sig Start Date End Date Taking? Authorizing Provider  brimonidine-timolol (COMBIGAN) 0.2-0.5 % ophthalmic solution Place 1 drop into the right eye every 12  (twelve) hours.    Yes [provider]  dorzolamide (TRUSOPT) 2 % ophthalmic solution Place 1 drop into the right eye 2 (two) times daily.    Yes [provider]  ibuprofen (ADVIL,MOTRIN) 200 MG tablet Take 200 mg by mouth every 6 (six) hours as needed.   Yes [provider]  levothyroxine (SYNTHROID, LEVOTHROID) 112 MCG tablet  09/25/16  Yes [provider]  non-metallic deodorant Jethro Poling) MISC Apply 1 application topically daily as needed.   Yes [provider]  OVER THE COUNTER MEDICATION Take 1 capsule by mouth daily.   Yes [provider]  Travoprost, BAK Free, (TRAVATAN) 0.004 % SOLN ophthalmic solution Place 1 drop into the right eye at bedtime.    Yes [provider]    ROS:  Out of a complete 14 system review of symptoms, the patient complains only of the following symptoms, and all other reviewed systems are negative.  Decreased activity, fatigue Blurred vision Heat intolerance, flushing Black stools, diarrhea, rectal pain Frequent waking, daytime sleepiness Frequency of urination, urine decrease Back pain, walking difficulty, neck pain, neck stiffness Itching Memory loss, dizziness, headache, weakness Agitation, behavior problem, confusion, decreased concentration, depression, anxiety   Blood pressure (!) 151/97, pulse 71, height 5\' 5"  (1.651 m), weight 294 lb 8 oz (133.6 kg), last menstrual period 02/15/2013.  Physical Exam  General: The patient is alert and cooperative at the time of the examination. The patient is markedly obese.  Skin: No significant peripheral edema is noted.   Neurologic Exam  Mental status: The patient is alert and oriented x 3 at the time of the examination. The patient has apparent normal recent and remote memory, with an apparently normal attention span and concentration ability. Mini-Mental Status Examination done today shows a total score of 30/30.   Cranial nerves: Facial  symmetry is present. Speech is normal, no aphasia or dysarthria is noted. Extraocular movements are full. Visual fields are full.  Motor: The patient has good strength in all 4 extremities.  Sensory examination: Soft touch sensation is symmetric on the face, arms, and legs.  Coordination: The patient has good finger-nose-finger and heel-to-shin bilaterally.  Gait and station: The patient has a normal gait. Tandem gait is normal. Romberg is negative. No drift is seen.  Reflexes: Deep tendon reflexes are symmetric.   MRI brain 05/27/17:  IMPRESSION:  Mild bilateral perisylvian atrophy. No acute findings. No change from MRI on 06/15/14.   * MRI scan images were reviewed online. I agree with the written report.    Assessment/Plan:  1. Chronic fatigue, daytime drowsiness  2. Chronic dizziness, pressure sensation, probable muscle tension headache  3. Urinary frequency  4. Reported memory disturbance  The patient  has had ongoing problems with fatigue and drowsiness, and decreased concentration. The patient will be sent for a sleep evaluation. The patient will also be sent for an evaluation through urology for increased urinary frequency. The reported memory disturbance is likely related to a sleep problem.   Jill Alexanders MD 05/28/2017 12:36 PM  [facility] [address] [place], [place] [address]  Phone [phone] Fax [phone]

## 2017-05-28 NOTE — Patient Instructions (Signed)
   We will get a referral for a sleep evaluation.

## 2017-06-01 ENCOUNTER — Telehealth: Payer: Self-pay | Admitting: Neurology

## 2017-06-01 NOTE — Telephone Encounter (Signed)
Called pt to see if she would be available to come in for a 1 pm apt on Wednesday 06/02/2017 instead of Thursday. Dr Brett Fairy will be out of the office on Thursday afternoon and needing to reschedule

## 2017-06-03 ENCOUNTER — Encounter: Payer: Self-pay | Admitting: Neurology

## 2017-06-03 ENCOUNTER — Ambulatory Visit (INDEPENDENT_AMBULATORY_CARE_PROVIDER_SITE_OTHER): Payer: BLUE CROSS/BLUE SHIELD | Admitting: Neurology

## 2017-06-03 ENCOUNTER — Institutional Professional Consult (permissible substitution): Payer: BLUE CROSS/BLUE SHIELD | Admitting: Neurology

## 2017-06-03 VITALS — BP 155/98 | HR 73 | Ht 65.0 in | Wt 294.0 lb

## 2017-06-03 DIAGNOSIS — E669 Obesity, unspecified: Secondary | ICD-10-CM | POA: Diagnosis not present

## 2017-06-03 DIAGNOSIS — R002 Palpitations: Secondary | ICD-10-CM | POA: Insufficient documentation

## 2017-06-03 DIAGNOSIS — R0683 Snoring: Secondary | ICD-10-CM | POA: Diagnosis not present

## 2017-06-03 DIAGNOSIS — R51 Headache: Secondary | ICD-10-CM | POA: Diagnosis not present

## 2017-06-03 DIAGNOSIS — R519 Headache, unspecified: Secondary | ICD-10-CM | POA: Insufficient documentation

## 2017-06-03 DIAGNOSIS — R682 Dry mouth, unspecified: Secondary | ICD-10-CM | POA: Diagnosis not present

## 2017-06-03 NOTE — Progress Notes (Signed)
SLEEP MEDICINE CLINIC   Provider:  Larey Seat, [place] D  Primary Care Physician:  [name], MD   Referring Provider: Dr. Jannifer Franklin    Chief Complaint  Patient presents with  . New Patient (Initial Visit)    alone, wake up with dry mouth and waking up multiple times thruout the night. always feel tired and stressing out.    HPI:  Kimberly Reed is a 61 y.o. female , seen here as in a referral/ revisit  from Dr. Jannifer Franklin for a sleep apnea evaluation.  Chief complaint according to patient : " I had insomnia last year, not now, but I may have apnea"  P M Hx: see Epic note. Patient is confused, distracted .  She did not fill any paper work out, and she is on her phone calling her work place while in exam room 10.   Last year she had chronic insomnia for many many months, she did have limited daytime functioning at work, but she feels cognitively more impaired now..  Sleep habits are as follows: On average her most common bedtime is between 10 and 10:30 PM, she does currently not have trouble falling asleep, mostly promptly asleep at this time. She can sleep through until 2 or 3 AM and she has her first bathroom break, a total of 2-3 times each night. She rises in the morning at 6:30, she is often spontaneously awake before her alarm rings at that time. She feels groggy, not restored not refreshed. Due to lymphedema in her left arm she has also problems finding a comfortable sleep position. She wakes up with headaches but the headaches do not wake her. She wears an elastic sleeve. At night she has a Velcro brace on her left arm, and she feels as if the time of beginning to use a Velcro brace has been the time when she started to have more vivid dreams, strange and sometimes lucid dreams. These dreams are emotionally taxing, and they seem to be triggered by emotions. But not nightmarish. She gets between 4 and 6 hours of sleep, describes her sleep as fragmented. On weekends, she sleeps all day  and night and feels even more groggy- not refreshed.  She thinks she snores, she has a dry mouth. Mondays are difficult at work.   Current medications include eyedrops, ibuprofen, levothyroxine.    Sleep medical history and family sleep history:   Mother was super-obese, snored and was very likely apnoeic.    Social history: Therapist, art , Research scientist (physical sciences). No tobacco, no alcohol use, Caffeine -  Drinks 16 ounces in AM.   Review of Systems: Out of a complete 14 system review, the patient complains of only the following symptoms, and all other reviewed systems are negative. Vertigo, nausea, cannot think, cannot stand, perseveration.   Epworth score   , Fatigue severity score  , depression score- none answered.   Social History   Social History  . Marital status: Single    Spouse name: N/A  . Number of children: 0  . Years of education: Some college   Occupational History  . Customer service [employer]   Social History Main Topics  . Smoking status: Never Smoker  . Smokeless tobacco: Never Used  . Alcohol use No  . Drug use: No     Comment: in 20's but not now  . Sexual activity: Yes    Partners: Female     Comment: menarche age 88, P 0, no HRT  Other Topics Concern  . Not on file   Social History Narrative   Social :  [place]      For over a year.     Hh of 1  2 cats      No tad    8 hours of sleep.       On  disabilirty for  Breast cancer .   Work now  72- 40 per week       Right-handed   Caffeine: large cup of coffee each morning          Family History  Problem Relation Age of Onset  . Aneurysm Mother   . Kidney disease Mother   . Hypertension Father   . Dementia Father   . Kidney disease Brother   . Heart disease Unknown        "all of moms side"  . Depression Unknown   . Heart failure Cousin     Past Medical History:  Diagnosis Date  . Anemia   . Anxiety   . Atypical chest pain   . Breast cancer ([facility]) 05/2014    left  . Cancer ([facility])    left breast dx. 5'64- chemo planned to start 07-09-14- Dr. Lindi Adie[facility]  . Depression   . Family history of heart disease   . Glaucoma   . Headache syndrome 10/13/2016  . History of syncope 2008   loss of bladder control eval by Neuro  . Hot flashes   . Hyperlipemia   . Hypothyroid   . Insomnia   . Lymphedema    L arm  . Migraine without aura   . Personal history of chemotherapy   . Personal history of radiation therapy   . Wears glasses     Past Surgical History:  Procedure Laterality Date  . BREAST BIOPSY Left 06/07/2014   malignant  . BREAST BIOPSY Left 05/24/2014   malignant  . BREAST LUMPECTOMY Left 01/30/2015   malignant  . BREAST LUMPECTOMY WITH NEEDLE LOCALIZATION AND AXILLARY LYMPH NODE DISSECTION Left 01/30/2015   Procedure: BREAST LUMPECTOMY WITH NEEDLE LOCALIZATION LEFT AXILLARY DISSECTION REMOVAL OF PORT;  Surgeon: [name], MD;  Location: [facility];  Service: General;  Laterality: Left;  . CATARACT EXTRACTION Left   . DILATION AND CURETTAGE OF UTERUS    . ESOPHAGOGASTRODUODENOSCOPY  06/01/14  . EXAMINATION UNDER ANESTHESIA  02/24/2013   Procedure: EXAM UNDER ANESTHESIA;  Surgeon: Azalia Bilis, MD;  Location: [facility];  Service: Gynecology;;  with removal of prolapsing cervical mass; pap smear and endometrial biopsy  . GLAUCOMA SURGERY Left    x1   . PORTACATH PLACEMENT Right 07/06/2014   Procedure: PORT A CATH  PLACEMENT;  Surgeon: [name], MD;  Location: [facility];  Service: General;  Laterality: Right;  . TONSILLECTOMY     child  . uterine polyp removed     per hysteroscopy about 1 1/2 yrs ago.    Current Outpatient Prescriptions  Medication Sig Dispense Refill  . brimonidine-timolol (COMBIGAN) 0.2-0.5 % ophthalmic solution Place 1 drop into the right eye every 12 (twelve) hours.     . dorzolamide (TRUSOPT) 2 % ophthalmic solution Place 1 drop into the right eye 2 (two) times daily.       [name] ibuprofen (ADVIL,MOTRIN) 200 MG tablet Take 200 mg by mouth every 6 (six) hours as needed.    [name] levothyroxine (SYNTHROID, LEVOTHROID) 112 MCG tablet     .  non-metallic deodorant (ALRA) MISC Apply 1 application topically daily as needed.    [name] OVER THE COUNTER MEDICATION Take 1 capsule by mouth daily.    . Travoprost, BAK Free, (TRAVATAN) 0.004 % SOLN ophthalmic solution Place 1 drop into the right eye at bedtime.      No current facility-administered medications for this visit.     Allergies as of 06/03/2017 - Review Complete 06/03/2017  Allergen Reaction Noted  . Other Nausea Only and Other (See Comments) 06/06/2014  . Effexor [venlafaxine] Nausea And Vomiting 05/15/2013    Vitals: BP (!) 155/98   Pulse 73   Ht 5\' 5"  (1.651 m)   Wt 294 lb (133.4 kg)   LMP 02/15/2013   BMI 48.92 kg/m  Last Weight:  Wt Readings from Last 1 Encounters:  06/03/17 294 lb (133.4 kg)   JKD:TOIZ mass index is 48.92 kg/m.     Last Height:   Ht Readings from Last 1 Encounters:  06/03/17 5\' 5"  (1.651 m)    Physical exam:  General: The patient is awake, alert and appears not in acute distress. The patient is well groomed. Wears a wig.  Head: Normocephalic, atraumatic. Neck is supple. Mallampati 4,  neck circumference:17. Nasal airflow patent , TMJ click  not evident . Retrognathia is not seen.  Cardiovascular:  Regular rate and rhythm, without  murmurs or carotid bruit, and without distended neck veins. Respiratory: Lungs are clear to auscultation. Skin:  Without evidence of edema, or rash Trunk: BMI is super obese  Neurologic exam : The patient is confused and not fully alert.   MOCA:No flowsheet data found. MMSE: MMSE - Mini Mental State Exam 05/28/2017 10/13/2016  Orientation to time 5 5  Orientation to Place 5 5  Registration 3 3  Attention/ Calculation 5 5  Recall 3 3  Language- name 2 objects 2 2  Language- repeat 1 1  Language- follow 3 step command 3 3  Language- read & follow  direction 1 1  Write a sentence 1 1  Copy design 1 1  Total score 30 30     Attention span & concentration ability appears limited. Speech is fluent,  without  dysarthria, dysphonia or aphasia.  Mood and affect are anxious.   Cranial nerves: Pupils are equal and briskly reactive to light. Funduscopic exam with evidence  Glaucoma on the left - surgery, . Extraocular movements  in vertical and horizontal planes intact and without nystagmus. Visual fields by finger perimetry are intact.Hearing to finger rub intact.  Facial sensation intact to fine touch. Facial motor strength is symmetric and tongue and uvula move midline. Shoulder shrug was symmetrical.  Motor exam:   Normal tone, muscle bulk and symmetric strength in all extremities. Good grip strength.  Sensory:  Fine touch, pinprick and vibration were tested in all extremities. Proprioception tested in the upper extremities was normal. Coordination: Rapid alternating movements in the fingers/hands was normal. Finger-to-nose maneuver  normal without evidence of ataxia, dysmetria or tremor. Gait and station: Patient walks without assistive device . Shuffling gait , wide based. Deep tendon reflexes: in the  upper and lower extremities are symmetric and intact. .   Assessment:  After physical and neurologic examination, review of laboratory studies,  Personal review of imaging studies, reports of other /same  Imaging studies, results of polysomnography and / or neurophysiology testing and pre-existing records as far as provided in visit., my assessment is   1) Mrs. Sealey endorsed the Epworth sleepiness score at 13 out  of 24 points today, she reports gaining almost 100 pounds following her breast cancer diagnosis and chemotherapy. She wakes up with a dry mouth which could be medication related, but a review of her medication does not reveal any use tricyclics, antihistamines , etc. she has noticed a dry mouth much more over the last 18 months.  She has been told that she snores but she sleeps alone. I would like for her to be evaluated for the presence of sleep apnea. This evaluation was not an insomnia evaluation as the patient is no longer suffering from insomnia.  e patient was advised of the nature of the diagnosed disorder , the treatment options and the  risks for general health and wellness arising from not treating the condition.   I spent more than 45  minutes of face to face time with the patient.  Greater than 50% of time was spent in counseling and coordination of care. We have discussed the diagnosis and differential and I answered the patient's questions.    Plan:  Treatment plan and additional workup :  HST evaluation for rule in / out apnea. Hypoxemia,     Larey Seat, MD 12/03/4130, 4:40 AM  Certified in Neurology by ABPN Certified in [place] by [facility] [address], [place], [place] [address]

## 2017-06-22 ENCOUNTER — Institutional Professional Consult (permissible substitution): Payer: BLUE CROSS/BLUE SHIELD | Admitting: Neurology

## 2017-07-05 ENCOUNTER — Institutional Professional Consult (permissible substitution): Payer: BLUE CROSS/BLUE SHIELD | Admitting: Neurology

## 2017-08-10 ENCOUNTER — Emergency Department (HOSPITAL_COMMUNITY)
Admission: EM | Admit: 2017-08-10 | Discharge: 2017-08-10 | Discharge status: Absent without leave | Payer: BLUE CROSS/BLUE SHIELD

## 2017-08-10 LAB — CBC AND DIFFERENTIAL
HCT: 40 (ref 36–46)
Hemoglobin: 13.2 (ref 12.0–16.0)
PLATELETS: 310 (ref 150–399)
WBC: 7.6

## 2017-08-10 LAB — BASIC METABOLIC PANEL
BUN: 18 (ref 4–21)
CREATININE: 0.8 (ref 0.5–1.1)
Glucose: 84
POTASSIUM: 4.7 (ref 3.4–5.3)
SODIUM: 141 (ref 137–147)

## 2017-08-10 LAB — HEPATIC FUNCTION PANEL
ALT: 57 — AB (ref 7–35)
AST: 50 — AB (ref 13–35)

## 2017-08-19 ENCOUNTER — Telehealth: Payer: Self-pay | Admitting: Neurology

## 2017-08-19 NOTE — Telephone Encounter (Signed)
We have attempted to call the patient 2 times to schedule sleep study. Patient has been unavailable at the phone numbers we have on file and has not returned our calls. At this point we will send a letter asking pt to please contact the sleep lab to schedule their sleep study. If patient calls back we will schedule them for their sleep study. ° °

## 2017-09-29 ENCOUNTER — Other Ambulatory Visit: Payer: Self-pay | Admitting: Hematology and Oncology

## 2017-09-29 DIAGNOSIS — R921 Mammographic calcification found on diagnostic imaging of breast: Secondary | ICD-10-CM

## 2018-01-10 ENCOUNTER — Telehealth: Payer: Self-pay | Admitting: *Deleted

## 2018-01-10 ENCOUNTER — Telehealth: Payer: Self-pay

## 2018-01-10 LAB — CBC AND DIFFERENTIAL
HCT: 38 (ref 36–46)
Hemoglobin: 12.4 (ref 12.0–16.0)
Platelets: 363 (ref 150–399)
WBC: 12.9

## 2018-01-10 LAB — BASIC METABOLIC PANEL
CREATININE: 0.9 (ref 0.5–1.1)
Glucose: 99

## 2018-01-10 NOTE — Telephone Encounter (Signed)
Returned pt call regarding arm swelling. Pt was very emotional over the phone. Stating no one was helping her. Pt has been to the lymphedema clinic several times. She stated she does not like them there. She has went to several PCPs and a neurologist and she stated they do not help either. She is having several other symptoms not related to lymphedema. Scheduled her to see Dr. Lindi Adie tomorrow regarding the swelling.  Cyndia Bent RN

## 2018-01-10 NOTE — Telephone Encounter (Signed)
"  A patient of Dr. Geralyn Flash a few years ago.  Trouble with my right arm that has accelerated since Friday to the level I'm not able to use it.  Call [phone]."

## 2018-01-11 ENCOUNTER — Inpatient Hospital Stay: Payer: BLUE CROSS/BLUE SHIELD

## 2018-01-11 ENCOUNTER — Telehealth: Payer: Self-pay | Admitting: Hematology and Oncology

## 2018-01-11 ENCOUNTER — Inpatient Hospital Stay: Payer: BLUE CROSS/BLUE SHIELD | Attending: Hematology and Oncology | Admitting: Hematology and Oncology

## 2018-01-11 DIAGNOSIS — C50412 Malignant neoplasm of upper-outer quadrant of left female breast: Secondary | ICD-10-CM | POA: Diagnosis not present

## 2018-01-11 DIAGNOSIS — C7951 Secondary malignant neoplasm of bone: Secondary | ICD-10-CM

## 2018-01-11 DIAGNOSIS — Z170 Estrogen receptor positive status [ER+]: Secondary | ICD-10-CM | POA: Diagnosis not present

## 2018-01-11 DIAGNOSIS — C773 Secondary and unspecified malignant neoplasm of axilla and upper limb lymph nodes: Secondary | ICD-10-CM

## 2018-01-11 LAB — CBC WITH DIFFERENTIAL (CANCER CENTER ONLY)
Basophils Absolute: 0 10*3/uL (ref 0.0–0.1)
Basophils Relative: 0 %
EOS ABS: 0.1 10*3/uL (ref 0.0–0.5)
EOS PCT: 0 %
HCT: 37.9 % (ref 34.8–46.6)
HEMOGLOBIN: 12.7 g/dL (ref 11.6–15.9)
LYMPHS ABS: 2.1 10*3/uL (ref 0.9–3.3)
Lymphocytes Relative: 13 %
MCH: 29.7 pg (ref 25.1–34.0)
MCHC: 33.5 g/dL (ref 31.5–36.0)
MCV: 88.6 fL (ref 79.5–101.0)
MONO ABS: 1.3 10*3/uL — AB (ref 0.1–0.9)
MONOS PCT: 8 %
NEUTROS PCT: 79 %
Neutro Abs: 12.8 10*3/uL — ABNORMAL HIGH (ref 1.5–6.5)
Platelet Count: 409 10*3/uL — ABNORMAL HIGH (ref 145–400)
RBC: 4.28 MIL/uL (ref 3.70–5.45)
RDW: 16 % — AB (ref 11.2–14.5)
WBC Count: 16.3 10*3/uL — ABNORMAL HIGH (ref 3.9–10.3)

## 2018-01-11 LAB — CMP (CANCER CENTER ONLY)
ALBUMIN: 2.5 g/dL — AB (ref 3.5–5.0)
ALK PHOS: 315 U/L — AB (ref 40–150)
ALT: 41 U/L (ref 0–55)
AST: 90 U/L — AB (ref 5–34)
Anion gap: 12 — ABNORMAL HIGH (ref 3–11)
BUN: 21 mg/dL (ref 7–26)
CHLORIDE: 101 mmol/L (ref 98–109)
CO2: 22 mmol/L (ref 22–29)
CREATININE: 1.16 mg/dL — AB (ref 0.60–1.10)
Calcium: 10.5 mg/dL — ABNORMAL HIGH (ref 8.4–10.4)
GFR, Est AFR Am: 57 mL/min — ABNORMAL LOW (ref >60)
GFR, Estimated: 49 mL/min — ABNORMAL LOW (ref >60)
GLUCOSE: 113 mg/dL (ref 70–140)
Potassium: 3.7 mmol/L (ref 3.5–5.1)
SODIUM: 135 mmol/L — AB (ref 136–145)
Total Bilirubin: 1.4 mg/dL — ABNORMAL HIGH (ref 0.2–1.2)
Total Protein: 7.7 g/dL (ref 6.4–8.3)

## 2018-01-11 LAB — CORTISOL: Cortisol, Plasma: 17.7 ug/dL

## 2018-01-11 NOTE — Telephone Encounter (Signed)
Gave patient AVs and calendar of upcoming April appointments.  °

## 2018-01-11 NOTE — Assessment & Plan Note (Signed)
Metastatic breast cancer in the left breast involving left axillary lymph nodes and isolated L1 vertebral metastases stage IV disease, ER 100 percent, PR 4%, HER-2 negative ratio 1.3 Ki-67 15%  Chemotherapy summary: Completed dose dense Adriamycin Cytoxan 4 started 07/26/2014. Completed 12 weeks of Taxol by 12/18/2014. Left lumpectomy 01/30/2015: Invasive ductal carcinoma grade 2, 1.2 cm, 12/15 lymph nodes positive, extracapsular tumor extension present, T1c N3M1 stage IV Status post radiation completed 04/24/2015  Current treatment: Anastrozole since July 2016 discontinued December 2016, switched to tamoxifen 04/07/2016  Chest tightness, fatigue, dizziness, weakness, throbbing sensation: Patient was very concerned of cancer recurrence.  CT CAP 01/10/16: No evidence of metastatic disease, 3 mm nonobstructing left renal calculus Dizziness and confusion with short-term memory loss:   Breast Cancer Surveillance: 1. Breast exam 01/11/2018: Normal 2. Mammogram 02/11/2017: 2 mm calcifications stable.   Return to clinic in 1 year for follow-up

## 2018-01-11 NOTE — Progress Notes (Signed)
Patient Care Team: [name], MD as PCP - General (Family Medicine) Nicholas Lose, MD as Consulting Physician (Hematology and Oncology) [name], MD as Consulting Physician (General Surgery) Eppie Gibson, MD as Attending Physician (Radiation Oncology)  DIAGNOSIS:  Encounter Diagnosis  Name Primary?  . Malignant neoplasm of upper-outer quadrant of left breast in female, estrogen receptor positive ([place])     SUMMARY OF ONCOLOGIC HISTORY:   Breast cancer of upper-outer quadrant of left female breast ([place])   05/18/2014 Mammogram    Suspicious left upper outer quadrant 5 cm segmental area of calcifications.       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      05/30/2014 Breast MRI    Left Breast: 12oclock: 21 x 23 x 30 mm, Numerous  level 1 and 2 LN; Liver lesions cycts by Liver MRI      06/07/2014 Initial Biopsy    Left axillary lymph node biopsy invasive ductal carcinoma ER 100%, PR 11% Ki-67 15% HER-2 negative ratio 1.41      06/13/2014 PET scan    left breast activity, hypermetabolic left axillary and left retropectoral lymph node, small lucent lesion in L1 vertebra which was biopsied and proven to be bone metastases      07/19/2014 Initial Biopsy    Biopsy of L1 vertebra metastatic carcinoma breast primary, ER 100%, PR 4%, HER-2 negative ratio 1.3      07/26/2014 - 12/18/2014 Neo-Adjuvant Chemotherapy    Even though she has L1 solitary bone metastases, patient is being treated definitively with neoadjuvant chemotherapy with dose dense Adriamycin and Cytoxan to be followed by weekly Taxol x12      12/25/2014 PET scan    Interval improvement in size and metabolic activity associated with left axillary lymph nodes consistent with treatment response, no hypermetabolic distant metastases, L1 vertebral body has no activity      12/25/2014 Breast MRI    No residual enhancement  left breast, decreased left axillary lymphadenopathy, indicating response to treatment but lymph nodes are still present      01/30/2015 Surgery    Left breast lumpectomy: Invasive ductal carcinoma grade 2, 1.2 cm, 12/15 lymph nodes positive, extracapsular tumor extension present, T1c N3M1 stage IV       03/12/2015 - 04/24/2015 Radiation Therapy     adjuvant radiation therapy with Dr. Isidore Moos      05/16/2015 -  Anti-estrogen oral therapy    Anastrozole 1 mg daily 10 years is the plan; Kimberly Reed was recommended but she was not reachable and hence it was not filled, switched to tamoxifen 04/07/2016      01/10/2016 Imaging    CT chest abdomen pelvis: No evidence of metastatic disease       CHIEF COMPLIANT: Follow-up of metastatic breast cancer on antiestrogen therapy with tamoxifen  INTERVAL HISTORY: Kimberly Reed is a 62 year Reed with above-mentioned history of metastatic breast cancer with bone metastases that was treated with neoadjuvant chemotherapy with dose dense Adriamycin and Cytoxan followed by Taxol x12.  She is currently on oral antiestrogen therapy after completing adjuvant radiation.  We had to switch her treatment from anastrozole to tamoxifen.  Previously recommended Ibrance but she was not interested in taking it.  Scans done on follow-up have not show any progression of disease.  She has not had any scans since March 2017.  Brain MRI done in August 2018 did not show any evidence of metastatic  disease.  Mammograms done in April 2018 shows stable 2 mm benign calcifications in the left breast. Her biggest complaints are related to left arm lymphedema, fatigue issues.  She is working full time at [employer] and is having a hard time with her health issues.   REVIEW OF SYSTEMS:   Constitutional: Denies fevers, chills or abnormal weight loss Eyes: Denies blurriness of vision Ears, nose, mouth, throat, and face: Denies mucositis or sore throat Respiratory: Denies cough, dyspnea or  wheezes Cardiovascular: Denies palpitation, chest discomfort Gastrointestinal:  Denies nausea, heartburn or change in bowel habits Skin: Denies abnormal skin rashes Lymphatics: Denies new lymphadenopathy or easy bruising Neurological:Denies numbness, tingling or new weaknesses Behavioral/Psych: Mood is stable, no new changes  Extremities: Profound left arm lymphedema, generalized weakness  All other systems were reviewed with the patient and are negative.  I have reviewed the past medical history, past surgical history, social history and family history with the patient and they are unchanged from previous note.  ALLERGIES:  is allergic to other and effexor [venlafaxine].  MEDICATIONS:  Current Outpatient Medications  Medication Sig Dispense Refill  . brimonidine-timolol (COMBIGAN) 0.2-0.5 % ophthalmic solution Place 1 drop into the right eye every 12 (twelve) hours.     . dorzolamide (TRUSOPT) 2 % ophthalmic solution Place 1 drop into the right eye 2 (two) times daily.     [name] ibuprofen (ADVIL,MOTRIN) 200 MG tablet Take 200 mg by mouth every 6 (six) hours as needed.    [name] levothyroxine (SYNTHROID, LEVOTHROID) 112 MCG tablet     . non-metallic deodorant (ALRA) MISC Apply 1 application topically daily as needed.    [name] OVER THE COUNTER MEDICATION Take 1 capsule by mouth daily.    . Travoprost, BAK Free, (TRAVATAN) 0.004 % SOLN ophthalmic solution Place 1 drop into the right eye at bedtime.      No current facility-administered medications for this visit.     PHYSICAL EXAMINATION: ECOG PERFORMANCE STATUS: 1 - Symptomatic but completely ambulatory  There were no vitals filed for this visit. There were no vitals filed for this visit.  GENERAL:alert, no distress and comfortable SKIN: skin color, texture, turgor are normal, no rashes or significant lesions EYES: normal, Conjunctiva are pink and non-injected, sclera clear OROPHARYNX:no exudate, no erythema and lips, buccal mucosa, and  tongue normal  NECK: supple, thyroid normal size, non-tender, without nodularity LYMPH:  no palpable lymphadenopathy in the cervical, axillary or inguinal LUNGS: clear to auscultation and percussion with normal breathing effort HEART: regular rate & rhythm and no murmurs and no lower extremity edema ABDOMEN:abdomen soft, non-tender and normal bowel sounds MUSCULOSKELETAL:no cyanosis of digits and no clubbing  NEURO: alert & oriented x 3 with fluent speech, no focal motor/sensory deficits EXTREMITIES: Severe left arm lymphedema   LABORATORY DATA:  I have reviewed the data as listed CMP Latest Ref Rng & Units 05/01/2016 01/10/2016 08/22/2015  Glucose 70 - 140 mg/dl - 115 91  BUN 7.0 - 26.0 mg/dL - 18.1 17.6  Creatinine 0.40 - 1.20 mg/dL 0.84 1.0 0.7  Sodium 136 - 145 mEq/L - 141 138  Potassium 3.5 - 5.1 mEq/L - 3.8 4.1  Chloride 96 - 112 mEq/L - - -  CO2 22 - 29 mEq/L - 29 24  Calcium 8.4 - 10.4 mg/dL - 10.1 9.8  Total Protein 6.4 - 8.3 g/dL - 7.4 7.0  Total Bilirubin 0.20 - 1.20 mg/dL - 0.50 0.30  Alkaline Phos 40 - 150 U/L - 92 93  AST 5 -  34 U/L - 15 13  ALT 0 - 55 U/L - 17 11    Lab Results  Component Value Date   WBC 5.7 05/01/2016   HGB 12.3 05/01/2016   HCT 37.3 05/01/2016   MCV 89.4 05/01/2016   PLT 333.0 05/01/2016   NEUTROABS 3.1 05/01/2016    ASSESSMENT & PLAN:  Breast cancer of upper-outer quadrant of left female breast ([place]) Metastatic breast cancer in the left breast involving left axillary lymph nodes and isolated L1 vertebral metastases stage IV disease, ER 100 percent, PR 4%, HER-2 negative ratio 1.3 Ki-67 15%  Chemotherapy summary: Completed dose dense Adriamycin Cytoxan 4 started 07/26/2014. Completed 12 weeks of Taxol by 12/18/2014. Left lumpectomy 01/30/2015: Invasive ductal carcinoma grade 2, 1.2 cm, 12/15 lymph nodes positive, extracapsular tumor extension present, T1c N3M1 stage IV Status post radiation completed 04/24/2015  Current treatment:  Anastrozole since July 2016 discontinued December 2016, switched to tamoxifen 06/13/2017discontinued by patient preference  CT CAP 01/10/16: No evidence of metastatic disease, 3 mm nonobstructing left renal calculus Dizziness and confusion with short-term memory loss: Previous brain MRIs were normal  Breast Cancer Surveillance: 1. Breast exam 01/11/2018: Normal 2. Mammogram 02/11/2017: 2 mm calcifications stable.   Profound left arm lymphedema: I gave her a new prescription for sleeve she will make an appointment with physical therapy Severe fatigue with hypotension: I will check a serum cortisol level today.  Patient will apply for short-term disability to get her health in order.  She will also need CT chest abdomen pelvis for restaging. Return to clinic after the scans   I spent 25 minutes talking to the patient of which more than half was spent in counseling and coordination of care.  No orders of the defined types were placed in this encounter.  The patient has a good understanding of the overall plan. she agrees with it. she will call with any problems that may develop before the next visit here.   Harriette Ohara, MD 01/11/18

## 2018-01-12 ENCOUNTER — Telehealth: Payer: Self-pay

## 2018-01-12 ENCOUNTER — Telehealth: Payer: Self-pay | Admitting: General Practice

## 2018-01-12 ENCOUNTER — Inpatient Hospital Stay (HOSPITAL_COMMUNITY)
Admission: EM | Admit: 2018-01-12 | Discharge: 2018-01-14 | DRG: 683 | Disposition: A | Discharge status: Home / Self Care | Payer: BLUE CROSS/BLUE SHIELD | Attending: Internal Medicine | Admitting: Internal Medicine

## 2018-01-12 ENCOUNTER — Encounter (HOSPITAL_COMMUNITY): Payer: Self-pay | Admitting: *Deleted

## 2018-01-12 ENCOUNTER — Other Ambulatory Visit: Payer: Self-pay

## 2018-01-12 DIAGNOSIS — T39395A Adverse effect of other nonsteroidal anti-inflammatory drugs [NSAID], initial encounter: Secondary | ICD-10-CM | POA: Diagnosis present

## 2018-01-12 DIAGNOSIS — Z6841 Body Mass Index (BMI) 40.0 and over, adult: Secondary | ICD-10-CM

## 2018-01-12 DIAGNOSIS — H409 Unspecified glaucoma: Secondary | ICD-10-CM | POA: Diagnosis present

## 2018-01-12 DIAGNOSIS — Z95828 Presence of other vascular implants and grafts: Secondary | ICD-10-CM

## 2018-01-12 DIAGNOSIS — I890 Lymphedema, not elsewhere classified: Secondary | ICD-10-CM

## 2018-01-12 DIAGNOSIS — Z881 Allergy status to other antibiotic agents status: Secondary | ICD-10-CM

## 2018-01-12 DIAGNOSIS — Z170 Estrogen receptor positive status [ER+]: Principal | ICD-10-CM

## 2018-01-12 DIAGNOSIS — Z923 Personal history of irradiation: Secondary | ICD-10-CM

## 2018-01-12 DIAGNOSIS — C50412 Malignant neoplasm of upper-outer quadrant of left female breast: Secondary | ICD-10-CM

## 2018-01-12 DIAGNOSIS — Z889 Allergy status to unspecified drugs, medicaments and biological substances status: Secondary | ICD-10-CM

## 2018-01-12 DIAGNOSIS — N179 Acute kidney failure, unspecified: Secondary | ICD-10-CM | POA: Diagnosis not present

## 2018-01-12 DIAGNOSIS — Z9221 Personal history of antineoplastic chemotherapy: Secondary | ICD-10-CM

## 2018-01-12 DIAGNOSIS — E860 Dehydration: Secondary | ICD-10-CM | POA: Diagnosis present

## 2018-01-12 DIAGNOSIS — Z888 Allergy status to other drugs, medicaments and biological substances status: Secondary | ICD-10-CM

## 2018-01-12 DIAGNOSIS — E039 Hypothyroidism, unspecified: Secondary | ICD-10-CM | POA: Diagnosis present

## 2018-01-12 DIAGNOSIS — D72828 Other elevated white blood cell count: Secondary | ICD-10-CM | POA: Diagnosis present

## 2018-01-12 DIAGNOSIS — D638 Anemia in other chronic diseases classified elsewhere: Secondary | ICD-10-CM | POA: Diagnosis present

## 2018-01-12 DIAGNOSIS — Z79899 Other long term (current) drug therapy: Secondary | ICD-10-CM

## 2018-01-12 DIAGNOSIS — C7951 Secondary malignant neoplasm of bone: Secondary | ICD-10-CM | POA: Diagnosis present

## 2018-01-12 DIAGNOSIS — R402414 Glasgow coma scale score 13-15, 24 hours or more after hospital admission: Secondary | ICD-10-CM | POA: Diagnosis not present

## 2018-01-12 DIAGNOSIS — N390 Urinary tract infection, site not specified: Secondary | ICD-10-CM

## 2018-01-12 LAB — CBG MONITORING, ED: GLUCOSE-CAPILLARY: 90 mg/dL (ref 65–99)

## 2018-01-12 MED ORDER — SODIUM CHLORIDE 0.9 % IV BOLUS (SEPSIS)
1000.0000 mL | Freq: Once | INTRAVENOUS | Status: AC
  Administered 2018-01-13: 1000 mL via INTRAVENOUS

## 2018-01-12 NOTE — Telephone Encounter (Signed)
[facility] CSW Progress Note  Referral received from Dr Geralyn Flash desk nurse, concerned that patient is experiencing significant weakness/fatigue, appears disheveled.  CSW called patient by phone - patient admits to having difficulty gathering energy to get to Emergency Room.  Has not showered in 3 weeks, concerned about body odor/"I havent even washed my hair."  Reports malodorous urine, "its now red, not brown."  CSW encouraged patient to get to ED as soon as possible, pt states she will "forget taking a shower, and get to the ED right away."  Patient was referred by orthopedist to [facility] for continued treatment of lymphedema, has spoken w this practice this morning.  States that she has spoken w her mental health provider, Dr Gevena Cotton, by phone this week.  Is unable to negotiate stairs to providers office.  CSW reinforced need to get to ED urgently.  Will also communicate w Dr Geralyn Flash nurse about possible need for home health PT evaluation for safety and equipment needs.  Per patient, she is using wheelchair when attending medical appointments.  Is concerned that she may fall in shower so has not bathed in several weeks.  CSW will notify [facility] CSW and request that she touch base w patient while in ED for additional support/assessment of needs.  Edwyna Shell, LCSW Clinical Social Worker Phone:  [phone]

## 2018-01-12 NOTE — Telephone Encounter (Signed)
Called pt to inform her of her upcoming CT scan appointments. Pt seemed to be struggling to get up to get supplies to write with. Informed her of her appointment date/time as well as to pick up contrast and instructions. She seems very disheveled over the phone as well as very emotional. She stated she does not know if she will feel well enough to come have the scans completed. She also informed us that she is feeling even weaker then when she saw Dr. Lindi Adie yesterday. She also stated her urine is a dark color. I informed pt that she seems to be dehydrated and needs to intake plenty of fluids. She stated she is drinking and still feels like she has cotton mouth. I have informed Dr. Lindi Adie of this. Based on patients labs, urine color, and extreme fatigue and weakness we believe it is in her best interest to go to the emergency room to be evaluated. I called patient and instructed her to go to the emergency room. She has understanding of this.  Cyndia Bent RN

## 2018-01-12 NOTE — Telephone Encounter (Signed)
Placed referral for home health physical therapy and I placed a call and left VM for Santiago Glad with [facility].  Cyndia Bent RN

## 2018-01-12 NOTE — ED Triage Notes (Signed)
Pt arrives with multiple complaints, abnormal labs seems to be the main reason, labs obtained yesterday at office, today she was called and told to come to the ED. Pt talking nonstop in a rambling state in triage.

## 2018-01-13 ENCOUNTER — Other Ambulatory Visit: Payer: Self-pay

## 2018-01-13 ENCOUNTER — Inpatient Hospital Stay (HOSPITAL_COMMUNITY): Payer: BLUE CROSS/BLUE SHIELD

## 2018-01-13 DIAGNOSIS — C7951 Secondary malignant neoplasm of bone: Secondary | ICD-10-CM | POA: Diagnosis present

## 2018-01-13 DIAGNOSIS — R402414 Glasgow coma scale score 13-15, 24 hours or more after hospital admission: Secondary | ICD-10-CM | POA: Diagnosis not present

## 2018-01-13 DIAGNOSIS — D638 Anemia in other chronic diseases classified elsewhere: Secondary | ICD-10-CM | POA: Diagnosis present

## 2018-01-13 DIAGNOSIS — Z888 Allergy status to other drugs, medicaments and biological substances status: Secondary | ICD-10-CM | POA: Diagnosis not present

## 2018-01-13 DIAGNOSIS — Z6841 Body Mass Index (BMI) 40.0 and over, adult: Secondary | ICD-10-CM | POA: Diagnosis not present

## 2018-01-13 DIAGNOSIS — C50412 Malignant neoplasm of upper-outer quadrant of left female breast: Secondary | ICD-10-CM

## 2018-01-13 DIAGNOSIS — N179 Acute kidney failure, unspecified: Secondary | ICD-10-CM | POA: Diagnosis present

## 2018-01-13 DIAGNOSIS — E039 Hypothyroidism, unspecified: Secondary | ICD-10-CM | POA: Diagnosis present

## 2018-01-13 DIAGNOSIS — Z923 Personal history of irradiation: Secondary | ICD-10-CM | POA: Diagnosis not present

## 2018-01-13 DIAGNOSIS — Z881 Allergy status to other antibiotic agents status: Secondary | ICD-10-CM | POA: Diagnosis not present

## 2018-01-13 DIAGNOSIS — Z9221 Personal history of antineoplastic chemotherapy: Secondary | ICD-10-CM | POA: Diagnosis not present

## 2018-01-13 DIAGNOSIS — Z95828 Presence of other vascular implants and grafts: Secondary | ICD-10-CM | POA: Diagnosis not present

## 2018-01-13 DIAGNOSIS — Z170 Estrogen receptor positive status [ER+]: Secondary | ICD-10-CM | POA: Diagnosis not present

## 2018-01-13 DIAGNOSIS — N390 Urinary tract infection, site not specified: Secondary | ICD-10-CM | POA: Diagnosis present

## 2018-01-13 DIAGNOSIS — I890 Lymphedema, not elsewhere classified: Secondary | ICD-10-CM | POA: Diagnosis present

## 2018-01-13 DIAGNOSIS — E860 Dehydration: Secondary | ICD-10-CM | POA: Diagnosis present

## 2018-01-13 DIAGNOSIS — Z889 Allergy status to unspecified drugs, medicaments and biological substances status: Secondary | ICD-10-CM | POA: Diagnosis not present

## 2018-01-13 DIAGNOSIS — H409 Unspecified glaucoma: Secondary | ICD-10-CM | POA: Diagnosis present

## 2018-01-13 DIAGNOSIS — T39395A Adverse effect of other nonsteroidal anti-inflammatory drugs [NSAID], initial encounter: Secondary | ICD-10-CM | POA: Diagnosis present

## 2018-01-13 DIAGNOSIS — Z79899 Other long term (current) drug therapy: Secondary | ICD-10-CM | POA: Diagnosis not present

## 2018-01-13 DIAGNOSIS — D72828 Other elevated white blood cell count: Secondary | ICD-10-CM | POA: Diagnosis present

## 2018-01-13 LAB — BASIC METABOLIC PANEL
Anion gap: 17 — ABNORMAL HIGH (ref 5–15)
BUN: 43 mg/dL — AB (ref 6–20)
CALCIUM: 9.7 mg/dL (ref 8.9–10.3)
CO2: 19 mmol/L — ABNORMAL LOW (ref 22–32)
CREATININE: 2.42 mg/dL — AB (ref 0.44–1.00)
Chloride: 100 mmol/L — ABNORMAL LOW (ref 101–111)
GFR, EST AFRICAN AMERICAN: 23 mL/min — AB (ref >60)
GFR, EST NON AFRICAN AMERICAN: 20 mL/min — AB (ref >60)
Glucose, Bld: 103 mg/dL — ABNORMAL HIGH (ref 65–99)
Potassium: 4 mmol/L (ref 3.5–5.1)
SODIUM: 136 mmol/L (ref 135–145)

## 2018-01-13 LAB — CBC WITH DIFFERENTIAL/PLATELET
BASOS PCT: 0 %
Basophils Absolute: 0 10*3/uL (ref 0.0–0.1)
EOS PCT: 0 %
Eosinophils Absolute: 0 10*3/uL (ref 0.0–0.7)
HEMATOCRIT: 38.1 % (ref 36.0–46.0)
Hemoglobin: 13.3 g/dL (ref 12.0–15.0)
Lymphocytes Relative: 12 %
Lymphs Abs: 2.2 10*3/uL (ref 0.7–4.0)
MCH: 30.6 pg (ref 26.0–34.0)
MCHC: 34.9 g/dL (ref 30.0–36.0)
MCV: 87.8 fL (ref 78.0–100.0)
MONO ABS: 1.9 10*3/uL — AB (ref 0.1–1.0)
Monocytes Relative: 10 %
NEUTROS ABS: 14.4 10*3/uL — AB (ref 1.7–7.7)
Neutrophils Relative %: 78 %
Platelets: 416 10*3/uL — ABNORMAL HIGH (ref 150–400)
RBC: 4.34 MIL/uL (ref 3.87–5.11)
RDW: 15.6 % — AB (ref 11.5–15.5)
WBC: 18.5 10*3/uL — AB (ref 4.0–10.5)

## 2018-01-13 LAB — COMPREHENSIVE METABOLIC PANEL
ALT: 38 U/L (ref 14–54)
AST: 86 U/L — AB (ref 15–41)
Albumin: 2.7 g/dL — ABNORMAL LOW (ref 3.5–5.0)
Alkaline Phosphatase: 322 U/L — ABNORMAL HIGH (ref 38–126)
Anion gap: 14 (ref 5–15)
BILIRUBIN TOTAL: 1 mg/dL (ref 0.3–1.2)
BUN: 44 mg/dL — AB (ref 6–20)
CO2: 22 mmol/L (ref 22–32)
CREATININE: 2.58 mg/dL — AB (ref 0.44–1.00)
Calcium: 10.1 mg/dL (ref 8.9–10.3)
Chloride: 99 mmol/L — ABNORMAL LOW (ref 101–111)
GFR, EST AFRICAN AMERICAN: 22 mL/min — AB (ref >60)
GFR, EST NON AFRICAN AMERICAN: 19 mL/min — AB (ref >60)
Glucose, Bld: 106 mg/dL — ABNORMAL HIGH (ref 65–99)
POTASSIUM: 4 mmol/L (ref 3.5–5.1)
Sodium: 135 mmol/L (ref 135–145)
TOTAL PROTEIN: 7.6 g/dL (ref 6.5–8.1)

## 2018-01-13 LAB — URINALYSIS, ROUTINE W REFLEX MICROSCOPIC
Glucose, UA: NEGATIVE mg/dL
KETONES UR: 5 mg/dL — AB
NITRITE: NEGATIVE
PROTEIN: 100 mg/dL — AB
Specific Gravity, Urine: 1.02 (ref 1.005–1.030)
pH: 5 (ref 5.0–8.0)

## 2018-01-13 LAB — HIV ANTIBODY (ROUTINE TESTING W REFLEX): HIV SCREEN 4TH GENERATION: NONREACTIVE

## 2018-01-13 LAB — CREATININE, URINE, RANDOM: Creatinine, Urine: 292.67 mg/dL

## 2018-01-13 LAB — CK: CK TOTAL: 50 U/L (ref 38–234)

## 2018-01-13 LAB — NA AND K (SODIUM & POTASSIUM), RAND UR
Potassium Urine: 59 mmol/L
Sodium, Ur: 14 mmol/L

## 2018-01-13 MED ORDER — BRIMONIDINE TARTRATE 0.2 % OP SOLN
1.0000 [drp] | Freq: Two times a day (BID) | OPHTHALMIC | Status: DC
  Administered 2018-01-13: 1 [drp] via OPHTHALMIC
  Filled 2018-01-13: qty 5

## 2018-01-13 MED ORDER — BRIMONIDINE TARTRATE-TIMOLOL 0.2-0.5 % OP SOLN
1.0000 [drp] | Freq: Two times a day (BID) | OPHTHALMIC | Status: DC
  Filled 2018-01-13: qty 5

## 2018-01-13 MED ORDER — ONDANSETRON HCL 4 MG/2ML IJ SOLN: 4.0000 mg | Freq: Four times a day (QID) | INTRAMUSCULAR | Status: DC | PRN

## 2018-01-13 MED ORDER — ACETAMINOPHEN 325 MG PO TABS
650.0000 mg | ORAL_TABLET | Freq: Once | ORAL | Status: AC
  Administered 2018-01-13: 650 mg via ORAL
  Filled 2018-01-13: qty 2

## 2018-01-13 MED ORDER — LEVOTHYROXINE SODIUM 50 MCG PO TABS
150.0000 ug | ORAL_TABLET | Freq: Every day | ORAL | Status: DC
  Administered 2018-01-13 – 2018-01-14 (×2): 150 ug via ORAL
  Filled 2018-01-13 (×3): qty 1

## 2018-01-13 MED ORDER — LATANOPROST 0.005 % OP SOLN
1.0000 [drp] | Freq: Every day | OPHTHALMIC | Status: DC
  Administered 2018-01-13 (×2): 1 [drp] via OPHTHALMIC
  Filled 2018-01-13: qty 2.5

## 2018-01-13 MED ORDER — SODIUM CHLORIDE 0.9 % IV SOLN
1.0000 g | Freq: Once | INTRAVENOUS | Status: AC
  Administered 2018-01-13: 1 g via INTRAVENOUS
  Filled 2018-01-13: qty 10

## 2018-01-13 MED ORDER — ONDANSETRON HCL 4 MG PO TABS: 4.0000 mg | ORAL_TABLET | Freq: Four times a day (QID) | ORAL | Status: DC | PRN

## 2018-01-13 MED ORDER — HEPARIN SODIUM (PORCINE) 5000 UNIT/ML IJ SOLN
5000.0000 [IU] | Freq: Three times a day (TID) | INTRAMUSCULAR | Status: DC
  Administered 2018-01-13 – 2018-01-14 (×4): 5000 [IU] via SUBCUTANEOUS
  Filled 2018-01-13 (×3): qty 1

## 2018-01-13 MED ORDER — DORZOLAMIDE HCL 2 % OP SOLN
1.0000 [drp] | Freq: Two times a day (BID) | OPHTHALMIC | Status: DC
  Administered 2018-01-13 – 2018-01-14 (×3): 1 [drp] via OPHTHALMIC
  Filled 2018-01-13: qty 10

## 2018-01-13 MED ORDER — ACETAMINOPHEN 325 MG PO TABS: 650.0000 mg | ORAL_TABLET | Freq: Four times a day (QID) | ORAL | Status: DC | PRN

## 2018-01-13 MED ORDER — ACETAMINOPHEN 650 MG RE SUPP
650.0000 mg | Freq: Four times a day (QID) | RECTAL | Status: DC | PRN
Start: 2018-01-13 — End: 2018-01-14

## 2018-01-13 MED ORDER — SODIUM CHLORIDE 0.9 % IV SOLN
1.0000 g | INTRAVENOUS | Status: DC
  Administered 2018-01-14: 1 g via INTRAVENOUS
  Filled 2018-01-13: qty 1

## 2018-01-13 MED ORDER — SODIUM CHLORIDE 0.9 % IV SOLN
INTRAVENOUS | Status: DC
  Administered 2018-01-13 – 2018-01-14 (×4): via INTRAVENOUS

## 2018-01-13 MED ORDER — SODIUM CHLORIDE 0.9 % IV SOLN
Freq: Once | INTRAVENOUS | Status: AC
  Administered 2018-01-13: 03:00:00 via INTRAVENOUS

## 2018-01-13 MED ORDER — TIMOLOL MALEATE 0.5 % OP SOLN
1.0000 [drp] | Freq: Two times a day (BID) | OPHTHALMIC | Status: DC
  Filled 2018-01-13: qty 5

## 2018-01-13 MED ORDER — HYDROCODONE-ACETAMINOPHEN 5-325 MG PO TABS
1.0000 | ORAL_TABLET | Freq: Four times a day (QID) | ORAL | Status: DC | PRN
  Administered 2018-01-13: 1 via ORAL
  Filled 2018-01-13: qty 1

## 2018-01-13 NOTE — ED Notes (Signed)
ED TO INPATIENT HANDOFF REPORT  Name/Age/Gender Kimberly Reed 62 y.o. female  Code Status    Code Status Orders  (From admission, onward)        Start     Ordered   01/13/18 0310  Full code  Continuous     01/13/18 0313    Code Status History    Date Active Date Inactive Code Status Order ID Comments User Context   01/30/2015 1620 01/31/2015 1249 Full Code [id number]  [name], MD Inpatient   07/19/2014 1416 07/20/2014 0332 Full Code [id number]  Luanne Bras, MD HOV      Home/SNF/Other Home  Chief Complaint Abnormal lab, past cancer pt  Level of Care/Admitting Diagnosis ED Disposition    ED Disposition Condition [place]: [facility] [[id number]]  Level of Care: Med-Surg [16]  Diagnosis: Acute kidney failure [facility]) [[id number]]  Admitting Physician: Doreatha Massed  Attending Physician: Etta Quill [phone]  Estimated length of stay: past midnight tomorrow  Certification:: I certify this patient will need inpatient services for at least 2 midnights  PT Class (Do Not Modify): Inpatient [101]  PT Acc Code (Do Not Modify): Private [1]       Medical History Past Medical History:  Diagnosis Date  . Anemia   . Anxiety   . Atypical chest pain   . Breast cancer ([place]) 05/2014   left  . Cancer ([place])    left breast dx. 7'02- chemo planned to start 07-09-14- Dr. Lindi Adie, [facility] follows  . Depression   . Family history of heart disease   . Glaucoma   . Headache syndrome 10/13/2016  . History of syncope 2008   loss of bladder control eval by Neuro  . Hot flashes   . Hyperlipemia   . Hypothyroid   . Insomnia   . Lymphedema    L arm  . Migraine without aura   . Personal history of chemotherapy   . Personal history of radiation therapy   . Wears glasses     Allergies Allergies  Allergen Reactions  . Other Nausea Only and Other (See Comments)    Antibiotics - Pt cannot identify which antibiotics she  reacts to.  Make her nausous and gives her headache.  . Effexor [Venlafaxine] Nausea And Vomiting    IV Location/Drains/Wounds Patient Lines/Drains/Airways Status   Active Line/Drains/Airways    Name:   Placement date:   Placement time:   Site:   Days:   Implanted Port 07/06/14 Right Chest   07/06/14    -    Chest   1287   Peripheral IV 01/13/18 Right Antecubital   01/13/18    0013    Antecubital   less than 1   Closed System Drain 1 Left Breast Bulb (JP) 19 Fr.   01/30/15    1338    Breast   1079   Open Drain Other (Comment)   -    -    Other (Comment)      Incision 02/24/13 Perineum   02/24/13    1010     1784   Incision (Closed) 07/06/14 Chest Right   07/06/14    0808     1287   Incision (Closed) 07/19/14 Back Left;Lower   07/19/14    1354     1274   Incision (Closed) 01/30/15 Breast Left   01/30/15    1353     1079   Incision (Closed) 01/30/15 Chest  Right   01/30/15    1353     1079   Incision (Closed) 01/30/15 Axilla Left   01/30/15    1353     1079          Labs/Imaging Results for orders placed or performed during the hospital encounter of 01/12/18 (from the past 48 hour(s))  CBC with Differential/Platelet     Status: Abnormal   Collection Time: 01/12/18 11:47 PM  Result Value Ref Range   WBC 18.5 (H) 4.0 - 10.5 K/uL   RBC 4.34 3.87 - 5.11 MIL/uL   Hemoglobin 13.3 12.0 - 15.0 g/dL   HCT 38.1 36.0 - 46.0 %   MCV 87.8 78.0 - 100.0 fL   MCH 30.6 26.0 - 34.0 pg   MCHC 34.9 30.0 - 36.0 g/dL   RDW 15.6 (H) 11.5 - 15.5 %   Platelets 416 (H) 150 - 400 K/uL   Neutrophils Relative % 78 %   Lymphocytes Relative 12 %   Monocytes Relative 10 %   Eosinophils Relative 0 %   Basophils Relative 0 %   Neutro Abs 14.4 (H) 1.7 - 7.7 K/uL   Lymphs Abs 2.2 0.7 - 4.0 K/uL   Monocytes Absolute 1.9 (H) 0.1 - 1.0 K/uL   Eosinophils Absolute 0.0 0.0 - 0.7 K/uL   Basophils Absolute 0.0 0.0 - 0.1 K/uL   Smear Review MORPHOLOGY UNREMARKABLE     Comment: Performed at [facility], [place] [address] [place], [place] [address]  Comprehensive metabolic panel     Status: Abnormal   Collection Time: 01/12/18 11:47 PM  Result Value Ref Range   Sodium 135 135 - 145 mmol/L   Potassium 4.0 3.5 - 5.1 mmol/L   Chloride 99 (L) 101 - 111 mmol/L   CO2 22 22 - 32 mmol/L   Glucose, Bld 106 (H) 65 - 99 mg/dL   BUN 44 (H) 6 - 20 mg/dL   Creatinine, Ser 2.58 (H) 0.44 - 1.00 mg/dL   Calcium 10.1 8.9 - 10.3 mg/dL   Total Protein 7.6 6.5 - 8.1 g/dL   Albumin 2.7 (L) 3.5 - 5.0 g/dL   AST 86 (H) 15 - 41 U/L   ALT 38 14 - 54 U/L   Alkaline Phosphatase 322 (H) 38 - 126 U/L   Total Bilirubin 1.0 0.3 - 1.2 mg/dL   GFR calc non Af Amer 19 (L) >60 mL/min   GFR calc Af Amer 22 (L) >60 mL/min    Comment: (NOTE) The eGFR has been calculated using the CKD EPI equation. This calculation has not been validated in all clinical situations. eGFR's persistently <60 mL/min signify possible Chronic Kidney Disease.    Anion gap 14 5 - 15    Comment: Performed at [facility], [place] [address] [place], [place] [address]  CBG monitoring, ED     Status: None   Collection Time: 01/12/18 11:54 PM  Result Value Ref Range   Glucose-Capillary 90 65 - 99 mg/dL   Comment 1 Notify RN   Urinalysis, Routine w reflex microscopic     Status: Abnormal   Collection Time: 01/13/18 12:04 AM  Result Value Ref Range   Color, Urine AMBER (A) YELLOW    Comment: BIOCHEMICALS MAY BE AFFECTED BY COLOR   APPearance CLOUDY (A) CLEAR   Specific Gravity, Urine 1.020 1.005 - 1.030   pH 5.0 5.0 - 8.0   Glucose, UA NEGATIVE NEGATIVE mg/dL   Hgb urine dipstick SMALL (A)  NEGATIVE   Bilirubin Urine SMALL (A) NEGATIVE   Ketones, ur 5 (A) NEGATIVE mg/dL   Protein, ur 100 (A) NEGATIVE mg/dL   Nitrite NEGATIVE NEGATIVE   Leukocytes, UA SMALL (A) NEGATIVE   RBC / HPF 6-30 0 - 5 RBC/hpf   WBC, UA TOO NUMEROUS TO COUNT 0 - 5 WBC/hpf   Bacteria, UA RARE (A) NONE SEEN   Squamous Epithelial / LPF 6-30  (A) NONE SEEN   Mucus PRESENT    Hyaline Casts, UA PRESENT    Amorphous Crystal PRESENT     Comment: Performed at [facility], [place] [address], [place], [place] [address]   No results found.  Pending Labs Unresulted Labs (From admission, onward)   Start     Ordered   01/13/18 3235  Basic metabolic panel  Tomorrow morning,   R     01/13/18 0307   01/13/18 0314  Creatinine, urine, random  Once,   R     01/13/18 0313   01/13/18 0313  Na and K (sodium & potassium), rand urine  Once,   R     01/13/18 0313   01/13/18 0310  CK  Once,   R     01/13/18 0313   01/13/18 0309  HIV antibody (Routine Testing)  Once,   R     01/13/18 0313   01/13/18 0240  Urine Culture  Once,   R     01/13/18 0240      Vitals/Pain Today's Vitals   01/12/18 2209 01/12/18 2217 01/13/18 0045 01/13/18 0305  BP: 117/73  (!) 146/91 135/82  Pulse: 88  73 88  Resp: _0 Temp: 97.6 F (36.4 C)     TempSrc: Oral     SpO2: 99%  100% 100%  Weight:      Height:      PainSc:  7       Isolation Precautions No active isolations  Medications Medications  cefTRIAXone (ROCEPHIN) 1 g in sodium chloride 0.9 % 100 mL IVPB (has no administration in time range)  0.9 %  sodium chloride infusion (has no administration in time range)  HYDROcodone-acetaminophen (NORCO/VICODIN) 5-325 MG per tablet 1 tablet (has no administration in time range)  levothyroxine (SYNTHROID, LEVOTHROID) tablet 150 mcg (has no administration in time range)  dorzolamide (TRUSOPT) 2 % ophthalmic solution 1 drop (has no administration in time range)  brimonidine-timolol (COMBIGAN) 0.2-0.5 % ophthalmic solution 1 drop (has no administration in time range)  latanoprost (XALATAN) 0.005 % ophthalmic solution 1 drop (has no administration in time range)  acetaminophen (TYLENOL) tablet 650 mg (has no administration in time range)    Or  acetaminophen (TYLENOL) suppository 650 mg (has no administration in time range)   ondansetron (ZOFRAN) tablet 4 mg (has no administration in time range)    Or  ondansetron (ZOFRAN) injection 4 mg (has no administration in time range)  heparin injection 5,000 Units (has no administration in time range)  sodium chloride 0.9 % bolus 1,000 mL (0 mLs Intravenous Stopped 01/13/18 0257)  cefTRIAXone (ROCEPHIN) 1 g in sodium chloride 0.9 % 100 mL IVPB (0 g Intravenous Stopped 01/13/18 0111)  acetaminophen (TYLENOL) tablet 650 mg (650 mg Oral Given 01/13/18 0303)  0.9 %  sodium chloride infusion ( Intravenous New Bag/Given 01/13/18 0305)    Mobility walks with person assist

## 2018-01-13 NOTE — Progress Notes (Signed)
Per [facility] rep, pt was referred to them by Dr. Geralyn Flash office for [place]. If pt continues to need PT, will need resumption order for HHPT. Marney Doctor RN,BSN,NCM [phone]

## 2018-01-13 NOTE — Progress Notes (Addendum)
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  Patient was seen by her oncologist  and was found to have hyponatremia and leukocytosis and was sent to the emergency department for further evaluation.  Patient found to have acute kidney injury on presentation.  UA was positive for UTI.  Patient was admitted for further management.  Assessment & Plan:   Principal Problem:   Acute kidney failure (HCC) Active Problems:   Breast cancer of upper-outer quadrant of left female breast ([place])   Acute lower UTI   Lymphedema of arm  Acute kidney injury:Likely secondary to urinary tract infection, dehydration,NSAIDs.  Slight improvement in kidney function today.  Continue IV fluids.  Renal ultrasound found to be normal.  Hold nephrotoxic medications. Patient was taking NSAIDs secondary to pain from her lymphedema of the left arm.  Urinary tract infection: UA suggestive of UTI .We will follow-up urine culture.  Patient denies any urinary symptoms.  Currently on Rocephin.  Lymphedema of the left arm: Continue supportive care and pain management.  Avoid NSAIDs.  Metastatic left breast cancer: Follows with her oncologist Dr. Keene Breath.  Status post treatment with neoadjuvant chemotherapy with Adriamycin and Cytoxan followed by Taxol.    Her scans done as an outpatient has not shown any progression of the disease.  Hypothyroidism: Continue Synthyroid  DVT prophylaxis: hep [place] Code Status: Full Family Communication: None present at the bedside Disposition Plan: Home after resolution of acute kidney injury, UTI   Consultants: None  Procedures: None  Antimicrobials: None  Subjective: Ceftriaxone since  01/13/18   Objective: Vitals:   01/13/18 0305 01/13/18 0355 01/13/18 0902 01/13/18 1258  BP: 135/82 (!) 141/77 120/71 110/71  Pulse: 88 91 95 100  Resp: _0 Temp:  98.3 F (36.8 C)  98 F (36.7 C)  TempSrc:  Oral  Oral  SpO2: 100% 100% 100% 100%  Weight:      Height:        Intake/Output Summary (Last 24 hours) at 01/13/2018 1437 Last data filed at 01/13/2018 1219 Gross per 24 hour  Intake 1340 ml  Output 200 ml  Net 1140 ml   Filed Weights   01/12/18 1528  Weight: 127 kg (280 lb)    Examination:  General exam: Appears calm and comfortable ,Not in distress, morbidly obese HEENT:PERRL,Oral mucosa moist, Ear/Nose normal on gross exam Respiratory system: Bilateral equal air entry, normal vesicular breath sounds, no wheezes or crackles  Cardiovascular system: S1 & S2 heard, RRR. No JVD, murmurs, rubs, gallops or clicks. No pedal edema. Gastrointestinal system: Abdomen is nondistended, soft and nontender. No organomegaly or masses felt. Normal bowel sounds heard. Central nervous system: Alert and oriented. No focal neurological deficits. Extremities: Lymphedema of the left upper extremity, no clubbing ,no cyanosis, distal peripheral pulses palpable. Skin: No rashes, lesions or ulcers,no icterus ,no pallor MSK: Normal muscle bulk,tone ,power Psychiatry: Judgement and insight appear normal. Mood & affect appropriate.     Data Reviewed: I have personally reviewed following labs and imaging studies  CBC: Recent Labs  Lab 01/11/18 1202 01/12/18 2347  WBC 16.3* 18.5*  NEUTROABS 12.8* 14.4*  HGB  --  13.3  HCT 37.9 38.1  MCV 88.6 87.8  PLT  409* 614*   Basic Metabolic Panel: Recent Labs  Lab 01/11/18 1202 01/12/18 2347 01/13/18 0428  NA 135* 135 136  K 3.7 4.0 4.0  CL 101 99* 100*  CO2 22 22 19*  GLUCOSE 113 106* 103*  BUN 21 44* 43*  CREATININE 1.16* 2.58* 2.42*  CALCIUM 10.5* 10.1 9.7   GFR: Estimated Creatinine Clearance: 31.9 mL/min (A) (by C-G  formula based on SCr of 2.42 mg/dL (H)). Liver Function Tests: Recent Labs  Lab 01/11/18 1202 01/12/18 2347  AST 90* 86*  ALT 41 38  ALKPHOS 315* 322*  BILITOT 1.4* 1.0  PROT 7.7 7.6  ALBUMIN 2.5* 2.7*   No results for input(s): LIPASE, AMYLASE in the last 168 hours. No results for input(s): AMMONIA in the last 168 hours. Coagulation Profile: No results for input(s): INR, PROTIME in the last 168 hours. Cardiac Enzymes: Recent Labs  Lab 01/13/18 0428  CKTOTAL 50   BNP (last 3 results) No results for input(s): PROBNP in the last 8760 hours. HbA1C: No results for input(s): HGBA1C in the last 72 hours. CBG: Recent Labs  Lab 01/12/18 2354  GLUCAP 90   Lipid Profile: No results for input(s): CHOL, HDL, LDLCALC, TRIG, CHOLHDL, LDLDIRECT in the last 72 hours. Thyroid Function Tests: No results for input(s): TSH, T4TOTAL, FREET4, T3FREE, THYROIDAB in the last 72 hours. Anemia Panel: No results for input(s): VITAMINB12, FOLATE, FERRITIN, TIBC, IRON, RETICCTPCT in the last 72 hours. Sepsis Labs: No results for input(s): PROCALCITON, LATICACIDVEN in the last 168 hours.  No results found for this or any previous visit (from the past 240 hour(s)).       Radiology Studies: US Renal  Result Date: 01/13/2018 CLINICAL DATA:  Acute kidney injury. EXAM: RENAL / URINARY TRACT ULTRASOUND COMPLETE COMPARISON:  Abdomen and pelvis CT dated 01/10/2016. FINDINGS: Right Kidney: Length: 12.4 cm. Echogenicity within normal limits. No mass or hydronephrosis visualized. Left Kidney: Length: 12.0 cm. Echogenicity within normal limits. No mass or hydronephrosis visualized. Bladder: Appears normal for degree of bladder distention. IMPRESSION: Normal examination. Electronically Signed   By: Claudie Revering M.D.   On: 01/13/2018 10:16        Scheduled Meds: . brimonidine  1 drop Left Eye Q12H   And  . timolol  1 drop Right Eye Q12H  . dorzolamide  1 drop Right Eye BID  . heparin  5,000 Units  Subcutaneous Q8H  . latanoprost  1 drop Right Eye QHS  . levothyroxine  150 mcg Oral QAC breakfast   Continuous Infusions: . sodium chloride 125 mL/hr at 01/13/18 1107  . [START ON 01/14/2018] cefTRIAXone (ROCEPHIN)  IV       LOS: 0 days    Time spent: 25 mins.More than 50% of that time was spent in counseling and/or coordination of care.      Shelly Coss, MD [facility] Pager [phone]  If 7PM-7AM, please contact night-coverage [email or web address] Password [facility] 01/13/2018, 2:37 PM

## 2018-01-13 NOTE — ED Provider Notes (Signed)
[facility] DEPT Provider Note   CSN: [id number] Arrival date & time: 01/12/18  1516     History   Chief Complaint Chief Complaint  Patient presents with  . Multiple complaints  . lymphedema    Left arm  . Abnormal Labs    HPI Kimberly Reed is a 62 y.o. female.  Patient with PMH of metastatic breast cancer presents to the ED with a chief complaint of generalized weakness and fatigue.  She states that she has not had energy or strength to do much of anything over the past few weeks.  She states that her doctor ran blood work yesterday and told her to come to the ED for low sodium and an elevated WBC.  She denies any fevers or chills.  She states that she has been urinating less than normal.  She states that her urine has been dark.  She also reports ongoing lymphedema in her left arm, which is not new.  She is seeing PT for this.   The history is provided by the patient. No language interpreter was used.    Past Medical History:  Diagnosis Date  . Anemia   . Anxiety   . Atypical chest pain   . Breast cancer ([place]) 05/2014   left  . Cancer ([place])    left breast dx. 3'71- chemo planned to start 07-09-14- Dr. Lindi Adie[facility]  . Depression   . Family history of heart disease   . Glaucoma   . Headache syndrome 10/13/2016  . History of syncope 2008   loss of bladder control eval by Neuro  . Hot flashes   . Hyperlipemia   . Hypothyroid   . Insomnia   . Lymphedema    L arm  . Migraine without aura   . Personal history of chemotherapy   . Personal history of radiation therapy   . Wears glasses     Patient Active Problem List   Diagnosis Date Noted  . Mouth dryness 06/03/2017  . Sleep-related headache 06/03/2017  . Heart palpitations 06/03/2017  . Headache syndrome 10/13/2016  . Glaucoma 01/23/2015  . Visit for preventive health examination 01/23/2015  . Chemotherapy induced neutropenia ([place]) 08/02/2014  . Anxiety 07/04/2014   . Anemia due to antineoplastic chemotherapy 06/22/2014  . Depression 06/06/2014  . Breast cancer of upper-outer quadrant of left female breast ([place]) 05/28/2014  . Snoring 04/26/2014  . Persistent disorder of initiating or maintaining sleep 04/26/2014  . Atypical chest pain 03/22/2014  . Medication side effect 02/19/2014  . Adjustment disorder with mixed anxiety and depressed mood 02/19/2014  . Pain of left heel 11/14/2012  . Memory difficulties 11/14/2012  . Stress at work 11/14/2012  . Plantar fasciitis of left foot 11/14/2012  . Neoplasm of uncertain behavior of skin 07/10/2011  . Infection, skin 07/10/2011  . Family hx of aortic aneurysm 03/16/2011  . HYPERLIPIDEMIA 07/24/2008  . Super obese 07/24/2008  . DYSTHYMIA, CHRONIC 07/24/2008  . PERIMENOPAUSAL SYNDROME 07/24/2008  . FATIGUE 07/24/2008  . HYPOTHYROIDISM 11/29/2007  . GLUCOMA 11/29/2007  . HEADACHE 11/29/2007    Past Surgical History:  Procedure Laterality Date  . BREAST BIOPSY Left 06/07/2014   malignant  . BREAST BIOPSY Left 05/24/2014   malignant  . BREAST LUMPECTOMY Left 01/30/2015   malignant  . BREAST LUMPECTOMY WITH NEEDLE LOCALIZATION AND AXILLARY LYMPH NODE DISSECTION Left 01/30/2015   Procedure: BREAST LUMPECTOMY WITH NEEDLE LOCALIZATION LEFT AXILLARY DISSECTION REMOVAL OF PORT;  Surgeon: [name], MD;  Location: [facility];  Service: General;  Laterality: Left;  . CATARACT EXTRACTION Left   . DILATION AND CURETTAGE OF UTERUS    . ESOPHAGOGASTRODUODENOSCOPY  06/01/14  . EXAMINATION UNDER ANESTHESIA  02/24/2013   Procedure: EXAM UNDER ANESTHESIA;  Surgeon: Azalia Bilis, MD;  Location: [facility];  Service: Gynecology;;  with removal of prolapsing cervical mass; pap smear and endometrial biopsy  . GLAUCOMA SURGERY Left    x1   . PORTACATH PLACEMENT Right 07/06/2014   Procedure: PORT A CATH  PLACEMENT;  Surgeon: [name], MD;  Location: [facility];  Service: General;  Laterality:  Right;  . TONSILLECTOMY     child  . uterine polyp removed     per hysteroscopy about 1 1/2 yrs ago.    OB History    No data available       Home Medications    Prior to Admission medications   Medication Sig Start Date End Date Taking? Authorizing Provider  brimonidine-timolol (COMBIGAN) 0.2-0.5 % ophthalmic solution Place 1 drop into the right eye every 12 (twelve) hours.    Yes [provider]  dorzolamide (TRUSOPT) 2 % ophthalmic solution Place 1 drop into the right eye 2 (two) times daily.    Yes [provider]  ibuprofen (ADVIL,MOTRIN) 200 MG tablet Take 200 mg by mouth every 6 (six) hours as needed for moderate pain.    Yes [provider]  levothyroxine (SYNTHROID, LEVOTHROID) 150 MCG tablet TAKE 1 TABLET BY MOUTH EVERY DAY ON EMPTY STOMACH IN THE MORNING 12/29/17  Yes [provider]  Omega-3 Fatty Acids (FISH OIL) 1000 MG CAPS Take 1,000 mg by mouth daily.   Yes [provider]  Travoprost, BAK Free, (TRAVATAN) 0.004 % SOLN ophthalmic solution Place 1 drop into the right eye at bedtime.    Yes [provider]    Family History Family History  Problem Relation Age of Onset  . Aneurysm Mother   . Kidney disease Mother   . Hypertension Father   . Dementia Father   . Kidney disease Brother   . Heart disease Unknown        "all of moms side"  . Depression Unknown   . Heart failure Cousin     Social History Social History   Tobacco Use  . Smoking status: Never Smoker  . Smokeless tobacco: Never Used  Substance Use Topics  . Alcohol use: No  . Drug use: No    Comment: in 20's but not now     Allergies   Other and Effexor [venlafaxine]   Review of Systems Review of Systems  All other systems reviewed and are negative.    Physical Exam Updated Vital Signs BP 117/73 (BP Location: Left Arm)   Pulse 88   Temp 97.6 F (36.4 C) (Oral)   Resp 20   Ht 5\' 5"  (1.651 m)   Wt 127 kg (280 lb)   LMP  02/15/2013   SpO2 99%   BMI 46.59 kg/m   Physical Exam  Constitutional: She is oriented to person, place, and time. She appears well-developed and well-nourished.  HENT:  Head: Normocephalic and atraumatic.  Eyes: Conjunctivae and EOM are normal. Pupils are equal, round, and reactive to light.  Neck: Normal range of motion. Neck supple.  Cardiovascular: Normal rate and regular rhythm. Exam reveals no gallop and no friction rub.  No murmur heard. Pulmonary/Chest: Effort normal and breath sounds normal. No respiratory distress. She has no wheezes.  She has no rales. She exhibits no tenderness.  Abdominal: Soft. Bowel sounds are normal. She exhibits no distension and no mass. There is no tenderness. There is no rebound and no guarding.  Musculoskeletal: Normal range of motion. She exhibits edema. She exhibits no tenderness.  Lymphedema of left upper extremity  Neurological: She is alert and oriented to person, place, and time.  Skin: Skin is warm and dry.  Psychiatric: She has a normal mood and affect. Her behavior is normal. Judgment and thought content normal.  Nursing note and vitals reviewed.    ED Treatments / Results  Labs (all labs ordered are listed, but only abnormal results are displayed) Labs Reviewed  CBC WITH DIFFERENTIAL/PLATELET - Abnormal; Notable for the following components:      Result Value   WBC 18.5 (*)    RDW 15.6 (*)    Platelets 416 (*)    Neutro Abs 14.4 (*)    Monocytes Absolute 1.9 (*)    All other components within normal limits  COMPREHENSIVE METABOLIC PANEL - Abnormal; Notable for the following components:   Chloride 99 (*)    Glucose, Bld 106 (*)    BUN 44 (*)    Creatinine, Ser 2.58 (*)    Albumin 2.7 (*)    AST 86 (*)    Alkaline Phosphatase 322 (*)    GFR calc non Af Amer 19 (*)    GFR calc Af Amer 22 (*)    All other components within normal limits  URINALYSIS, ROUTINE W REFLEX MICROSCOPIC - Abnormal; Notable for the following components:    Color, Urine AMBER (*)    APPearance CLOUDY (*)    Hgb urine dipstick SMALL (*)    Bilirubin Urine SMALL (*)    Ketones, ur 5 (*)    Protein, ur 100 (*)    Leukocytes, UA SMALL (*)    Bacteria, UA RARE (*)    Squamous Epithelial / LPF 6-30 (*)    All other components within normal limits  URINE CULTURE  CBG MONITORING, ED    EKG  EKG Interpretation None       Radiology No results found.  Procedures Procedures (including critical care time)  Medications Ordered in ED Medications  sodium chloride 0.9 % bolus 1,000 mL (not administered)     Initial Impression / Assessment and Plan / ED Course  I have reviewed the triage vital signs and the nursing notes.  Pertinent labs & imaging results that were available during my care of the patient were reviewed by me and considered in my medical decision making (see chart for details).     Patient with weakness and fatigue and dark urine x 1 week.  Cr is elevated at 2.58, up significantly from 2 days ago.  Probable UTI.  Will treat with rocephin.  Will give fluids.  Admit to medicine for AKI and UTI.    Appreciate Dr. Alcario Drought for admitting the patient.  Final Clinical Impressions(s) / ED Diagnoses   Final diagnoses:  Acute renal failure, unspecified acute renal failure type [facility])  Urinary tract infection without hematuria, site unspecified    ED Discharge Orders    None       Montine Circle, PA-C 36/62/94 [id number]    Delora Fuel, MD 65/03/54 [phone]

## 2018-01-13 NOTE — H&P (Signed)
History and Physical    Kimberly Reed ITG:[id number] DOB: 1956/04/12 DOA: 01/12/2018  PCP: [name], MD  Patient coming from: Home  I have personally briefly reviewed patient's old medical records in [place]  Chief Complaint: Fatigue  HPI: Kimberly Reed is a 62 y.o. female with medical history significant of BRCA, mets to bone but fairly stable at this point (Dr. Lindi Adie note from 2 days ago).  Patient presents to the ED with c/o generalized weakness and fatigue.  She states that she has not had energy or strength to do much of anything over the past few weeks.  She states that her doctor ran blood work yesterday and told her to come to the ED for low sodium and an elevated WBC   ED Course: Now also has AKF with creat 2.5 up from 1.1 yesterday, creat 0.8 baseline.  UA shows UTI.  WBC 18k.  Rocephin, 1L NS bolus.   Review of Systems: As per HPI otherwise 10 point review of systems negative.   Past Medical History:  Diagnosis Date  . Anemia   . Anxiety   . Atypical chest pain   . Breast cancer ([place]) 05/2014   left  . Cancer ([place])    left breast dx. 8'30- chemo planned to start 07-09-14- Dr. Lindi Adie[facility] follows  . Depression   . Family history of heart disease   . Glaucoma   . Headache syndrome 10/13/2016  . History of syncope 2008   loss of bladder control eval by Neuro  . Hot flashes   . Hyperlipemia   . Hypothyroid   . Insomnia   . Lymphedema    L arm  . Migraine without aura   . Personal history of chemotherapy   . Personal history of radiation therapy   . Wears glasses     Past Surgical History:  Procedure Laterality Date  . BREAST BIOPSY Left 06/07/2014   malignant  . BREAST BIOPSY Left 05/24/2014   malignant  . BREAST LUMPECTOMY Left 01/30/2015   malignant  . BREAST LUMPECTOMY WITH NEEDLE LOCALIZATION AND AXILLARY LYMPH NODE DISSECTION Left 01/30/2015   Procedure: BREAST LUMPECTOMY WITH NEEDLE LOCALIZATION LEFT AXILLARY DISSECTION  REMOVAL OF PORT;  Surgeon: [name], MD;  Location: [facility];  Service: General;  Laterality: Left;  . CATARACT EXTRACTION Left   . DILATION AND CURETTAGE OF UTERUS    . ESOPHAGOGASTRODUODENOSCOPY  06/01/14  . EXAMINATION UNDER ANESTHESIA  02/24/2013   Procedure: EXAM UNDER ANESTHESIA;  Surgeon: Azalia Bilis, MD;  Location: [facility];  Service: Gynecology;;  with removal of prolapsing cervical mass; pap smear and endometrial biopsy  . GLAUCOMA SURGERY Left    x1   . PORTACATH PLACEMENT Right 07/06/2014   Procedure: PORT A CATH  PLACEMENT;  Surgeon: [name], MD;  Location[facility];  Service: General;  Laterality: Right;  . TONSILLECTOMY     child  . uterine polyp removed     per hysteroscopy about 1 1/2 yrs ago.     reports that she has never smoked. She has never used smokeless tobacco. She reports that she does not drink alcohol or use drugs.  Allergies  Allergen Reactions  . Other Nausea Only and Other (See Comments)    Antibiotics - Pt cannot identify which antibiotics she reacts to.  Make her nausous and gives her headache.  . Effexor [Venlafaxine] Nausea And Vomiting    Family History  Problem Relation Age of Onset  .  Aneurysm Mother   . Kidney disease Mother   . Hypertension Father   . Dementia Father   . Kidney disease Brother   . Heart disease Unknown        "all of moms side"  . Depression Unknown   . Heart failure Cousin      Prior to Admission medications   Medication Sig Start Date End Date Taking? Authorizing Provider  brimonidine-timolol (COMBIGAN) 0.2-0.5 % ophthalmic solution Place 1 drop into the right eye every 12 (twelve) hours.    Yes [provider]  dorzolamide (TRUSOPT) 2 % ophthalmic solution Place 1 drop into the right eye 2 (two) times daily.    Yes [provider]  ibuprofen (ADVIL,MOTRIN) 200 MG tablet Take 200 mg by mouth every 6 (six) hours as needed for moderate pain.    Yes [provider]  levothyroxine (SYNTHROID, LEVOTHROID) 150 MCG tablet TAKE 1 TABLET BY MOUTH EVERY DAY ON EMPTY STOMACH IN THE MORNING 12/29/17  Yes [provider]  Omega-3 Fatty Acids (FISH OIL) 1000 MG CAPS Take 1,000 mg by mouth daily.   Yes [provider]  Travoprost, BAK Free, (TRAVATAN) 0.004 % SOLN ophthalmic solution Place 1 drop into the right eye at bedtime.    Yes [provider]    Physical Exam: Vitals:   01/12/18 2209 01/13/18 0045 01/13/18 0305 01/13/18 0355  BP: 117/73 (!) 146/91 135/82 (!) 141/77  Pulse: 88 73 88 91  Resp: _0 Temp: 97.6 F (36.4 C)   98.3 F (36.8 C)  TempSrc: Oral   Oral  SpO2: 99% 100% 100% 100%  Weight:      Height:        Constitutional: NAD, calm, comfortable Eyes: PERRL, lids and conjunctivae normal ENMT: Mucous membranes are moist. Posterior pharynx clear of any exudate or lesions.Normal dentition.  Neck: normal, supple, no masses, no thyromegaly Respiratory: clear to auscultation bilaterally, no wheezing, no crackles. Normal respiratory effort. No accessory muscle use.  Cardiovascular: Regular rate and rhythm, no murmurs / rubs / gallops. No extremity edema. 2+ pedal pulses. No carotid bruits.  Abdomen: no tenderness, no masses palpated. No hepatosplenomegaly. Bowel sounds positive.  Musculoskeletal: no clubbing / cyanosis. No joint deformity upper and lower extremities. Good ROM, no contractures. Normal muscle tone.  Skin: no rashes, lesions, ulcers. No induration, Profound lymphedema of L arm Neurologic: CN 2-12 grossly intact. Sensation intact, DTR normal. Strength 5/5 in all 4.  Psychiatric: Normal judgment and insight. Alert and oriented x 3. Normal mood.    Labs on Admission: I have personally reviewed following labs and imaging studies  CBC: Recent Labs  Lab 01/11/18 1202 01/12/18 2347  WBC 16.3* 18.5*  NEUTROABS 12.8* 14.4*  HGB  --  13.3  HCT 37.9 38.1  MCV 88.6 87.8  PLT 409* 416*    Basic Metabolic Panel: Recent Labs  Lab 01/11/18 1202 01/12/18 2347  NA 135* 135  K 3.7 4.0  CL 101 99*  CO2 22 22  GLUCOSE 113 106*  BUN 21 44*  CREATININE 1.16* 2.58*  CALCIUM 10.5* 10.1   GFR: Estimated Creatinine Clearance: 29.9 mL/min (A) (by C-G formula based on SCr of 2.58 mg/dL (H)). Liver Function Tests: Recent Labs  Lab 01/11/18 1202 01/12/18 2347  AST 90* 86*  ALT 41 38  ALKPHOS 315* 322*  BILITOT 1.4* 1.0  PROT 7.7 7.6  ALBUMIN 2.5* 2.7*   No results for input(s): LIPASE, AMYLASE in the last  168 hours. No results for input(s): AMMONIA in the last 168 hours. Coagulation Profile: No results for input(s): INR, PROTIME in the last 168 hours. Cardiac Enzymes: No results for input(s): CKTOTAL, CKMB, CKMBINDEX, TROPONINI in the last 168 hours. BNP (last 3 results) No results for input(s): PROBNP in the last 8760 hours. HbA1C: No results for input(s): HGBA1C in the last 72 hours. CBG: Recent Labs  Lab 01/12/18 2354  GLUCAP 90   Lipid Profile: No results for input(s): CHOL, HDL, LDLCALC, TRIG, CHOLHDL, LDLDIRECT in the last 72 hours. Thyroid Function Tests: No results for input(s): TSH, T4TOTAL, FREET4, T3FREE, THYROIDAB in the last 72 hours. Anemia Panel: No results for input(s): VITAMINB12, FOLATE, FERRITIN, TIBC, IRON, RETICCTPCT in the last 72 hours. Urine analysis:    Component Value Date/Time   COLORURINE AMBER (A) 01/13/2018 0004   APPEARANCEUR CLOUDY (A) 01/13/2018 0004   LABSPEC 1.020 01/13/2018 0004   LABSPEC 1.015 10/05/2014 1324   PHURINE 5.0 01/13/2018 0004   GLUCOSEU NEGATIVE 01/13/2018 0004   GLUCOSEU Negative 10/05/2014 1324   HGBUR SMALL (A) 01/13/2018 0004   BILIRUBINUR SMALL (A) 01/13/2018 0004   BILIRUBINUR Negative 10/05/2014 1324   KETONESUR 5 (A) 01/13/2018 0004   PROTEINUR 100 (A) 01/13/2018 0004   UROBILINOGEN 0.2 10/05/2014 1324   NITRITE NEGATIVE 01/13/2018 0004   LEUKOCYTESUR SMALL (A) 01/13/2018 0004    LEUKOCYTESUR Trace 10/05/2014 1324    Radiological Exams on Admission: No results found.  EKG: Independently reviewed.  Assessment/Plan Principal Problem:   Acute kidney failure (HCC) Active Problems:   Breast cancer of upper-outer quadrant of left female breast ([place])   Acute lower UTI   Lymphedema of arm    1. AKF - Possible due to UTI 1. IVF for hydration 2. Renal US 3. FeNa 4. Strict intake and output 5. Hold all nephrotoxic NSAIDS which she has been taking extra of recently due to pain from lymphedema of arm. 6. If no rapid improvement, nephrology consult. 2. UTI - 1. Rocephin 2. Cultures pending 3. Lymphedema of arm - 1. Try tylenol 2. If fails then try norco 3. Avoid nsaids due to nephrotoxicity 4. BRCA - 1. Dr. Lindi Adie sent patient over to ED from office 2. Currently on observation therapy only  DVT prophylaxis: Heparin [place] Code Status: Full Family Communication: No family in room Disposition Plan: Home after admit Consults called: None Admission status: Admit to inpatient - IP status for AKF   Etta Quill DO [facility] Pager [phone]  If 7AM-7PM, please contact day team taking care of patient [email or web address] Password [facility]  01/13/2018, 4:39 AM

## 2018-01-14 ENCOUNTER — Other Ambulatory Visit: Payer: Self-pay

## 2018-01-14 DIAGNOSIS — C50412 Malignant neoplasm of upper-outer quadrant of left female breast: Secondary | ICD-10-CM

## 2018-01-14 LAB — URINE CULTURE

## 2018-01-14 LAB — BASIC METABOLIC PANEL
Anion gap: 13 (ref 5–15)
BUN: 44 mg/dL — ABNORMAL HIGH (ref 6–20)
CALCIUM: 9.8 mg/dL (ref 8.9–10.3)
CO2: 18 mmol/L — AB (ref 22–32)
CREATININE: 1.69 mg/dL — AB (ref 0.44–1.00)
Chloride: 103 mmol/L (ref 101–111)
GFR calc Af Amer: 36 mL/min — ABNORMAL LOW (ref >60)
GFR calc non Af Amer: 31 mL/min — ABNORMAL LOW (ref >60)
GLUCOSE: 102 mg/dL — AB (ref 65–99)
Potassium: 3.4 mmol/L — ABNORMAL LOW (ref 3.5–5.1)
Sodium: 134 mmol/L — ABNORMAL LOW (ref 135–145)

## 2018-01-14 LAB — CBC WITH DIFFERENTIAL/PLATELET
BASOS ABS: 0 10*3/uL (ref 0.0–0.1)
Basophils Relative: 0 %
Eosinophils Absolute: 0.2 10*3/uL (ref 0.0–0.7)
Eosinophils Relative: 1 %
HEMATOCRIT: 34.9 % — AB (ref 36.0–46.0)
Hemoglobin: 11.7 g/dL — ABNORMAL LOW (ref 12.0–15.0)
LYMPHS ABS: 2.2 10*3/uL (ref 0.7–4.0)
Lymphocytes Relative: 13 %
MCH: 29.3 pg (ref 26.0–34.0)
MCHC: 33.5 g/dL (ref 30.0–36.0)
MCV: 87.5 fL (ref 78.0–100.0)
MONOS PCT: 14 %
Monocytes Absolute: 2.3 10*3/uL — ABNORMAL HIGH (ref 0.1–1.0)
NEUTROS ABS: 12 10*3/uL — AB (ref 1.7–7.7)
Neutrophils Relative %: 72 %
Platelets: 372 10*3/uL (ref 150–400)
RBC: 3.99 MIL/uL (ref 3.87–5.11)
RDW: 15.8 % — AB (ref 11.5–15.5)
WBC: 16.7 10*3/uL — ABNORMAL HIGH (ref 4.0–10.5)

## 2018-01-14 MED ORDER — CEFDINIR 300 MG PO CAPS: 300.0000 mg | ORAL_CAPSULE | Freq: Two times a day (BID) | ORAL | 0 refills | Status: AC

## 2018-01-14 MED ORDER — POTASSIUM CHLORIDE CRYS ER 20 MEQ PO TBCR
40.0000 meq | EXTENDED_RELEASE_TABLET | Freq: Once | ORAL | Status: AC
  Administered 2018-01-14: 40 meq via ORAL
  Filled 2018-01-14: qty 2

## 2018-01-14 MED ORDER — ACETAMINOPHEN 325 MG PO TABS: 650.0000 mg | ORAL_TABLET | Freq: Four times a day (QID) | ORAL | 0 refills | Status: AC | PRN

## 2018-01-14 NOTE — Discharge Summary (Signed)
Physician Discharge Summary  Kimberly Reed XJO:[id number] DOB: April 21, 1956 DOA: 01/12/2018  PCP: [name], MD  Admit date: 01/12/2018 Discharge date: 01/14/2018  Admitted From: Home Disposition:  Home  Discharge Condition:Stable CODE STATUS:Full Diet recommendation: Regular  Brief/Interim Summary: Patient is a 62 year old female with past medical history of metastatic  breast cancer, BRCA mutation, metastases to bone who presented to the emergency department  with complaints of  generalized weakness and fatigue.  Patient was seen by her oncologist  and was found to have hyponatremia and leukocytosis and was sent to the emergency department for further evaluation.  Patient found to have acute kidney injury on presentation.  UA was positive for UTI.  Patient was admitted for further management. Patient was a started on IV antibiotics, ceftriaxone.  Also started on IV fluids.  Her kidney function gradually improved with hydration.  Urine culture grew mixed organisms. This morning, patient is afebrile.  She feels comfortable.  Patient stable for discharge home today.  She is to follow-up with her PCP in a week to check her CBC and BMP. She also has follow-up appointment with her oncologist coming soon.  Following problems were addressed during her hospitalization:  Acute kidney injury:Likely secondary to urinary tract infection, dehydration,NSAIDs.  Kidney function improved with IV fluids..  Renal ultrasound found to be normal.  Patient was taking NSAIDs secondary to pain from her lymphedema of the left arm.  Would recommend to stop taking NSAIDs. She needs to check her kidney function by doing a BMP test in a week.  Recommend to drink plenty of fluids.  Urinary tract infection: UA suggestive of UTI .Urine culture grew mixed organisms.  We will continue antibiotic for 3 more days.  Patient is afebrile.  Lymphedema of the left arm: Continue supportive care and pain management.  Avoid  NSAIDs.Recommend tylenol.Denied to take any opiates.  Metastatic left breast cancer: Follows with her oncologist Dr. Keene Breath.  Status post treatment with neoadjuvant chemotherapy with Adriamycin and Cytoxan followed by Taxol.    Her scans done as an outpatient has not shown any progression of the disease.  Hypothyroidism: Continue Synthyroid  Leukocytosis: This could be a chronic issue most likely associated with her malignancy.  Check CBC in a week    Discharge Diagnoses:  Principal Problem:   Acute kidney failure ([place]) Active Problems:   Breast cancer of upper-outer quadrant of left female breast ([place])   Acute lower UTI   Lymphedema of arm    Discharge Instructions  Discharge Instructions    Diet - low sodium heart healthy   Complete by:  As directed    Discharge instructions   Complete by:  As directed    1) Take prescribed medications as instructed. 2)Follow up with your PMD in a week. 3)Follow up with your oncologist. 4)Do a CBC and BMP test in a week to check your white cell counts and kidney function. 5) Drink plenty of fluids.   Increase activity slowly   Complete by:  As directed      Allergies as of 01/14/2018      Reactions   Other Nausea Only, Other (See Comments)   Antibiotics - Pt cannot identify which antibiotics she reacts to.  Make her nausous and gives her headache.   Effexor [venlafaxine] Nausea And Vomiting      Medication List    STOP taking these medications   ibuprofen 200 MG tablet Commonly known as:  ADVIL,MOTRIN     TAKE these medications  acetaminophen 325 MG tablet Commonly known as:  TYLENOL Take 2 tablets (650 mg total) by mouth every 6 (six) hours as needed for mild pain or moderate pain (or Fever >/= 101).   cefdinir 300 MG capsule Commonly known as:  OMNICEF Take 1 capsule (300 mg total) by mouth 2 (two) times daily for 3 days. Start taking on:  01/15/2018   COMBIGAN 0.2-0.5 % ophthalmic solution Generic drug:   brimonidine-timolol Place 1 drop into the right eye every 12 (twelve) hours.   dorzolamide 2 % ophthalmic solution Commonly known as:  TRUSOPT Place 1 drop into the right eye 2 (two) times daily.   Fish Oil 1000 MG Caps Take 1,000 mg by mouth daily.   levothyroxine 150 MCG tablet Commonly known as:  SYNTHROID, LEVOTHROID TAKE 1 TABLET BY MOUTH EVERY DAY ON EMPTY STOMACH IN THE MORNING   Travoprost (BAK Free) 0.004 % Soln ophthalmic solution Commonly known as:  TRAVATAN Place 1 drop into the right eye at bedtime.      Follow-up Information    [name], MD. Schedule an appointment as soon as possible for a visit in 1 week(s).   Specialty:  Family Medicine Contact information: [place] [address] [phone]          Allergies  Allergen Reactions  . Other Nausea Only and Other (See Comments)    Antibiotics - Pt cannot identify which antibiotics she reacts to.  Make her nausous and gives her headache.  . Effexor [Venlafaxine] Nausea And Vomiting    Consultations: None  Procedures/Studies: US Renal  Result Date: 01/13/2018 CLINICAL DATA:  Acute kidney injury. EXAM: RENAL / URINARY TRACT ULTRASOUND COMPLETE COMPARISON:  Abdomen and pelvis CT dated 01/10/2016. FINDINGS: Right Kidney: Length: 12.4 cm. Echogenicity within normal limits. No mass or hydronephrosis visualized. Left Kidney: Length: 12.0 cm. Echogenicity within normal limits. No mass or hydronephrosis visualized. Bladder: Appears normal for degree of bladder distention. IMPRESSION: Normal examination. Electronically Signed   By: Claudie Revering M.D.   On: 01/13/2018 10:16    (Echo, Carotid, EGD, Colonoscopy, ERCP)    Subjective: Patient seen and examined the bedside this morning.  Feels comfortable.  No new issues/events.  Stable for discharge to home today.   Discharge Exam: Vitals:   01/13/18 2104 01/14/18 0435  BP: 125/78 135/78  Pulse: (!) 112 (!) 114  Resp: 20 20  Temp: (!)  100.7 F (38.2 C) 97.8 F (36.6 C)  SpO2: 96% 99%   Vitals:   01/13/18 0902 01/13/18 1258 01/13/18 2104 01/14/18 0435  BP: 120/71 110/71 125/78 135/78  Pulse: 95 100 (!) 112 (!) 114  Resp: _0 Temp:  98 F (36.7 C) (!) 100.7 F (38.2 C) 97.8 F (36.6 C)  TempSrc:  Oral Oral Oral  SpO2: 100% 100% 96% 99%  Weight:      Height:        General: Pt is alert, awake, not in acute distress,obese Cardiovascular: RRR, S1/S2 +, no rubs, no gallops Respiratory: CTA bilaterally, no wheezing, no rhonchi Abdominal: Soft, NT, ND, bowel sounds + Extremities: Lymphedema of the left upper extremity, no cyanosis    The results of significant diagnostics from this hospitalization (including imaging, microbiology, ancillary and laboratory) are listed below for reference.     Microbiology: Recent Results (from the past 240 hour(s))  Urine Culture     Status: Abnormal   Collection Time: 01/13/18  2:40 AM  Result Value Ref Range  Status   Specimen Description   Final    URINE, RANDOM Performed at [facility] [address], [place], [place] [address]    Special Requests   Final    NONE Performed at [facility], [place] [address] [place], [place] [address]    Culture MULTIPLE SPECIES PRESENT, SUGGEST RECOLLECTION (A)  Final   Report Status 01/14/2018 FINAL  Final     Labs: BNP (last 3 results) No results for input(s): BNP in the last 8760 hours. Basic Metabolic Panel: Recent Labs  Lab 01/11/18 1202 01/12/18 2347 01/13/18 0428 01/14/18 0413  NA 135* 135 136 134*  K 3.7 4.0 4.0 3.4*  CL 101 99* 100* 103  CO2 22 22 19* 18*  GLUCOSE 113 106* 103* 102*  BUN 21 44* 43* 44*  CREATININE 1.16* 2.58* 2.42* 1.69*  CALCIUM 10.5* 10.1 9.7 9.8   Liver Function Tests: Recent Labs  Lab 01/11/18 1202 01/12/18 2347  AST 90* 86*  ALT 41 38  ALKPHOS 315* 322*  BILITOT 1.4* 1.0  PROT 7.7 7.6  ALBUMIN 2.5* 2.7*   No results for input(s):  LIPASE, AMYLASE in the last 168 hours. No results for input(s): AMMONIA in the last 168 hours. CBC: Recent Labs  Lab 01/11/18 1202 01/12/18 2347 01/14/18 0413  WBC 16.3* 18.5* 16.7*  NEUTROABS 12.8* 14.4* 12.0*  HGB  --  13.3 11.7*  HCT 37.9 38.1 34.9*  MCV 88.6 87.8 87.5  PLT 409* 416* 372   Cardiac Enzymes: Recent Labs  Lab 01/13/18 0428  CKTOTAL 50   BNP: Invalid input(s): POCBNP CBG: Recent Labs  Lab 01/12/18 2354  GLUCAP 90   D-Dimer No results for input(s): DDIMER in the last 72 hours. Hgb A1c No results for input(s): HGBA1C in the last 72 hours. Lipid Profile No results for input(s): CHOL, HDL, LDLCALC, TRIG, CHOLHDL, LDLDIRECT in the last 72 hours. Thyroid function studies No results for input(s): TSH, T4TOTAL, T3FREE, THYROIDAB in the last 72 hours.  Invalid input(s): FREET3 Anemia work up No results for input(s): VITAMINB12, FOLATE, FERRITIN, TIBC, IRON, RETICCTPCT in the last 72 hours. Urinalysis    Component Value Date/Time   COLORURINE AMBER (A) 01/13/2018 0004   APPEARANCEUR CLOUDY (A) 01/13/2018 0004   LABSPEC 1.020 01/13/2018 0004   LABSPEC 1.015 10/05/2014 1324   PHURINE 5.0 01/13/2018 0004   GLUCOSEU NEGATIVE 01/13/2018 0004   GLUCOSEU Negative 10/05/2014 1324   HGBUR SMALL (A) 01/13/2018 0004   BILIRUBINUR SMALL (A) 01/13/2018 0004   BILIRUBINUR Negative 10/05/2014 1324   KETONESUR 5 (A) 01/13/2018 0004   PROTEINUR 100 (A) 01/13/2018 0004   UROBILINOGEN 0.2 10/05/2014 1324   NITRITE NEGATIVE 01/13/2018 0004   LEUKOCYTESUR SMALL (A) 01/13/2018 0004   LEUKOCYTESUR Trace 10/05/2014 1324   Sepsis Labs Invalid input(s): PROCALCITONIN,  WBC,  LACTICIDVEN Microbiology Recent Results (from the past 240 hour(s))  Urine Culture     Status: Abnormal   Collection Time: 01/13/18  2:40 AM  Result Value Ref Range Status   Specimen Description   Final    URINE, RANDOM Performed at [facility], [place] [address]  [place], [place] [address]    Special Requests   Final    NONE Performed at [facility], [place] [address] [place],  [address]    Culture MULTIPLE SPECIES PRESENT, SUGGEST RECOLLECTION (A)  Final   Report Status 01/14/2018 FINAL  Final     Time coordinating discharge: Over 30 minutes  SIGNED:   Shelly Coss, MD  [facility] 01/14/2018, 11:28 AM Pager [phone]  If 7PM-7AM, please contact night-coverage [email or web address] Password [email or web address]

## 2018-01-14 NOTE — Progress Notes (Signed)
Pt states that she drove herself to the hospital. She verbalized understanding of D/C instuctions

## 2018-01-17 ENCOUNTER — Telehealth: Payer: Self-pay

## 2018-01-17 ENCOUNTER — Telehealth: Payer: Self-pay | Admitting: *Deleted

## 2018-01-17 DIAGNOSIS — C50412 Malignant neoplasm of upper-outer quadrant of left female breast: Secondary | ICD-10-CM

## 2018-01-17 NOTE — Telephone Encounter (Signed)
Received VM from pt regarding she would like a referral to the "vertigo" clinic and would like to know how to go about filling the prescription for a compression sleeve.  Per Dr Lindi Adie - pt can have a referral to balance and vestibular rehab program at [facility]. Notified pt and put in referral- spoke to "Kizzy" and they have received referral and they will contact pt with an appt date/time.  Gave phone number and location to pt for [facility] and explained to her to take prescription and they will measure your arm and order compression sleeve.  Pt voiced understanding and no other needs per pt at this time. I let her know vertigo referral will contact her with appt as well.

## 2018-01-17 NOTE — Telephone Encounter (Signed)
Pt left VM regarding she needs to cancel the CT appt due to her lymphedema in in left arm until she can have PT. Noted referrals for PT and vertigo clinic.  Spoke to [place], she said someone is coming to pt's home tomorrow.  Called pt and she said she got lost trying to find vertigo clinic and they rescheduled her appt for tomorrow.  Told pt home health is coming tomorrow - pt said no one has called her yet and she was planning to see PT for lymphedema at place in [place] and she is unsure she wants home health to come out, have them to call before coming per pt.  Will notify [facility] to call pt before arrival to home. Canceled CT that is scheduled for 01/20/2018 per pt request. Notified Dr Lindi Adie. Pt has office appt on 4/9- can check with pt it gets closer to the appt if she is able to have CT rescheduled.

## 2018-01-17 NOTE — Telephone Encounter (Signed)
Kimberly Reed called to ask question but ended call abruptly.  "Forgot to ask Dr. Lindi Adie if the scanner will put pressure on my arm.  The arm with lymphedema is huge and inflamed.  excruciating pain if arm is pressed.   I have to go now.  I'll call back."

## 2018-01-19 ENCOUNTER — Encounter: Payer: Self-pay | Admitting: Family Medicine

## 2018-01-19 ENCOUNTER — Ambulatory Visit: Payer: Self-pay | Admitting: *Deleted

## 2018-01-19 ENCOUNTER — Encounter: Payer: Self-pay | Admitting: Neurology

## 2018-01-19 ENCOUNTER — Ambulatory Visit: Payer: BLUE CROSS/BLUE SHIELD | Admitting: Family Medicine

## 2018-01-19 ENCOUNTER — Telehealth: Payer: Self-pay | Admitting: Family Medicine

## 2018-01-19 VITALS — BP 111/77 | HR 76 | Temp 97.3°F | Ht 65.0 in | Wt 282.6 lb

## 2018-01-19 DIAGNOSIS — R609 Edema, unspecified: Secondary | ICD-10-CM | POA: Diagnosis not present

## 2018-01-19 DIAGNOSIS — Z170 Estrogen receptor positive status [ER+]: Secondary | ICD-10-CM

## 2018-01-19 DIAGNOSIS — N179 Acute kidney failure, unspecified: Secondary | ICD-10-CM | POA: Diagnosis not present

## 2018-01-19 DIAGNOSIS — D72829 Elevated white blood cell count, unspecified: Secondary | ICD-10-CM

## 2018-01-19 DIAGNOSIS — L03114 Cellulitis of left upper limb: Secondary | ICD-10-CM

## 2018-01-19 DIAGNOSIS — R059 Cough, unspecified: Secondary | ICD-10-CM

## 2018-01-19 DIAGNOSIS — C50412 Malignant neoplasm of upper-outer quadrant of left female breast: Secondary | ICD-10-CM

## 2018-01-19 DIAGNOSIS — E785 Hyperlipidemia, unspecified: Secondary | ICD-10-CM

## 2018-01-19 DIAGNOSIS — Z7689 Persons encountering health services in other specified circumstances: Secondary | ICD-10-CM

## 2018-01-19 DIAGNOSIS — R748 Abnormal levels of other serum enzymes: Secondary | ICD-10-CM

## 2018-01-19 DIAGNOSIS — E039 Hypothyroidism, unspecified: Secondary | ICD-10-CM | POA: Diagnosis not present

## 2018-01-19 DIAGNOSIS — R29898 Other symptoms and signs involving the musculoskeletal system: Secondary | ICD-10-CM | POA: Diagnosis not present

## 2018-01-19 DIAGNOSIS — I890 Lymphedema, not elsewhere classified: Secondary | ICD-10-CM

## 2018-01-19 DIAGNOSIS — R05 Cough: Secondary | ICD-10-CM

## 2018-01-19 DIAGNOSIS — E876 Hypokalemia: Secondary | ICD-10-CM | POA: Diagnosis not present

## 2018-01-19 LAB — CBC WITH DIFFERENTIAL/PLATELET
BASOS ABS: 0.1 10*3/uL (ref 0.0–0.1)
Basophils Relative: 0.8 % (ref 0.0–3.0)
Eosinophils Absolute: 0.3 10*3/uL (ref 0.0–0.7)
Eosinophils Relative: 1.9 % (ref 0.0–5.0)
HCT: 36.4 % (ref 36.0–46.0)
Hemoglobin: 11.6 g/dL — ABNORMAL LOW (ref 12.0–15.0)
LYMPHS ABS: 1.9 10*3/uL (ref 0.7–4.0)
Lymphocytes Relative: 11.6 % — ABNORMAL LOW (ref 12.0–46.0)
MCHC: 31.9 g/dL (ref 30.0–36.0)
MCV: 89.5 fl (ref 78.0–100.0)
MONO ABS: 1.2 10*3/uL — AB (ref 0.1–1.0)
MONOS PCT: 7.4 % (ref 3.0–12.0)
NEUTROS PCT: 78.3 % — AB (ref 43.0–77.0)
Neutro Abs: 12.7 10*3/uL — ABNORMAL HIGH (ref 1.4–7.7)
Platelets: 441 10*3/uL — ABNORMAL HIGH (ref 150.0–400.0)
RBC: 4.06 Mil/uL (ref 3.87–5.11)
RDW: 15.6 % — ABNORMAL HIGH (ref 11.5–15.5)
WBC: 16.2 10*3/uL — ABNORMAL HIGH (ref 4.0–10.5)

## 2018-01-19 LAB — BASIC METABOLIC PANEL
BUN: 13 mg/dL (ref 6–23)
CHLORIDE: 101 meq/L (ref 96–112)
CO2: 25 meq/L (ref 19–32)
Calcium: 10 mg/dL (ref 8.4–10.5)
Creatinine, Ser: 0.73 mg/dL (ref 0.40–1.20)
GFR: 85.4 mL/min (ref >60.00)
GLUCOSE: 108 mg/dL — AB (ref 70–99)
Potassium: 4.2 mEq/L (ref 3.5–5.1)
SODIUM: 135 meq/L (ref 135–145)

## 2018-01-19 LAB — TSH: TSH: 9.84 u[IU]/mL — ABNORMAL HIGH (ref 0.35–4.50)

## 2018-01-19 MED ORDER — DOXYCYCLINE HYCLATE 100 MG PO TABS: 100.0000 mg | ORAL_TABLET | Freq: Two times a day (BID) | ORAL | 0 refills | Status: DC

## 2018-01-19 NOTE — Patient Instructions (Addendum)
Start antibiotic prescribe today for elbow.  Please have cxr completed for cough They will call you to schedule for [place] of arm.   I will call you when all results are available and we will discuss further treatment.    We will discuss all  The other concerns once I get records to review and place referrals as needed/appropriate.   If any worsening cough, weakness, dizziness then please be seen in the emergency room.

## 2018-01-19 NOTE — Progress Notes (Signed)
Patient ID: Kimberly Reed, female  DOB: August 13, 1956, 62 y.o.   MRN: [id number] Patient Care Team    Relationship Specialty Notifications Start End  Ma Hillock, DO PCP - General Family Medicine  01/19/18   Nicholas Lose, MD Consulting Physician Hematology and Oncology All results, Admissions 05/28/14   [name], MD Consulting Physician General Surgery All results, Admissions 05/28/14   Eppie Gibson, MD Attending Physician Radiation Oncology All results, Admissions 05/28/14   Particia Nearing, MD Referring Physician Gastroenterology  01/21/18   Delrae Rend, MD Consulting Physician Endocrinology  01/21/18   Kathrynn Ducking, MD Consulting Physician Neurology  01/21/18     Chief Complaint  Patient presents with  . Establish Care    pt c/o possible cellulitis of the left arm.    Subjective:  Kimberly Reed is a 62 y.o.  female present for new patient establishment. All past medical history, surgical history, allergies, family history, immunizations, medications and social history were obtained and entered in the electronic medical record today. All recent labs, ED visits and hospitalizations within the last year were reviewed.  Presents today as a transfer patient from [place] PCP.  It is a poor historian.  She has multiple chronic and acute condition she would like covered today.  It was also hospitalized this past week for presumed UTI after leukocytosis.  Hypothyroidism, unspecified type Patient reports she is on 150 mcg of levothyroxine daily.  Review of PCP notes suggest she has been noncompliant and was recently started back on 137 mcg of levothyroxine daily.  Complaining of weakness and fatigue.  She reports she has an appointment next week with Dr. Buddy Duty, endocrinology, to establish.  Hypokalemia/AKI (acute kidney injury) ([place]) Continue to acute illness and NSAID use.  Recent hospitalization for this condition.  Patient reports she is still weak and tired.  She is worried she  still has an infection in her body.  She is drinking much more water and trying to stay hydrated.  She feels very thirsty despite drinking water.  Leukocytosis, unspecified type Patient has had elevated leukocytes when presenting to her oncology office.  She was told to go to the emergency room in [place] to weakness and leukocytosis.  States she feels so bad she is in a wheelchair because she is weak.  She has not been in a wheelchair prior to this acute illness.  Prior to March, WBCs have been normal.  January 07, 2018 WBC was mildly elevated at 12.9 at her prior PCPs office.  There were 16 2018 WBCs were normal.  Cellulitis of left elbow Seems to be new.  Patient states her left elbow is hurting, and it is red.  Denies any drainage.  Denies any fever.  She states she feels horrible.  She states she feels weak.  Cough Denies fever, chills, nausea or vomit.  As per above complains of acute illness, weakness and feeling horrible.  Cough is nonproductive.  She did have a recent hospitalization.  Lymphedema of left upper extremity Chronic lymphedema of left upper extremity secondary to metastatic breast cancer and lymph node resection.  However patient states over the last few weeks her left upper extremity has become more swollen.  She has seen oncology, which prescribed her a new lymphedema sleeve.  She saw an orthopedic.  She also attempted to start cycle therapy for her lymphedema and [facility].  However they wanted to rule out other causes of her lymphedema before starting therapeutic massage and PT.  She then presented here as a new patient.  Weakness of both legs/dizziness Over the past few weeks.  She is now in a wheelchair.  She states she has never had to use the wheelchair before.  She states her legs are too weak to support her.  Reports oncology has referred her to the "[facility] ".  Elevated liver enzymes Elevated liver enzymes first noted August 10, 2017, repeated August 17, 2017  again elevated with elevated alk phos.  All labs have now been abstracted into this EMR to trend.  If her enzymes continue to be elevated.  Oncology had ordered CT abdomen pelvis.  Patient was unable to get CT abdomen pelvis this past week secondary to hospitalization, weakness and illness.  She Did have a chronic hepatitis panel drawn in October at her prior PCP, chronic hepatitis panel was negative.   Malignant neoplasm of upper-outer quadrant of left breast in female, estrogen receptor positive ([place]) Followed closely by oncology.     Depression screen [facility] 2/9 01/21/2018 04/01/2015  Decreased Interest 0 0  Down, Depressed, Hopeless 0 0  PHQ - 2 Score 0 0  Some recent data might be hidden   No flowsheet data found.      Fall Risk  04/01/2015 11/06/2014 10/23/2014 10/05/2014 09/21/2014  Falls in the past year? No No No No No     Immunization History  Administered Date(s) Administered  . Influenza Whole 07/31/2008  . Influenza,inj,Quad PF,6+ Mos 08/16/2014, 08/22/2015    No exam data present  Past Medical History:  Diagnosis Date  . Anemia   . Anxiety   . Atypical chest pain   . Breast cancer ([place]) 05/2014   left. completed chemo 10/2014. Did not tolerate anti-estrogen.   . Cancer ([place])    left breast dx. 8'76- chemo planned to start 07-09-14- Dr. Lindi Adie, [facility] follows  . Depression   . Family history of heart disease   . Glaucoma   . Gluten intolerance    Dr. Collene Mares  . Headache syndrome 10/13/2016  . History of syncope 2008   loss of bladder control eval by Neuro  . Hot flashes   . Hyperlipemia   . Hypothyroid   . Insomnia   . Lymphedema    L arm  . Migraine without aura   . Personal history of chemotherapy   . Personal history of radiation therapy   . Shingles 2018  . Wears glasses    Allergies  Allergen Reactions  . Other Nausea Only and Other (See Comments)    Antibiotics - Pt cannot identify which antibiotics she reacts to.  Make her nausous and gives  her headache.  . Effexor [Venlafaxine] Nausea And Vomiting  . Mucinex [Guaifenesin Er] Nausea Only   Past Surgical History:  Procedure Laterality Date  . BREAST BIOPSY Left 06/07/2014   malignant  . BREAST BIOPSY Left 05/24/2014   malignant  . BREAST LUMPECTOMY Left 01/30/2015   malignant  . BREAST LUMPECTOMY WITH NEEDLE LOCALIZATION AND AXILLARY LYMPH NODE DISSECTION Left 01/30/2015   Procedure: BREAST LUMPECTOMY WITH NEEDLE LOCALIZATION LEFT AXILLARY DISSECTION REMOVAL OF PORT;  Surgeon: [name], MD;  Location: [facility];  Service: General;  Laterality: Left;  . CATARACT EXTRACTION Left   . DILATION AND CURETTAGE OF UTERUS    . ESOPHAGOGASTRODUODENOSCOPY  06/01/14  . EXAMINATION UNDER ANESTHESIA  02/24/2013   Procedure: EXAM UNDER ANESTHESIA;  Surgeon: Azalia Bilis, MD;  Location: [facility];  Service: Gynecology;;  with  removal of prolapsing cervical mass; pap smear and endometrial biopsy  . GLAUCOMA SURGERY Left    x1   . PORTACATH PLACEMENT Right 07/06/2014   Procedure: PORT A CATH  PLACEMENT;  Surgeon: [name], MD;  Location: [facility];  Service: General;  Laterality: Right;  . TONSILLECTOMY     child  . uterine polyp removed     per hysteroscopy about 1 1/2 yrs ago.   Family History  Problem Relation Age of Onset  . Kidney disease Mother   . Depression Mother   . Early death Mother   . Hypertension Mother   . Heart attack Mother   . Aortic aneurysm Mother   . Hypertension Father   . Dementia Father   . Depression Father   . Kidney disease Brother   . Hypertension Sister   . Heart disease Unknown        "all of moms side"  . Depression Unknown   . Heart failure Cousin    Social History   Socioeconomic History  . Marital status: Single    Spouse name: Not on file  . Number of children: 0  . Years of education: Some college  . Highest education level: Not on file  Occupational History  . 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 Financial resource strain: Not on file  . Food insecurity:    Worry: Not on file    Inability: Not on file  . Transportation needs:    Medical: Not on file    Non-medical: Not on file  Tobacco Use  . Smoking status: Never Smoker  . Smokeless tobacco: Never Used  Substance and Sexual Activity  . Alcohol use: No  . Drug use: No    Comment: in 20's but not now  . Sexual activity: Not Currently    Comment: menarche age 60, P 0, no HRT  Lifestyle  . Physical activity:    Days per week: Not on file    Minutes per session: Not on file  . Stress: Not on file  Relationships  . Social connections:    Talks on phone: Not on file    Gets together: Not on file    Attends religious service: Not on file    Active member of club or organization: Not on file    Attends meetings of clubs or organizations: Not on file    Relationship status: Not on file  . Intimate partner violence:    Fear of current or ex partner: Not on file    Emotionally abused: Not on file    Physically abused: Not on file    Forced sexual activity: Not on file  Other Topics Concern  . Not on file  Social History Narrative   Single.  College.  Lives alone.  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   Right-handed   Dairy products, takes a daily vitamin, drinks caffeine, uses herbal remedies.   Alarm at home.  Wears her seatbelt.      Allergies as of 01/19/2018      Reactions   Other Nausea Only, Other (See Comments)   Antibiotics - Pt cannot identify which antibiotics she reacts to.  Make her nausous and gives her headache.   Effexor [venlafaxine] Nausea And Vomiting      Medication List        Accurate as of 01/19/18 11:59 PM. Always  use your most recent med list.          acetaminophen 325 MG tablet Commonly known as:  TYLENOL Take 2 tablets (650 mg total) by mouth every 6 (six) hours as needed for mild pain or  moderate pain (or Fever >/= 101).   COMBIGAN 0.2-0.5 % ophthalmic solution Generic drug:  brimonidine-timolol Place 1 drop into the right eye every 12 (twelve) hours.   dorzolamide 2 % ophthalmic solution Commonly known as:  TRUSOPT Place 1 drop into the right eye 2 (two) times daily.   doxycycline 100 MG tablet Commonly known as:  VIBRA-TABS Take 1 tablet (100 mg total) by mouth 2 (two) times daily.   Fish Oil 1000 MG Caps Take 1,000 mg by mouth daily.   levothyroxine 150 MCG tablet Commonly known as:  SYNTHROID, LEVOTHROID TAKE 1 TABLET BY MOUTH EVERY DAY ON EMPTY STOMACH IN THE MORNING   Travoprost (BAK Free) 0.004 % Soln ophthalmic solution Commonly known as:  TRAVATAN Place 1 drop into the right eye at bedtime.       All past medical history, surgical history, allergies, family history, immunizations andmedications were updated in the EMR today and reviewed under the history and medication portions of their EMR.    Recent Results (from the past 2160 hour(s))  CBC and differential     Status: None   Collection Time: 01/10/18 12:00 AM  Result Value Ref Range   Hemoglobin 12.4 12.0 - 16.0   HCT 38 36 - 46   Platelets 363 150 - 399   WBC 99.2   Basic metabolic panel     Status: None   Collection Time: 01/10/18 12:00 AM  Result Value Ref Range   Glucose 99    Creatinine 0.9 0.5 - 1.1  Cortisol     Status: None   Collection Time: 01/11/18 12:01 PM  Result Value Ref Range   Cortisol, Plasma 17.7 ug/dL    Comment: (NOTE) AM    6.7 - 22.6 ug/dL PM   <10.0       ug/dL Performed at [facility], [address], [place], [place] [address]   CMP ([place] only)     Status: Abnormal   Collection Time: 01/11/18 12:02 PM  Result Value Ref Range   Sodium 135 (L) 136 - 145 mmol/L   Potassium 3.7 3.5 - 5.1 mmol/L   Chloride 101 98 - 109 mmol/L   CO2 22 22 - 29 mmol/L   Glucose, Bld 113 70 - 140 mg/dL   BUN 21 7 - 26 mg/dL   Creatinine 1.16 (H) 0.60 - 1.10  mg/dL   Calcium 10.5 (H) 8.4 - 10.4 mg/dL   Total Protein 7.7 6.4 - 8.3 g/dL   Albumin 2.5 (L) 3.5 - 5.0 g/dL   AST 90 (H) 5 - 34 U/L   ALT 41 0 - 55 U/L   Alkaline Phosphatase 315 (H) 40 - 150 U/L   Total Bilirubin 1.4 (H) 0.2 - 1.2 mg/dL   GFR, Est Non Af Am 49 (L) >60 mL/min   GFR, Est AFR Am 57 (L) >60 mL/min    Comment: (NOTE) The eGFR has been calculated using the CKD EPI equation. This calculation has not been validated in all clinical situations. eGFR's persistently <60 mL/min signify possible Chronic Kidney Disease.    Anion gap 12 (H) 3 - 11    Comment: Performed at [facility], [place] [address] [place],  [address]  CBC with Differential (Cancer Center Only)     Status: Abnormal   Collection Time: 01/11/18 12:02 PM  Result Value Ref Range   WBC Count 16.3 (H) 3.9 - 10.3 K/uL   RBC 4.28 3.70 - 5.45 MIL/uL   Hemoglobin 12.7 11.6 - 15.9 g/dL   HCT 37.9 34.8 - 46.6 %   MCV 88.6 79.5 - 101.0 fL   MCH 29.7 25.1 - 34.0 pg   MCHC 33.5 31.5 - 36.0 g/dL   RDW 16.0 (H) 11.2 - 14.5 %   Platelet Count 409 (H) 145 - 400 K/uL   Neutrophils Relative % 79 %   Neutro Abs 12.8 (H) 1.5 - 6.5 K/uL   Lymphocytes Relative 13 %   Lymphs Abs 2.1 0.9 - 3.3 K/uL   Monocytes Relative 8 %   Monocytes Absolute 1.3 (H) 0.1 - 0.9 K/uL   Eosinophils Relative 0 %   Eosinophils Absolute 0.1 0.0 - 0.5 K/uL   Basophils Relative 0 %   Basophils Absolute 0.0 0.0 - 0.1 K/uL    Comment: Performed at [facility], [address] [place], [place] [address]  CBC with Differential/Platelet     Status: Abnormal   Collection Time: 01/12/18 11:47 PM  Result Value Ref Range   WBC 18.5 (H) 4.0 - 10.5 K/uL   RBC 4.34 3.87 - 5.11 MIL/uL   Hemoglobin 13.3 12.0 - 15.0 g/dL   HCT 38.1 36.0 - 46.0 %   MCV 87.8 78.0 - 100.0 fL   MCH 30.6 26.0 - 34.0 pg   MCHC 34.9 30.0 - 36.0 g/dL   RDW 15.6 (H) 11.5 - 15.5 %   Platelets 416 (H) 150 - 400 K/uL    Neutrophils Relative % 78 %   Lymphocytes Relative 12 %   Monocytes Relative 10 %   Eosinophils Relative 0 %   Basophils Relative 0 %   Neutro Abs 14.4 (H) 1.7 - 7.7 K/uL   Lymphs Abs 2.2 0.7 - 4.0 K/uL   Monocytes Absolute 1.9 (H) 0.1 - 1.0 K/uL   Eosinophils Absolute 0.0 0.0 - 0.7 K/uL   Basophils Absolute 0.0 0.0 - 0.1 K/uL   Smear Review MORPHOLOGY UNREMARKABLE     Comment: Performed at [facility], [place] [address], [place], [place] [address]  Comprehensive metabolic panel     Status: Abnormal   Collection Time: 01/12/18 11:47 PM  Result Value Ref Range   Sodium 135 135 - 145 mmol/L   Potassium 4.0 3.5 - 5.1 mmol/L   Chloride 99 (L) 101 - 111 mmol/L   CO2 22 22 - 32 mmol/L   Glucose, Bld 106 (H) 65 - 99 mg/dL   BUN 44 (H) 6 - 20 mg/dL   Creatinine, Ser 2.58 (H) 0.44 - 1.00 mg/dL   Calcium 10.1 8.9 - 10.3 mg/dL   Total Protein 7.6 6.5 - 8.1 g/dL   Albumin 2.7 (L) 3.5 - 5.0 g/dL   AST 86 (H) 15 - 41 U/L   ALT 38 14 - 54 U/L   Alkaline Phosphatase 322 (H) 38 - 126 U/L   Total Bilirubin 1.0 0.3 - 1.2 mg/dL   GFR calc non Af Amer 19 (L) >60 mL/min   GFR calc Af Amer 22 (L) >60 mL/min    Comment: (NOTE) The eGFR has been calculated using the CKD EPI equation. This calculation has not been validated in all clinical situations. eGFR's persistently <60 mL/min signify possible Chronic Kidney Disease.  Anion gap 14 5 - 15    Comment: Performed at [facility], [place] [address] [place], [place] [address]  CBG monitoring, ED     Status: None   Collection Time: 01/12/18 11:54 PM  Result Value Ref Range   Glucose-Capillary 90 65 - 99 mg/dL   Comment 1 Notify RN   Urinalysis, Routine w reflex microscopic     Status: Abnormal   Collection Time: 01/13/18 12:04 AM  Result Value Ref Range   Color, Urine AMBER (A) YELLOW    Comment: BIOCHEMICALS MAY BE AFFECTED BY COLOR   APPearance CLOUDY (A) CLEAR   Specific Gravity, Urine 1.020 1.005 - 1.030     pH 5.0 5.0 - 8.0   Glucose, UA NEGATIVE NEGATIVE mg/dL   Hgb urine dipstick SMALL (A) NEGATIVE   Bilirubin Urine SMALL (A) NEGATIVE   Ketones, ur 5 (A) NEGATIVE mg/dL   Protein, ur 100 (A) NEGATIVE mg/dL   Nitrite NEGATIVE NEGATIVE   Leukocytes, UA SMALL (A) NEGATIVE   RBC / HPF 6-30 0 - 5 RBC/hpf   WBC, UA TOO NUMEROUS TO COUNT 0 - 5 WBC/hpf   Bacteria, UA RARE (A) NONE SEEN   Squamous Epithelial / LPF 6-30 (A) NONE SEEN   Mucus PRESENT    Hyaline Casts, UA PRESENT    Amorphous Crystal PRESENT     Comment: Performed at [facility], [place] [address] [place] [address], [place], [place] [address]  Na and K (sodium & potassium), rand urine     Status: None   Collection Time: 01/13/18 12:15 AM  Result Value Ref Range   Sodium, Ur 14 mmol/L   Potassium Urine 59 mmol/L    Comment: Performed at [facility], [place] [address] [place], [place] [address]  Creatinine, urine, random     Status: None   Collection Time: 01/13/18 12:15 AM  Result Value Ref Range   Creatinine, Urine 292.67 mg/dL    Comment: Performed at [facility], [place] [address], [place], [place] [address]  Urine Culture     Status: Abnormal   Collection Time: 01/13/18  2:40 AM  Result Value Ref Range   Specimen Description      URINE, RANDOM Performed at [place] [address] [place], [place] [address]    Special Requests      NONE Performed at [facility], [place] [address], [place], [place] [address]    Culture MULTIPLE SPECIES PRESENT, SUGGEST RECOLLECTION (A)    Report Status 01/14/2018 FINAL   Basic metabolic panel     Status: Abnormal   Collection Time: 01/13/18  4:28 AM  Result Value Ref Range   Sodium 136 135 - 145 mmol/L   Potassium 4.0 3.5 - 5.1 mmol/L   Chloride 100 (L) 101 - 111 mmol/L   CO2 19 (L) 22 - 32 mmol/L   Glucose, Bld 103 (H) 65 - 99 mg/dL   BUN 43 (H) 6 - 20 mg/dL   Creatinine, Ser 2.42 (H) 0.44 - 1.00 mg/dL    Calcium 9.7 8.9 - 10.3 mg/dL   GFR calc non Af Amer 20 (L) >60 mL/min   GFR calc Af Amer 23 (L) >60 mL/min    Comment: (NOTE) The eGFR has been calculated using the CKD EPI equation. This calculation has not been validated in all clinical situations. eGFR's persistently <60 mL/min signify possible Chronic Kidney Disease.    Anion gap 17 (H) 5 - 15  Comment: Performed at [facility], [place] [address] [place], [place] [address]  HIV antibody (Routine Testing)     Status: None   Collection Time: 01/13/18  4:28 AM  Result Value Ref Range   HIV Screen 4th Generation wRfx Non Reactive Non Reactive    Comment: (NOTE) Performed At: [facility] [place], [place] [id number] Rush Farmer MD [id number] Performed at [facility], [place] [address], [place],  [place] [address]   CK     Status: None   Collection Time: 01/13/18  4:28 AM  Result Value Ref Range   Total CK 50 38 - 234 U/L    Comment: Performed at [facility], [place] [address], [place], [place] [address]  CBC with Differential/Platelet     Status: Abnormal   Collection Time: 01/14/18  4:13 AM  Result Value Ref Range   WBC 16.7 (H) 4.0 - 10.5 K/uL   RBC 3.99 3.87 - 5.11 MIL/uL   Hemoglobin 11.7 (L) 12.0 - 15.0 g/dL   HCT 34.9 (L) 36.0 - 46.0 %   MCV 87.5 78.0 - 100.0 fL   MCH 29.3 26.0 - 34.0 pg   MCHC 33.5 30.0 - 36.0 g/dL   RDW 15.8 (H) 11.5 - 15.5 %   Platelets 372 150 - 400 K/uL   Neutrophils Relative % 72 %   Lymphocytes Relative 13 %   Monocytes Relative 14 %   Eosinophils Relative 1 %   Basophils Relative 0 %   Neutro Abs 12.0 (H) 1.7 - 7.7 K/uL   Lymphs Abs 2.2 0.7 - 4.0 K/uL   Monocytes Absolute 2.3 (H) 0.1 - 1.0 K/uL   Eosinophils Absolute 0.2 0.0 - 0.7 K/uL   Basophils Absolute 0.0 0.0 - 0.1 K/uL   Smear Review MORPHOLOGY UNREMARKABLE     Comment: Performed at [facility], [place] [address] [place],  [place] [address]  Basic metabolic panel     Status: Abnormal   Collection Time: 01/14/18  4:13 AM  Result Value Ref Range   Sodium 134 (L) 135 - 145 mmol/L   Potassium 3.4 (L) 3.5 - 5.1 mmol/L   Chloride 103 101 - 111 mmol/L   CO2 18 (L) 22 - 32 mmol/L   Glucose, Bld 102 (H) 65 - 99 mg/dL   BUN 44 (H) 6 - 20 mg/dL   Creatinine, Ser 1.69 (H) 0.44 - 1.00 mg/dL   Calcium 9.8 8.9 - 10.3 mg/dL   GFR calc non Af Amer 31 (L) >60 mL/min   GFR calc Af Amer 36 (L) >60 mL/min    Comment: (NOTE) The eGFR has been calculated using the CKD EPI equation. This calculation has not been validated in all clinical situations. eGFR's persistently <60 mL/min signify possible Chronic Kidney Disease.    Anion gap 13 5 - 15    Comment: Performed at [facility], [place] [address] [place], [place] [address]  CBC w/Diff     Status: Abnormal   Collection Time: 01/19/18 11:07 AM  Result Value Ref Range   WBC 16.2 (H) 4.0 - 10.5 K/uL   RBC 4.06 3.87 - 5.11 Mil/uL   Hemoglobin 11.6 (L) 12.0 - 15.0 g/dL   HCT 36.4 36.0 - 46.0 %   MCV 89.5 78.0 - 100.0 fl   MCHC 31.9 30.0 - 36.0 g/dL   RDW 15.6 (H) 11.5 - 15.5 %   Platelets 441.0 (H) 150.0 - 400.0 K/uL   Neutrophils  Relative % 78.3 (H) 43.0 - 77.0 %   Lymphocytes Relative 11.6 (L) 12.0 - 46.0 %   Monocytes Relative 7.4 3.0 - 12.0 %   Eosinophils Relative 1.9 0.0 - 5.0 %   Basophils Relative 0.8 0.0 - 3.0 %   Neutro Abs 12.7 (H) 1.4 - 7.7 K/uL   Lymphs Abs 1.9 0.7 - 4.0 K/uL   Monocytes Absolute 1.2 (H) 0.1 - 1.0 K/uL   Eosinophils Absolute 0.3 0.0 - 0.7 K/uL   Basophils Absolute 0.1 0.0 - 0.1 K/uL  Basic Metabolic Panel (BMET)     Status: Abnormal   Collection Time: 01/19/18 11:07 AM  Result Value Ref Range   Sodium 135 135 - 145 mEq/L   Potassium 4.2 3.5 - 5.1 mEq/L   Chloride 101 96 - 112 mEq/L   CO2 25 19 - 32 mEq/L   Glucose, Bld 108 (H) 70 - 99 mg/dL   BUN 13 6 - 23 mg/dL   Creatinine, Ser 0.73 0.40 - 1.20 mg/dL   Calcium 10.0 8.4 -  10.5 mg/dL   GFR 85.40 >60.00 mL/min  TSH     Status: Abnormal   Collection Time: 01/19/18 11:07 AM  Result Value Ref Range   TSH 9.84 (H) 0.35 - 4.50 uIU/mL    US Renal  Result Date: 01/13/2018 CLINICAL DATA:  Acute kidney injury. EXAM: RENAL / URINARY TRACT ULTRASOUND COMPLETE COMPARISON:  Abdomen and pelvis CT dated 01/10/2016. FINDINGS: Right Kidney: Length: 12.4 cm. Echogenicity within normal limits. No mass or hydronephrosis visualized. Left Kidney: Length: 12.0 cm. Echogenicity within normal limits. No mass or hydronephrosis visualized. Bladder: Appears normal for degree of bladder distention. IMPRESSION: Normal examination. Electronically Signed   By: Claudie Revering M.D.   On: 01/13/2018 10:16     ROS: 14 pt review of systems performed and negative (unless mentioned in an HPI)  Objective: BP 111/77 (BP Location: Right Arm, Patient Position: Sitting, Cuff Size: Large)   Pulse 76   Temp (!) 97.3 F (36.3 C) (Oral)   Ht 5' 5"  (1.651 m)   Wt 282 lb 9.6 oz (128.2 kg)   LMP 02/15/2013   SpO2 97%   BMI 47.03 kg/m  Gen: Afebrile. No acute distress. Nontoxic in appearance, morbid obesity, in a wheelchair today. HENT: AT. [place].  MMM.  Mild cough on exam, no hoarseness on exam. Eyes:Pupils Equal Round Reactive to light, Extraocular movements intact,  Conjunctiva without redness, discharge or icterus. Neck/lymp/endocrine: Supple, no lymphadenopathy CV: RRR no murmur appreciated.   Chest: CTAB, no wheeze, rhonchi or crackles.  Normal respiratory effort.  Good air movement. Skin: Erythema left elbow with small amount of fluctuance noted.  Pitting lymph/edema surrounding left upper extremity and elbow.  No rashes, purpura or petechiae. Warm and well-perfused. Skin intact. Neuro/Msk: No wheelchair today. PERLA. EOMi. Alert. Oriented x3.   Psych:tangential  thought process.  Needs to be refocused.  No Pressured speech.  Normal affect, dress and demeanor. Normal speech. Normal thought content  and judgment.   Assessment/plan: [place] Patient is a 62 y.o. female present for establishment of care. Extensive record Research was required, as well as request of additional records not in the EMR. Pt she is avery complicated patient and new to this practice today with multiple chronic illnesses, acute health illnesses, and recent hospitalization.  Hypothyroidism, unspecified type -After review of records patient has been noncompliant with Synthroid medications.  She has been referred to Dr. Buddy Duty endocrinology, she states she has an appointment set up  with him.  She endorses currently taking 137 mcg Synthroid daily. Repeat TSH is elevated ~10.  -Uncontrolled thyroidism can be an obvious reasoning for fatigue and muscle weakness. - TSH  AKI (acute kidney injury) (HCC)/hypokalemia Repeated BMP today, AK I hypokalemia are completely resolved. - Basic Metabolic Panel (BMET)  Breast cancer: -Continue management by oncology. Leukocytosis, unspecified type/hospitalization  elevated liver enzymes/hyperlipidemia -Review of records elevated liver enzymes is a new condition for her as of October 2018.  They continue to be elevated and rise.  This is concerning given her breast cancer history.  Negative chronic hepatitis panel was completed negative HIV was completed in recent hospitalization. -CT abdominal pelvis was ordered by oncology, however patient could not have completed secondary to weakness and illness in the outpatient setting.  She should have imaging of her abdomen pelvis/liver.  Cough Cough developed after recent hospitalization.  Will obtain chest x-ray today to rule out pneumonia. - DG Chest 2 View; Future--> no acute process. Clear.   Lymphedema of left upper extremity/swelling/Cellulitis of left elbow -Do not strongly feel her increase in left upper extremity edema is secondary to DVT, however will rule her out as potential cause, given metastatic disease could cause her to be  hypercoagulable. - US Venous Img Upper Uni Left; Future--> no DVT.  -She appears to have cellulitis of her left elbow, small area of fluctuance and redness.  Prescribed doxycycline twice daily for 10 days.  It would be best to have potential infection cleared up prior to starting lymph massage with PT. -After infection of elbow is improved, would strongly encourage her to start having physical therapy sessions routinely for with her lymphedema.  She is already established with a lymphedema specialist in [facility]. -Strongly encourage patient to wear her lymphedema sleeve provided to her by her oncologist.  Weakness of both legs/dizziness -Weakness and dizziness of unknown cause as progressed over the last 2 weeks, patient is now wheelchair-bound for unknown reason. -Her neurological history is not clear.  At one time she was seen Dr. Jannifer Franklin, now appears to have seen Dr. Jerrel Ivory for sleep studies she was encouraged to have completed.  Risk for sleep apnea and could be because of potential fatigue and weakness. -Patient is noncompliant with her Synthroid.  After records received from prior PCP patient has opted to not take Synthroid for some time, and then recently told to restart 137 mcg daily.  Testing TSH today. - vestibular rehab and [place] seem to have been placed by neuro and ONC within last few weeks.  - Ambulatory referral to Neurology   Return in about 1 week (around 01/26/2018).  Greater than 60 minutes was spent with patient, greater than 50% of that time was spent face-to-face with patient counseling, coordinating care, gathering medical history and exam.   Note is dictated utilizing voice recognition software. Although note has been proof read prior to signing, occasional typographical errors still can be missed. If any questions arise, please do not hesitate to call for verification.  Electronically signed by: Howard Pouch, DO [place]

## 2018-01-19 NOTE — Telephone Encounter (Signed)
Patient called back to say she thinks she just didn't eat enough food with the medication.  She will take it with more food and see what happens.

## 2018-01-19 NOTE — Telephone Encounter (Signed)
Copied from [place] [id number]. Topic: Quick Communication - See Telephone Encounter >> Jan 19, 2018  3:42 PM Arletha Grippe wrote: CRM for notification. See Telephone encounter for: 01/19/18. Pt returned Suzannes call - she said she is taking the medication with food and still having the same side effects  Please call backa t [phone]

## 2018-01-19 NOTE — Telephone Encounter (Signed)
Tried to call patient back got patient voice mail left message advising patient to try taking medication with food as instructed by Dr Raoul Pitch .

## 2018-01-19 NOTE — Telephone Encounter (Signed)
Left message for patient Dr Raoul Pitch wants patient to take the doxycycline with food to see if she tolerates it since patient c/o being sick when she was here and this may not be related to the medication. Advised patient to return call if unable to tolerate after trying with food.

## 2018-01-19 NOTE — Telephone Encounter (Signed)
Patient is requesting a CB. She states that the medication that Dr. Raoul Pitch gave her is making her feel sick.

## 2018-01-19 NOTE — Telephone Encounter (Signed)
Pt called because she vomited after taking the first dose of doxycycline. She did not have any nausea with it. She is requesting another antibiotic. No other symptoms. Flow notified at  [employer] [facility]. And they will get in touch with patient, pt voiced understanding.[name]   Marland KitchenReason for Disposition . Caller has URGENT medication question about med that PCP prescribed and triager unable to answer question  Answer Assessment - Initial Assessment Questions 1. SYMPTOMS: "Do you have any symptoms?"     vomiting 2. SEVERITY: If symptoms are present, ask "Are they mild, moderate or severe?"     Vomited once after taking the antibiotic  Protocols used: MEDICATION QUESTION CALL-A-AH

## 2018-01-20 ENCOUNTER — Ambulatory Visit (HOSPITAL_BASED_OUTPATIENT_CLINIC_OR_DEPARTMENT_OTHER)
Admission: RE | Admit: 2018-01-20 | Discharge: 2018-01-20 | Disposition: A | Discharge status: Home / Self Care | Payer: BLUE CROSS/BLUE SHIELD | Source: Ambulatory Visit | Attending: Family Medicine | Admitting: Family Medicine

## 2018-01-20 ENCOUNTER — Telehealth: Payer: Self-pay | Admitting: Neurology

## 2018-01-20 ENCOUNTER — Encounter: Payer: Self-pay | Admitting: General Practice

## 2018-01-20 ENCOUNTER — Ambulatory Visit (HOSPITAL_COMMUNITY): Payer: BLUE CROSS/BLUE SHIELD

## 2018-01-20 DIAGNOSIS — R609 Edema, unspecified: Secondary | ICD-10-CM | POA: Diagnosis not present

## 2018-01-20 DIAGNOSIS — R059 Cough, unspecified: Secondary | ICD-10-CM

## 2018-01-20 DIAGNOSIS — R05 Cough: Secondary | ICD-10-CM | POA: Insufficient documentation

## 2018-01-20 DIAGNOSIS — I890 Lymphedema, not elsewhere classified: Secondary | ICD-10-CM | POA: Diagnosis not present

## 2018-01-20 NOTE — Progress Notes (Signed)
[facility] Spiritual Care Note  Acquainted with Kimberly Reed from her dx in 2015, although she pursued alternate support venues at that time. Followed up in consultation with Dr Geralyn Flash nurse May Armel/RN and Webb Silversmith Cunningham/LCSW.   Reached [name] by phone. She utilized opportunity well to share and process concerns about her fatigue and work stress, as well as the ways she is working to address the fatigue and "vertigo-like symptoms" (new PCP, upcoming cardiology appt, seeking handicap-parking placard, lying down at home whenever possible [such as when talking on the phone for support or coordinating appts]). Provided emotional support, normalized feelings, and named as strengths all the ways that she is working to address her challenges and health concerns.  Per pt, she has no local family or friends for support, and she welcomes a spiritual angle of emotional support. She plans to put my number in her phone and knows to call anytime. Additionally, we plan for me to phone her next week to f/u.   [place], [place], [facility] Pager [phone] Voicemail [phone]

## 2018-01-20 NOTE — Telephone Encounter (Signed)
Pt has called stating she would now like to proceed with the sleep study.  Pt is asking to be called re: what she needs to do for this to be initiated

## 2018-01-21 ENCOUNTER — Encounter: Payer: Self-pay | Admitting: Family Medicine

## 2018-01-21 ENCOUNTER — Other Ambulatory Visit: Payer: Self-pay

## 2018-01-21 ENCOUNTER — Ambulatory Visit: Payer: Self-pay

## 2018-01-21 ENCOUNTER — Emergency Department (HOSPITAL_BASED_OUTPATIENT_CLINIC_OR_DEPARTMENT_OTHER)
Admission: EM | Admit: 2018-01-21 | Discharge: 2018-01-21 | Disposition: A | Discharge status: Home / Self Care | Payer: BLUE CROSS/BLUE SHIELD | Attending: Emergency Medicine | Admitting: Emergency Medicine

## 2018-01-21 ENCOUNTER — Encounter (HOSPITAL_BASED_OUTPATIENT_CLINIC_OR_DEPARTMENT_OTHER): Payer: Self-pay | Admitting: *Deleted

## 2018-01-21 ENCOUNTER — Telehealth: Payer: Self-pay | Admitting: Family Medicine

## 2018-01-21 DIAGNOSIS — R29898 Other symptoms and signs involving the musculoskeletal system: Secondary | ICD-10-CM | POA: Insufficient documentation

## 2018-01-21 DIAGNOSIS — Z853 Personal history of malignant neoplasm of breast: Secondary | ICD-10-CM | POA: Insufficient documentation

## 2018-01-21 DIAGNOSIS — M25522 Pain in left elbow: Secondary | ICD-10-CM | POA: Diagnosis present

## 2018-01-21 DIAGNOSIS — E039 Hypothyroidism, unspecified: Secondary | ICD-10-CM | POA: Diagnosis not present

## 2018-01-21 DIAGNOSIS — D72829 Elevated white blood cell count, unspecified: Secondary | ICD-10-CM | POA: Insufficient documentation

## 2018-01-21 DIAGNOSIS — Z79899 Other long term (current) drug therapy: Secondary | ICD-10-CM | POA: Diagnosis not present

## 2018-01-21 DIAGNOSIS — L03114 Cellulitis of left upper limb: Secondary | ICD-10-CM | POA: Insufficient documentation

## 2018-01-21 DIAGNOSIS — R748 Abnormal levels of other serum enzymes: Secondary | ICD-10-CM | POA: Insufficient documentation

## 2018-01-21 DIAGNOSIS — M71122 Other infective bursitis, left elbow: Secondary | ICD-10-CM

## 2018-01-21 LAB — CBC WITH DIFFERENTIAL/PLATELET
BASOS ABS: 0 10*3/uL (ref 0.0–0.1)
Basophils Relative: 0 %
EOS PCT: 1 %
Eosinophils Absolute: 0.2 10*3/uL (ref 0.0–0.7)
HEMATOCRIT: 34.6 % — AB (ref 36.0–46.0)
Hemoglobin: 11.8 g/dL — ABNORMAL LOW (ref 12.0–15.0)
LYMPHS ABS: 2 10*3/uL (ref 0.7–4.0)
Lymphocytes Relative: 12 %
MCH: 28.9 pg (ref 26.0–34.0)
MCHC: 34.1 g/dL (ref 30.0–36.0)
MCV: 84.8 fL (ref 78.0–100.0)
MONOS PCT: 9 %
Monocytes Absolute: 1.5 10*3/uL — ABNORMAL HIGH (ref 0.1–1.0)
NEUTROS PCT: 78 %
Neutro Abs: 13 10*3/uL — ABNORMAL HIGH (ref 1.7–7.7)
PLATELETS: 453 10*3/uL — AB (ref 150–400)
RBC: 4.08 MIL/uL (ref 3.87–5.11)
RDW: 16.1 % — AB (ref 11.5–15.5)
WBC: 16.7 10*3/uL — AB (ref 4.0–10.5)

## 2018-01-21 LAB — BASIC METABOLIC PANEL
ANION GAP: 14 (ref 5–15)
BUN: 14 mg/dL (ref 6–20)
CHLORIDE: 101 mmol/L (ref 101–111)
CO2: 19 mmol/L — ABNORMAL LOW (ref 22–32)
Calcium: 9.4 mg/dL (ref 8.9–10.3)
Creatinine, Ser: 0.93 mg/dL (ref 0.44–1.00)
Glucose, Bld: 93 mg/dL (ref 65–99)
POTASSIUM: 4.1 mmol/L (ref 3.5–5.1)
SODIUM: 134 mmol/L — AB (ref 135–145)

## 2018-01-21 MED ORDER — SODIUM CHLORIDE 0.9 % IV SOLN
1.0000 g | Freq: Once | INTRAVENOUS | Status: AC
  Administered 2018-01-21: 1 g via INTRAVENOUS
  Filled 2018-01-21 (×2): qty 10

## 2018-01-21 MED ORDER — LIDOCAINE HCL (PF) 1 % IJ SOLN
5.0000 mL | Freq: Once | INTRAMUSCULAR | Status: AC
  Administered 2018-01-21: 5 mL
  Filled 2018-01-21: qty 5

## 2018-01-21 MED ORDER — SODIUM CHLORIDE 0.9 % IV BOLUS
500.0000 mL | Freq: Once | INTRAVENOUS | Status: AC
  Administered 2018-01-21: 500 mL via INTRAVENOUS

## 2018-01-21 NOTE — Telephone Encounter (Signed)
Called pt to discuss her labs and imaging studies.  - she interrupted me and stated her elbow infection just broke open and is now draining. It has become more painful.  - Pt was advised to report to ED.   - her WBC remains elevated. I do not feel her leukocytosis is from a UTI which she was treated last week in the hospital ... Culture was not convincing. I have received records from her prior PCP and leukocytosis is new since march 2019 and not chronic as hospital DC note suggested as a possibility (from breast cancer). She does have (persumed new) cellulitis and abscess of left elbow, started tx w/ doxy may need step up therapy and culture.  -Her elevated LFT are newly elevated since October 2018 by records and not chronic. Concerning with breast cancer. Imaging had been ordered by onc, but not scheduled d/t weakness this past weak--> needs image of abd/pelvis. -She is not compliant with Thyroid medication and TSH remains high--> possible cause of weakness.   - CXR clear and left UE [place] negative for DVT.  Pt verbalized understanding and will report to ED. She will follow up here next week.

## 2018-01-21 NOTE — Telephone Encounter (Signed)
1. SITUATION:  Document reason for call.     Pt calling for with complaint of new onset bleeding and drainage from left elbow. She was prescribed Abx Per chart it was (Doxycycline) Wednesday night and Thursday morning left arm felt worse. Pt states that left arm is in pain all the time from the lymphedema. Pt acted very upset when NT attempted to find out more information or clarify what her symptoms are. She stated " you dont understand what is happening and you dont understand. When asked if she was in any pain she yelled," I'm always in Pain!"  2. BACKGROUND: Document any background information (e.g., prior calls, known psychiatric history)     None noted-- 3. ASSESSMENT: Document your nursing assessment. Pt sounded very upset and scared about what was happening. Pt became verbally aggressive when I tried to find out what her symptoms are. She disconnected call telling me she was going to hang up 4. RESPONSE: Document what your response or recommendation was.     Advised pt to go to [facility]. Pt began to say that maybe she should go to the ED. When advised that she needs to go to [facility] asap, she yelled "I have to go home and put my groceries up! You don't understand!   Reason for Disposition . [1] Overly worried caller AND [2] can't be reassured by triager  Answer Assessment - Initial Assessment Questions 1. APPEARANCE of RASH: "Describe the rash."      Pt with lymphedema and bleeding to left elbow 2. LOCATION: "Where is the rash located?"    Left elbow 3. NUMBER: "How many spots are there?"      n/a 4. SIZE: "How big are the spots?" (Inches, centimeters or compare to size of a coin)      n/a 5. ONSET: "When did the rash start?"      The bleeding began today about 1615 6. ITCHING: "Does the rash itch?" If so, ask: "How bad is the itch?"  (Scale 1-10; or mild, moderate, severe)     Did not ask 7. PAIN: "Does the rash hurt?" If so, ask: "How bad is the pain?"  (Scale 1-10; or mild, moderate,  severe)     Pt states pain is always there due to the lymphedema 8. OTHER SYMPTOMS: "Do you have any other symptoms?" (e.g., fever)     Swelling to the upper arm 9. PREGNANCY: "Is there any chance you are pregnant?" "When was your last menstrual period?"     n/a  Answer Assessment - Initial Assessment Questions 1. APPEARANCE of INJURY: "What does the injury look like?"      Pt stated it was the elbow, she was driving noted wetness and she stated it was coming from left elbow 2. SIZE: "How large is the cut?"      Did not ask 3. BLEEDING: "Is it bleeding now?" If so, ask: "Is it difficult to stop?"      yes 4. LOCATION: "Where is the injury located?"      Left elbow  5. ONSET: "How long ago did the injury occur?"      1615 6. MECHANISM: "Tell me how it happened."      Pt stated she went shopping for groceries and when she was in the car after shopping she noted bleeding from the left elbow 7. TETANUS: "When was the last tetanus booster?"     Did not ask 8. PREGNANCY: "Is there any chance you are pregnant?" "When was your  last menstrual period?"     n/a  Answer Assessment - Initial Assessment Questions 1. ONSET: "When did the pain start?"     Pt states that she always has pain due to lymphedema 2. LOCATION: "Where is the pain located?"     Left arm 3. PAIN: "How bad is the pain?" (Scale 1-10; or mild, moderate, severe)   - MILD (1-3): doesn't interfere with normal activities   - MODERATE (4-7): interferes with normal activities (e.g., work or school) or awakens from sleep   - SEVERE (8-10): excruciating pain, unable to do any normal activities, unable to hold a cup of water     Pt stated severe pain 4. WORK OR EXERCISE: "Has there been any recent work or exercise that involved this part of the body?"     Just went shopping at the grocery store 5. CAUSE: "What do you think is causing the arm pain?"   Lymphedema possibly  6. OTHER SYMPTOMS: "Do you have any other symptoms?" (e.g.,  neck pain, swelling, rash, fever, numbness, weakness)     Swelling, bleeding from elbow 7. PREGNANCY: "Is there any chance you are pregnant?" "When was your last menstrual period?"     n/a  Answer Assessment - Initial Assessment Questions 1. SITUATION:  Document reason for call.     Pt calling for with complaint of new onset bleeding and drainage from left elbow. She was prescribed Abx Per chart it was (Doxycycline) Wednesday night and Thursday morning left arm felt worse. Pt states that left arm is in pain all the time from the lymphedema. Pt acted very upset when NT attempted to find out more information or clarify what her symptoms are. She stated " you dont understand what is happening and you dont understand. When asked if she was in any pain she yelled," I'm always in Pain!"  2. BACKGROUND: Document any background information (e.g., prior calls, known psychiatric history)     None noted-- 3. ASSESSMENT: Document your nursing assessment. Pt sounded very upset and scared about what was happening. Pt became verbally aggressive when I tried to find out what her symptoms are. She disconnected call telling me she was going to hang up 4. RESPONSE: Document what your response or recommendation was.     Advised pt to go to [facility]. Pt began to say that maybe she should go to the ED. When advised that she needs to go to [facility] asap, she yelled "I have to go home and put my groceries up! You don't understand!  Protocols used: DIFFICULT CALLER-A-AH, RASH OR REDNESS - LOCALIZED-A-AH, CUTS AND LACERATIONS-A-AH, ARM PAIN-A-AH

## 2018-01-21 NOTE — ED Provider Notes (Signed)
[facility] EMERGENCY DEPARTMENT Provider Note   CSN: [id number] Arrival date & time: 01/21/18  1731     History   Chief Complaint Chief Complaint  Patient presents with  . Arm Pain    HPI Kimberly Reed is a 62 y.o. female.  She has a history of lymphedema in her left arm along with some CKD.  She was recently hospalized for fatigue and weakness.  She had a bump in her creatinine.  The last few days she has had some redness and pain over her left elbow which is her lymphedema side.  She saw her PCP and got put on doxycycline for possible cellulitis.  It was draining fluid today from the elbow so she was informed to come to the hospital for evaluation.  She continues with feeling lightheaded and weak.  She denies fever.  The history is provided by the patient.  Arm Pain  This is a new problem. The current episode started more than 1 week ago. The problem occurs constantly. The problem has been gradually worsening. Pertinent negatives include no chest pain, no abdominal pain, no headaches and no shortness of breath. The symptoms are aggravated by bending. The symptoms are relieved by rest. The treatment provided mild relief.    Past Medical History:  Diagnosis Date  . Anemia   . Anxiety   . Atypical chest pain   . Breast cancer ([place]) 05/2014   left. completed chemo 10/2014. Did not tolerate anti-estrogen.   . Cancer ([place])    left breast dx. 7'09- chemo planned to start 07-09-14- Dr. Lindi Adie[facility]  . Depression   . Family history of heart disease   . Glaucoma   . Gluten intolerance    Dr. Collene Mares  . Headache syndrome 10/13/2016  . History of syncope 2008   loss of bladder control eval by Neuro  . Hot flashes   . Hyperlipemia   . Hypothyroid   . Insomnia   . Lymphedema    L arm  . Migraine without aura   . Personal history of chemotherapy   . Personal history of radiation therapy   . Shingles 2018  . Wears glasses     Patient Active Problem List     Diagnosis Date Noted  . Weakness of both legs 01/21/2018  . Cellulitis of left elbow 01/21/2018  . Leukocytosis 01/21/2018  . Elevated liver enzymes 01/21/2018  . Lymphedema of left upper extremity 01/13/2018  . Glaucoma 01/23/2015  . Breast cancer of upper-outer quadrant of left female breast ([place]) 05/28/2014  . Hyperlipidemia 07/24/2008  . Morbid obesity ([place]) 07/24/2008  . Hypothyroidism 11/29/2007  . GLUCOMA 11/29/2007    Past Surgical History:  Procedure Laterality Date  . BREAST BIOPSY Left 06/07/2014   malignant  . BREAST BIOPSY Left 05/24/2014   malignant  . BREAST LUMPECTOMY Left 01/30/2015   malignant  . BREAST LUMPECTOMY WITH NEEDLE LOCALIZATION AND AXILLARY LYMPH NODE DISSECTION Left 01/30/2015   Procedure: BREAST LUMPECTOMY WITH NEEDLE LOCALIZATION LEFT AXILLARY DISSECTION REMOVAL OF PORT;  Surgeon: [name], MD;  Location: [facility];  Service: General;  Laterality: Left;  . CATARACT EXTRACTION Left   . DILATION AND CURETTAGE OF UTERUS    . ESOPHAGOGASTRODUODENOSCOPY  06/01/14  . EXAMINATION UNDER ANESTHESIA  02/24/2013   Procedure: EXAM UNDER ANESTHESIA;  Surgeon: Azalia Bilis, MD;  Location: [facility];  Service: Gynecology;;  with removal of prolapsing cervical mass; pap smear and endometrial biopsy  . GLAUCOMA  SURGERY Left    x1   . PORTACATH PLACEMENT Right 07/06/2014   Procedure: PORT A CATH  PLACEMENT;  Surgeon: [name], MD;  Location: [facility];  Service: General;  Laterality: Right;  . TONSILLECTOMY     child  . uterine polyp removed     per hysteroscopy about 1 1/2 yrs ago.     OB History    Gravida  0   Para  0   Term  0   Preterm  0   AB  0   Living  0     SAB  0   TAB  0   Ectopic  0   Multiple  0   Live Births  0            Home Medications    Prior to Admission medications   Medication Sig Start Date End Date Taking? Authorizing Provider  acetaminophen (TYLENOL) 325 MG tablet Take 2  tablets (650 mg total) by mouth every 6 (six) hours as needed for mild pain or moderate pain (or Fever >/= 101). 01/14/18   Adhikari, Amrit, MD  brimonidine-timolol (COMBIGAN) 0.2-0.5 % ophthalmic solution Place 1 drop into the right eye every 12 (twelve) hours.     [provider]  dorzolamide (TRUSOPT) 2 % ophthalmic solution Place 1 drop into the right eye 2 (two) times daily.     [provider]  doxycycline (VIBRA-TABS) 100 MG tablet Take 1 tablet (100 mg total) by mouth 2 (two) times daily. 01/19/18   Kuneff, Renee A, DO  levothyroxine (SYNTHROID, LEVOTHROID) 150 MCG tablet TAKE 1 TABLET BY MOUTH EVERY DAY ON EMPTY STOMACH IN THE MORNING 12/29/17   [provider]  Omega-3 Fatty Acids (FISH OIL) 1000 MG CAPS Take 1,000 mg by mouth daily.    [provider]  Travoprost, BAK Free, (TRAVATAN) 0.004 % SOLN ophthalmic solution Place 1 drop into the right eye at bedtime.     [provider]    Family History Family History  Problem Relation Age of Onset  . Kidney disease Mother   . Depression Mother   . Early death Mother   . Hypertension Mother   . Heart attack Mother   . Aortic aneurysm Mother   . Hypertension Father   . Dementia Father   . Depression Father   . Kidney disease Brother   . Hypertension Sister   . Heart disease Unknown        "all of moms side"  . Depression Unknown   . Heart failure Cousin     Social History Social History   Tobacco Use  . Smoking status: Never Smoker  . Smokeless tobacco: Never Used  Substance Use Topics  . Alcohol use: No  . Drug use: No    Comment: in 20's but not now     Allergies   Other; Effexor [venlafaxine]; and Mucinex [guaifenesin er]   Review of Systems Review of Systems  Constitutional: Positive for fatigue. Negative for chills and fever.  HENT: Negative for sore throat.   Eyes: Negative for photophobia.  Respiratory: Negative for shortness of breath.   Cardiovascular: Negative  for chest pain.  Gastrointestinal: Negative for abdominal pain.  Genitourinary: Negative for dysuria.  Musculoskeletal: Positive for joint swelling.  Skin: Positive for wound. Negative for rash.  Neurological: Positive for weakness. Negative for seizures and headaches.     Physical Exam Updated Vital Signs BP 122/77 (BP Location: Right Arm)   Pulse 81  Temp 97.9 F (36.6 C) (Oral)   Resp 20   Ht 5\' 5"  (1.651 m)   Wt 127.9 kg (282 lb)   LMP 02/15/2013   SpO2 98%   BMI 46.93 kg/m   Physical Exam  Constitutional: She appears well-developed and well-nourished.  HENT:  Head: Normocephalic and atraumatic.  Eyes: Conjunctivae are normal.  Neck: Neck supple.  Cardiovascular: Normal rate and regular rhythm.  Pulmonary/Chest: Effort normal and breath sounds normal.  Abdominal: Soft. Bowel sounds are normal.  Musculoskeletal:  Left arm with marketed lymphedema.  She has an area of erythema and fluctuance over her olecranon with a small area that is draining some serosanguineous fluid.  There is no crepitus going up the arm.  She is got intact pulses and cap refill.  Normal sensation.  Neurological: She is alert. GCS eye subscore is 4. GCS verbal subscore is 5. GCS motor subscore is 6.  Skin: Skin is warm and dry.  Psychiatric: She has a normal mood and affect.     ED Treatments / Results  Labs (all labs ordered are listed, but only abnormal results are displayed) Labs Reviewed  BASIC METABOLIC PANEL - Abnormal; Notable for the following components:      Result Value   Sodium 134 (*)    CO2 19 (*)    All other components within normal limits  CBC WITH DIFFERENTIAL/PLATELET - Abnormal; Notable for the following components:   WBC 16.7 (*)    Hemoglobin 11.8 (*)    HCT 34.6 (*)    RDW 16.1 (*)    Platelets 453 (*)    Neutro Abs 13.0 (*)    Monocytes Absolute 1.5 (*)    All other components within normal limits  AEROBIC CULTURE (SUPERFICIAL SPECIMEN)     EKG None  Radiology Dg Chest 2 View  Result Date: 01/20/2018 CLINICAL DATA:  Cough noted shortness of breath EXAM: CHEST - 2 VIEW COMPARISON:  09/10/2016 FINDINGS: The heart size and mediastinal contours are within normal limits. Both lungs are clear. The visualized skeletal structures are unremarkable. Postsurgical changes are noted in the left axilla. IMPRESSION: No active cardiopulmonary disease. Electronically Signed   By: Inez Catalina M.D.   On: 01/20/2018 15:54   US Venous Img Upper Uni Left  Result Date: 01/20/2018 CLINICAL DATA:  Left upper extremity edema chronic in nature EXAM: LEFT UPPER EXTREMITY VENOUS DOPPLER ULTRASOUND TECHNIQUE: Gray-scale sonography with graded compression, as well as color Doppler and duplex ultrasound were performed to evaluate the upper extremity deep venous system from the level of the subclavian vein and including the jugular, axillary, basilic, radial, ulnar and upper cephalic vein. Spectral Doppler was utilized to evaluate flow at rest and with distal augmentation maneuvers. COMPARISON:  None. FINDINGS: Contralateral Subclavian Vein: Respiratory phasicity is normal and symmetric with the symptomatic side. No evidence of thrombus. Normal compressibility. Internal Jugular Vein: No evidence of thrombus. Normal compressibility, respiratory phasicity and response to augmentation. Subclavian Vein: No evidence of thrombus. Normal compressibility, respiratory phasicity and response to augmentation. Axillary Vein: No evidence of thrombus. Normal compressibility, respiratory phasicity and response to augmentation. Cephalic Vein: No evidence of thrombus. Normal compressibility, respiratory phasicity and response to augmentation. Basilic Vein: No evidence of thrombus. Normal compressibility, respiratory phasicity and response to augmentation. Brachial Veins: No evidence of thrombus. Normal compressibility, respiratory phasicity and response to augmentation. Radial Veins:  No evidence of thrombus. Normal compressibility, respiratory phasicity and response to augmentation. Ulnar Veins: No evidence of thrombus. Normal compressibility, respiratory  phasicity and response to augmentation. Venous Reflux:  None visualized. Other Findings:  Significant upper extremity edema is noted. IMPRESSION: No evidence of DVT within the left upper extremity. Electronically Signed   By: Inez Catalina M.D.   On: 01/20/2018 15:53    Procedures .Marland KitchenIncision and Drainage Date/Time: 01/21/2018 11:15 PM Performed by: Hayden Rasmussen, MD Authorized by: Hayden Rasmussen, MD   Consent:    Consent obtained:  Verbal   Consent given by:  Patient   Risks discussed:  Bleeding, incomplete drainage, pain and infection   Alternatives discussed:  No treatment, delayed treatment and referral Location:    Type:  Bursa   Size:  5x5   Location:  Upper extremity   Upper extremity location:  Elbow   Elbow location:  L elbow Pre-procedure details:    Skin preparation:  Betadine Anesthesia (see MAR for exact dosages):    Anesthesia method:  Local infiltration   Local anesthetic:  Lidocaine 1% w/o epi Procedure type:    Complexity:  Simple Procedure details:    Incision types:  Single straight   Scalpel blade:  15   Wound management:  Probed and deloculated   Drainage:  Purulent   Drainage amount:  Copious   Wound treatment:  Wound left open   Packing materials:  1/4 in iodoform gauze Post-procedure details:    Patient tolerance of procedure:  Tolerated well, no immediate complications   (including critical care time)  Medications Ordered in ED Medications  cefTRIAXone (ROCEPHIN) 1 g in sodium chloride 0.9 % 100 mL IVPB (has no administration in time range)  sodium chloride 0.9 % bolus 500 mL (has no administration in time range)     Initial Impression / Assessment and Plan / ED Course  I have reviewed the triage vital signs and the nursing notes.  Pertinent labs & imaging results  that were available during my care of the patient were reviewed by me and considered in my medical decision making (see chart for details).  Clinical Course as of Jan 22 2348  Fri Jan 21, 2018  2347 I discussed at length with the patient regarding doing an I&D and admitting her to the hospital for IV antibiotics versus I&D and continuing her oral medicines.  She states she just got out of the hospital and does not want to be readmitted.  Her white blood cell count is elevated here but is the same as when she was in the hospital last week.  Her creatinine has continued to improve.  She has no fever here.  Patient is willing to return here for wound check in the next day or 2 and follow-up closely with her primary care doctor and understands indications to return if any worsening symptoms or fever.   [MB]    Clinical Course User Index [MB] Hayden Rasmussen, MD    Final Clinical Impressions(s) / ED Diagnoses   Final diagnoses:  Septic bursitis of elbow, left    ED Discharge Orders    None       Hayden Rasmussen, MD 01/22/18 1755

## 2018-01-21 NOTE — Discharge Instructions (Signed)
Your evaluated in the emergency department for some swelling and drainage from your left elbow.  This is most likely a septic bursitis.  We drained a large amount of fluid from the joint and I put a packing in.  He should continue your antibiotics tomorrow.  We probably should reevaluate the wound in a day or 2 but if it is not bothering you and you want to wait till Monday and see your primary care doctor that would be reasonable.  If you experience high fever or worsening pain please return to the emergency department.

## 2018-01-21 NOTE — ED Triage Notes (Signed)
Pt has lmyphodema and cellulitis, her left elbow is bleeding and draining. Left arm is swollen. Was told that she needs to come her to have it cleaned. Pt also stated that she wants to pass out.

## 2018-01-24 ENCOUNTER — Encounter: Payer: Self-pay | Admitting: Family Medicine

## 2018-01-24 ENCOUNTER — Ambulatory Visit: Payer: BLUE CROSS/BLUE SHIELD | Admitting: Family Medicine

## 2018-01-24 VITALS — BP 104/73 | HR 74 | Temp 97.3°F | Ht 65.0 in | Wt 282.8 lb

## 2018-01-24 DIAGNOSIS — L03114 Cellulitis of left upper limb: Secondary | ICD-10-CM

## 2018-01-24 LAB — AEROBIC CULTURE  (SUPERFICIAL SPECIMEN): SPECIAL REQUESTS: NORMAL

## 2018-01-24 LAB — AEROBIC CULTURE W GRAM STAIN (SUPERFICIAL SPECIMEN)

## 2018-01-24 MED ORDER — MUPIROCIN CALCIUM 2 % EX CREA: 1.0000 "application " | TOPICAL_CREAM | Freq: Two times a day (BID) | CUTANEOUS | 0 refills | Status: DC

## 2018-01-24 NOTE — Telephone Encounter (Addendum)
Patient seen in Ed and has scheduled appt with Dr Raoul Pitch today.

## 2018-01-24 NOTE — Progress Notes (Signed)
v    Patient ID: Kimberly Reed, female  DOB: 1956-07-17, 62 y.o.   MRN: [id number] Patient Care Team    Relationship Specialty Notifications Start End  Ma Hillock, DO PCP - General Family Medicine  01/19/18   Nicholas Lose, MD Consulting Physician Hematology and Oncology All results, Admissions 05/28/14   [name], MD Consulting Physician General Surgery All results, Admissions 05/28/14   Eppie Gibson, MD Attending Physician Radiation Oncology All results, Admissions 05/28/14   Particia Nearing, MD Referring Physician Gastroenterology  01/21/18   Delrae Rend, MD Consulting Physician Endocrinology  01/21/18   Kathrynn Ducking, MD Consulting Physician Neurology  01/21/18     Chief Complaint  Patient presents with  . Follow-up    ED    Subjective:  Kimberly Reed is a 62 y.o.  female present for follow-up on cellulitis of left elbow to be seen in the ED.  Hypothyroidism, unspecified type She states she has been taking the 150 mcg of levothyroxine for the last 4 weeks.  She states she has an appointment with Dr. Buddy Duty to establish on April 23.   Complaining of weakness and fatigue.    Leukocytosis, unspecified type Discussed elevated leukocytes with patient today.  Hopefully, the WBCs are from cellulitis.  Discussed the importance of following up with repeat labs. Patient has had elevated leukocytes when presenting to her oncology office.  She was told to go to the emergency room in [place] to weakness and leukocytosis.  States she feels so bad she is in a wheelchair because she is weak.  She has not been in a wheelchair prior to this acute illness.  Prior to March, WBCs have been normal.  January 07, 2018 WBC was mildly elevated at 12.9 at her prior PCPs office.  There were 16 2018 WBCs were normal.  Cellulitis of left elbow She reports she has been taking doxycycline.  She was seen in the ED in which they did an I&D on Friday.  She reports yellow/red drainage since I&D.  She has been  keeping dressing changed daily.  She reports the pain has improved and is resolved.  She still endorses weakness.   Lymphedema of left upper extremity Prior note: Chronic lymphedema of left upper extremity secondary to metastatic breast cancer and lymph node resection.  However patient states over the last few weeks her left upper extremity has become more swollen.  She has seen oncology, which prescribed her a new lymphedema sleeve.  She saw an orthopedic.  She also attempted to start cycle therapy for her lymphedema and [facility].  However they wanted to rule out other causes of her lymphedema before starting therapeutic massage and PT.  She then presented here as a new patient.  Weakness of both legs/dizziness Weakness and dizziness has been a complaint for over a year by prior records.  Patient has been noncompliant with thyroid medications over this time.  Has been seen by neurology.  She has been referred to vertigo clinic. Prior note: Over the past few weeks.  She is now in a wheelchair.  She states she has never had to use the wheelchair before.  She states her legs are too weak to support her.  Reports oncology has referred her to the "vertigo clinic ".  Elevated liver enzymes Prior note: Elevated liver enzymes first noted August 10, 2017, repeated August 17, 2017 again elevated with elevated alk phos.  All labs have now been abstracted into this EMR to trend.  If her enzymes continue to be elevated.  Oncology had ordered CT abdomen pelvis.  Patient was unable to get CT abdomen pelvis this past week secondary to hospitalization, weakness and illness.  She Did have a chronic hepatitis panel drawn in October at her prior PCP, chronic hepatitis panel was negative.   Malignant neoplasm of upper-outer quadrant of left breast in female, estrogen receptor positive ([place]) Followed closely by oncology.     Depression screen [facility] 2/9 01/21/2018 04/01/2015  Decreased Interest 0 0  Down, Depressed,  Hopeless 0 0  PHQ - 2 Score 0 0  Some recent data might be hidden   No flowsheet data found.      Fall Risk  04/01/2015 11/06/2014 10/23/2014 10/05/2014 09/21/2014  Falls in the past year? No No No No No     Immunization History  Administered Date(s) Administered  . Influenza Whole 07/31/2008  . Influenza,inj,Quad PF,6+ Mos 08/16/2014, 08/22/2015    No exam data present  Past Medical History:  Diagnosis Date  . Anemia   . Anxiety   . Atypical chest pain   . Breast cancer ([place]) 05/2014   left. completed chemo 10/2014. Did not tolerate anti-estrogen.   . Cancer ([place])    left breast dx. 4'78- chemo planned to start 07-09-14- Dr. Lindi Adie[facility]  . Depression   . Family history of heart disease   . Glaucoma   . Gluten intolerance    Dr. Collene Mares  . Headache syndrome 10/13/2016  . History of syncope 2008   loss of bladder control eval by Neuro  . Hot flashes   . Hyperlipemia   . Hypothyroid   . Insomnia   . Lymphedema    L arm  . Migraine without aura   . Personal history of chemotherapy   . Personal history of radiation therapy   . Shingles 2018  . Wears glasses    Allergies  Allergen Reactions  . Other Nausea Only and Other (See Comments)    Antibiotics - Pt cannot identify which antibiotics she reacts to.  Make her nausous and gives her headache.  . Effexor [Venlafaxine] Nausea And Vomiting  . Mucinex [Guaifenesin Er] Nausea Only   Past Surgical History:  Procedure Laterality Date  . BREAST BIOPSY Left 06/07/2014   malignant  . BREAST BIOPSY Left 05/24/2014   malignant  . BREAST LUMPECTOMY Left 01/30/2015   malignant  . BREAST LUMPECTOMY WITH NEEDLE LOCALIZATION AND AXILLARY LYMPH NODE DISSECTION Left 01/30/2015   Procedure: BREAST LUMPECTOMY WITH NEEDLE LOCALIZATION LEFT AXILLARY DISSECTION REMOVAL OF PORT;  Surgeon: [name], MD;  Location: [place];  Service: General;  Laterality: Left;  . CATARACT EXTRACTION Left   .  DILATION AND CURETTAGE OF UTERUS    . ESOPHAGOGASTRODUODENOSCOPY  06/01/14  . EXAMINATION UNDER ANESTHESIA  02/24/2013   Procedure: EXAM UNDER ANESTHESIA;  Surgeon: Azalia Bilis, MD;  Location: [facility];  Service: Gynecology;;  with removal of prolapsing cervical mass; pap smear and endometrial biopsy  . GLAUCOMA SURGERY Left    x1   . PORTACATH PLACEMENT Right 07/06/2014   Procedure: PORT A CATH  PLACEMENT;  Surgeon: [name], MD;  Location: [facility];  Service: General;  Laterality: Right;  . TONSILLECTOMY     child  . uterine polyp removed     per hysteroscopy about 1 1/2 yrs ago.   Family History  Problem Relation Age of Onset  . Kidney disease Mother   . Depression Mother   .  Early death Mother   . Hypertension Mother   . Heart attack Mother   . Aortic aneurysm Mother   . Hypertension Father   . Dementia Father   . Depression Father   . Kidney disease Brother   . Hypertension Sister   . Heart disease Unknown        "all of moms side"  . Depression Unknown   . Heart failure Cousin    Social History   Socioeconomic History  . Marital status: Single    Spouse name: Not on file  . Number of children: 0  . Years of education: Some college  . Highest education level: Not on file  Occupational History  . 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 Financial resource strain: Not on file  . Food insecurity:    Worry: Not on file    Inability: Not on file  . Transportation needs:    Medical: Not on file    Non-medical: Not on file  Tobacco Use  . Smoking status: Never Smoker  . Smokeless tobacco: Never Used  Substance and Sexual Activity  . Alcohol use: No  . Drug use: No    Comment: in 20's but not now  . Sexual activity: Not Currently    Comment: menarche age 85, P 0, no HRT  Lifestyle  . Physical activity:    Days per week: Not on file    Minutes per session: Not on file  . Stress: Not on  file  Relationships  . Social connections:    Talks on phone: Not on file    Gets together: Not on file    Attends religious service: Not on file    Active member of club or organization: Not on file    Attends meetings of clubs or organizations: Not on file    Relationship status: Not on file  . Intimate partner violence:    Fear of current or ex partner: Not on file    Emotionally abused: Not on file    Physically abused: Not on file    Forced sexual activity: Not on file  Other Topics Concern  . Not on file  Social History Narrative   Single.  College.  Lives alone.  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   Right-handed   Dairy products, takes a daily vitamin, drinks caffeine, uses herbal remedies.   Alarm at home.  Wears her seatbelt.      Allergies as of 01/24/2018      Reactions   Other Nausea Only, Other (See Comments)   Antibiotics - Pt cannot identify which antibiotics she reacts to.  Make her nausous and gives her headache.   Effexor [venlafaxine] Nausea And Vomiting   Mucinex [guaifenesin Er] Nausea Only      Medication List        Accurate as of 01/24/18 11:59 PM. Always use your most recent med list.          acetaminophen 325 MG tablet Commonly known as:  TYLENOL Take 2 tablets (650 mg total) by mouth every 6 (six) hours as needed for mild pain or moderate pain (or Fever >/= 101).   COMBIGAN 0.2-0.5 % ophthalmic solution Generic drug:  brimonidine-timolol Place 1 drop into the right eye every 12 (twelve) hours.   dorzolamide 2 % ophthalmic solution Commonly known as:  TRUSOPT Place 1 drop into the right eye  2 (two) times daily.   doxycycline 100 MG tablet Commonly known as:  VIBRA-TABS Take 1 tablet (100 mg total) by mouth 2 (two) times daily.   Fish Oil 1000 MG Caps Take 1,000 mg by mouth daily.   levothyroxine 150 MCG tablet Commonly known as:  SYNTHROID, LEVOTHROID TAKE 1 TABLET BY MOUTH EVERY DAY ON EMPTY STOMACH IN THE  MORNING   mupirocin cream 2 % Commonly known as:  BACTROBAN Apply 1 application topically 2 (two) times daily.   Travoprost (BAK Free) 0.004 % Soln ophthalmic solution Commonly known as:  TRAVATAN Place 1 drop into the right eye at bedtime.       All past medical history, surgical history, allergies, family history, immunizations andmedications were updated in the EMR today and reviewed under the history and medication portions of their EMR.    Recent Results (from the past 2160 hour(s))  CBC and differential     Status: None   Collection Time: 01/10/18 12:00 AM  Result Value Ref Range   Hemoglobin 12.4 12.0 - 16.0   HCT 38 36 - 46   Platelets 363 150 - 399   WBC 09.3   Basic metabolic panel     Status: None   Collection Time: 01/10/18 12:00 AM  Result Value Ref Range   Glucose 99    Creatinine 0.9 0.5 - 1.1  Cortisol     Status: None   Collection Time: 01/11/18 12:01 PM  Result Value Ref Range   Cortisol, Plasma 17.7 ug/dL    Comment: (NOTE) AM    6.7 - 22.6 ug/dL PM   <10.0       ug/dL Performed at [facility], [address], [place], [place] [address]   CMP ([place] only)     Status: Abnormal   Collection Time: 01/11/18 12:02 PM  Result Value Ref Range   Sodium 135 (L) 136 - 145 mmol/L   Potassium 3.7 3.5 - 5.1 mmol/L   Chloride 101 98 - 109 mmol/L   CO2 22 22 - 29 mmol/L   Glucose, Bld 113 70 - 140 mg/dL   BUN 21 7 - 26 mg/dL   Creatinine 1.16 (H) 0.60 - 1.10 mg/dL   Calcium 10.5 (H) 8.4 - 10.4 mg/dL   Total Protein 7.7 6.4 - 8.3 g/dL   Albumin 2.5 (L) 3.5 - 5.0 g/dL   AST 90 (H) 5 - 34 U/L   ALT 41 0 - 55 U/L   Alkaline Phosphatase 315 (H) 40 - 150 U/L   Total Bilirubin 1.4 (H) 0.2 - 1.2 mg/dL   GFR, Est Non Af Am 49 (L) >60 mL/min   GFR, Est AFR Am 57 (L) >60 mL/min    Comment: (NOTE) The eGFR has been calculated using the CKD EPI equation. This calculation has not been validated in all clinical situations. eGFR's persistently <60 mL/min  signify possible Chronic Kidney Disease.    Anion gap 12 (H) 3 - 11    Comment: Performed at [facility], [place] [address] [place], [place] [address]  CBC with Differential ([place] Only)     Status: Abnormal   Collection Time: 01/11/18 12:02 PM  Result Value Ref Range   WBC Count 16.3 (H) 3.9 - 10.3 K/uL   RBC 4.28 3.70 - 5.45 MIL/uL   Hemoglobin 12.7 11.6 - 15.9 g/dL   HCT 37.9 34.8 - 46.6 %   MCV 88.6 79.5 - 101.0 fL   MCH 29.7  25.1 - 34.0 pg   MCHC 33.5 31.5 - 36.0 g/dL   RDW 16.0 (H) 11.2 - 14.5 %   Platelet Count 409 (H) 145 - 400 K/uL   Neutrophils Relative % 79 %   Neutro Abs 12.8 (H) 1.5 - 6.5 K/uL   Lymphocytes Relative 13 %   Lymphs Abs 2.1 0.9 - 3.3 K/uL   Monocytes Relative 8 %   Monocytes Absolute 1.3 (H) 0.1 - 0.9 K/uL   Eosinophils Relative 0 %   Eosinophils Absolute 0.1 0.0 - 0.5 K/uL   Basophils Relative 0 %   Basophils Absolute 0.0 0.0 - 0.1 K/uL    Comment: Performed at [facility], [place] [address], [place], [place] [address]  CBC with Differential/Platelet     Status: Abnormal   Collection Time: 01/12/18 11:47 PM  Result Value Ref Range   WBC 18.5 (H) 4.0 - 10.5 K/uL   RBC 4.34 3.87 - 5.11 MIL/uL   Hemoglobin 13.3 12.0 - 15.0 g/dL   HCT 38.1 36.0 - 46.0 %   MCV 87.8 78.0 - 100.0 fL   MCH 30.6 26.0 - 34.0 pg   MCHC 34.9 30.0 - 36.0 g/dL   RDW 15.6 (H) 11.5 - 15.5 %   Platelets 416 (H) 150 - 400 K/uL   Neutrophils Relative % 78 %   Lymphocytes Relative 12 %   Monocytes Relative 10 %   Eosinophils Relative 0 %   Basophils Relative 0 %   Neutro Abs 14.4 (H) 1.7 - 7.7 K/uL   Lymphs Abs 2.2 0.7 - 4.0 K/uL   Monocytes Absolute 1.9 (H) 0.1 - 1.0 K/uL   Eosinophils Absolute 0.0 0.0 - 0.7 K/uL   Basophils Absolute 0.0 0.0 - 0.1 K/uL   Smear Review MORPHOLOGY UNREMARKABLE     Comment: Performed at [facility], [place] [address] [place], [place] [address]  Comprehensive metabolic  panel     Status: Abnormal   Collection Time: 01/12/18 11:47 PM  Result Value Ref Range   Sodium 135 135 - 145 mmol/L   Potassium 4.0 3.5 - 5.1 mmol/L   Chloride 99 (L) 101 - 111 mmol/L   CO2 22 22 - 32 mmol/L   Glucose, Bld 106 (H) 65 - 99 mg/dL   BUN 44 (H) 6 - 20 mg/dL   Creatinine, Ser 2.58 (H) 0.44 - 1.00 mg/dL   Calcium 10.1 8.9 - 10.3 mg/dL   Total Protein 7.6 6.5 - 8.1 g/dL   Albumin 2.7 (L) 3.5 - 5.0 g/dL   AST 86 (H) 15 - 41 U/L   ALT 38 14 - 54 U/L   Alkaline Phosphatase 322 (H) 38 - 126 U/L   Total Bilirubin 1.0 0.3 - 1.2 mg/dL   GFR calc non Af Amer 19 (L) >60 mL/min   GFR calc Af Amer 22 (L) >60 mL/min    Comment: (NOTE) The eGFR has been calculated using the CKD EPI equation. This calculation has not been validated in all clinical situations. eGFR's persistently <60 mL/min signify possible Chronic Kidney Disease.    Anion gap 14 5 - 15    Comment: Performed at [facility], [place] [address] [place], [place] [address]  CBG monitoring, ED     Status: None   Collection Time: 01/12/18 11:54 PM  Result Value Ref Range   Glucose-Capillary 90 65 - 99 mg/dL   Comment 1 Notify RN   Urinalysis, Routine w reflex microscopic  Status: Abnormal   Collection Time: 01/13/18 12:04 AM  Result Value Ref Range   Color, Urine AMBER (A) YELLOW    Comment: BIOCHEMICALS MAY BE AFFECTED BY COLOR   APPearance CLOUDY (A) CLEAR   Specific Gravity, Urine 1.020 1.005 - 1.030   pH 5.0 5.0 - 8.0   Glucose, UA NEGATIVE NEGATIVE mg/dL   Hgb urine dipstick SMALL (A) NEGATIVE   Bilirubin Urine SMALL (A) NEGATIVE   Ketones, ur 5 (A) NEGATIVE mg/dL   Protein, ur 100 (A) NEGATIVE mg/dL   Nitrite NEGATIVE NEGATIVE   Leukocytes, UA SMALL (A) NEGATIVE   RBC / HPF 6-30 0 - 5 RBC/hpf   WBC, UA TOO NUMEROUS TO COUNT 0 - 5 WBC/hpf   Bacteria, UA RARE (A) NONE SEEN   Squamous Epithelial / LPF 6-30 (A) NONE SEEN   Mucus PRESENT    Hyaline Casts, UA PRESENT    Amorphous  Crystal PRESENT     Comment: Performed at [facility], [place] [address], [place], [place] [address]  Na and K (sodium & potassium), rand urine     Status: None   Collection Time: 01/13/18 12:15 AM  Result Value Ref Range   Sodium, Ur 14 mmol/L   Potassium Urine 59 mmol/L    Comment: Performed at [facility], [place] [address] [place], [place] [address]  Creatinine, urine, random     Status: None   Collection Time: 01/13/18 12:15 AM  Result Value Ref Range   Creatinine, Urine 292.67 mg/dL    Comment: Performed at [facility], [place] [address] [place], [place] [address]  Urine Culture     Status: Abnormal   Collection Time: 01/13/18  2:40 AM  Result Value Ref Range   Specimen Description      URINE, RANDOM Performed at [place] [address] [place], [place] [address]    Special Requests      NONE Performed at [facility], [place] [address], [place], [place] [address]    Culture MULTIPLE SPECIES PRESENT, SUGGEST RECOLLECTION (A)    Report Status 01/14/2018 FINAL   Basic metabolic panel     Status: Abnormal   Collection Time: 01/13/18  4:28 AM  Result Value Ref Range   Sodium 136 135 - 145 mmol/L   Potassium 4.0 3.5 - 5.1 mmol/L   Chloride 100 (L) 101 - 111 mmol/L   CO2 19 (L) 22 - 32 mmol/L   Glucose, Bld 103 (H) 65 - 99 mg/dL   BUN 43 (H) 6 - 20 mg/dL   Creatinine, Ser 2.42 (H) 0.44 - 1.00 mg/dL   Calcium 9.7 8.9 - 10.3 mg/dL   GFR calc non Af Amer 20 (L) >60 mL/min   GFR calc Af Amer 23 (L) >60 mL/min    Comment: (NOTE) The eGFR has been calculated using the CKD EPI equation. This calculation has not been validated in all clinical situations. eGFR's persistently <60 mL/min signify possible Chronic Kidney Disease.    Anion gap 17 (H) 5 - 15    Comment: Performed at [facility], [place] [address] [place], [place] [address]  HIV antibody (Routine Testing)      Status: None   Collection Time: 01/13/18  4:28 AM  Result Value Ref Range   HIV Screen 4th Generation wRfx Non Reactive Non Reactive    Comment: (NOTE) Performed At: [facility] [address] [place], [place] [id number] Rush Farmer MD [id number]  Performed at [facility], [place] [address] [place], [place] [address]   CK     Status: None   Collection Time: 01/13/18  4:28 AM  Result Value Ref Range   Total CK 50 38 - 234 U/L    Comment: Performed at [facility], [place] [address] [place], [place] [address]  CBC with Differential/Platelet     Status: Abnormal   Collection Time: 01/14/18  4:13 AM  Result Value Ref Range   WBC 16.7 (H) 4.0 - 10.5 K/uL   RBC 3.99 3.87 - 5.11 MIL/uL   Hemoglobin 11.7 (L) 12.0 - 15.0 g/dL   HCT 34.9 (L) 36.0 - 46.0 %   MCV 87.5 78.0 - 100.0 fL   MCH 29.3 26.0 - 34.0 pg   MCHC 33.5 30.0 - 36.0 g/dL   RDW 15.8 (H) 11.5 - 15.5 %   Platelets 372 150 - 400 K/uL   Neutrophils Relative % 72 %   Lymphocytes Relative 13 %   Monocytes Relative 14 %   Eosinophils Relative 1 %   Basophils Relative 0 %   Neutro Abs 12.0 (H) 1.7 - 7.7 K/uL   Lymphs Abs 2.2 0.7 - 4.0 K/uL   Monocytes Absolute 2.3 (H) 0.1 - 1.0 K/uL   Eosinophils Absolute 0.2 0.0 - 0.7 K/uL   Basophils Absolute 0.0 0.0 - 0.1 K/uL   Smear Review MORPHOLOGY UNREMARKABLE     Comment: Performed at [facility], [place] [address] [place], [place] [address]  Basic metabolic panel     Status: Abnormal   Collection Time: 01/14/18  4:13 AM  Result Value Ref Range   Sodium 134 (L) 135 - 145 mmol/L   Potassium 3.4 (L) 3.5 - 5.1 mmol/L   Chloride 103 101 - 111 mmol/L   CO2 18 (L) 22 - 32 mmol/L   Glucose, Bld 102 (H) 65 - 99 mg/dL   BUN 44 (H) 6 - 20 mg/dL   Creatinine, Ser 1.69 (H) 0.44 - 1.00 mg/dL   Calcium 9.8 8.9 - 10.3 mg/dL   GFR calc non Af Amer 31 (L) >60 mL/min   GFR calc Af Amer 36 (L) >60 mL/min    Comment: (NOTE) The  eGFR has been calculated using the CKD EPI equation. This calculation has not been validated in all clinical situations. eGFR's persistently <60 mL/min signify possible Chronic Kidney Disease.    Anion gap 13 5 - 15    Comment: Performed at [facility] [place] [address] [place], [place] [address]  CBC w/Diff     Status: Abnormal   Collection Time: 01/19/18 11:07 AM  Result Value Ref Range   WBC 16.2 (H) 4.0 - 10.5 K/uL   RBC 4.06 3.87 - 5.11 Mil/uL   Hemoglobin 11.6 (L) 12.0 - 15.0 g/dL   HCT 36.4 36.0 - 46.0 %   MCV 89.5 78.0 - 100.0 fl   MCHC 31.9 30.0 - 36.0 g/dL   RDW 15.6 (H) 11.5 - 15.5 %   Platelets 441.0 (H) 150.0 - 400.0 K/uL   Neutrophils Relative % 78.3 (H) 43.0 - 77.0 %   Lymphocytes Relative 11.6 (L) 12.0 - 46.0 %   Monocytes Relative 7.4 3.0 - 12.0 %   Eosinophils Relative 1.9 0.0 - 5.0 %   Basophils Relative 0.8 0.0 - 3.0 %   Neutro Abs 12.7 (H) 1.4 - 7.7 K/uL   Lymphs Abs 1.9 0.7 - 4.0 K/uL   Monocytes Absolute 1.2 (  H) 0.1 - 1.0 K/uL   Eosinophils Absolute 0.3 0.0 - 0.7 K/uL   Basophils Absolute 0.1 0.0 - 0.1 K/uL  Basic Metabolic Panel (BMET)     Status: Abnormal   Collection Time: 01/19/18 11:07 AM  Result Value Ref Range   Sodium 135 135 - 145 mEq/L   Potassium 4.2 3.5 - 5.1 mEq/L   Chloride 101 96 - 112 mEq/L   CO2 25 19 - 32 mEq/L   Glucose, Bld 108 (H) 70 - 99 mg/dL   BUN 13 6 - 23 mg/dL   Creatinine, Ser 0.73 0.40 - 1.20 mg/dL   Calcium 10.0 8.4 - 10.5 mg/dL   GFR 85.40 >60.00 mL/min  TSH     Status: Abnormal   Collection Time: 01/19/18 11:07 AM  Result Value Ref Range   TSH 9.84 (H) 0.35 - 4.50 uIU/mL  Basic metabolic panel     Status: Abnormal   Collection Time: 01/21/18  9:27 PM  Result Value Ref Range   Sodium 134 (L) 135 - 145 mmol/L   Potassium 4.1 3.5 - 5.1 mmol/L   Chloride 101 101 - 111 mmol/L   CO2 19 (L) 22 - 32 mmol/L   Glucose, Bld 93 65 - 99 mg/dL   BUN 14 6 - 20 mg/dL   Creatinine, Ser 0.93 0.44 - 1.00 mg/dL    Calcium 9.4 8.9 - 10.3 mg/dL   GFR calc non Af Amer >60 >60 mL/min   GFR calc Af Amer >60 >60 mL/min    Comment: (NOTE) The eGFR has been calculated using the CKD EPI equation. This calculation has not been validated in all clinical situations. eGFR's persistently <60 mL/min signify possible Chronic Kidney Disease.    Anion gap 14 5 - 15    Comment: Performed at [facility], [place] [place], [place] [address]  CBC with Differential     Status: Abnormal   Collection Time: 01/21/18  9:27 PM  Result Value Ref Range   WBC 16.7 (H) 4.0 - 10.5 K/uL   RBC 4.08 3.87 - 5.11 MIL/uL   Hemoglobin 11.8 (L) 12.0 - 15.0 g/dL   HCT 34.6 (L) 36.0 - 46.0 %   MCV 84.8 78.0 - 100.0 fL   MCH 28.9 26.0 - 34.0 pg   MCHC 34.1 30.0 - 36.0 g/dL   RDW 16.1 (H) 11.5 - 15.5 %   Platelets 453 (H) 150 - 400 K/uL   Neutrophils Relative % 78 %   Lymphocytes Relative 12 %   Monocytes Relative 9 %   Eosinophils Relative 1 %   Basophils Relative 0 %   Neutro Abs 13.0 (H) 1.7 - 7.7 K/uL   Lymphs Abs 2.0 0.7 - 4.0 K/uL   Monocytes Absolute 1.5 (H) 0.1 - 1.0 K/uL   Eosinophils Absolute 0.2 0.0 - 0.7 K/uL   Basophils Absolute 0.0 0.0 - 0.1 K/uL   WBC Morphology TOXIC GRANULATION     Comment: Performed at [facility], [place] [place], [place] [address]  Wound or Superficial Culture     Status: None   Collection Time: 01/21/18  9:27 PM  Result Value Ref Range   Specimen Description      ABSCESS Performed at [facility], [place] [place], [place] [address]    Special Requests      Normal Performed at [facility], [address] [place], [place] [address]    Gram  Stain      FEW WBC PRESENT, PREDOMINANTLY PMN RARE GRAM POSITIVE COCCI IN PAIRS Performed at [facility], [place] [address], [place], [place] [address]    Culture FEW STAPHYLOCOCCUS AUREUS    Report Status 01/24/2018 FINAL    Organism ID, Bacteria STAPHYLOCOCCUS AUREUS         Susceptibility   Staphylococcus aureus - MIC*    CIPROFLOXACIN >=8 RESISTANT Resistant     ERYTHROMYCIN >=8 RESISTANT Resistant     GENTAMICIN <=0.5 SENSITIVE Sensitive     OXACILLIN 0.5 SENSITIVE Sensitive     TETRACYCLINE <=1 SENSITIVE Sensitive     VANCOMYCIN <=0.5 SENSITIVE Sensitive     TRIMETH/SULFA <=10 SENSITIVE Sensitive     CLINDAMYCIN <=0.25 SENSITIVE Sensitive     RIFAMPIN <=0.5 SENSITIVE Sensitive     Inducible Clindamycin NEGATIVE Sensitive     * FEW STAPHYLOCOCCUS AUREUS    US Renal  Result Date: 01/13/2018 CLINICAL DATA:  Acute kidney injury. EXAM: RENAL / URINARY TRACT ULTRASOUND COMPLETE COMPARISON:  Abdomen and pelvis CT dated 01/10/2016. FINDINGS: Right Kidney: Length: 12.4 cm. Echogenicity within normal limits. No mass or hydronephrosis visualized. Left Kidney: Length: 12.0 cm. Echogenicity within normal limits. No mass or hydronephrosis visualized. Bladder: Appears normal for degree of bladder distention. IMPRESSION: Normal examination. Electronically Signed   By: Claudie Revering M.D.   On: 01/13/2018 10:16     ROS: 14 pt review of systems performed and negative (unless mentioned in an HPI)  Objective: BP 104/73 (BP Location: Right Arm, Patient Position: Sitting, Cuff Size: Large)   Pulse 74   Temp (!) 97.3 F (36.3 C) (Oral)   Ht 5' 5"  (1.651 m)   Wt 282 lb 12.8 oz (128.3 kg)   LMP 02/15/2013   SpO2 93%   BMI 47.06 kg/m   Gen: Afebrile. No acute distress.  In wheelchair.  Morbidly obese. Skin: Chronic lymphedema left upper extremity.  Area of erythema completely resolved.  Very small I&D opening with serosanguineous drainage.  X3 small areas with closed comedo-like appearance. Neuro: In a wheelchair. PERLA. EOMi. Alert. Oriented x3     Assessment/plan: Kimberly Reed is a 62 y.o. female present for follow-up on cellulitis and leukocytosis Hypothyroidism, unspecified type -After review of records patient has been noncompliant with Synthroid  medications.  She has been referred to Dr. Buddy Duty endocrinology, she states she has an appointment set up with him April 23.  Strongly advised her to take her Synthroid as prescribed, the 150 mcg dose daily on an empty stomach.  Not taking thyroid medication as prescribed can cause weakness.  elevated liver enzymes/hyperlipidemia/breast cancer -Discussed the trend of her liver enzymes with her today.  Strongly advised her to set up the CT she canceled for abdominal/pelvis CT.  She states she will set back up the appointment, she had canceled secondary to the left elbow cellulitis, which is much improved and she thinks she can lay down flat for CT now. -Review of records elevated liver enzymes is a new condition for her as of October 2018.  They continue to be elevated and rise.  This is concerning given her breast cancer history.  Negative chronic hepatitis panel was completed negative HIV was completed in recent hospitalization. -CT abdominal pelvis was ordered by oncology, however patient could not have completed secondary to weakness and illness in the outpatient setting.  She should have imaging of her abdomen pelvis/liver.   Lymphedema of left upper extremity/swelling/Cellulitis of left elbow -Cellulitis of left  elbow is improving.  Continue doxycycline until completed.  Continue daily dressing changes with Bactroban cream which was provided today. - US Venous Img Upper Uni Left; Future--> no DVT.  -After infection of elbow is improved, would strongly encourage her to start having physical therapy sessions routinely for with her lymphedema.  She is already established with a lymphedema specialist in [facility]. -Strongly encourage patient to wear her lymphedema sleeve provided to her by her oncologist's elbow stops draining. -Patient is in agreement and states she will wear the sleeve and start PT as soon as infection is resolved.  Weakness of both legs/dizziness -Weakness and dizziness of unknown  cause as progressed over the last 2 weeks, patient is now wheelchair-bound for unknown reason. -Her neurological history is not clear.  At one time she was seen Dr. Jannifer Franklin, now appears to have seen Dr. Jerrel Ivory for sleep studies she was encouraged to have completed.  Risk for sleep apnea and could be because of potential fatigue and weakness. -Patient is noncompliant with her Synthroid.  After records received from prior PCP patient has opted to not take Synthroid for some time, and then recently told to restart. -Patient reports she has been on 150 mg of levothyroxine daily for 4 weeks prior to TSH testing our last appointment, which came back still elevated at approximately 9-10.  Advised her to make certain she is taking her thyroid medication daily, follow-up with Dr. Buddy Duty at her scheduled appointment in a few weeks.  Untreated hypothyroidism can certainly cause fatigue and muscle weakness. - vestibular rehab and HH seem to have been placed by neuro and ONC within last few weeks.  - Ambulatory referral to Neurology was placed after last visit. -Approved a handicap sticker for her today for temporary 73-monthonly.  Patient should be encouraged to figure out why her weakness is present and treat.  Neurological evaluation for wheelchair need/because of weakness before renewing her handicap sticker.  > 25 minutes spent with patient, >50% of time spent face to face counseling and coordinating care.    Return in about 1 week (around 01/31/2018) for Lab appointment.  Note is dictated utilizing voice recognition software. Although note has been proof read prior to signing, occasional typographical errors still can be missed. If any questions arise, please do not hesitate to call for verification.  Electronically signed by: RHoward Pouch DO [place]

## 2018-01-24 NOTE — Patient Instructions (Addendum)
We need you to get the imaging of your abdomen/pelvis to check your liver. Your oncology doctor has ordered, you just need to get it rescheduled.   I have ordered some cream for you to put over the elbow also. This will help from furthering infection.   I want to collect your White count in 1 week by lab appt only. We need to make sure it goes down as elbow improves. If it does not we will have to  Look into why your WBC is elevating.   Please make sure to take your levothyroxine 150 mcg a day on an empty stomach.   I had placed a referral to neurology last visit. The cause of your weakness needs to be identified... Could be thyroid related since tsh climbing.   Try to hang in with the doxycyline use, take with food, since it is working.   Once we get your labs in we will discuss follow up.

## 2018-01-25 ENCOUNTER — Telehealth: Payer: Self-pay | Admitting: *Deleted

## 2018-01-25 ENCOUNTER — Encounter: Payer: Self-pay | Admitting: *Deleted

## 2018-01-25 ENCOUNTER — Encounter: Payer: Self-pay | Admitting: Family Medicine

## 2018-01-25 NOTE — Telephone Encounter (Signed)
Post ED Visit - Positive Culture Follow-up  Culture report reviewed by antimicrobial stewardship pharmacist:  []  Elenor Quinones, Pharm.D. []  Heide Guile, Pharm.D., BCPS AQ-ID []  Parks Neptune, Pharm.D., BCPS []  Alycia Rossetti, Pharm.D., BCPS []  [place], Pharm.D., BCPS, AAHIVP [x]  Legrand Como, Pharm.D., BCPS, AAHIVP []  Salome Arnt, PharmD, BCPS []  Jalene Mullet, PharmD []  Vincenza Hews, PharmD, BCPS  Positive wound culture Treated with Doxycylcline on 01/19/2018, organism sensitive to the same and no further patient follow-up is required at this time.  Harlon Flor [facility] 01/25/2018, 1:32 PM

## 2018-01-26 ENCOUNTER — Telehealth: Payer: Self-pay | Admitting: *Deleted

## 2018-01-26 NOTE — Telephone Encounter (Signed)
Faxed ROI to [facility] 01/26/18 @ 12:02pm; release id # [id number]

## 2018-01-27 ENCOUNTER — Encounter: Payer: Self-pay | Admitting: General Practice

## 2018-01-27 ENCOUNTER — Telehealth: Payer: Self-pay | Admitting: Hematology and Oncology

## 2018-01-27 NOTE — Progress Notes (Signed)
[place] Spiritual Care Note  Reached [name] by phone for a >35-min conversation about how she is coping with challenges. In the midst of fatigue, cellulitis, and other health challenges, Kimberly Reed focused not on complaint but on all the ways that she is practicing self-advocacy and creative problem-solving to get the help and support she needs as she manages many appts, disability paperwork, and other needs. Affirmed and emphasized these strengths. She is very motivated to return to work as soon as her health allows.  Following for support and plan to keep in touch by phone, but please also page if needs arise or circumstances change. Thank you.   [place], [place], [facility] Pager [phone] Voicemail [phone]

## 2018-01-27 NOTE — Telephone Encounter (Signed)
01/27/2018 @10 :00 am, spoke with patient and informed Disability forms have been successfully faxed to [facility] at [phone]. Copy mailed per patient request.

## 2018-01-28 ENCOUNTER — Telehealth: Payer: Self-pay | Admitting: *Deleted

## 2018-01-28 ENCOUNTER — Encounter: Payer: Self-pay | Admitting: Cardiovascular Disease

## 2018-01-28 ENCOUNTER — Ambulatory Visit: Payer: BLUE CROSS/BLUE SHIELD | Admitting: Cardiovascular Disease

## 2018-01-28 DIAGNOSIS — R0602 Shortness of breath: Secondary | ICD-10-CM | POA: Insufficient documentation

## 2018-01-28 DIAGNOSIS — I890 Lymphedema, not elsewhere classified: Secondary | ICD-10-CM

## 2018-01-28 NOTE — Progress Notes (Signed)
01/28/2018 [name]   [birth year]  [id number]  Primary Physician Ma Hillock, DO Primary Cardiologist: Lorretta Harp MD Lupe Carney, [place]  HPI:  Kimberly Reed is a 62 y.o.  moderately overweight , single Caucasian female with no children who is self-referred. She works as a Radiation protection practitioner for [employer]. I last saw her in the office 03/22/14. She is seeing me because of family history of heart disease and significant dyspnea on exertion Her  Risk factors are only notable for family history with a mother who had myocardial infarctions and died at age 14 and a younger brother who had a sudden aortic dissection age 41. She has never had a heart attack or stroke. Since I saw her 4 years ago she had developed breast cancer in her left breast and had a lumpectomy with chemotherapy and radiation therapy as well. She's noticed increasing dyspnea on exertion and is for all intensive purposes wheelchair-bound because of this, generalized fatigue and weakness as well as weakness in her legs. She does have lymphedema in her left upper extremity as well.   Current Meds  Medication Sig  . acetaminophen (TYLENOL) 325 MG tablet Take 2 tablets (650 mg total) by mouth every 6 (six) hours as needed for mild pain or moderate pain (or Fever >/= 101).  . brimonidine-timolol (COMBIGAN) 0.2-0.5 % ophthalmic solution Place 1 drop into the right eye every 12 (twelve) hours.   . dorzolamide (TRUSOPT) 2 % ophthalmic solution Place 1 drop into the right eye 2 (two) times daily.   [name] doxycycline (VIBRA-TABS) 100 MG tablet Take 1 tablet (100 mg total) by mouth 2 (two) times daily.  [name] levothyroxine (SYNTHROID, LEVOTHROID) 150 MCG tablet TAKE 1 TABLET BY MOUTH EVERY DAY ON EMPTY STOMACH IN THE MORNING  . mupirocin cream (BACTROBAN) 2 % Apply 1 application topically 2 (two) times daily.  . Omega-3 Fatty Acids (FISH OIL) 1000 MG CAPS Take 1,000 mg by mouth daily.  . Travoprost, BAK Free,  (TRAVATAN) 0.004 % SOLN ophthalmic solution Place 1 drop into the right eye at bedtime.      Allergies  Allergen Reactions  . Other Nausea Only and Other (See Comments)    Antibiotics - Pt cannot identify which antibiotics she reacts to.  Make her nausous and gives her headache.  . Effexor [Venlafaxine] Nausea And Vomiting  . Mucinex [Guaifenesin Er] Nausea Only    Social History   Socioeconomic History  . Marital status: Single    Spouse name: Not on file  . Number of children: 0  . Years of education: Some college  . Highest education level: Not on file  Occupational History  . 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 Financial resource strain: Not on file  . Food insecurity:    Worry: Not on file    Inability: Not on file  . Transportation needs:    Medical: Not on file    Non-medical: Not on file  Tobacco Use  . Smoking status: Never Smoker  . Smokeless tobacco: Never Used  Substance and Sexual Activity  . Alcohol use: No  . Drug use: No    Comment: in 20's but not now  . Sexual activity: Not Currently    Comment: menarche age 49, P 0, no HRT  Lifestyle  . Physical activity:    Days per week: Not on file  Minutes per session: Not on file  . Stress: Not on file  Relationships  . Social connections:    Talks on phone: Not on file    Gets together: Not on file    Attends religious service: Not on file    Active member of club or organization: Not on file    Attends meetings of clubs or organizations: Not on file    Relationship status: Not on file  . Intimate partner violence:    Fear of current or ex partner: Not on file    Emotionally abused: Not on file    Physically abused: Not on file    Forced sexual activity: Not on file  Other Topics Concern  . Not on file  Social History Narrative   Single.  College.  Lives alone.  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    Right-handed   Dairy products, takes a daily vitamin, drinks caffeine, uses herbal remedies.   Alarm at home.  Wears her seatbelt.        Review of Systems: General: negative for chills, fever, night sweats or weight changes.  Cardiovascular: negative for chest pain, dyspnea on exertion, edema, orthopnea, palpitations, paroxysmal nocturnal dyspnea or shortness of breath Dermatological: negative for rash Respiratory: negative for cough or wheezing Urologic: negative for hematuria Abdominal: negative for nausea, vomiting, diarrhea, bright red blood per rectum, melena, or hematemesis Neurologic: negative for visual changes, syncope, or dizziness All other systems reviewed and are otherwise negative except as noted above.    Blood pressure 134/83, pulse 79, height 5\' 5"  (1.651 m), weight 176 lb (79.8 kg), last menstrual period 02/15/2013.  General appearance: alert and no distress Neck: no adenopathy, no carotid bruit, no JVD, supple, symmetrical, trachea midline and thyroid not enlarged, symmetric, no tenderness/mass/nodules Lungs: clear to auscultation bilaterally Heart: regular rate and rhythm, S1, S2 normal, no murmur, click, rub or gallop Extremities: lymphedema left upper extremity Pulses: 2+ and symmetric Skin: Skin color, texture, turgor normal. No rashes or lesions Neurologic: Alert and oriented X 3, normal strength and tone. Normal symmetric reflexes. Normal coordination and gait  EKG sinus rhythm at 79 without ST or T-wave changes. There were septal Q waves and minimal criteria for LVH. I personally reviewed this EKG.  ASSESSMENT AND PLAN:   Shortness of breath History of significant shortness of breath out of proportion to the activity. She did have a normal 2-D echo which I performed 06/21/14. She has had breast cancer, chemotherapy and radiation therapy for this as well as lumpectomy. I am going to get a 2-D echocardiogram and pharmacologic Myoview stress test given her  family history to rule out an ischemic etiology.      Lorretta Harp MD FACP,FACC,FAHA, [facility] 01/28/2018 2:41 PM

## 2018-01-28 NOTE — Assessment & Plan Note (Signed)
History of significant shortness of breath out of proportion to the activity. She did have a normal 2-D echo which I performed 06/21/14. She has had breast cancer, chemotherapy and radiation therapy for this as well as lumpectomy. I am going to get a 2-D echocardiogram and pharmacologic Myoview stress test given her family history to rule out an ischemic etiology.

## 2018-01-28 NOTE — Patient Instructions (Signed)
Medication Instructions: Your physician recommends that you continue on your current medications as directed. Please refer to the Current Medication list given to you today.   Testing/Procedures: Your physician has requested that you have a lexiscan myoview. For further information please visit [email or web address]. Please follow instruction sheet, as given.  Your physician has requested that you have an echocardiogram. Echocardiography is a painless test that uses sound waves to create images of your heart. It provides your doctor with information about the size and shape of your heart and how well your heart's chambers and valves are working. This procedure takes approximately one hour. There are no restrictions for this procedure.  (Clearance for Physical Therapy)  Follow-Up: Your physician recommends that you schedule a follow-up appointment as needed unless abnormal results.

## 2018-01-28 NOTE — Telephone Encounter (Signed)
Spoke with patient she has lab appt for Monday she is requesting a referral for PT at [facility] physical therapy for her lymph edema she has schedule appt for Wednesday and Friday but needs an order. Please advise.

## 2018-01-31 ENCOUNTER — Other Ambulatory Visit: Payer: BLUE CROSS/BLUE SHIELD

## 2018-01-31 ENCOUNTER — Emergency Department (HOSPITAL_BASED_OUTPATIENT_CLINIC_OR_DEPARTMENT_OTHER)
Admission: EM | Admit: 2018-01-31 | Discharge: 2018-01-31 | Disposition: A | Discharge status: Home / Self Care | Payer: BLUE CROSS/BLUE SHIELD | Attending: Emergency Medicine | Admitting: Emergency Medicine

## 2018-01-31 ENCOUNTER — Ambulatory Visit: Payer: Self-pay | Admitting: *Deleted

## 2018-01-31 ENCOUNTER — Other Ambulatory Visit: Payer: Self-pay

## 2018-01-31 ENCOUNTER — Ambulatory Visit: Payer: Self-pay

## 2018-01-31 ENCOUNTER — Ambulatory Visit: Payer: BLUE CROSS/BLUE SHIELD | Admitting: Family Medicine

## 2018-01-31 ENCOUNTER — Telehealth: Payer: Self-pay

## 2018-01-31 ENCOUNTER — Encounter (HOSPITAL_BASED_OUTPATIENT_CLINIC_OR_DEPARTMENT_OTHER): Payer: Self-pay | Admitting: *Deleted

## 2018-01-31 DIAGNOSIS — Y829 Unspecified medical devices associated with adverse incidents: Secondary | ICD-10-CM | POA: Insufficient documentation

## 2018-01-31 DIAGNOSIS — L7633 Postprocedural seroma of skin and subcutaneous tissue following a dermatologic procedure: Secondary | ICD-10-CM | POA: Insufficient documentation

## 2018-01-31 DIAGNOSIS — E039 Hypothyroidism, unspecified: Secondary | ICD-10-CM | POA: Diagnosis not present

## 2018-01-31 DIAGNOSIS — T8189XA Other complications of procedures, not elsewhere classified, initial encounter: Secondary | ICD-10-CM | POA: Diagnosis present

## 2018-01-31 DIAGNOSIS — F329 Major depressive disorder, single episode, unspecified: Secondary | ICD-10-CM | POA: Diagnosis not present

## 2018-01-31 DIAGNOSIS — Z853 Personal history of malignant neoplasm of breast: Secondary | ICD-10-CM | POA: Insufficient documentation

## 2018-01-31 DIAGNOSIS — F419 Anxiety disorder, unspecified: Secondary | ICD-10-CM | POA: Diagnosis not present

## 2018-01-31 DIAGNOSIS — Z79899 Other long term (current) drug therapy: Secondary | ICD-10-CM | POA: Diagnosis not present

## 2018-01-31 DIAGNOSIS — I890 Lymphedema, not elsewhere classified: Secondary | ICD-10-CM | POA: Insufficient documentation

## 2018-01-31 MED ORDER — LIDOCAINE HCL (PF) 1 % IJ SOLN
5.0000 mL | Freq: Once | INTRAMUSCULAR | Status: AC
  Administered 2018-01-31: 5 mL
  Filled 2018-01-31: qty 5

## 2018-01-31 NOTE — Telephone Encounter (Signed)
Pt called stating she's having an emergency and has questioned to keep or cancel HST appt for this Wednesday 02/02/18.  I informed pt that we will keep appt for now and will call back Wednesday am to confirm with pt.  Pt has been warned prior to this visit about our cancellation/rescheduling policy. We've been trying to perform HST since Sept 2018.  When 4/10 appt was scheduled pt was told that if she cancelled this appointment, we would be unable to reschedule again, due to 3 time policy.  At this phone call, when pt was told that if she cancels this 4/10 appt(today or Wednesday) we would be unable to reschedule and MD would have the discretion if she's able to continue with [place] in lab. Pt got very huffy and rude and hung up on me.

## 2018-01-31 NOTE — Telephone Encounter (Signed)
noted 

## 2018-01-31 NOTE — ED Triage Notes (Signed)
Hx of lymphedema and cellulitis of her left elbow. She just finished a 10 day antibiotic tx. She noticed this am that she has a second wound on her skin that is draining.

## 2018-01-31 NOTE — Telephone Encounter (Signed)
Pt. Reports she finished her antibiotic Saturday, but her left elbow is draining again. Started last Thursday - color is clear with some blood-tinged noted. States she "just doesn't feel good." Has a lab appointment today, so request an appointment for tomorrow. Appointment made.  Reason for Disposition . [1] Finished taking antibiotic AND [2] cellulitis symptoms are WORSE (e.g., redness, pain  drainage, swelling)  Answer Assessment - Initial Assessment Questions 1. SYMPTOM: "What's the main symptom you're concerned about?" (e.g., redness, swelling, pain, fever, weakness)     Drainage from left elbow 2. CELLULITIS LOCATION: "Where is the cellulitis  located?" (e.g., hand, arm, foot, leg, face)     Left elbow 3. CELLULITIS  SIZE: "What is the size of the red area?" (e.g., inches, centimeters; compare to size of a coin) .     Small amount of redness 4. BETTER-SAME-WORSE: "Are you getting better, staying the same, or getting worse compared to the day you started the antibiotics?"  5.  PAIN: Do you have any pain?"  If so, "How bad is the pain?"  (e.g., Scale 1-10; mild, moderate, or severe)    - MILD (1-3): doesn't interfere with normal activities     - MODERATE (4-7): interferes with normal activities or awakens from sleep    - SEVERE (8-10): excruciating pain, unable to do any normal activities       Feels worse - 1-2 6.  FEVER: "Do you have a fever?" If so, ask: "What is it, how was it measured and when did it start?"     No 7. OTHER SYMPTOMS: "Do you have any other symptoms?" (e.g., pus coming from a wound, red streaks, weakness) 8.  DIAGNOSIS DATE: "When was the cellulitis diagnosed?" "By whom?"  9.  ANTIBIOTIC NAME: "What antibiotic(s) are you taking?"  "How many times per day?" (Be sure the patient is receiving the antibiotic as directed).      Finished Doxycycline 10. ANTIBIOTIC DATE: "When was the antibiotic started?"       Finished it Saturday 11. FOLLOW-UP APPOINTMENT: "Do you have  follow-up appointment with your doctor?"       No  Protocols used: CELLULITIS ON ANTIBIOTIC FOLLOW-UP CALL-A-AH

## 2018-01-31 NOTE — Telephone Encounter (Signed)
Pt canceled CT chest and Md follow up visit with Dr. Lindi Adie due to emergency with her arm. She will call to reschedule. Cyndia Bent RN

## 2018-01-31 NOTE — Telephone Encounter (Signed)
Pt reports "new area opened up above elbow and is draining. States large knot present; "This is a new area." States she is going to ED, "Just wanted Dr. Raoul Pitch to know." NT will cancel appointments for today(lab) and tomorrow ; pt will call back to reschedule.

## 2018-01-31 NOTE — Telephone Encounter (Signed)
PT Order placed

## 2018-01-31 NOTE — ED Provider Notes (Signed)
[facility] EMERGENCY DEPARTMENT Provider Note   CSN: [id number] Arrival date & time: 01/31/18  1348     History   Chief Complaint Chief Complaint  Patient presents with  . Wound Infection    HPI Kimberly Reed is a 62 y.o. female.  HPI   62 year old female presenting for a wound check.  She has severe chronic lymphedema of her left upper extremity.  She was in the emergency room over a week ago and had incision and drainage of an abscess to her left elbow.  She completed a course of antibiotics.  She is concerned that she is having persistent drainage from the wound.  No fevers or chills.  No new pain.  She reports the pain is actually improved since she was previously evaluated.  Past Medical History:  Diagnosis Date  . Anemia   . Anxiety   . Atypical chest pain   . Breast cancer ([place]) 05/2014   left. completed chemo 10/2014. Did not tolerate anti-estrogen.   . Cancer ([place])    left breast dx. 6'75- chemo planned to start 07-09-14- Dr. Lindi Adie[facility]  . Depression   . Family history of heart disease   . Glaucoma   . Gluten intolerance    Dr. Collene Mares  . Headache syndrome 10/13/2016  . History of syncope 2008   loss of bladder control eval by Neuro  . Hot flashes   . Hyperlipemia   . Hypothyroid   . Insomnia   . Lymphedema    L arm  . Migraine without aura   . Personal history of chemotherapy   . Personal history of radiation therapy   . Shingles 2018  . Wears glasses     Patient Active Problem List   Diagnosis Date Noted  . Lymphedema of arm 01/31/2018  . Shortness of breath 01/28/2018  . Weakness of both legs 01/21/2018  . Cellulitis of left elbow 01/21/2018  . Leukocytosis 01/21/2018  . Elevated liver enzymes 01/21/2018  . Lymphedema of left upper extremity 01/13/2018  . Glaucoma 01/23/2015  . Breast cancer of upper-outer quadrant of left female breast ([place]) 05/28/2014  . Hyperlipidemia 07/24/2008  . Morbid obesity ([place]) 07/24/2008    . Hypothyroidism 11/29/2007  . GLUCOMA 11/29/2007    Past Surgical History:  Procedure Laterality Date  . BREAST BIOPSY Left 06/07/2014   malignant  . BREAST BIOPSY Left 05/24/2014   malignant  . BREAST LUMPECTOMY Left 01/30/2015   malignant  . BREAST LUMPECTOMY WITH NEEDLE LOCALIZATION AND AXILLARY LYMPH NODE DISSECTION Left 01/30/2015   Procedure: BREAST LUMPECTOMY WITH NEEDLE LOCALIZATION LEFT AXILLARY DISSECTION REMOVAL OF PORT;  Surgeon: [name], MD;  Location: [facility];  Service: General;  Laterality: Left;  . CATARACT EXTRACTION Left   . DILATION AND CURETTAGE OF UTERUS    . ESOPHAGOGASTRODUODENOSCOPY  06/01/14  . EXAMINATION UNDER ANESTHESIA  02/24/2013   Procedure: EXAM UNDER ANESTHESIA;  Surgeon: Azalia Bilis, MD;  Location: [facility];  Service: Gynecology;;  with removal of prolapsing cervical mass; pap smear and endometrial biopsy  . GLAUCOMA SURGERY Left    x1   . PORTACATH PLACEMENT Right 07/06/2014   Procedure: PORT A CATH  PLACEMENT;  Surgeon: [name], MD;  Location: [facility];  Service: General;  Laterality: Right;  . TONSILLECTOMY     child  . uterine polyp removed     per hysteroscopy about 1 1/2 yrs ago.     OB History    [place]  0   Para  0   Term  0   Preterm  0   AB  0   Living  0     SAB  0   TAB  0   Ectopic  0   Multiple  0   Live Births  0            Home Medications    Prior to Admission medications   Medication Sig Start Date End Date Taking? Authorizing Provider  acetaminophen (TYLENOL) 325 MG tablet Take 2 tablets (650 mg total) by mouth every 6 (six) hours as needed for mild pain or moderate pain (or Fever >/= 101). 01/14/18   Adhikari, Amrit, MD  brimonidine-timolol (COMBIGAN) 0.2-0.5 % ophthalmic solution Place 1 drop into the right eye every 12 (twelve) hours.     [provider]  dorzolamide (TRUSOPT) 2 % ophthalmic solution Place 1 drop into the right eye 2 (two) times daily.      [provider]  doxycycline (VIBRA-TABS) 100 MG tablet Take 1 tablet (100 mg total) by mouth 2 (two) times daily. 01/19/18   Kuneff, Renee A, DO  levothyroxine (SYNTHROID, LEVOTHROID) 150 MCG tablet TAKE 1 TABLET BY MOUTH EVERY DAY ON EMPTY STOMACH IN THE MORNING 12/29/17   [provider]  mupirocin cream (BACTROBAN) 2 % Apply 1 application topically 2 (two) times daily. 01/24/18   Kuneff, Renee A, DO  Omega-3 Fatty Acids (FISH OIL) 1000 MG CAPS Take 1,000 mg by mouth daily.    [provider]  Travoprost, BAK Free, (TRAVATAN) 0.004 % SOLN ophthalmic solution Place 1 drop into the right eye at bedtime.     [provider]    Family History Family History  Problem Relation Age of Onset  . Kidney disease Mother   . Depression Mother   . Early death Mother   . Hypertension Mother   . Heart attack Mother   . Aortic aneurysm Mother   . Hypertension Father   . Dementia Father   . Depression Father   . Kidney disease Brother   . Hypertension Sister   . Heart disease Unknown        "all of moms side"  . Depression Unknown   . Heart failure Cousin     Social History Social History   Tobacco Use  . Smoking status: Never Smoker  . Smokeless tobacco: Never Used  Substance Use Topics  . Alcohol use: No  . Drug use: No    Comment: in 20's but not now     Allergies   Other; Effexor [venlafaxine]; and Mucinex [guaifenesin er]   Review of Systems Review of Systems  All systems reviewed and negative, other than as noted in HPI.  Physical Exam Updated Vital Signs BP 107/66 (BP Location: Right Arm)   Pulse 70   Temp 98.1 F (36.7 C) (Oral)   Resp 18   Ht 5\' 5"  (1.651 m)   Wt 79.8 kg (176 lb)   LMP 02/15/2013   SpO2 96%   BMI 29.29 kg/m   Physical Exam  Constitutional: She appears well-developed and well-nourished. No distress.  HENT:  Head: Normocephalic and atraumatic.  Eyes: Conjunctivae are normal. Right eye exhibits no discharge.  Left eye exhibits no discharge.  Neck: Neck supple.  Cardiovascular: Normal rate, regular rhythm and normal heart sounds. Exam reveals no gallop and no friction rub.  No murmur heard. Pulmonary/Chest: Effort normal and breath sounds normal. No respiratory distress.  Abdominal:  Soft. She exhibits no distension. There is no tenderness.  Musculoskeletal: She exhibits edema. She exhibits no tenderness.  Severe lymphedema left upper extremity.  Near her left elbow there is evidence of recent incision and drainage.  There is a small amount of serous drainage from this wound.  There is pretty minimal surrounding erythema that extends perhaps a couple centimeters at most.  She can actively range the elbow.  Neurological: She is alert.  Skin: Skin is warm and dry.  Psychiatric: She has a normal mood and affect. Her behavior is normal. Thought content normal.  Nursing note and vitals reviewed.    ED Treatments / Results  Labs (all labs ordered are listed, but only abnormal results are displayed) Labs Reviewed - No data to display  EKG None  Radiology No results found.  Procedures Procedures (including critical care time)  Medications Ordered in ED Medications  lidocaine (PF) (XYLOCAINE) 1 % injection 5 mL (has no administration in time range)     Initial Impression / Assessment and Plan / ED Course  I have reviewed the triage vital signs and the nursing notes.  Pertinent labs & imaging results that were available during my care of the patient were reviewed by me and considered in my medical decision making (see chart for details).     61yF with draining from site of recent I&D near L elbow. Draining small amount of serous appearing fluid on exam. I reopened previous incision and probed around. She has a pretty large pocket there. There was a consider amount of fluid in it, but it appeared serous. I suspect that with her severe lymphedema she is simply accumulating fluid in this space  at this point. I initially thought I would place her back on antibiotics, but after doing I&D, I think observation off of them is more appropriate.   Final Clinical Impressions(s) / ED Diagnoses   Final diagnoses:  Postprocedural seroma of skin and subcutaneous tissue following a dermatologic procedure    ED Discharge Orders    None       Virgel Manifold, MD 02/01/18 [phone]

## 2018-01-31 NOTE — Discharge Instructions (Addendum)
I drained a large pocket of what we call serous fluid. I think that with your severe lymphedema you are accumulating fluid in this area. I am actually not placing you back on antibiotics again at this time. I expect you to continue to have drainage but hopefully this will progressively lessen over the next several days until stopping. I want you to follow back up later this week for a re-check.

## 2018-02-01 ENCOUNTER — Ambulatory Visit (HOSPITAL_COMMUNITY): Payer: BLUE CROSS/BLUE SHIELD

## 2018-02-01 ENCOUNTER — Ambulatory Visit: Payer: BLUE CROSS/BLUE SHIELD | Admitting: Family Medicine

## 2018-02-01 ENCOUNTER — Ambulatory Visit: Payer: BLUE CROSS/BLUE SHIELD | Admitting: Hematology and Oncology

## 2018-02-01 ENCOUNTER — Other Ambulatory Visit (INDEPENDENT_AMBULATORY_CARE_PROVIDER_SITE_OTHER): Payer: BLUE CROSS/BLUE SHIELD

## 2018-02-01 DIAGNOSIS — L03114 Cellulitis of left upper limb: Secondary | ICD-10-CM | POA: Diagnosis not present

## 2018-02-01 LAB — CBC WITH DIFFERENTIAL/PLATELET
Basophils Absolute: 0.2 K/uL — ABNORMAL HIGH (ref 0.0–0.1)
Basophils Relative: 1.8 % (ref 0.0–3.0)
Eosinophils Absolute: 0.3 K/uL (ref 0.0–0.7)
Eosinophils Relative: 2.6 % (ref 0.0–5.0)
HCT: 35.6 % — ABNORMAL LOW (ref 36.0–46.0)
Hemoglobin: 11.7 g/dL — ABNORMAL LOW (ref 12.0–15.0)
Lymphocytes Relative: 22.3 % (ref 12.0–46.0)
Lymphs Abs: 2.3 K/uL (ref 0.7–4.0)
MCHC: 32.8 g/dL (ref 30.0–36.0)
MCV: 89.8 fl (ref 78.0–100.0)
Monocytes Absolute: 0.8 K/uL (ref 0.1–1.0)
Monocytes Relative: 8.4 % (ref 3.0–12.0)
Neutro Abs: 6.5 K/uL (ref 1.4–7.7)
Neutrophils Relative %: 64.9 % (ref 43.0–77.0)
Platelets: 424 K/uL — ABNORMAL HIGH (ref 150.0–400.0)
RBC: 3.97 Mil/uL (ref 3.87–5.11)
RDW: 17.1 % — ABNORMAL HIGH (ref 11.5–15.5)
WBC: 10.1 K/uL (ref 4.0–10.5)

## 2018-02-02 ENCOUNTER — Ambulatory Visit (INDEPENDENT_AMBULATORY_CARE_PROVIDER_SITE_OTHER): Payer: BLUE CROSS/BLUE SHIELD | Admitting: Neurology

## 2018-02-02 ENCOUNTER — Other Ambulatory Visit: Payer: BLUE CROSS/BLUE SHIELD

## 2018-02-02 DIAGNOSIS — R002 Palpitations: Secondary | ICD-10-CM

## 2018-02-02 DIAGNOSIS — R519 Headache, unspecified: Secondary | ICD-10-CM

## 2018-02-02 DIAGNOSIS — E669 Obesity, unspecified: Secondary | ICD-10-CM

## 2018-02-02 DIAGNOSIS — G4733 Obstructive sleep apnea (adult) (pediatric): Secondary | ICD-10-CM

## 2018-02-02 DIAGNOSIS — G4737 Central sleep apnea in conditions classified elsewhere: Secondary | ICD-10-CM

## 2018-02-02 DIAGNOSIS — R51 Headache: Secondary | ICD-10-CM

## 2018-02-02 DIAGNOSIS — R682 Dry mouth, unspecified: Secondary | ICD-10-CM

## 2018-02-02 DIAGNOSIS — R0683 Snoring: Secondary | ICD-10-CM

## 2018-02-02 DIAGNOSIS — F1190 Opioid use, unspecified, uncomplicated: Principal | ICD-10-CM

## 2018-02-03 ENCOUNTER — Other Ambulatory Visit: Payer: Self-pay

## 2018-02-03 ENCOUNTER — Ambulatory Visit (HOSPITAL_COMMUNITY): Payer: BLUE CROSS/BLUE SHIELD | Attending: Cardiovascular Disease

## 2018-02-03 DIAGNOSIS — I5189 Other ill-defined heart diseases: Secondary | ICD-10-CM | POA: Diagnosis not present

## 2018-02-03 DIAGNOSIS — Z8249 Family history of ischemic heart disease and other diseases of the circulatory system: Secondary | ICD-10-CM | POA: Insufficient documentation

## 2018-02-03 DIAGNOSIS — R0602 Shortness of breath: Secondary | ICD-10-CM | POA: Diagnosis not present

## 2018-02-03 DIAGNOSIS — E669 Obesity, unspecified: Secondary | ICD-10-CM | POA: Insufficient documentation

## 2018-02-03 MED ORDER — PERFLUTREN LIPID MICROSPHERE
1.0000 mL | INTRAVENOUS | Status: AC | PRN
Start: 2018-02-03 — End: 2018-02-03
  Administered 2018-02-03: 2 mL via INTRAVENOUS

## 2018-02-04 ENCOUNTER — Ambulatory Visit: Payer: BLUE CROSS/BLUE SHIELD | Admitting: Family Medicine

## 2018-02-04 ENCOUNTER — Telehealth: Payer: Self-pay | Admitting: Family Medicine

## 2018-02-04 ENCOUNTER — Encounter: Payer: Self-pay | Admitting: Family Medicine

## 2018-02-04 VITALS — BP 114/77 | HR 78 | Temp 97.6°F | Ht 65.0 in | Wt 173.8 lb

## 2018-02-04 DIAGNOSIS — R2681 Unsteadiness on feet: Secondary | ICD-10-CM

## 2018-02-04 DIAGNOSIS — E039 Hypothyroidism, unspecified: Secondary | ICD-10-CM

## 2018-02-04 DIAGNOSIS — R29898 Other symptoms and signs involving the musculoskeletal system: Secondary | ICD-10-CM | POA: Diagnosis not present

## 2018-02-04 DIAGNOSIS — I890 Lymphedema, not elsewhere classified: Secondary | ICD-10-CM | POA: Diagnosis not present

## 2018-02-04 DIAGNOSIS — L03114 Cellulitis of left upper limb: Secondary | ICD-10-CM

## 2018-02-04 DIAGNOSIS — T8189XS Other complications of procedures, not elsewhere classified, sequela: Secondary | ICD-10-CM

## 2018-02-04 DIAGNOSIS — R748 Abnormal levels of other serum enzymes: Secondary | ICD-10-CM | POA: Diagnosis not present

## 2018-02-04 NOTE — Telephone Encounter (Signed)
Copied from [place] [phone]. Topic: Quick Communication - See Telephone Encounter >> Feb 04, 2018  3:28 PM Bea Graff, NT wrote: CRM for notification. See Telephone encounter for: 02/04/18. Pt wanting to see if Dr. Raoul Pitch knows who can do a [place] test that would be covered by her insurance? And if she recommends a gastroenterologist? Pt is wanting to change to a new one.

## 2018-02-04 NOTE — Telephone Encounter (Signed)
Need pt neurology referral to occur within 2-4 weeks for new onset weakness and she is in a WHEELCHAIR secondary to weakness without cause. The new pt appt was set for [facility]. Unacceptable timeline, needs in 2-4 weeks for evaluation and functional assessment.  If need referred outside of [facility] network to get sooner appt, then please do so.

## 2018-02-04 NOTE — Progress Notes (Signed)
v    Patient ID: Kimberly Reed, female  DOB: 1956-06-29, 62 y.o.   MRN: [id number] Patient Care Team    Relationship Specialty Notifications Start End  Ma Hillock, DO PCP - General Family Medicine  01/19/18   Nicholas Lose, MD Consulting Physician Hematology and Oncology All results, Admissions 05/28/14   [name], MD Consulting Physician General Surgery All results, Admissions 05/28/14   Eppie Gibson, MD Attending Physician Radiation Oncology All results, Admissions 05/28/14   Particia Nearing, MD Referring Physician Gastroenterology  01/21/18   Delrae Rend, MD Consulting Physician Endocrinology  01/21/18   Kathrynn Ducking, MD Consulting Physician Neurology  01/21/18     Chief Complaint  Patient presents with  . Follow-up    Left Elbow Cellulitis and drainage    Subjective:  Kimberly Reed is a 62 y.o.  female present for follow-up on cellulitis of left elbow   Cellulitis of left elbow/Lyphedema, chronic She finished all antibiotics. Leukocytosis is has resolved. She complains of continued drain aged. Seen in the ED again, did not look infected.  Prior note: She reports she has been taking doxycycline.  She was seen in the ED in which they did an I&D on Friday.  She reports yellow/red drainage since I&D.  She has been keeping dressing changed daily.  She reports the pain has improved and is resolved.  She still endorses weakness.      Depression screen [facility] 2/9 01/21/2018 04/01/2015  Decreased Interest 0 0  Down, Depressed, Hopeless 0 0  PHQ - 2 Score 0 0  Some recent data might be hidden   No flowsheet data found.      Fall Risk  04/01/2015 11/06/2014 10/23/2014 10/05/2014 09/21/2014  Falls in the past year? No No No No No     Immunization History  Administered Date(s) Administered  . Influenza Whole 07/31/2008  . Influenza,inj,Quad PF,6+ Mos 08/16/2014, 08/22/2015    No exam data present  Past Medical History:  Diagnosis Date  . Anemia   . Anxiety   .  Atypical chest pain   . Breast cancer ([place]) 05/2014   left. completed chemo 10/2014. Did not tolerate anti-estrogen.   . Cancer ([place])    left breast dx. 5'46- chemo planned to start 07-09-14- Dr. Lindi Adie[facility]  . Depression   . Family history of heart disease   . Glaucoma   . Gluten intolerance    Dr. Collene Mares  . Headache syndrome 10/13/2016  . History of syncope 2008   loss of bladder control eval by Neuro  . Hot flashes   . Hyperlipemia   . Hypothyroid   . Insomnia   . Lymphedema    L arm  . Migraine without aura   . Personal history of chemotherapy   . Personal history of radiation therapy   . Shingles 2018  . Wears glasses    Allergies  Allergen Reactions  . Other Nausea Only and Other (See Comments)    Antibiotics - Pt cannot identify which antibiotics she reacts to.  Make her nausous and gives her headache.  . Effexor [Venlafaxine] Nausea And Vomiting  . Mucinex [Guaifenesin Er] Nausea Only   Past Surgical History:  Procedure Laterality Date  . BREAST BIOPSY Left 06/07/2014   malignant  . BREAST BIOPSY Left 05/24/2014   malignant  . BREAST LUMPECTOMY Left 01/30/2015   malignant  . BREAST LUMPECTOMY WITH NEEDLE LOCALIZATION AND AXILLARY LYMPH NODE DISSECTION Left 01/30/2015   Procedure: BREAST LUMPECTOMY  WITH NEEDLE LOCALIZATION LEFT AXILLARY DISSECTION REMOVAL OF PORT;  Surgeon: [name], MD;  Location: [facility];  Service: General;  Laterality: Left;  . CATARACT EXTRACTION Left   . DILATION AND CURETTAGE OF UTERUS    . ESOPHAGOGASTRODUODENOSCOPY  06/01/14  . EXAMINATION UNDER ANESTHESIA  02/24/2013   Procedure: EXAM UNDER ANESTHESIA;  Surgeon: Azalia Bilis, MD;  Location: [facility];  Service: Gynecology;;  with removal of prolapsing cervical mass; pap smear and endometrial biopsy  . GLAUCOMA SURGERY Left    x1   . PORTACATH PLACEMENT Right 07/06/2014   Procedure: PORT A CATH  PLACEMENT;  Surgeon: [name], MD;  Location:  [facility];  Service: General;  Laterality: Right;  . TONSILLECTOMY     child  . uterine polyp removed     per hysteroscopy about 1 1/2 yrs ago.   Family History  Problem Relation Age of Onset  . Kidney disease Mother   . Depression Mother   . Early death Mother   . Hypertension Mother   . Heart attack Mother   . Aortic aneurysm Mother   . Hypertension Father   . Dementia Father   . Depression Father   . Kidney disease Brother   . Hypertension Sister   . Heart disease Unknown        "all of moms side"  . Depression Unknown   . Heart failure Cousin    Social History   Socioeconomic History  . Marital status: Single    Spouse name: Not on file  . Number of children: 0  . Years of education: Some college  . Highest education level: Not on file  Occupational History  . 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 Financial resource strain: Not on file  . Food insecurity:    Worry: Not on file    Inability: Not on file  . Transportation needs:    Medical: Not on file    Non-medical: Not on file  Tobacco Use  . Smoking status: Never Smoker  . Smokeless tobacco: Never Used  Substance and Sexual Activity  . Alcohol use: No  . Drug use: No    Comment: in 20's but not now  . Sexual activity: Not Currently    Comment: menarche age 21, P 0, no HRT  Lifestyle  . Physical activity:    Days per week: Not on file    Minutes per session: Not on file  . Stress: Not on file  Relationships  . Social connections:    Talks on phone: Not on file    Gets together: Not on file    Attends religious service: Not on file    Active member of club or organization: Not on file    Attends meetings of clubs or organizations: Not on file    Relationship status: Not on file  . Intimate partner violence:    Fear of current or ex partner: Not on file    Emotionally abused: Not on file    Physically abused: Not on file     Forced sexual activity: Not on file  Other Topics Concern  . Not on file  Social History Narrative   Single.  College.  Lives alone.  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   Right-handed   Dairy products, takes a daily vitamin, drinks caffeine, uses herbal remedies.   Alarm at home.  Wears her seatbelt.      Allergies as of 02/04/2018      Reactions   Other Nausea Only, Other (See Comments)   Antibiotics - Pt cannot identify which antibiotics she reacts to.  Make her nausous and gives her headache.   Effexor [venlafaxine] Nausea And Vomiting   Mucinex [guaifenesin Er] Nausea Only      Medication List        Accurate as of 02/04/18  1:45 PM. Always use your most recent med list.          acetaminophen 325 MG tablet Commonly known as:  TYLENOL Take 2 tablets (650 mg total) by mouth every 6 (six) hours as needed for mild pain or moderate pain (or Fever >/= 101).   COMBIGAN 0.2-0.5 % ophthalmic solution Generic drug:  brimonidine-timolol Place 1 drop into the right eye every 12 (twelve) hours.   dorzolamide 2 % ophthalmic solution Commonly known as:  TRUSOPT Place 1 drop into the right eye 2 (two) times daily.   doxycycline 100 MG tablet Commonly known as:  VIBRA-TABS Take 1 tablet (100 mg total) by mouth 2 (two) times daily.   Fish Oil 1000 MG Caps Take 1,000 mg by mouth daily.   levothyroxine 150 MCG tablet Commonly known as:  SYNTHROID, LEVOTHROID TAKE 1 TABLET BY MOUTH EVERY DAY ON EMPTY STOMACH IN THE MORNING   mupirocin cream 2 % Commonly known as:  BACTROBAN Apply 1 application topically 2 (two) times daily.   Travoprost (BAK Free) 0.004 % Soln ophthalmic solution Commonly known as:  TRAVATAN Place 1 drop into the right eye at bedtime.       All past medical history, surgical history, allergies, family history, immunizations andmedications were updated in the EMR today and reviewed under the history and medication portions of their  EMR.    Recent Results (from the past 2160 hour(s))  CBC and differential     Status: None   Collection Time: 01/10/18 12:00 AM  Result Value Ref Range   Hemoglobin 12.4 12.0 - 16.0   HCT 38 36 - 46   Platelets 363 150 - 399   WBC 88.1   Basic metabolic panel     Status: None   Collection Time: 01/10/18 12:00 AM  Result Value Ref Range   Glucose 99    Creatinine 0.9 0.5 - 1.1  Cortisol     Status: None   Collection Time: 01/11/18 12:01 PM  Result Value Ref Range   Cortisol, Plasma 17.7 ug/dL    Comment: (NOTE) AM    6.7 - 22.6 ug/dL PM   <10.0       ug/dL Performed at [facility], [address] [place], [place] [address]   CMP ([place] only)     Status: Abnormal   Collection Time: 01/11/18 12:02 PM  Result Value Ref Range   Sodium 135 (L) 136 - 145 mmol/L   Potassium 3.7 3.5 - 5.1 mmol/L   Chloride 101 98 - 109 mmol/L   CO2 22 22 - 29 mmol/L   Glucose, Bld 113 70 - 140 mg/dL   BUN 21 7 - 26 mg/dL   Creatinine 1.16 (H) 0.60 - 1.10 mg/dL   Calcium 10.5 (H) 8.4 - 10.4 mg/dL   Total Protein 7.7 6.4 - 8.3 g/dL   Albumin 2.5 (L) 3.5 - 5.0 g/dL   AST 90 (H) 5 - 34 U/L   ALT 41 0 - 55 U/L   Alkaline Phosphatase  315 (H) 40 - 150 U/L   Total Bilirubin 1.4 (H) 0.2 - 1.2 mg/dL   GFR, Est Non Af Am 49 (L) >60 mL/min   GFR, Est AFR Am 57 (L) >60 mL/min    Comment: (NOTE) The eGFR has been calculated using the CKD EPI equation. This calculation has not been validated in all clinical situations. eGFR's persistently <60 mL/min signify possible Chronic Kidney Disease.    Anion gap 12 (H) 3 - 11    Comment: Performed at [facility], [place] [address], [place], [place] [address]  CBC with Differential ([place] Only)     Status: Abnormal   Collection Time: 01/11/18 12:02 PM  Result Value Ref Range   WBC Count 16.3 (H) 3.9 - 10.3 K/uL   RBC 4.28 3.70 - 5.45 MIL/uL   Hemoglobin 12.7 11.6 - 15.9 g/dL   HCT 37.9 34.8 - 46.6 %   MCV 88.6  79.5 - 101.0 fL   MCH 29.7 25.1 - 34.0 pg   MCHC 33.5 31.5 - 36.0 g/dL   RDW 16.0 (H) 11.2 - 14.5 %   Platelet Count 409 (H) 145 - 400 K/uL   Neutrophils Relative % 79 %   Neutro Abs 12.8 (H) 1.5 - 6.5 K/uL   Lymphocytes Relative 13 %   Lymphs Abs 2.1 0.9 - 3.3 K/uL   Monocytes Relative 8 %   Monocytes Absolute 1.3 (H) 0.1 - 0.9 K/uL   Eosinophils Relative 0 %   Eosinophils Absolute 0.1 0.0 - 0.5 K/uL   Basophils Relative 0 %   Basophils Absolute 0.0 0.0 - 0.1 K/uL    Comment: Performed at [facility], [place] [address], [place], [place] [address]  CBC with Differential/Platelet     Status: Abnormal   Collection Time: 01/12/18 11:47 PM  Result Value Ref Range   WBC 18.5 (H) 4.0 - 10.5 K/uL   RBC 4.34 3.87 - 5.11 MIL/uL   Hemoglobin 13.3 12.0 - 15.0 g/dL   HCT 38.1 36.0 - 46.0 %   MCV 87.8 78.0 - 100.0 fL   MCH 30.6 26.0 - 34.0 pg   MCHC 34.9 30.0 - 36.0 g/dL   RDW 15.6 (H) 11.5 - 15.5 %   Platelets 416 (H) 150 - 400 K/uL   Neutrophils Relative % 78 %   Lymphocytes Relative 12 %   Monocytes Relative 10 %   Eosinophils Relative 0 %   Basophils Relative 0 %   Neutro Abs 14.4 (H) 1.7 - 7.7 K/uL   Lymphs Abs 2.2 0.7 - 4.0 K/uL   Monocytes Absolute 1.9 (H) 0.1 - 1.0 K/uL   Eosinophils Absolute 0.0 0.0 - 0.7 K/uL   Basophils Absolute 0.0 0.0 - 0.1 K/uL   Smear Review MORPHOLOGY UNREMARKABLE     Comment: Performed at [facility], [place] [address] [place], [place] [address]  Comprehensive metabolic panel     Status: Abnormal   Collection Time: 01/12/18 11:47 PM  Result Value Ref Range   Sodium 135 135 - 145 mmol/L   Potassium 4.0 3.5 - 5.1 mmol/L   Chloride 99 (L) 101 - 111 mmol/L   CO2 22 22 - 32 mmol/L   Glucose, Bld 106 (H) 65 - 99 mg/dL   BUN 44 (H) 6 - 20 mg/dL   Creatinine, Ser 2.58 (H) 0.44 - 1.00 mg/dL   Calcium 10.1 8.9 - 10.3 mg/dL   Total Protein 7.6 6.5 - 8.1 g/dL  Albumin 2.7 (L) 3.5 - 5.0 g/dL   AST 86 (H) 15 - 41  U/L   ALT 38 14 - 54 U/L   Alkaline Phosphatase 322 (H) 38 - 126 U/L   Total Bilirubin 1.0 0.3 - 1.2 mg/dL   GFR calc non Af Amer 19 (L) >60 mL/min   GFR calc Af Amer 22 (L) >60 mL/min    Comment: (NOTE) The eGFR has been calculated using the CKD EPI equation. This calculation has not been validated in all clinical situations. eGFR's persistently <60 mL/min signify possible Chronic Kidney Disease.    Anion gap 14 5 - 15    Comment: Performed at [name], [place] [address] [place], [place] [address]  CBG monitoring, ED     Status: None   Collection Time: 01/12/18 11:54 PM  Result Value Ref Range   Glucose-Capillary 90 65 - 99 mg/dL   Comment 1 Notify RN   Urinalysis, Routine w reflex microscopic     Status: Abnormal   Collection Time: 01/13/18 12:04 AM  Result Value Ref Range   Color, Urine AMBER (A) YELLOW    Comment: BIOCHEMICALS MAY BE AFFECTED BY COLOR   APPearance CLOUDY (A) CLEAR   Specific Gravity, Urine 1.020 1.005 - 1.030   pH 5.0 5.0 - 8.0   Glucose, UA NEGATIVE NEGATIVE mg/dL   Hgb urine dipstick SMALL (A) NEGATIVE   Bilirubin Urine SMALL (A) NEGATIVE   Ketones, ur 5 (A) NEGATIVE mg/dL   Protein, ur 100 (A) NEGATIVE mg/dL   Nitrite NEGATIVE NEGATIVE   Leukocytes, UA SMALL (A) NEGATIVE   RBC / HPF 6-30 0 - 5 RBC/hpf   WBC, UA TOO NUMEROUS TO COUNT 0 - 5 WBC/hpf   Bacteria, UA RARE (A) NONE SEEN   Squamous Epithelial / LPF 6-30 (A) NONE SEEN   Mucus PRESENT    Hyaline Casts, UA PRESENT    Amorphous Crystal PRESENT     Comment: Performed at [facility], [place] [address] [place], [place] [address]  Na and K (sodium & potassium), rand urine     Status: None   Collection Time: 01/13/18 12:15 AM  Result Value Ref Range   Sodium, Ur 14 mmol/L   Potassium Urine 59 mmol/L    Comment: Performed at [facility], [place] [address] [place], [place] [address]  Creatinine, urine, random     Status: None   Collection  Time: 01/13/18 12:15 AM  Result Value Ref Range   Creatinine, Urine 292.67 mg/dL    Comment: Performed at [facility], [place] [address], [place], [place] [address]  Urine Culture     Status: Abnormal   Collection Time: 01/13/18  2:40 AM  Result Value Ref Range   Specimen Description      URINE, RANDOM Performed at [place] [address] [place], [place] [address]    Special Requests      NONE Performed at [facility], [place] [address], [place], [place] [address]    Culture MULTIPLE SPECIES PRESENT, SUGGEST RECOLLECTION (A)    Report Status 01/14/2018 FINAL   Basic metabolic panel     Status: Abnormal   Collection Time: 01/13/18  4:28 AM  Result Value Ref Range   Sodium 136 135 - 145 mmol/L   Potassium 4.0 3.5 - 5.1 mmol/L   Chloride 100 (L) 101 - 111 mmol/L   CO2 19 (L) 22 - 32 mmol/L   Glucose,  Bld 103 (H) 65 - 99 mg/dL   BUN 43 (H) 6 - 20 mg/dL   Creatinine, Ser 2.42 (H) 0.44 - 1.00 mg/dL   Calcium 9.7 8.9 - 10.3 mg/dL   GFR calc non Af Amer 20 (L) >60 mL/min   GFR calc Af Amer 23 (L) >60 mL/min    Comment: (NOTE) The eGFR has been calculated using the CKD EPI equation. This calculation has not been validated in all clinical situations. eGFR's persistently <60 mL/min signify possible Chronic Kidney Disease.    Anion gap 17 (H) 5 - 15    Comment: Performed at [facility], [place] [address] [place], [place] [address]  HIV antibody (Routine Testing)     Status: None   Collection Time: 01/13/18  4:28 AM  Result Value Ref Range   HIV Screen 4th Generation wRfx Non Reactive Non Reactive    Comment: (NOTE) Performed At: [facility] [place], [place] [id number] Rush Farmer MD [id number] Performed at [facility], [place] [name] [place], [place] [address]   CK     Status: None   Collection Time: 01/13/18  4:28 AM  Result Value Ref Range   Total  CK 50 38 - 234 U/L    Comment: Performed at [facility], [place] [address] [place], [place] [address]  CBC with Differential/Platelet     Status: Abnormal   Collection Time: 01/14/18  4:13 AM  Result Value Ref Range   WBC 16.7 (H) 4.0 - 10.5 K/uL   RBC 3.99 3.87 - 5.11 MIL/uL   Hemoglobin 11.7 (L) 12.0 - 15.0 g/dL   HCT 34.9 (L) 36.0 - 46.0 %   MCV 87.5 78.0 - 100.0 fL   MCH 29.3 26.0 - 34.0 pg   MCHC 33.5 30.0 - 36.0 g/dL   RDW 15.8 (H) 11.5 - 15.5 %   Platelets 372 150 - 400 K/uL   Neutrophils Relative % 72 %   Lymphocytes Relative 13 %   Monocytes Relative 14 %   Eosinophils Relative 1 %   Basophils Relative 0 %   Neutro Abs 12.0 (H) 1.7 - 7.7 K/uL   Lymphs Abs 2.2 0.7 - 4.0 K/uL   Monocytes Absolute 2.3 (H) 0.1 - 1.0 K/uL   Eosinophils Absolute 0.2 0.0 - 0.7 K/uL   Basophils Absolute 0.0 0.0 - 0.1 K/uL   Smear Review MORPHOLOGY UNREMARKABLE     Comment: Performed at [facility], [place] [address] [place], [place] [address]  Basic metabolic panel     Status: Abnormal   Collection Time: 01/14/18  4:13 AM  Result Value Ref Range   Sodium 134 (L) 135 - 145 mmol/L   Potassium 3.4 (L) 3.5 - 5.1 mmol/L   Chloride 103 101 - 111 mmol/L   CO2 18 (L) 22 - 32 mmol/L   Glucose, Bld 102 (H) 65 - 99 mg/dL   BUN 44 (H) 6 - 20 mg/dL   Creatinine, Ser 1.69 (H) 0.44 - 1.00 mg/dL   Calcium 9.8 8.9 - 10.3 mg/dL   GFR calc non Af Amer 31 (L) >60 mL/min   GFR calc Af Amer 36 (L) >60 mL/min    Comment: (NOTE) The eGFR has been calculated using the CKD EPI equation. This calculation has not been validated in all clinical situations. eGFR's persistently <60 mL/min signify possible Chronic Kidney Disease.    Anion gap 13 5 - 15    Comment: Performed at [facility]  [place] [address], [place], [place] [address]  CBC w/Diff     Status: Abnormal   Collection Time: 01/19/18 11:07 AM  Result Value Ref Range   WBC 16.2 (H) 4.0 - 10.5 K/uL    RBC 4.06 3.87 - 5.11 Mil/uL   Hemoglobin 11.6 (L) 12.0 - 15.0 g/dL   HCT 36.4 36.0 - 46.0 %   MCV 89.5 78.0 - 100.0 fl   MCHC 31.9 30.0 - 36.0 g/dL   RDW 15.6 (H) 11.5 - 15.5 %   Platelets 441.0 (H) 150.0 - 400.0 K/uL   Neutrophils Relative % 78.3 (H) 43.0 - 77.0 %   Lymphocytes Relative 11.6 (L) 12.0 - 46.0 %   Monocytes Relative 7.4 3.0 - 12.0 %   Eosinophils Relative 1.9 0.0 - 5.0 %   Basophils Relative 0.8 0.0 - 3.0 %   Neutro Abs 12.7 (H) 1.4 - 7.7 K/uL   Lymphs Abs 1.9 0.7 - 4.0 K/uL   Monocytes Absolute 1.2 (H) 0.1 - 1.0 K/uL   Eosinophils Absolute 0.3 0.0 - 0.7 K/uL   Basophils Absolute 0.1 0.0 - 0.1 K/uL  Basic Metabolic Panel (BMET)     Status: Abnormal   Collection Time: 01/19/18 11:07 AM  Result Value Ref Range   Sodium 135 135 - 145 mEq/L   Potassium 4.2 3.5 - 5.1 mEq/L   Chloride 101 96 - 112 mEq/L   CO2 25 19 - 32 mEq/L   Glucose, Bld 108 (H) 70 - 99 mg/dL   BUN 13 6 - 23 mg/dL   Creatinine, Ser 0.73 0.40 - 1.20 mg/dL   Calcium 10.0 8.4 - 10.5 mg/dL   GFR 85.40 >60.00 mL/min  TSH     Status: Abnormal   Collection Time: 01/19/18 11:07 AM  Result Value Ref Range   TSH 9.84 (H) 0.35 - 4.50 uIU/mL  Basic metabolic panel     Status: Abnormal   Collection Time: 01/21/18  9:27 PM  Result Value Ref Range   Sodium 134 (L) 135 - 145 mmol/L   Potassium 4.1 3.5 - 5.1 mmol/L   Chloride 101 101 - 111 mmol/L   CO2 19 (L) 22 - 32 mmol/L   Glucose, Bld 93 65 - 99 mg/dL   BUN 14 6 - 20 mg/dL   Creatinine, Ser 0.93 0.44 - 1.00 mg/dL   Calcium 9.4 8.9 - 10.3 mg/dL   GFR calc non Af Amer >60 >60 mL/min   GFR calc Af Amer >60 >60 mL/min    Comment: (NOTE) The eGFR has been calculated using the CKD EPI equation. This calculation has not been validated in all clinical situations. eGFR's persistently <60 mL/min signify possible Chronic Kidney Disease.    Anion gap 14 5 - 15    Comment: Performed at [facility], [place] [place], [place] [address]  CBC  with Differential     Status: Abnormal   Collection Time: 01/21/18  9:27 PM  Result Value Ref Range   WBC 16.7 (H) 4.0 - 10.5 K/uL   RBC 4.08 3.87 - 5.11 MIL/uL   Hemoglobin 11.8 (L) 12.0 - 15.0 g/dL   HCT 34.6 (L) 36.0 - 46.0 %   MCV 84.8 78.0 - 100.0 fL   MCH 28.9 26.0 - 34.0 pg   MCHC 34.1 30.0 - 36.0 g/dL   RDW 16.1 (H) 11.5 - 15.5 %   Platelets 453 (H) 150 - 400 K/uL   Neutrophils Relative % 78 %  Lymphocytes Relative 12 %   Monocytes Relative 9 %   Eosinophils Relative 1 %   Basophils Relative 0 %   Neutro Abs 13.0 (H) 1.7 - 7.7 K/uL   Lymphs Abs 2.0 0.7 - 4.0 K/uL   Monocytes Absolute 1.5 (H) 0.1 - 1.0 K/uL   Eosinophils Absolute 0.2 0.0 - 0.7 K/uL   Basophils Absolute 0.0 0.0 - 0.1 K/uL   WBC Morphology TOXIC GRANULATION     Comment: Performed at [facility], [place] [place], [place] [address]  Wound or Superficial Culture     Status: None   Collection Time: 01/21/18  9:27 PM  Result Value Ref Range   Specimen Description      ABSCESS Performed at [facility], [place] [place], [place] [address]    Special Requests      Normal Performed at [facility], [place]., [place], [place] [address]    Gram Stain      FEW WBC PRESENT, PREDOMINANTLY PMN RARE GRAM POSITIVE COCCI IN PAIRS Performed at [facility], [place] [address], [place], [place] [address]    Culture FEW STAPHYLOCOCCUS AUREUS    Report Status 01/24/2018 FINAL    Organism ID, Bacteria STAPHYLOCOCCUS AUREUS       Susceptibility   Staphylococcus aureus - MIC*    CIPROFLOXACIN >=8 RESISTANT Resistant     ERYTHROMYCIN >=8 RESISTANT Resistant     GENTAMICIN <=0.5 SENSITIVE Sensitive     OXACILLIN 0.5 SENSITIVE Sensitive     TETRACYCLINE <=1 SENSITIVE Sensitive     VANCOMYCIN <=0.5 SENSITIVE Sensitive     TRIMETH/SULFA <=10 SENSITIVE Sensitive     CLINDAMYCIN <=0.25 SENSITIVE Sensitive     RIFAMPIN <=0.5 SENSITIVE Sensitive     Inducible  Clindamycin NEGATIVE Sensitive     * FEW STAPHYLOCOCCUS AUREUS  CBC w/Diff     Status: Abnormal   Collection Time: 02/01/18 11:20 AM  Result Value Ref Range   WBC 10.1 4.0 - 10.5 K/uL   RBC 3.97 3.87 - 5.11 Mil/uL   Hemoglobin 11.7 (L) 12.0 - 15.0 g/dL   HCT 35.6 (L) 36.0 - 46.0 %   MCV 89.8 78.0 - 100.0 fl   MCHC 32.8 30.0 - 36.0 g/dL   RDW 17.1 (H) 11.5 - 15.5 %   Platelets 424.0 (H) 150.0 - 400.0 K/uL   Neutrophils Relative % 64.9 43.0 - 77.0 %   Lymphocytes Relative 22.3 12.0 - 46.0 %   Monocytes Relative 8.4 3.0 - 12.0 %   Eosinophils Relative 2.6 0.0 - 5.0 %   Basophils Relative 1.8 0.0 - 3.0 %   Neutro Abs 6.5 1.4 - 7.7 K/uL   Lymphs Abs 2.3 0.7 - 4.0 K/uL   Monocytes Absolute 0.8 0.1 - 1.0 K/uL   Eosinophils Absolute 0.3 0.0 - 0.7 K/uL   Basophils Absolute 0.2 (H) 0.0 - 0.1 K/uL     ROS: 14 pt review of systems performed and negative (unless mentioned in an HPI)  Objective: BP 114/77 (BP Location: Right Arm, Patient Position: Sitting, Cuff Size: Large)   Pulse 78   Temp 97.6 F (36.4 C) (Oral)   Ht 5' 5"  (1.651 m)   Wt 173 lb 12.8 oz (78.8 kg)   LMP 02/15/2013   SpO2 94%   BMI 28.92 kg/m   Gen: Afebrile. No acute distress. In a wheel chair.  HENT: AT. [place].  MMM.  Eyes:Pupils Equal Round Reactive  to light, Extraocular movements intact,  Conjunctiva without redness, discharge or icterus. MSK: no erythema, small opening from I&D left elbow with yellow/clear thin drainage. Chronic lymphedema left UE.  Skin: no rashes, purpura or petechiae.   Assessment/plan: Kimberly Reed is a 62 y.o. female present for follow-up on cellulitis and leukocytosis Lymphedema of left upper extremity/swelling/Cellulitis of left elbow -Cellulitis of left elbow is Resolved.  Drainage remains from ID wound. Referral to wound care.  - Strongly encourage her to start having physical therapy sessions routinely for with her lymphedema specialist (jennifer).  She reports she has then scheduled  for this coming week.  -Strongly encourage patient to wear her lymphedema sleeve provided to her by her oncologist's elbow stops draining. -Patient is in agreement and states she will wear the sleeve and start PT  Recapped all the following again:  - Weakness/unstable gait/Vertigo: Pt--> for vestibular rehab and functional assessment - other PT--> lymphadenopathy--> keep appts with [facility].  - weakness/using wheel chair without known cause--> Neurology referral will call you, I have requested this appt be pushed up sooner. I want it done in 2-4 weeks.  - wound care--> they will call you to schedule and take care of the nonhealing wound. Until then keep clean, dry, covered and use Bactroban ointment. - make sure to have sleep study read and follow through with results and get CPAP if  Indicated. This can cause fatigue and weakness.  - I am glad you have your CT rescheduled for abd/pelvis. Please ask Dr. Consuello Closs to also send the result to [place] so we can montior.     Return if symptoms worsen or fail to improve.  Note is dictated utilizing voice recognition software. Although note has been proof read prior to signing, occasional typographical errors still can be missed. If any questions arise, please do not hesitate to call for verification.  Electronically signed by: Howard Pouch, DO [place]

## 2018-02-04 NOTE — Patient Instructions (Addendum)
Referred to Dr. Buddy Duty in case he needed one --> We need to get your thyroid under control this is a very likely the cause of many of your current issues.   Referrals placed for: - Pt--> for vestibular rehab and functional assessment - other PT--> lymphadenopathy--> keep appts with [facility].  - Neurology referral will call you, I have requested this appt be pushed up sooner. I want it done in 2-4 weeks.  - wound care--> they will call you to schedule and take care of the nonhealing wound. Until then keep clean, dry, covered and use Bactroban ointment. - make sure to have sleep study read and follow through with results and get CPAP if  Indicated. This can cause fatigue and weakness.  - I am glad you have your CT rescheduled for abd/pelvis. Please ask Dr. Consuello Closs to also send the result to [place] so we can montior.     Hypothyroidism Hypothyroidism is a disorder of the thyroid. The thyroid is a large gland that is located in the lower front of the neck. The thyroid releases hormones that control how the body works. With hypothyroidism, the thyroid does not make enough of these hormones. What are the causes? Causes of hypothyroidism may include:  Viral infections.  Pregnancy.  Your own defense system (immune system) attacking your thyroid.  Certain medicines.  Birth defects.  Past radiation treatments to your head or neck.  Past treatment with radioactive iodine.  Past surgical removal of part or all of your thyroid.  Problems with the gland that is located in the center of your brain (pituitary).  What are the signs or symptoms? Signs and symptoms of hypothyroidism may include:  Feeling as though you have no energy (lethargy).  Inability to tolerate cold.  Weight gain that is not explained by a change in diet or exercise habits.  Dry skin.  Coarse hair.  Menstrual irregularity.  Slowing of thought processes.  Constipation.  Sadness or depression.  How is this  diagnosed? Your health care provider may diagnose hypothyroidism with blood tests and ultrasound tests. How is this treated? Hypothyroidism is treated with medicine that replaces the hormones that your body does not make. After you begin treatment, it may take several weeks for symptoms to go away. Follow these instructions at home:  Take medicines only as directed by your health care provider.  If you start taking any new medicines, tell your health care provider.  Keep all follow-up visits as directed by your health care provider. This is important. As your condition improves, your dosage needs may change. You will need to have blood tests regularly so that your health care provider can watch your condition. Contact a health care provider if:  Your symptoms do not get better with treatment.  You are taking thyroid replacement medicine and: ? You sweat excessively. ? You have tremors. ? You feel anxious. ? You lose weight rapidly. ? You cannot tolerate heat. ? You have emotional swings. ? You have diarrhea. ? You feel weak. Get help right away if:  You develop chest pain.  You develop an irregular heartbeat.  You develop a rapid heartbeat. This information is not intended to replace advice given to you by your health care provider. Make sure you discuss any questions you have with your health care provider. Document Released: 10/12/2005 Document Revised: 03/19/2016 Document Reviewed: 02/27/2014 Elsevier Interactive Patient Education  2018 [employer].

## 2018-02-04 NOTE — Telephone Encounter (Signed)
I'll check with [facility] to see if they have anything sooner.

## 2018-02-07 ENCOUNTER — Encounter: Payer: Self-pay | Admitting: Family Medicine

## 2018-02-07 ENCOUNTER — Telehealth: Payer: Self-pay | Admitting: Family Medicine

## 2018-02-07 NOTE — Telephone Encounter (Signed)
Neither of those questions are something I can field for her. She can call around to different GI in her network.

## 2018-02-07 NOTE — Telephone Encounter (Signed)
Spoke with patient reviewed information patient will contact her insurance company.

## 2018-02-07 NOTE — Telephone Encounter (Signed)
Patient has a neurology appointment set up for 05/27/18 with [facility]. I called [facility] to see they had any earlier openings. They had one for 04/22/18.  Dr. Raoul Pitch advised that is too long of a wait due to patient becoming wheel chair bound recently. I was able to find an appointment at [facility] on 02/10/18 at 3:00 with 2:45 arrival. Their address is [address], [place], [place] [address]. I have scheduled as that their next opening is not until the end of the month.  Patient has a myocard perfusion test the same day (4/18) which is a 3 hour test that starts at 1:15PM. If patient can go over to [facility] they can do it 7:15AM, 7:30AM, or 7:45AM so that they patient can go to both appointments in one day or she can move the test to a different day if that is a better option for her. The patient can reschedule the cardiac appointment at [phone]. I have left a VM for the patient to CB.

## 2018-02-08 ENCOUNTER — Ambulatory Visit: Payer: BLUE CROSS/BLUE SHIELD | Admitting: Hematology and Oncology

## 2018-02-08 ENCOUNTER — Telehealth: Payer: Self-pay | Admitting: Family Medicine

## 2018-02-08 NOTE — Telephone Encounter (Signed)
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 She states that [employer] will be reaching out to our office for office visit notes since patient feels that her PCP notes will be the most comprehensive notes. Patient gave a verbal okay to release all records from our office as well as Dr. Geralyn Flash office, her oncologist.

## 2018-02-08 NOTE — Telephone Encounter (Signed)
Patient has been scheduled with [facility] with Dr. Berdine Addison for 02/10/18 at 9:30. She was scheduled with [facility] however they rescheduled from April to May after they reviewed the office visit notes I faxed.

## 2018-02-08 NOTE — Telephone Encounter (Signed)
Great. Thank you! Make sure we physically talk to patient and not count on VM, so that we can make sure she does not miss any of those appts.

## 2018-02-09 ENCOUNTER — Ambulatory Visit (HOSPITAL_COMMUNITY)
Admission: RE | Admit: 2018-02-09 | Discharge: 2018-02-09 | Disposition: A | Discharge status: Home / Self Care | Payer: BLUE CROSS/BLUE SHIELD | Source: Ambulatory Visit | Attending: Hematology and Oncology | Admitting: Hematology and Oncology

## 2018-02-09 ENCOUNTER — Encounter: Payer: Self-pay | Admitting: Neurology

## 2018-02-09 ENCOUNTER — Telehealth: Payer: Self-pay | Admitting: Hematology and Oncology

## 2018-02-09 ENCOUNTER — Encounter (HOSPITAL_COMMUNITY): Payer: Self-pay

## 2018-02-09 DIAGNOSIS — C787 Secondary malignant neoplasm of liver and intrahepatic bile duct: Secondary | ICD-10-CM | POA: Diagnosis not present

## 2018-02-09 DIAGNOSIS — C50412 Malignant neoplasm of upper-outer quadrant of left female breast: Secondary | ICD-10-CM

## 2018-02-09 DIAGNOSIS — R590 Localized enlarged lymph nodes: Secondary | ICD-10-CM | POA: Insufficient documentation

## 2018-02-09 DIAGNOSIS — Z170 Estrogen receptor positive status [ER+]: Secondary | ICD-10-CM

## 2018-02-09 DIAGNOSIS — R160 Hepatomegaly, not elsewhere classified: Secondary | ICD-10-CM | POA: Insufficient documentation

## 2018-02-09 DIAGNOSIS — N200 Calculus of kidney: Secondary | ICD-10-CM | POA: Insufficient documentation

## 2018-02-09 DIAGNOSIS — R918 Other nonspecific abnormal finding of lung field: Secondary | ICD-10-CM | POA: Insufficient documentation

## 2018-02-09 DIAGNOSIS — C7951 Secondary malignant neoplasm of bone: Secondary | ICD-10-CM | POA: Insufficient documentation

## 2018-02-09 MED ORDER — IOHEXOL 300 MG/ML  SOLN
100.0000 mL | Freq: Once | INTRAMUSCULAR | Status: AC | PRN
  Administered 2018-02-09: 100 mL via INTRAVENOUS

## 2018-02-09 NOTE — Telephone Encounter (Signed)
I left a voicemail asking the patient to call us back to review the results of the CT scans. The CT scan showed extensive liver metastases. Patient will need to come back to see [place] so that we can discuss starting Ibrance with Faslodex. Because I am going to be leaving out of town, we will see if she can come to see our nurse practitioner Ria Comment so that she can discuss her treatment plan.

## 2018-02-10 ENCOUNTER — Ambulatory Visit (HOSPITAL_COMMUNITY)
Admission: RE | Admit: 2018-02-10 | Payer: BLUE CROSS/BLUE SHIELD | Source: Ambulatory Visit | Attending: Cardiovascular Disease | Admitting: Cardiovascular Disease

## 2018-02-10 ENCOUNTER — Encounter: Payer: Self-pay | Admitting: General Practice

## 2018-02-10 ENCOUNTER — Ambulatory Visit: Payer: BLUE CROSS/BLUE SHIELD | Attending: Family Medicine | Admitting: Physical Therapy

## 2018-02-10 ENCOUNTER — Telehealth: Payer: Self-pay | Admitting: Family Medicine

## 2018-02-10 ENCOUNTER — Telehealth: Payer: Self-pay | Admitting: Medical Oncology

## 2018-02-10 ENCOUNTER — Encounter: Payer: Self-pay | Admitting: Physical Therapy

## 2018-02-10 ENCOUNTER — Other Ambulatory Visit: Payer: Self-pay

## 2018-02-10 DIAGNOSIS — M6281 Muscle weakness (generalized): Secondary | ICD-10-CM

## 2018-02-10 DIAGNOSIS — R2681 Unsteadiness on feet: Secondary | ICD-10-CM

## 2018-02-10 DIAGNOSIS — R42 Dizziness and giddiness: Secondary | ICD-10-CM | POA: Diagnosis present

## 2018-02-10 DIAGNOSIS — R262 Difficulty in walking, not elsewhere classified: Secondary | ICD-10-CM | POA: Diagnosis present

## 2018-02-10 NOTE — Telephone Encounter (Signed)
Asking for name of med supply store to get lymphedema sleeve. Her cellulitis "is controlled and I am  ready to start with the sleeve" . I was telling the pt to call [facility] and she said I have to hang up " and did.

## 2018-02-10 NOTE — Telephone Encounter (Signed)
Called transferred to me from [facility] with [place].  Patient calling regarding neurology referral.  I spoke with patient who immediately asks to speak with Evalie since she has been helping her with this referral.  I explained to patient that Shirah was out of the office today and asked how I could assist her.   Patient begins to go in deep detail about how Amberlee has been assisting her with getting in with a neurologist as soon as possible since [facility] was unable to see her for several months.  Patient states she had an appt with [facility] today(02/10/18) at 9:30am.  However, the patient states she cancelled his appt because she didn't feel like she needed to see another neurologist because the one she saw last year didn't really help her.  He ordered an MRI that turned out to be normal.  She states she doesn't know if she really needs to see an neurologist.  However, she doesn't know if she  "jumped the gun" by cancelling her appt

## 2018-02-10 NOTE — Progress Notes (Signed)
[facility]ual [facility] Note  LVM of encouragement and reminder of ongoing availability for support. Encouraged Weslynn to call back when helpful, but will phone again next week if she does not return call.   [place], [place], [facility] Pager [phone] Voicemail [phone]

## 2018-02-10 NOTE — Telephone Encounter (Signed)
(  Continued from previous note, encountered closed in error.  See initial phone encounter dated 02/10/18 at 9:24am)  I asked patient what she needed from our office at this time since she had cancelled her appt.  She became upset and stated that I wasn't listening to what she was saying.  Patient states she would just call back when Almas returned to the office and hung the phone up.

## 2018-02-11 ENCOUNTER — Telehealth (HOSPITAL_COMMUNITY): Payer: Self-pay

## 2018-02-11 ENCOUNTER — Other Ambulatory Visit: Payer: Self-pay | Admitting: Hematology and Oncology

## 2018-02-11 ENCOUNTER — Other Ambulatory Visit: Payer: Self-pay

## 2018-02-11 ENCOUNTER — Telehealth: Payer: Self-pay

## 2018-02-11 ENCOUNTER — Telehealth: Payer: Self-pay | Admitting: Physical Therapy

## 2018-02-11 ENCOUNTER — Telehealth: Payer: Self-pay | Admitting: *Deleted

## 2018-02-11 DIAGNOSIS — C50412 Malignant neoplasm of upper-outer quadrant of left female breast: Secondary | ICD-10-CM

## 2018-02-11 NOTE — Telephone Encounter (Signed)
Spoke with patient regarding coming in next week to see Kimberly Bihari, NP and Kimberly Reed, PharmD to get started on Ibrance and Faslodex per Dr. Lindi Adie. See note from 04/17. Scheduled pt Monday. She is aware of date/time.  Cyndia Bent RN

## 2018-02-11 NOTE — Telephone Encounter (Signed)
Note will be forwarded appropriately to correct pod  - thank you

## 2018-02-11 NOTE — Telephone Encounter (Signed)
Hello Dr. Raoul Pitch, Thank you for the referral of this patient.  I was reviewing her chart and medical history yesterday prior to her evaluation and saw where her recent CT scans ordered by her oncologist showed mets to liver and thoracic spine.  Salisa was unaware of the CT results when I saw her and I did not share any information with her.  We had a very limited evaluation due to significant headache pain and the length of time Kruti spent sharing her history and subjective complaints.  In light of the CT scan results, I was also hesitant to perform any positional vestibular testing.  I am concerned that her dizziness and LE weakness may be due to the metastatic lesions and feel I need further guidance from you and her oncologist regarding: is it appropriate to go forward with physical therapy at this time, are there any specific activity restrictions you would advise?    I am sending you her evaluation for approval and if you feel she is appropriate to continue with conservative PT I will complete a more thorough assessment of vestibular and motor impairments and functional limitations and will resend the certification with more specific goals.    Thank you for any insight and guidance you can provide, Rico Junker, PT, DPT 02/11/18    9:26 AM

## 2018-02-11 NOTE — Telephone Encounter (Signed)
Encounter complete. 

## 2018-02-11 NOTE — Therapy (Signed)
[facility] [address] [place], [place], [address] Phone: [phone]   Fax:  [phone]  Physical Therapy Evaluation  Patient Details  Name: Kimberly Reed MRN: [id number] Date of Birth: [birth year] Referring Provider: Ma Hillock, DO   Encounter Date: 02/11/63  PT End of Session - 02/11/18 0856    Visit Number  1    Number of Visits  13    Date for PT Re-Evaluation  04/12/18    Authorization Type  BCBS, VL: 90 combined PT, OT, SLP - used 1    Authorization - Visit Number  2    Authorization - Number of Visits  90    PT Start Time  1400    PT Stop Time  7654    PT Time Calculation (min)  58 min    Activity Tolerance  Patient limited by pain;Other (comment) pt verbose and tangental, prolonged history    Behavior During Therapy  Anxious tearful       Past Medical History:  Diagnosis Date  . Anemia   . Anxiety   . Atypical chest pain   . Breast cancer ([place]) 05/2014   left. completed chemo 10/2014. Did not tolerate anti-estrogen.   . Cancer ([place])    left breast dx. 6'50- chemo planned to start 07-09-14- Dr. Lindi Adie, [facility] follows  . Depression   . Family history of heart disease   . Glaucoma   . Gluten intolerance    Dr. Collene Mares  . Headache syndrome 10/13/2016  . History of syncope 2008   loss of bladder control eval by Neuro  . Hot flashes   . Hyperlipemia   . Hypothyroid   . Insomnia   . Lymphedema    L arm  . Migraine without aura   . Personal history of chemotherapy   . Personal history of radiation therapy   . Shingles 2018  . Wears glasses     Past Surgical History:  Procedure Laterality Date  . BREAST BIOPSY Left 06/07/2014   malignant  . BREAST BIOPSY Left 05/24/2014   malignant  . BREAST LUMPECTOMY Left 01/30/2015   malignant  . BREAST LUMPECTOMY WITH NEEDLE LOCALIZATION AND AXILLARY LYMPH NODE DISSECTION Left 01/30/2015   Procedure: BREAST LUMPECTOMY WITH NEEDLE LOCALIZATION  LEFT AXILLARY DISSECTION REMOVAL OF PORT;  Surgeon: [name], MD;  Location: [facility];  Service: General;  Laterality: Left;  . CATARACT EXTRACTION Left   . DILATION AND CURETTAGE OF UTERUS    . ESOPHAGOGASTRODUODENOSCOPY  06/01/14  . EXAMINATION UNDER ANESTHESIA  02/24/2013   Procedure: EXAM UNDER ANESTHESIA;  Surgeon: Azalia Bilis, MD;  Location: [facility];  Service: Gynecology;;  with removal of prolapsing cervical mass; pap smear and endometrial biopsy  . GLAUCOMA SURGERY Left    x1   . PORTACATH PLACEMENT Right 07/06/2014   Procedure: PORT A CATH  PLACEMENT;  Surgeon: [name], MD;  Location: [facility];  Service: General;  Laterality: Right;  . TONSILLECTOMY     child  . uterine polyp removed     per hysteroscopy about 1 1/2 yrs ago.    There were no vitals filed for this visit.   Subjective Assessment - 02/10/18 1421    Subjective  Pt reports vertigo began 2 years ago and assumed it was related to chemotherapy (from previous episode of breast CA).  The dizziness is worse in standing and when ambulating, feels better when she is sitting down or lying down.  Had to recently go on short term disability but not due to dizziness, but due to progressive weakness and LUE worsening lymphedema and cellulitis.  Now has significant difficulty walking around her home and has to primarily use a w/c when she is out in the community.    Pertinent History  anemia, anxiety, chest pain, breast cancer with chemotherapy and radiation in 2015-2016, depression, glaucoma, migraines, hyperlipidemia, hypothyroid, insomnia, LUE lymphedema, and shingles    Limitations  Standing;Walking    How long can you stand comfortably?  minutes    How long can you walk comfortably?  short household distances    Diagnostic tests  Recent CT showed metastatic lesions in liver and T-spine (pt unaware of these results at PT eval)    Patient Stated Goals  To be able to return to work and walk without  dizziness    Currently in Pain?  Yes    Pain Score  5     Pain Location  Head    Pain Descriptors / Indicators  Headache    Pain Type  Acute pain    Pain Onset  Efrain Sella PT Assessment - 02/11/18 0847      Assessment   Medical Diagnosis  Dizziness and unstable gait    Referring Provider  Ma Hillock, DO    Onset Date/Surgical Date  02/04/18    Next MD Visit  02/28/18      Precautions   Precautions  Other (comment);Fall    Precaution Comments  Recent CT scan showed liver and T-spine mets (pt unaware of results at PT eval); anemia, anxiety, chest pain, breast cancer with chemotherapy and radiation in 2015-2016, depression, glaucoma, migraines, hyperlipidemia, hypothyroid, insomnia, LUE lymphedema, and shingles      Balance Screen   Has the patient had a decrease in activity level because of a fear of falling?   Yes    Is the patient reluctant to leave their home because of a fear of falling?   Yes      [place] residence    Living Arrangements  Alone    Available Help at Discharge  Other (Comment) none    Type of Home  Apartment    Additional Comments  unable to get full description-will discuss with pt at next visit.  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; mortgage collections      Cognition   Behaviors  Other (comment) anxious, tangental conversation      Observation/Other Assessments   Focus on Therapeutic Outcomes (FOTO)   not indicated      Sensation   Light Touch  -- TBD      ROM / Strength   AROM / PROM / Strength  Strength      Strength   Overall Strength  Deficits    Overall Strength Comments  TBD      Transfers   Comments  TBD      Ambulation/Gait   Gait Comments  TBD      Standardized Balance Assessment    Standardized Balance Assessment  Vestibular Evaluation           Vestibular Assessment - 02/10/18 1436  Vestibular Assessment   General Observation  arrived in w/c; reports history of severe migraines.  Denies changes in vision other than glaucoma and cataracts - needs new glasses.      Symptom Behavior   Type of Dizziness  "Funny feeling in head"    Frequency of Dizziness  every day    Duration of Dizziness  subsides in a few minutes after sitting; constant when standing -gets worse the longer she stands/walks    Aggravating Factors  Comment standing and walking    Relieving Factors  Comments sitting and lying down      Occulomotor Exam   Occulomotor Alignment  Normal    Spontaneous  Absent    Gaze-induced  Absent    Smooth Pursuits  Intact    Saccades  Intact    Comment  covergence intact      Vestibulo-Occular Reflex   VOR to Slow Head Movement  Comment symptomatic for dizziness    Comment  HIT: negative bilaterally          Objective measurements completed on examination: See above findings.              PT Education - 02/11/18 0854    Education provided  Yes    Education Details  purpose of vestibular evaluation; various causes of vertigo, limited treatment options until sources of progressive weakness identified (pt unaware of CT results at eval), PT POC and goals at this time    Person(s) Educated  Patient    Methods  Explanation    Comprehension  Verbalized understanding       PT Short Term Goals - 02/11/18 0908      PT SHORT TERM GOAL #1   Title  Pt will tolerate full vestibular evaluation and initiate vestibular HEP    Time  4    Period  Weeks    Status  New    Target Date  03/12/18      PT SHORT TERM GOAL #2   Title  Pt will tolerate full motor assessment and initiate strengthening and balance HEP    Time  4    Period  Weeks    Status  New    Target Date  03/12/18      PT SHORT TERM GOAL #3   Title  Further STG to be set after  clearance from physician and more thorough assessment.        PT Long Term Goals - 02/11/18 0914      PT LONG TERM GOAL #1   Title  Pt will independent with final vestibular, balance and strength HEP    Time  8    Period  Weeks    Status  New    Target Date  04/12/18      PT LONG TERM GOAL #2   Title  Further LTG to be set after physician clearance and more thorough assessment             Plan - 02/11/18 0859    Clinical Impression Statement  Pt is a 62 year old female referred to Neuro OPPT for evaluation of dizziness and unsteady gait.  Pt's PMH is significant for the following: anemia, anxiety, chest pain, breast cancer with chemotherapy and radiation in 2015-2016, depression, glaucoma, migraines, hyperlipidemia, hypothyroid, insomnia, LUE lymphedema, and shingles.  Patient's recent routine CT scan of chest and abdomen revealed multiple sites of metastatic lesions (results had not been communicated with pt at the time of PT eval)  including liver and thoracic spine which may explain pt's progressive weakness.  Increased time during evaluation was required to gather patient's history and subjective complaints and PT was unable to complete full vestibular and motor assessment; assessment was also limited by patient pain.  Based on limited exam pt does present with disequilibrium, motion sensitivity, impaired strength and balance placing pt at increased falls risk.  Positional vertigo could not be ruled out during this assessment.  Pt would benefit from skilled PT to complete more thorough vestibular and motor evaluation and to address impairments and functional limitations to maximize functional mobility independence and reduce falls risk but will await clearance and more specific guidance from physicians before proceeding with assessment and treatment.      History and Personal Factors relevant to plan of care:  Pt was independent, living alone, working full time - now due to progressive  weakness and dizziness pt unable to ambulate in community and requires w/c, unable to work, no caregivers available to assist pt, recent CT scans show mets to liver and t-spine; pt has not met with physician to discuss results or treatment options, anemia, anxiety, chest pain, breast cancer with chemotherapy and radiation in 2015-2016, depression, glaucoma, migraines, hyperlipidemia, hypothyroid, insomnia, LUE lymphedema, and shingles.    Clinical Presentation  Unstable    Clinical Presentation due to:  Pt was independent, living alone, working full time - now due to progressive weakness and dizziness pt unable to ambulate in community and requires w/c, unable to work, no caregivers available to assist pt, recent CT scans show mets to liver and t-spine; pt has not met with physician to discuss results or treatment options, anemia, anxiety, chest pain, breast cancer with chemotherapy and radiation in 2015-2016, depression, glaucoma, migraines, hyperlipidemia, hypothyroid, insomnia, LUE lymphedema, and shingles.    Clinical Decision Making  High    Rehab Potential  Fair    Clinical Impairments Affecting Rehab Potential  recent CT scans show mets to liver and t-spine; pt has not met with physician to discuss results or treatment options    PT Frequency  Other (comment) 1x/week x 4, 2x/week x 4 if cleared by MD    PT Duration  8 weeks    PT Treatment/Interventions  ADLs/Self Care Home Management;Canalith Repostioning;DME Instruction;Gait training;Stair training;Functional mobility training;Therapeutic activities;Therapeutic exercise;Balance training;Neuromuscular re-education;Patient/family education;Orthotic Fit/Training;Wheelchair mobility training;Energy conservation;Vestibular    PT Next Visit Plan  complete assessment of vestibular system: MSQ, positional vertigo and motor: sensation, strength, transfers, gait, balance, need for AD?     PT Home Exercise Plan  habituation, LE strength, balance     Consulted and Agree with Plan of Care  Patient;Other (Comment) need clearance from treating physicians       Patient will benefit from skilled therapeutic intervention in order to improve the following deficits and impairments:  Decreased activity tolerance, Decreased balance, Decreased endurance, Decreased mobility, Abnormal gait, Decreased strength, Difficulty walking, Dizziness, Pain  Visit Diagnosis: Dizziness and giddiness  Unsteadiness on feet  Muscle weakness (generalized)  Difficulty in walking, not elsewhere classified     Problem List Patient Active Problem List   Diagnosis Date Noted  . Shortness of breath 01/28/2018  . Weakness of both legs 01/21/2018  . Cellulitis of left elbow 01/21/2018  . Elevated liver enzymes 01/21/2018  . Lymphedema of left upper extremity 01/13/2018  . Glaucoma 01/23/2015  . Breast cancer of upper-outer quadrant of left female breast ([place]) 05/28/2014  . Hyperlipidemia 07/24/2008  . Morbid obesity ([place]) 07/24/2008  .  Hypothyroidism 11/29/2007    Rico Junker, PT, DPT 02/11/18    9:16 AM    [facility] [address] [place], [place], [address] Phone: [phone]   Fax:  [phone]  Name: Merritt Kibby MRN: [id number] Date of Birth: 1956-04-29

## 2018-02-11 NOTE — Progress Notes (Signed)
IR Bx orders placed.

## 2018-02-11 NOTE — Telephone Encounter (Signed)
This RN received call from pt stating " I just got the message that Dr Lindi Adie called me on Wednesday "  Note this RN was with  Dr Lindi Adie on Wednesday and received instructions that MD attempted to reach patient without success to discuss results. Noted areas of concern and need for biopsy for appropriate work up.  This RN informed pt of above and need request for her ok to proceed with scheduling the biopsy.  Elinda stated concern over " what is a biopsy and will it hurt"  This RN explained above including goal is to obtain biopsy in an area that is most easy to get to for least side effects"  This RN validated pt's concern as well as assessed how she was feeling per above discussion with Brielle stating " distressed "  Again validation given per her response as well as goal is to find out why she is not feeling well so we can help her feel better physically and emotionally.  " where are the abnormalites"  This RN stated " multiple areas " with Sokha asking for " like where " - This RN stated noted areas lung,lymphnodes, bone .... Addis interrupted this RN stating " I cannot hear anymore"  This RN did not state further areas but gave verbal support over distressing news, therapuetic pause given with this RN again stating goal is to diagnosis and give support.  Anaria stated yes - this RN informed her biopsy will be scheduled with MD follow up as well as communication with staff for for orders and support.  This message will be sent to MD, nurse, NP and SW for information and contact if needed.

## 2018-02-14 ENCOUNTER — Inpatient Hospital Stay: Payer: BLUE CROSS/BLUE SHIELD | Attending: Hematology and Oncology | Admitting: Adult Health

## 2018-02-14 ENCOUNTER — Encounter: Payer: Self-pay | Admitting: General Practice

## 2018-02-14 ENCOUNTER — Encounter: Payer: Self-pay | Admitting: Adult Health

## 2018-02-14 ENCOUNTER — Ambulatory Visit: Payer: BLUE CROSS/BLUE SHIELD | Admitting: Physical Therapy

## 2018-02-14 VITALS — BP 129/84 | HR 86 | Temp 97.9°F | Resp 16 | Ht 65.0 in | Wt 259.4 lb

## 2018-02-14 DIAGNOSIS — C50412 Malignant neoplasm of upper-outer quadrant of left female breast: Secondary | ICD-10-CM

## 2018-02-14 DIAGNOSIS — C7951 Secondary malignant neoplasm of bone: Secondary | ICD-10-CM | POA: Diagnosis not present

## 2018-02-14 DIAGNOSIS — C773 Secondary and unspecified malignant neoplasm of axilla and upper limb lymph nodes: Secondary | ICD-10-CM | POA: Insufficient documentation

## 2018-02-14 DIAGNOSIS — Z170 Estrogen receptor positive status [ER+]: Secondary | ICD-10-CM | POA: Diagnosis not present

## 2018-02-14 NOTE — Assessment & Plan Note (Signed)
Metastatic breast cancer in the left breast involving left axillary lymph nodes and isolated L1 vertebral metastases stage IV disease, ER 100 percent, PR 4%, HER-2 negative ratio 1.3 Ki-67 15%  Chemotherapy summary: Completed dose dense Adriamycin Cytoxan 4 started 07/26/2014. Completed 12 weeks of Taxol by 12/18/2014. Left lumpectomy 01/30/2015: Invasive ductal carcinoma grade 2, 1.2 cm, 12/15 lymph nodes positive, extracapsular tumor extension present, T1c N3M1 stage IV Status post radiation completed 04/24/2015  Current treatment: Anastrozole since July 2016 discontinued December 2016, switched to tamoxifen 06/13/2017and was unable to tolerate it. She stopped it shortly thereafter, and has been on observation since then.    Kimberly Reed has a CT scan from 02/09/2018 concerning for cancer metastases in her bone, lymph nodes, lung, and liver.  She and I reviewed this briefly.  She then met with Dr. Jana Hakim who reviewed the concerns and that she will need a biopsy, which is already scheduled on Feb 24, 2018.  She will review those results with Dr. Lindi Adie on 02/28/2018.  The main thing is to see what her receptors are, in order to guide treatment planning.   Peytin felt much better after talking to Dr. Jana Hakim, and will return as scheduled.

## 2018-02-14 NOTE — Progress Notes (Addendum)
[facility] Follow up:    Ma Hillock, DO [address] [place] [address]   DIAGNOSIS: Cancer Staging Breast cancer of upper-outer quadrant of left female breast ([place]) Staging form: Breast, AJCC 7th Edition - Clinical: Stage IV (T2, N1, M1) - Signed by Rulon Eisenmenger, MD on 08/16/2014 Staging comments: Staged at breast conference 06/06/14.  - Pathologic stage from 02/01/2015: Stage IV (yT1c, N3, M1) - Signed by Enid Cutter, MD on 03/07/2015 Staging comments: Staged on final lumpectomy specimen by Dr. Donato Heinz.   SUMMARY OF ONCOLOGIC HISTORY:   Breast cancer of upper-outer quadrant of left female breast ([place])   05/18/2014 Mammogram    Suspicious left upper outer quadrant 5 cm segmental area of calcifications.       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      05/30/2014 Breast MRI    Left Breast: 12oclock: 21 x 23 x 30 mm, Numerous  level 1 and 2 LN; Liver lesions cycts by Liver MRI      06/07/2014 Initial Biopsy    Left axillary lymph node biopsy invasive ductal carcinoma ER 100%, PR 11% Ki-67 15% HER-2 negative ratio 1.41      06/13/2014 PET scan    left breast activity, hypermetabolic left axillary and left retropectoral lymph node, small lucent lesion in L1 vertebra which was biopsied and proven to be bone metastases      07/19/2014 Initial Biopsy    Biopsy of L1 vertebra metastatic carcinoma breast primary, ER 100%, PR 4%, HER-2 negative ratio 1.3      07/26/2014 - 12/18/2014 Neo-Adjuvant Chemotherapy    Even though she has L1 solitary bone metastases, patient is being treated definitively with neoadjuvant chemotherapy with dose dense Adriamycin and Cytoxan to be followed by weekly Taxol x12      12/25/2014 PET scan    Interval improvement in size and metabolic activity associated with left axillary lymph nodes consistent with treatment response, no  hypermetabolic distant metastases, L1 vertebral body has no activity      12/25/2014 Breast MRI    No residual enhancement left breast, decreased left axillary lymphadenopathy, indicating response to treatment but lymph nodes are still present      01/30/2015 Surgery    Left breast lumpectomy: Invasive ductal carcinoma grade 2, 1.2 cm, 12/15 lymph nodes positive, extracapsular tumor extension present, T1c N3M1 stage IV       03/12/2015 - 04/24/2015 Radiation Therapy     adjuvant radiation therapy with Dr. Isidore Moos      05/16/2015 - 05/2016 Anti-estrogen oral therapy    Anastrozole 1 mg daily 10 years is the plan; [name] was recommended but she was not reachable and hence it was not filled, switched to tamoxifen 04/07/2016.  Was unable to tolerate Tamoxifen and stopped at some point in 2017, she isn't sure when.        01/10/2016 Imaging    CT chest abdomen pelvis: No evidence of metastatic disease       CURRENT THERAPY: observation  INTERVAL HISTORY: Uri Renaldo Fiddler 62 y.o. female returns for evaluation.  She recently underwent scans concerning for disease progression in the lymph nodes, liver, lung and bone.  She is accompanied by our chaplain, Lorrin Jackson for her appointment today.  Lynsie says that she has had a time with vertigo.  She says she has blamed all of her issues on the vertigo.  She has been bloated and mildly nauseated.  She denies any new pain.  She says she isn't sure how much she wants to talk and hear today due to her anxiety.  She is under the impression that she is starting treatment today.     Patient Active Problem List   Diagnosis Date Noted  . Shortness of breath 01/28/2018  . Weakness of both legs 01/21/2018  . Cellulitis of left elbow 01/21/2018  . Elevated liver enzymes 01/21/2018  . Lymphedema of left upper extremity 01/13/2018  . Glaucoma 01/23/2015  . Breast cancer of upper-outer quadrant of left female breast ([place]) 05/28/2014  . Hyperlipidemia 07/24/2008   . Morbid obesity ([place]) 07/24/2008  . Hypothyroidism 11/29/2007    is allergic to other; effexor [venlafaxine]; and mucinex [guaifenesin er].  MEDICAL HISTORY: Past Medical History:  Diagnosis Date  . Anemia   . Anxiety   . Atypical chest pain   . Breast cancer ([place]) 05/2014   left. completed chemo 10/2014. Did not tolerate anti-estrogen.   . Cancer ([place])    left breast dx. 3'50- chemo planned to start 07-09-14- Dr. Lindi Adie, [facility] follows  . Depression   . Family history of heart disease   . Glaucoma   . Gluten intolerance    Dr. Collene Mares  . Headache syndrome 10/13/2016  . History of syncope 2008   loss of bladder control eval by Neuro  . Hot flashes   . Hyperlipemia   . Hypothyroid   . Insomnia   . Lymphedema    L arm  . Migraine without aura   . Personal history of chemotherapy   . Personal history of radiation therapy   . Shingles 2018  . Wears glasses     SURGICAL HISTORY: Past Surgical History:  Procedure Laterality Date  . BREAST BIOPSY Left 06/07/2014   malignant  . BREAST BIOPSY Left 05/24/2014   malignant  . BREAST LUMPECTOMY Left 01/30/2015   malignant  . BREAST LUMPECTOMY WITH NEEDLE LOCALIZATION AND AXILLARY LYMPH NODE DISSECTION Left 01/30/2015   Procedure: BREAST LUMPECTOMY WITH NEEDLE LOCALIZATION LEFT AXILLARY DISSECTION REMOVAL OF PORT;  Surgeon: [name], MD;  Location: [facility];  Service: General;  Laterality: Left;  . CATARACT EXTRACTION Left   . DILATION AND CURETTAGE OF UTERUS    . ESOPHAGOGASTRODUODENOSCOPY  06/01/14  . EXAMINATION UNDER ANESTHESIA  02/24/2013   Procedure: EXAM UNDER ANESTHESIA;  Surgeon: Azalia Bilis, MD;  Location: [facility];  Service: Gynecology;;  with removal of prolapsing cervical mass; pap smear and endometrial biopsy  . GLAUCOMA SURGERY Left    x1   . PORTACATH PLACEMENT Right 07/06/2014   Procedure: PORT A CATH  PLACEMENT;  Surgeon: [name], MD;  Location: [facility];  Service:  General;  Laterality: Right;  . TONSILLECTOMY     child  . uterine polyp removed     per hysteroscopy about 1 1/2 yrs ago.    SOCIAL HISTORY: Social History   Socioeconomic History  . Marital status: Single    Spouse name: Not on file  . Number of children: 0  . Years of education: Some college  . Highest education level: Not on file  Occupational History  . 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 Financial resource strain: Not on file  . Food insecurity:    Worry: Not on file    Inability: Not on file  .  Transportation needs:    Medical: Not on file    Non-medical: Not on file  Tobacco Use  . Smoking status: Never Smoker  . Smokeless tobacco: Never Used  Substance and Sexual Activity  . Alcohol use: No  . Drug use: No    Comment: in 20's but not now  . Sexual activity: Not Currently    Comment: menarche age 3, P 0, no HRT  Lifestyle  . Physical activity:    Days per week: Not on file    Minutes per session: Not on file  . Stress: Not on file  Relationships  . Social connections:    Talks on phone: Not on file    Gets together: Not on file    Attends religious service: Not on file    Active member of club or organization: Not on file    Attends meetings of clubs or organizations: Not on file    Relationship status: Not on file  . Intimate partner violence:    Fear of current or ex partner: Not on file    Emotionally abused: Not on file    Physically abused: Not on file    Forced sexual activity: Not on file  Other Topics Concern  . Not on file  Social History Narrative   Single.  College.  Lives alone.  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   Right-handed   Dairy products, takes a daily vitamin, drinks caffeine, uses herbal remedies.   Alarm at home.  Wears her seatbelt.       FAMILY HISTORY: Family History  Problem Relation Age of Onset  . Kidney disease  Mother   . Depression Mother   . Early death Mother   . Hypertension Mother   . Heart attack Mother   . Aortic aneurysm Mother   . Hypertension Father   . Dementia Father   . Depression Father   . Kidney disease Brother   . Hypertension Sister   . Heart disease Unknown        "all of moms side"  . Depression Unknown   . Heart failure Cousin     Review of Systems  Constitutional: Positive for fatigue. Negative for appetite change, chills, fever and unexpected weight change.  HENT:   Negative for hearing loss and lump/mass.   Eyes: Negative for eye problems and icterus.  Respiratory: Negative for chest tightness, cough and shortness of breath.   Cardiovascular: Negative for chest pain, leg swelling and palpitations.  Gastrointestinal: Positive for abdominal distention and nausea. Negative for abdominal pain, blood in stool, constipation, diarrhea and vomiting.  Neurological: Positive for dizziness (h/o vertigo). Negative for extremity weakness, headaches and numbness.  Hematological: Negative for adenopathy.  Psychiatric/Behavioral: Negative for depression. The patient is nervous/anxious.       PHYSICAL EXAMINATION  ECOG PERFORMANCE STATUS: 2 - Symptomatic, <50% confined to bed  Vitals:   02/14/18 1335  BP: 129/84  Pulse: 86  Resp: 16  Temp: 97.9 F (36.6 C)  SpO2: 98%    Physical Exam  Constitutional: She is oriented to person, place, and time and well-developed, well-nourished, and in no distress.  Obese woman sitting in wheelchair   HENT:  Head: Normocephalic and atraumatic.  Mouth/Throat: Oropharynx is clear and moist. No oropharyngeal exudate.  Eyes: Pupils are equal, round, and reactive to light. No scleral icterus.  Neck: Neck supple.  Cardiovascular: Normal rate, regular rhythm, normal heart sounds and intact distal pulses.  Pulmonary/Chest: Effort normal  and breath sounds normal.  Abdominal: Soft. Bowel sounds are normal.  Difficult exam due to patient  body habitus and that she is examined in her wheelchair  Musculoskeletal: She exhibits no edema.  Lymphadenopathy:    She has no cervical adenopathy.  Neurological: She is alert and oriented to person, place, and time.  Psychiatric: Mood and affect normal.    LABORATORY DATA:  CBC    Component Value Date/Time   WBC 10.1 02/01/2018 1120   RBC 3.97 02/01/2018 1120   HGB 11.7 (L) 02/01/2018 1120   HGB 12.7 01/11/2018 1202   HGB 12.9 01/10/2016 1538   HCT 35.6 (L) 02/01/2018 1120   HCT 38.9 01/10/2016 1538   PLT 424.0 (H) 02/01/2018 1120   PLT 409 (H) 01/11/2018 1202   PLT 285 01/10/2016 1538   MCV 89.8 02/01/2018 1120   MCV 90.9 01/10/2016 1538   MCH 28.9 01/21/2018 2127   MCHC 32.8 02/01/2018 1120   RDW 17.1 (H) 02/01/2018 1120   RDW 15.1 (H) 01/10/2016 1538   LYMPHSABS 2.3 02/01/2018 1120   LYMPHSABS 1.7 01/10/2016 1538   MONOABS 0.8 02/01/2018 1120   MONOABS 0.5 01/10/2016 1538   EOSABS 0.3 02/01/2018 1120   EOSABS 0.1 01/10/2016 1538   BASOSABS 0.2 (H) 02/01/2018 1120   BASOSABS 0.0 01/10/2016 1538    CMP     Component Value Date/Time   NA 134 (L) 01/21/2018 2127   NA 141 08/10/2017   NA 141 01/10/2016 1539   K 4.1 01/21/2018 2127   K 3.8 01/10/2016 1539   CL 101 01/21/2018 2127   CO2 19 (L) 01/21/2018 2127   CO2 29 01/10/2016 1539   GLUCOSE 93 01/21/2018 2127   GLUCOSE 115 01/10/2016 1539   BUN 14 01/21/2018 2127   BUN 18 08/10/2017   BUN 18.1 01/10/2016 1539   CREATININE 0.93 01/21/2018 2127   CREATININE 1.16 (H) 01/11/2018 1202   CREATININE 1.0 01/10/2016 1539   CALCIUM 9.4 01/21/2018 2127   CALCIUM 10.1 01/10/2016 1539   PROT 7.6 01/12/2018 2347   PROT 7.4 01/10/2016 1539   ALBUMIN 2.7 (L) 01/12/2018 2347   ALBUMIN 4.1 01/10/2016 1539   AST 86 (H) 01/12/2018 2347   AST 90 (H) 01/11/2018 1202   AST 15 01/10/2016 1539   ALT 38 01/12/2018 2347   ALT 41 01/11/2018 1202   ALT 17 01/10/2016 1539   ALKPHOS 322 (H) 01/12/2018 2347   ALKPHOS 92  01/10/2016 1539   BILITOT 1.0 01/12/2018 2347   BILITOT 1.4 (H) 01/11/2018 1202   BILITOT 0.50 01/10/2016 1539   GFRNONAA >60 01/21/2018 2127   GFRNONAA 49 (L) 01/11/2018 1202   GFRAA >60 01/21/2018 2127   GFRAA 57 (L) 01/11/2018 1202     ASSESSMENT and PLAN:   Breast cancer of upper-outer quadrant of left female breast ([place]) Metastatic breast cancer in the left breast involving left axillary lymph nodes and isolated L1 vertebral metastases stage IV disease, ER 100 percent, PR 4%, HER-2 negative ratio 1.3 Ki-67 15%  Chemotherapy summary: Completed dose dense Adriamycin Cytoxan 4 started 07/26/2014. Completed 12 weeks of Taxol by 12/18/2014. Left lumpectomy 01/30/2015: Invasive ductal carcinoma grade 2, 1.2 cm, 12/15 lymph nodes positive, extracapsular tumor extension present, T1c N3M1 stage IV Status post radiation completed 04/24/2015  Current treatment: Anastrozole since July 2016 discontinued December 2016, switched to tamoxifen 06/13/2017and was unable to tolerate it. She stopped it shortly thereafter, and has been on observation since then.    Niasha has a  CT scan from 02/09/2018 concerning for cancer metastases in her bone, lymph nodes, lung, and liver.  She and I reviewed this briefly.  She then met with Dr. Jana Hakim who reviewed the concerns and that she will need a biopsy, which is already scheduled on Feb 24, 2018.  She will review those results with Dr. Lindi Adie on 02/28/2018.  The main thing is to see what her receptors are, in order to guide treatment planning.   Kindal felt much better after talking to Dr. Jana Hakim, and will return as scheduled.     All questions were answered. The patient knows to call the clinic with any problems, questions or concerns. We can certainly see the patient much sooner if necessary.  This note was electronically signed.  Scot Dock, NP 02/14/2018    ADDENDUM: This 62 year old [place] woman with metastatic estrogen receptor positive  breast cancer has been off treatment for approximately 2 years.  She now has significant disease progression in the liver and is scheduled for liver biopsy.  She was very emotional today.  I discussed the fact that we need to know that this is cancer and that this is estrogen receptor positive and if so there are many treatment options for her.  Though I did not discuss those options, of course fulvestrant and palbociclib would be high on the list if we opted for antiestrogens.  If this cancer has become estrogen receptor negative and capecitabine would be an option  We also discussed the procedure for liver biopsy and she was reassured that she will not need to be under general anesthesia for that.  By the end of the meeting the patient was encouraged that she would be able to tolerate the liver biopsy and ready to discuss options based on those results with Dr. Lindi Adie when he returns  I personally saw this patient and performed a substantive portion of this encounter with the listed APP documented above.   Chauncey Cruel, MD Medical Oncology and Hematology [facility] [address] [place], [place] [address] Tel. [phone]    Fax. [phone]

## 2018-02-14 NOTE — Progress Notes (Signed)
[facility] Spiritual Care Note  Connected with Kimberly Reed prior to her appt with Kimberly Reed/NP, accompanying her through appt per her request, and staying afterward to debrief. She verbalized that she deals with depression and anxiety separate from other health issues and noticed today that she was jumping to the worst conclusions in order to "prepare" herself for what she might hear. She also identified that she has transferrable skills from her work in [employer]; she can apply the same tools that she uses with customers to help herself focus on one step at a time--and on the details that are known rather than worst-case scenario fears.  Provided pastoral presence, emotional support, and empathic listening while also serving as a Scientist, product/process development between pt and NP. Kimberly Reed verbalized gratitude for pastoral presence and help with clarifying her questions and what she was hearing from NP (and MD when Kimberly Reed came briefly in Kimberly Reed stead). Because this arrangement was so helpful for Kimberly Reed, we plan for me to join her in her 5/6 appt with Kimberly Reed, as she hears biopsy results and discusses treatment plan.  Kimberly Reed has my number and knows to call anytime for emotional support between now and then.   [place], [place], [facility] Pager [phone] Voicemail [phone]

## 2018-02-15 ENCOUNTER — Encounter: Payer: Self-pay | Admitting: General Practice

## 2018-02-15 ENCOUNTER — Telehealth: Payer: Self-pay | Admitting: Adult Health

## 2018-02-15 ENCOUNTER — Telehealth: Payer: Self-pay

## 2018-02-15 LAB — TSH: TSH: 7.04 — AB (ref 0.41–5.90)

## 2018-02-15 NOTE — Telephone Encounter (Signed)
Per 4/22 no los °

## 2018-02-15 NOTE — Telephone Encounter (Signed)
Called pt to return her VM with concerns of conflicting information. Pt states that she would like to directly speak with Val,RN regarding their discussion last week about pt possibly starting on a chemo pill/injection. Pt stated that she did not receive any of the chemo information during her recent appt with Lindsey,NP/Dr.Magrinat yesterday.  Attempted to clarify pt concerns over the phone on what the plan would be. Pt sounded upset and frustrated and states " I don't want to speak with someone who was not there last week during my conversations with Val. I would like to speak with her when she gets back, since she had direct information with Dr.Gudena." Again, attempted to clarify some information regarding Dr.Gudena's note, but pt did not want to speak with this RN anymore at this time. Did not make 3rd attempt to try and clarify information with pt, since she was clearly upset already.   Notified Lindsey,NP and called Lorrin Jackson [facility]), since she was present with pt, during her appt yesterday with Lindsey,NP. Will forward this request for call back to [facility] for further clarification with pt.

## 2018-02-15 NOTE — Progress Notes (Signed)
[facility] Spiritual Care Note  Phoned Fe for support following her appointment yesterday and concerned call to May Armel/RN, Dr Geralyn Flash nurse, today. Per pt, her central concern was perceiving a conflict of information between what she heard during the initial call to schedule her for yesterday's appt and what she heard at the appointment itself. Served as a reflection partner as Symphonie parsed out what she heard from [employer] Causey/NP and Dr Jana Hakim yesterday. (Yesterday they clarified why the liver biopsy is the key to determining what treatment she will need.) Assured her that I passed along her questions for Dr Lindi Adie via inbasket message and that I will accompany her to her 5/6 appt with him.   Overall, Nawal states that she he is doing "better" today and slept well last night ("a big improvement" over recent insomnia). She states that she is "putting it [her questions and anxiety about dx/tx] aside" and "taking it one step at a time" as best she can to help manage her own anxiety. Per pt, she has already arranged for her friend Judeen Hammans from church ("an Building services engineer who has helped me with all of this") to be her driver for the biopsy.  Saraiya knows to contact [place] or breast center team for support at any time. Will continue to follow for support, but please also page if immediate needs arise. Thank you.   [place], [place], [facility] Pager [phone] Voicemail [phone]

## 2018-02-15 NOTE — Telephone Encounter (Signed)
Noted.  At this pont, wait to see if she calls back before proceeding. It appears we now have some answers for weakness, since she has metastases  of her breast cancer to bone, liver, lymph nodes and lungs.  Further treatment on weakness will be deferred to her oncology team for recommendations.

## 2018-02-15 NOTE — Telephone Encounter (Signed)
Thank you. At this time we will defer any PT treatment to her oncology team to guide [place] on, since her work up will guided by them.

## 2018-02-16 ENCOUNTER — Encounter (HOSPITAL_BASED_OUTPATIENT_CLINIC_OR_DEPARTMENT_OTHER): Payer: BLUE CROSS/BLUE SHIELD

## 2018-02-16 ENCOUNTER — Ambulatory Visit (HOSPITAL_COMMUNITY): Payer: BLUE CROSS/BLUE SHIELD

## 2018-02-16 ENCOUNTER — Telehealth: Payer: Self-pay | Admitting: *Deleted

## 2018-02-16 NOTE — Telephone Encounter (Signed)
This RN returned call to pt per her request to " speak with Kimberly Reed states she feels " there is a discrepancy between what you told me on Friday and then when I came in Monday "  This RN validated pt's concerns as well as fact that this RN spoke with Dr Lindi Adie prior to his leave and was told to inform pt she will need a biopsy to proceed with plan therapy. Dr Lindi Adie had informed another nurse of plan of therapy once biopsy obtained.  Kimberly Reed verbalized understanding and stated " ok - that's all - thank you " .

## 2018-02-21 ENCOUNTER — Encounter: Payer: BLUE CROSS/BLUE SHIELD | Admitting: Physical Therapy

## 2018-02-21 ENCOUNTER — Telehealth: Payer: Self-pay

## 2018-02-21 ENCOUNTER — Other Ambulatory Visit: Payer: Self-pay | Admitting: Neurology

## 2018-02-21 ENCOUNTER — Telehealth: Payer: Self-pay | Admitting: Neurology

## 2018-02-21 DIAGNOSIS — G4733 Obstructive sleep apnea (adult) (pediatric): Secondary | ICD-10-CM

## 2018-02-21 DIAGNOSIS — R0683 Snoring: Secondary | ICD-10-CM

## 2018-02-21 NOTE — Telephone Encounter (Signed)
-----   Message from Larey Seat, MD sent at 02/21/2018  8:41 AM EDT ----- Given the high AHI - 33.7/h. This patient needs to return for an attended sleep study CPAP titration.

## 2018-02-21 NOTE — Addendum Note (Signed)
Addended by: Larey Seat on: 02/21/2018 08:41 AM   Modules accepted: Orders

## 2018-02-21 NOTE — Procedures (Signed)
[facility] @[facility] [place], [place] [address] NAME:   Kimberly Reed                                                                  DOB: 26-May-1956 MEDICAL RECORD NUMBER  [id number]                                                   DOS: 02/02/2018 REFERRING PHYSICIAN: Jill Alexanders, M.D.  STUDY PERFORMED: Home Sleep Study on Apnea Link HISTORY:   Kimberly Reed is a 62 y.o. female patient of Dr. Jannifer Franklin' referred for a new sleep apnea evaluation. Chief complaint according to patient: "I had insomnia last year, not now, but I may have apnea". Last year she reported to have had chronic insomnia for many months, she did experience limited daytime functioning at work, but she feels cognitively more impaired now than during the insomnia problem period. Patient appears unfocussed and distracted.  She did not fill any paper work out, and she is on her phone calling her work place while in exam room. Sleep habits: She feels groggy, not restored not refreshed. Due to lymphedema in her left arm she has also problems finding a comfortable sleep position. She wakes up with headaches but the headaches do not wake her. She wears an elastic sleeve. At night she has a Velcro brace on her left arm, and she feels as if the time of beginning to use a Velcro brace has been the time when she began to have vivid dreams, strange and sometimes lucid dreams. BMI 48.8.        STUDY RESULTS:  Total Recording Time: 11 hours, 16 minutes- only 5 h and 62 m of valid test time.  Total Apnea/Hypopnea Index (AHI): 35.3/h; and RDI: 33.7 /h. Average Oxygen Saturation: 92% Spo2; Lowest Oxygen Desaturation:  68%  Total Time in Oxygen Saturation below 89% was 45 minutes  Average Heart Rate:  65 bpm (between 43 and 107 bpm). IMPRESSION: Moderate severe Obstructive Sleep Apnea with prolonged hypoxemia.  RECOMMENDATION: I recommend a return for CPAP titration. I certify that I have reviewed the raw  data recording prior to the issuance of this report in accordance with the standards of the American Academy of Sleep Medicine (AASM). Larey Seat, M.D.  02-21-2018     Medical Director of [facility], accredited by the AASM. Diplomat of the ABPN and ABSM.

## 2018-02-21 NOTE — Telephone Encounter (Signed)
[employer] denied cpap titration, need Auto ordered

## 2018-02-21 NOTE — Telephone Encounter (Signed)
I called pt. I advised pt that Dr. Brett Fairy reviewed their sleep study results and found that pt has moderate to severe sleep apnea. Dr. Brett Fairy recommends that pt have a CPAP titration but insurance has denied that.  I reviewed PAP compliance expectations with the pt. Pt is agreeable to starting a CPAP. I advised pt that an order will be sent to a DME, [facility], and [facility] will call the pt within about one week after they file with the pt's insurance. [facility] will show the pt how to use the machine, fit for masks, and troubleshoot the CPAP if needed. A follow up appt was made for insurance purposes with Cecille Rubin NP on Nov 27, 2017 at 9:00 am. Pt verbalized understanding to arrive 15 minutes early and bring their CPAP. A letter with all of this information in it will be mailed to the pt as a reminder. I verified with the pt that the address we have on file is correct. Pt verbalized understanding of results. Pt had no questions at this time but was encouraged to call back if questions arise.

## 2018-02-22 ENCOUNTER — Telehealth: Payer: Self-pay

## 2018-02-22 ENCOUNTER — Telehealth: Payer: Self-pay | Admitting: *Deleted

## 2018-02-22 NOTE — Telephone Encounter (Signed)
Returned pt call and left VM regarding concerns with continuing PT. As of now there are no contraindications with her participating in physical therapy and may continue with her appts. She is to call back with any questions.  Cyndia Bent RN

## 2018-02-22 NOTE — Telephone Encounter (Signed)
Voicemail received requesting "To leave message for Dr. Geralyn Flash nurse.  Received news last Friday.  I went to my PT appointment for my arm.  I told her about the call. News.  PT researched it and said that based on the fact that if I have cancer we should do PT based on her research, contraindications and to speak to my doctor about it.  Need Dr. Geralyn Flash approval.  Do we continue PT or stop it?  Call me with information [phone]."  Message forwarded to [facility] for further patient communication.

## 2018-02-23 ENCOUNTER — Other Ambulatory Visit: Payer: Self-pay | Admitting: Radiology

## 2018-02-24 ENCOUNTER — Ambulatory Visit (HOSPITAL_COMMUNITY)
Admission: RE | Admit: 2018-02-24 | Discharge: 2018-02-24 | Disposition: A | Discharge status: Home / Self Care | Payer: BLUE CROSS/BLUE SHIELD | Source: Ambulatory Visit | Attending: Hematology and Oncology | Admitting: Hematology and Oncology

## 2018-02-24 ENCOUNTER — Encounter (HOSPITAL_COMMUNITY): Payer: Self-pay

## 2018-02-24 DIAGNOSIS — R0602 Shortness of breath: Secondary | ICD-10-CM | POA: Diagnosis not present

## 2018-02-24 DIAGNOSIS — C7951 Secondary malignant neoplasm of bone: Secondary | ICD-10-CM | POA: Diagnosis not present

## 2018-02-24 DIAGNOSIS — R918 Other nonspecific abnormal finding of lung field: Secondary | ICD-10-CM | POA: Diagnosis not present

## 2018-02-24 DIAGNOSIS — F329 Major depressive disorder, single episode, unspecified: Secondary | ICD-10-CM | POA: Insufficient documentation

## 2018-02-24 DIAGNOSIS — F419 Anxiety disorder, unspecified: Secondary | ICD-10-CM | POA: Diagnosis not present

## 2018-02-24 DIAGNOSIS — N200 Calculus of kidney: Secondary | ICD-10-CM | POA: Diagnosis not present

## 2018-02-24 DIAGNOSIS — G4700 Insomnia, unspecified: Secondary | ICD-10-CM | POA: Insufficient documentation

## 2018-02-24 DIAGNOSIS — C50412 Malignant neoplasm of upper-outer quadrant of left female breast: Secondary | ICD-10-CM

## 2018-02-24 DIAGNOSIS — H409 Unspecified glaucoma: Secondary | ICD-10-CM | POA: Diagnosis not present

## 2018-02-24 DIAGNOSIS — Z853 Personal history of malignant neoplasm of breast: Secondary | ICD-10-CM | POA: Insufficient documentation

## 2018-02-24 DIAGNOSIS — C787 Secondary malignant neoplasm of liver and intrahepatic bile duct: Secondary | ICD-10-CM | POA: Diagnosis not present

## 2018-02-24 DIAGNOSIS — R140 Abdominal distension (gaseous): Secondary | ICD-10-CM | POA: Diagnosis not present

## 2018-02-24 DIAGNOSIS — E039 Hypothyroidism, unspecified: Secondary | ICD-10-CM | POA: Diagnosis not present

## 2018-02-24 DIAGNOSIS — Z9221 Personal history of antineoplastic chemotherapy: Secondary | ICD-10-CM | POA: Insufficient documentation

## 2018-02-24 DIAGNOSIS — E785 Hyperlipidemia, unspecified: Secondary | ICD-10-CM | POA: Insufficient documentation

## 2018-02-24 DIAGNOSIS — Z923 Personal history of irradiation: Secondary | ICD-10-CM | POA: Insufficient documentation

## 2018-02-24 DIAGNOSIS — R160 Hepatomegaly, not elsewhere classified: Secondary | ICD-10-CM | POA: Diagnosis present

## 2018-02-24 DIAGNOSIS — Z8249 Family history of ischemic heart disease and other diseases of the circulatory system: Secondary | ICD-10-CM | POA: Insufficient documentation

## 2018-02-24 LAB — CBC
HCT: 37.6 % (ref 36.0–46.0)
HEMOGLOBIN: 12.6 g/dL (ref 12.0–15.0)
MCH: 29.8 pg (ref 26.0–34.0)
MCHC: 33.5 g/dL (ref 30.0–36.0)
MCV: 88.9 fL (ref 78.0–100.0)
Platelets: 453 10*3/uL — ABNORMAL HIGH (ref 150–400)
RBC: 4.23 MIL/uL (ref 3.87–5.11)
RDW: 19.3 % — ABNORMAL HIGH (ref 11.5–15.5)
WBC: 9.8 10*3/uL (ref 4.0–10.5)

## 2018-02-24 LAB — PROTIME-INR
INR: 1.02
Prothrombin Time: 13.3 seconds (ref 11.4–15.2)

## 2018-02-24 MED ORDER — MIDAZOLAM HCL 2 MG/2ML IJ SOLN
INTRAMUSCULAR | Status: AC | PRN
  Administered 2018-02-24 (×2): 1 mg via INTRAVENOUS

## 2018-02-24 MED ORDER — LIDOCAINE HCL (PF) 1 % IJ SOLN
INTRAMUSCULAR | Status: AC
  Filled 2018-02-24: qty 30

## 2018-02-24 MED ORDER — GELATIN ABSORBABLE 12-7 MM EX MISC
CUTANEOUS | Status: AC
  Filled 2018-02-24: qty 1

## 2018-02-24 MED ORDER — FENTANYL CITRATE (PF) 100 MCG/2ML IJ SOLN
INTRAMUSCULAR | Status: AC
  Filled 2018-02-24: qty 4

## 2018-02-24 MED ORDER — SODIUM CHLORIDE 0.9 % IV SOLN: INTRAVENOUS | Status: DC

## 2018-02-24 MED ORDER — FENTANYL CITRATE (PF) 100 MCG/2ML IJ SOLN
INTRAMUSCULAR | Status: AC | PRN
  Administered 2018-02-24 (×2): 50 ug via INTRAVENOUS

## 2018-02-24 MED ORDER — MIDAZOLAM HCL 2 MG/2ML IJ SOLN
INTRAMUSCULAR | Status: AC
  Filled 2018-02-24: qty 4

## 2018-02-24 NOTE — Sedation Documentation (Signed)
bandaid r flank

## 2018-02-24 NOTE — Progress Notes (Signed)
Lab called to say PT/INR was hemolyzed and needed to be redrawn. Pt is already at the nurses station in radiology.  I called and soke with Marele,RN who states she understands that it needs to be redrawn

## 2018-02-24 NOTE — H&P (Signed)
Chief Complaint: Patient was seen in consultation today for liver lesion biopsy at the request of Kimberly Reed  Referring Physician(s): Gudena,Vinay  Supervising Physician: Corrie Mckusick  Patient Status: [facility] - Out-pt  History of Present Illness: Kimberly Reed is a 62 y.o. female *  Hx Breast Ca 2015 SOB; Abd pain/bloating  4/22 note Oncology Kimberly Reed has a CT scan from 02/09/2018 concerning for cancer metastases in her bone, lymph nodes, lung, and liver.  She and I reviewed this briefly.  She then met with Dr. Jana Hakim who reviewed the concerns and that she will need a biopsy, which is already scheduled on Feb 24, 2018.  She will review those results with Dr. Lindi Adie on 02/28/2018.  The main thing is to see what her receptors are, in order to guide treatment planning.   CT 4/17: IMPRESSION: 1. Extensive relatively diffuse hepatic metastasis. Secondary hepatomegaly. 2. Osseous metastasis, most apparent in the thoracic spine. 3. Decreased sensitivity and specificity exam due to technique related factors, as described above. 4. Nonspecific pulmonary nodules. Some were present on the prior and can be presumed benign. Others are not readily apparent on the prior but may have been obscured by thicker slice collimation on that study. Recommend attention on follow-up. 5. Enlargement of lower periesophageal nodes which are suspicious for metastatic disease. 6. Left nephrolithiasis.  Now scheduled for liver lesion biopsy  Past Medical History:  Diagnosis Date  . Anemia   . Anxiety   . Atypical chest pain   . Breast cancer ([place]) 05/2014   left. completed chemo 10/2014. Did not tolerate anti-estrogen.   . Cancer ([place])    left breast dx. 2'72- chemo planned to start 07-09-14- Dr. Lindi Adie, [facility] follows  . Depression   . Family history of heart disease   . Glaucoma   . Gluten intolerance    Dr. Collene Mares  . Headache syndrome 10/13/2016  . History of syncope 2008   loss of bladder  control eval by Neuro  . Hot flashes   . Hyperlipemia   . Hypothyroid   . Insomnia   . Lymphedema    L arm  . Migraine without aura   . Personal history of chemotherapy   . Personal history of radiation therapy   . Shingles 2018  . Wears glasses     Past Surgical History:  Procedure Laterality Date  . BREAST BIOPSY Left 06/07/2014   malignant  . BREAST BIOPSY Left 05/24/2014   malignant  . BREAST LUMPECTOMY Left 01/30/2015   malignant  . BREAST LUMPECTOMY WITH NEEDLE LOCALIZATION AND AXILLARY LYMPH NODE DISSECTION Left 01/30/2015   Procedure: BREAST LUMPECTOMY WITH NEEDLE LOCALIZATION LEFT AXILLARY DISSECTION REMOVAL OF PORT;  Surgeon: [name], MD;  Location: [facility];  Service: General;  Laterality: Left;  . CATARACT EXTRACTION Left   . DILATION AND CURETTAGE OF UTERUS    . ESOPHAGOGASTRODUODENOSCOPY  06/01/14  . EXAMINATION UNDER ANESTHESIA  02/24/2013   Procedure: EXAM UNDER ANESTHESIA;  Surgeon: Azalia Bilis, MD;  Location: [facility];  Service: Gynecology;;  with removal of prolapsing cervical mass; pap smear and endometrial biopsy  . GLAUCOMA SURGERY Left    x1   . PORTACATH PLACEMENT Right 07/06/2014   Procedure: PORT A CATH  PLACEMENT;  Surgeon: [name], MD;  Location: [facility];  Service: General;  Laterality: Right;  . TONSILLECTOMY     child  . uterine polyp removed     per hysteroscopy about 1 1/2 yrs ago.  Allergies: Other; Effexor [venlafaxine]; and Mucinex [guaifenesin er]  Medications: Prior to Admission medications   Medication Sig Start Date End Date Taking? Authorizing Provider  acetaminophen (TYLENOL) 325 MG tablet Take 2 tablets (650 mg total) by mouth every 6 (six) hours as needed for mild pain or moderate pain (or Fever >/= 101). 01/14/18  Yes Adhikari, Amrit, MD  brimonidine-timolol (COMBIGAN) 0.2-0.5 % ophthalmic solution Place 1 drop into both eyes every 12 (twelve) hours.    Yes [provider]  dorzolamide  (TRUSOPT) 2 % ophthalmic solution Place 1 drop into both eyes 2 (two) times daily.    Yes [provider]  levothyroxine (SYNTHROID, LEVOTHROID) 150 MCG tablet TAKE 1 TABLET BY MOUTH EVERY DAY ON EMPTY STOMACH IN THE MORNING 12/29/17  Yes [provider]  Omega-3 Fatty Acids (FISH OIL) 1000 MG CAPS Take 1,000 mg by mouth daily.   Yes [provider]  Travoprost, BAK Free, (TRAVATAN) 0.004 % SOLN ophthalmic solution Place 1 drop into both eyes at bedtime.    Yes [provider]     Family History  Problem Relation Age of Onset  . Kidney disease Mother   . Depression Mother   . Early death Mother   . Hypertension Mother   . Heart attack Mother   . Aortic aneurysm Mother   . Hypertension Father   . Dementia Father   . Depression Father   . Kidney disease Brother   . Hypertension Sister   . Heart disease Unknown        "all of moms side"  . Depression Unknown   . Heart failure Cousin     Social History   Socioeconomic History  . Marital status: Single    Spouse name: Not on file  . Number of children: 0  . Years of education: Some college  . Highest education level: Not on file  Occupational History  . 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 Financial resource strain: Not on file  . Food insecurity:    Worry: Not on file    Inability: Not on file  . Transportation needs:    Medical: Not on file    Non-medical: Not on file  Tobacco Use  . Smoking status: Never Smoker  . Smokeless tobacco: Never Used  Substance and Sexual Activity  . Alcohol use: No  . Drug use: No    Comment: in 20's but not now  . Sexual activity: Not Currently    Comment: menarche age 89, P 0, no HRT  Lifestyle  . Physical activity:    Days per week: Not on file    Minutes per session: Not on file  . Stress: Not on file  Relationships  . Social connections:    Talks on phone: Not on file     Gets together: Not on file    Attends religious service: Not on file    Active member of club or organization: Not on file    Attends meetings of clubs or organizations: Not on file    Relationship status: Not on file  Other Topics Concern  . Not on file  Social History Narrative   Single.  College.  Lives alone.  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   Right-handed   Dairy products, takes a daily vitamin, drinks caffeine, uses herbal remedies.   Alarm at home.  Wears her seatbelt.  Review of Systems: A 12 point ROS discussed and pertinent positives are indicated in the HPI above.  All other systems are negative.  Review of Systems  Constitutional: Positive for fatigue. Negative for appetite change and fever.  Respiratory: Positive for shortness of breath.   Cardiovascular: Negative for chest pain.  Gastrointestinal: Positive for abdominal pain and nausea.  Musculoskeletal: Positive for gait problem.  Neurological: Positive for weakness.  Psychiatric/Behavioral: Negative for behavioral problems and confusion.    Vital Signs: BP 119/71   Pulse 70   Temp 97.6 F (36.4 C) (Oral)   Ht 5' 5"  (1.651 m)   Wt 250 lb (113.4 kg)   LMP 02/15/2013   SpO2 98%   BMI 41.60 kg/m   Physical Exam  Cardiovascular: Normal rate, regular rhythm and normal heart sounds.  Pulmonary/Chest: Effort normal and breath sounds normal.  Abdominal: Soft. Bowel sounds are normal.  Musculoskeletal: Normal range of motion. She exhibits edema.  Left arm lymphedema  Skin: Skin is dry.  Psychiatric: She has a normal mood and affect. Her behavior is normal. Judgment and thought content normal.  Nursing note and vitals reviewed.   Imaging: Ct Chest W Contrast  Result Date: 02/09/2018 CLINICAL DATA:  Breast cancer diagnosed in 2015 with left lumpectomy. Arm swelling. Radiation therapy and chemotherapy complete. Shortness of breath. Abdominal bloating. Nausea and diarrhea.  EXAM: CT CHEST, ABDOMEN, AND PELVIS WITH CONTRAST TECHNIQUE: Multidetector CT imaging of the chest, abdomen and pelvis was performed following the standard protocol during bolus administration of intravenous contrast. CONTRAST:  118m OMNIPAQUE IOHEXOL 300 MG/ML  SOLN COMPARISON:  01/13/2018 renal ultrasound. 01/10/2016 chest abdomen and pelvic CTs. Chest radiograph 09/10/2016. FINDINGS: CT CHEST FINDINGS Cardiovascular: Aortic atherosclerosis. Mild cardiomegaly, without pericardial effusion. No central pulmonary embolism, on this non-dedicated study. Mediastinum/Nodes: No supraclavicular adenopathy. No axillary adenopathy. No hilar adenopathy. Low periesophageal nodes are enlarged but not pathologic by size criteria. Example at 9 mm on image 45/2 versus 2-3 mm on the prior. No internal mammary adenopathy. Lungs/Pleura: No pleural fluid. 2 mm right upper lobe pulmonary nodules x2, including on image 53/7. Not readily apparent on the prior. A 3 mm posterior right apical pulmonary nodule on image 39/7 and a 2 mm right apical pulmonary nodule on image 35/7 are similar to on the prior. Minimal radiation fibrosis in the anterior subpleural left upper lobe. Musculoskeletal: Left lumpectomy. Mild left breast thickening is likely radiation induced. Multifocal lytic lesions are new, consistent with osseous metastasis. Example the right-side of the T9 vertebral body at 1.4 cm on image 37/2. CT ABDOMEN PELVIS FINDINGS Hepatobiliary: Development of innumerable hypoattenuating liver lesions, consistent with metastatic disease. Index posterior right hepatic lobe lesion measures 2.2 cm on image 80/2. A dominant lateral segment left liver lobe mass measures 4.3 x 3.9 cm on image 61/2. Hepatomegaly at 23.4 cm craniocaudal. Mild degradation secondary to patient size and arm position, not raised above the head. Normal gallbladder, without biliary ductal dilatation. Pancreas: Normal, without mass or ductal dilatation. Spleen: Normal in  size, without focal abnormality. Adrenals/Urinary Tract: Normal adrenal glands. Normal right kidney. Left lower pole collecting system punctate calculus. Normal urinary bladder. Stomach/Bowel: Normal stomach, without wall thickening. Normal colon and terminal ileum. Normal small bowel. Vascular/Lymphatic: Normal caliber of the aorta and branch vessels. No abdominopelvic adenopathy. Reproductive: Normal uterus and adnexa. Other: Small volume free pelvic fluid and perihepatic ascites are nonspecific. No evidence of omental or peritoneal disease. Musculoskeletal: Pelvic sclerotic lesions are likely bone islands. Lumbosacral spondylosis. Heterogeneous  marrow density is less impressive than in the chest. IMPRESSION: 1. Extensive relatively diffuse hepatic metastasis. Secondary hepatomegaly. 2. Osseous metastasis, most apparent in the thoracic spine. 3. Decreased sensitivity and specificity exam due to technique related factors, as described above. 4. Nonspecific pulmonary nodules. Some were present on the prior and can be presumed benign. Others are not readily apparent on the prior but may have been obscured by thicker slice collimation on that study. Recommend attention on follow-up. 5. Enlargement of lower periesophageal nodes which are suspicious for metastatic disease. 6. Left nephrolithiasis. Electronically Signed   By: Abigail Miyamoto M.D.   On: 02/09/2018 13:25   Ct Abdomen Pelvis W Contrast  Result Date: 02/09/2018 CLINICAL DATA:  Breast cancer diagnosed in 2015 with left lumpectomy. Arm swelling. Radiation therapy and chemotherapy complete. Shortness of breath. Abdominal bloating. Nausea and diarrhea. EXAM: CT CHEST, ABDOMEN, AND PELVIS WITH CONTRAST TECHNIQUE: Multidetector CT imaging of the chest, abdomen and pelvis was performed following the standard protocol during bolus administration of intravenous contrast. CONTRAST:  141m OMNIPAQUE IOHEXOL 300 MG/ML  SOLN COMPARISON:  01/13/2018 renal ultrasound.  01/10/2016 chest abdomen and pelvic CTs. Chest radiograph 09/10/2016. FINDINGS: CT CHEST FINDINGS Cardiovascular: Aortic atherosclerosis. Mild cardiomegaly, without pericardial effusion. No central pulmonary embolism, on this non-dedicated study. Mediastinum/Nodes: No supraclavicular adenopathy. No axillary adenopathy. No hilar adenopathy. Low periesophageal nodes are enlarged but not pathologic by size criteria. Example at 9 mm on image 45/2 versus 2-3 mm on the prior. No internal mammary adenopathy. Lungs/Pleura: No pleural fluid. 2 mm right upper lobe pulmonary nodules x2, including on image 53/7. Not readily apparent on the prior. A 3 mm posterior right apical pulmonary nodule on image 39/7 and a 2 mm right apical pulmonary nodule on image 35/7 are similar to on the prior. Minimal radiation fibrosis in the anterior subpleural left upper lobe. Musculoskeletal: Left lumpectomy. Mild left breast thickening is likely radiation induced. Multifocal lytic lesions are new, consistent with osseous metastasis. Example the right-side of the T9 vertebral body at 1.4 cm on image 37/2. CT ABDOMEN PELVIS FINDINGS Hepatobiliary: Development of innumerable hypoattenuating liver lesions, consistent with metastatic disease. Index posterior right hepatic lobe lesion measures 2.2 cm on image 80/2. A dominant lateral segment left liver lobe mass measures 4.3 x 3.9 cm on image 61/2. Hepatomegaly at 23.4 cm craniocaudal. Mild degradation secondary to patient size and arm position, not raised above the head. Normal gallbladder, without biliary ductal dilatation. Pancreas: Normal, without mass or ductal dilatation. Spleen: Normal in size, without focal abnormality. Adrenals/Urinary Tract: Normal adrenal glands. Normal right kidney. Left lower pole collecting system punctate calculus. Normal urinary bladder. Stomach/Bowel: Normal stomach, without wall thickening. Normal colon and terminal ileum. Normal small bowel. Vascular/Lymphatic:  Normal caliber of the aorta and branch vessels. No abdominopelvic adenopathy. Reproductive: Normal uterus and adnexa. Other: Small volume free pelvic fluid and perihepatic ascites are nonspecific. No evidence of omental or peritoneal disease. Musculoskeletal: Pelvic sclerotic lesions are likely bone islands. Lumbosacral spondylosis. Heterogeneous marrow density is less impressive than in the chest. IMPRESSION: 1. Extensive relatively diffuse hepatic metastasis. Secondary hepatomegaly. 2. Osseous metastasis, most apparent in the thoracic spine. 3. Decreased sensitivity and specificity exam due to technique related factors, as described above. 4. Nonspecific pulmonary nodules. Some were present on the prior and can be presumed benign. Others are not readily apparent on the prior but may have been obscured by thicker slice collimation on that study. Recommend attention on follow-up. 5. Enlargement of lower periesophageal nodes which  are suspicious for metastatic disease. 6. Left nephrolithiasis. Electronically Signed   By: Abigail Miyamoto M.D.   On: 02/09/2018 13:25    Labs:  CBC: Recent Labs    01/19/18 1107 01/21/18 2127 02/01/18 1120 02/24/18 1145  WBC 16.2* 16.7* 10.1 9.8  HGB 11.6* 11.8* 11.7* 12.6  HCT 36.4 34.6* 35.6* 37.6  PLT 441.0* 453* 424.0* 453*    COAGS: No results for input(s): INR, APTT in the last 8760 hours.  BMP: Recent Labs    01/12/18 2347 01/13/18 0428 01/14/18 0413 01/19/18 1107 01/21/18 2127  NA 135 136 134* 135 134*  K 4.0 4.0 3.4* 4.2 4.1  CL 99* 100* 103 101 101  CO2 22 19* 18* 25 19*  GLUCOSE 106* 103* 102* 108* 93  BUN 44* 43* 44* 13 14  CALCIUM 10.1 9.7 9.8 10.0 9.4  CREATININE 2.58* 2.42* 1.69* 0.73 0.93  GFRNONAA 19* 20* 31*  --  >60  GFRAA 22* 23* 36*  --  >60    LIVER FUNCTION TESTS: Recent Labs    08/10/17 01/11/18 1202 01/12/18 2347  BILITOT  --  1.4* 1.0  AST 50* 90* 86*  ALT 57* 41 38  ALKPHOS  --  315* 322*  PROT  --  7.7 7.6    ALBUMIN  --  2.5* 2.7*    TUMOR MARKERS: No results for input(s): AFPTM, CEA, CA199, CHROMGRNA in the last 8760 hours.  Assessment and Plan:  Hx breast cancer abd pain; sob Imaging reveals liver lesions Now scheduled for biopsy of same Risks and benefits discussed with the patient including, but not limited to bleeding, infection, damage to adjacent structures or low yield requiring additional tests.  All of the patient's questions were answered, patient is agreeable to proceed. Consent signed and in chart.   Thank you for this interesting consult.  I greatly enjoyed meeting Kimberly Reed and look forward to participating in their care.  A copy of this report was sent to the requesting provider on this date.  Electronically Signed: Lavonia Drafts, PA-C 02/24/2018, 12:29 PM   I spent a total of  30 Minutes   in face to face in clinical consultation, greater than 50% of which was counseling/coordinating care for liver lesion biopsy

## 2018-02-24 NOTE — Progress Notes (Addendum)
Pt experiencing nausea, dry heave x 1, pt declines zofran at this time, she is now sipping ginger ale and nibbling on saltines. Continue to monitor. 1720 pt eating, nausea better.

## 2018-02-24 NOTE — Discharge Instructions (Signed)
Moderate Conscious Sedation, Adult, Care After °These instructions provide you with information about caring for yourself after your procedure. Your health care provider may also give you more specific instructions. Your treatment has been planned according to current medical practices, but problems sometimes occur. Call your health care provider if you have any problems or questions after your procedure. °What can I expect after the procedure? °After your procedure, it is common: °· To feel sleepy for several hours. °· To feel clumsy and have poor balance for several hours. °· To have poor judgment for several hours. °· To vomit if you eat too soon. ° °Follow these instructions at home: °For at least 24 hours after the procedure: ° °· Do not: °? Participate in activities where you could fall or become injured. °? Drive. °? Use heavy machinery. °? Drink alcohol. °? Take sleeping pills or medicines that cause drowsiness. °? Make important decisions or sign legal documents. °? Take care of children on your own. °· Rest. °Eating and drinking °· Follow the diet recommended by your health care provider. °· If you vomit: °? Drink water, juice, or soup when you can drink without vomiting. °? Make sure you have little or no nausea before eating solid foods. °General instructions °· Have a responsible adult stay with you until you are awake and alert. °· Take over-the-counter and prescription medicines only as told by your health care provider. °· If you smoke, do not smoke without supervision. °· Keep all follow-up visits as told by your health care provider. This is important. °Contact a health care provider if: °· You keep feeling nauseous or you keep vomiting. °· You feel light-headed. °· You develop a rash. °· You have a fever. °Get help right away if: °· You have trouble breathing. °This information is not intended to replace advice given to you by your health care provider. Make sure you discuss any questions you have  with your health care provider. °Document Released: 08/02/2013 Document Revised: 03/16/2016 Document Reviewed: 02/01/2016 °Elsevier Interactive Patient Education © 2018 Elsevier Inc. ° ° °Liver Biopsy, Care After °These instructions give you information on caring for yourself after your procedure. Your doctor may also give you more specific instructions. Call your doctor if you have any problems or questions after your procedure. °Follow these instructions at home: °· Rest at home for 1-2 days or as told by your doctor. °· Have someone stay with you for at least 24 hours. °· Do not do these things in the first 24 hours: °? Drive. °? Use machinery. °? Take care of other people. °? Sign legal documents. °? Take a bath or shower. °· There are many different ways to close and cover a cut (incision). For example, a cut can be closed with stitches, skin glue, or adhesive strips. Follow your doctor's instructions on: °? Taking care of your cut. °? Changing and removing your bandage (dressing). °? Removing whatever was used to close your cut. °· Do not drink alcohol in the first week. °· Do not lift more than 5 pounds or play contact sports for the first 2 weeks. °· Take medicines only as told by your doctor. For 1 week, do not take medicine that has aspirin in it or medicines like ibuprofen. °· Get your test results. °Contact a doctor if: °· A cut bleeds and leaves more than just a small spot of blood. °· A cut is red, puffs up (swells), or hurts more than before. °· Fluid or something else   comes from a cut. °· A cut smells bad. °· You have a fever or chills. °Get help right away if: °· You have swelling, bloating, or pain in your belly (abdomen). °· You get dizzy or faint. °· You have a rash. °· You feel sick to your stomach (nauseous) or throw up (vomit). °· You have trouble breathing, feel short of breath, or feel faint. °· Your chest hurts. °· You have problems talking or seeing. °· You have trouble balancing or moving  your arms or legs. °This information is not intended to replace advice given to you by your health care provider. Make sure you discuss any questions you have with your health care provider. °Document Released: 07/21/2008 Document Revised: 03/19/2016 Document Reviewed: 12/08/2013 °Elsevier Interactive Patient Education © 2018 Elsevier Inc. ° ° °

## 2018-02-24 NOTE — Procedures (Signed)
Interventional Radiology Procedure Note  Procedure: US guided liver mass biopsy.  Mx 18g core performed Complications: None Recommendations:  - 2 hour recovery -  Advance diet - Ok to shower tomorrow - Do not submerge for 7 days - Routine wound care   Signed,  Dulcy Fanny. Earleen Newport, DO

## 2018-02-24 NOTE — Sedation Documentation (Signed)
Patient is resting comfortably. 

## 2018-02-27 ENCOUNTER — Encounter: Payer: Self-pay | Admitting: Physical Therapy

## 2018-02-27 NOTE — Therapy (Signed)
[facility] [address] [place], [place], [address] Phone: [phone]   Fax:  [phone]  Patient Details  Name: Kimberly Reed MRN: [id number] Date of Birth: Aug 11, 1956 Referring Provider:  No ref. provider found  Encounter Date: 02/27/2018  PHYSICAL THERAPY DISCHARGE SUMMARY  Visits from Start of Care: 1  Current functional level related to goals / functional outcomes: Unable to assess - pt did not return for further treatment after learning about her diagnosis of metastatic disease.     Remaining deficits: Dizziness, impaired balance   Education / Equipment: N/A  Plan: Patient agrees to discharge.  Patient goals were not met. Patient is being discharged due to a change in medical status.  ?????     Rico Junker, PT, DPT 02/27/18    8:39 PM    [facility] [address] [place], [place], [address] Phone: [phone]   Fax:  [phone]

## 2018-02-28 ENCOUNTER — Ambulatory Visit: Payer: Self-pay | Admitting: *Deleted

## 2018-02-28 ENCOUNTER — Encounter: Payer: Self-pay | Admitting: *Deleted

## 2018-02-28 ENCOUNTER — Encounter: Payer: BLUE CROSS/BLUE SHIELD | Admitting: Physical Therapy

## 2018-02-28 ENCOUNTER — Inpatient Hospital Stay: Payer: BLUE CROSS/BLUE SHIELD | Attending: Hematology and Oncology | Admitting: Hematology and Oncology

## 2018-02-28 ENCOUNTER — Encounter: Payer: Self-pay | Admitting: General Practice

## 2018-02-28 DIAGNOSIS — Z923 Personal history of irradiation: Secondary | ICD-10-CM | POA: Diagnosis not present

## 2018-02-28 DIAGNOSIS — Z170 Estrogen receptor positive status [ER+]: Secondary | ICD-10-CM | POA: Insufficient documentation

## 2018-02-28 DIAGNOSIS — C50412 Malignant neoplasm of upper-outer quadrant of left female breast: Secondary | ICD-10-CM | POA: Diagnosis not present

## 2018-02-28 DIAGNOSIS — C7951 Secondary malignant neoplasm of bone: Secondary | ICD-10-CM | POA: Diagnosis not present

## 2018-02-28 DIAGNOSIS — F329 Major depressive disorder, single episode, unspecified: Secondary | ICD-10-CM | POA: Diagnosis not present

## 2018-02-28 DIAGNOSIS — C787 Secondary malignant neoplasm of liver and intrahepatic bile duct: Secondary | ICD-10-CM | POA: Insufficient documentation

## 2018-02-28 MED ORDER — ONDANSETRON 8 MG PO TBDP: 8.0000 mg | ORAL_TABLET | Freq: Three times a day (TID) | ORAL | 3 refills | Status: DC | PRN

## 2018-02-28 NOTE — Assessment & Plan Note (Addendum)
Metastatic breast cancer in the left breast involving left axillary lymph nodes and isolated L1 vertebral metastases stage IV disease, ER 100 percent, PR 4%, HER-2 negative ratio 1.3 Ki-67 15%  Chemotherapy summary: Completed dose dense Adriamycin Cytoxan 4 started 07/26/2014. Completed 12 weeks of Taxol by 12/18/2014. Left lumpectomy 01/30/2015: Invasive ductal carcinoma grade 2, 1.2 cm, 12/15 lymph nodes positive, extracapsular tumor extension present, T1c N3M1 stage IV Status post radiation completed 04/24/2015  Current treatment: Anastrozole since July 2016 discontinued December 2016, switched to tamoxifen 06/13/2017and was unable to tolerate it. She stopped it shortly thereafter, and has been on observation since then.    CT CAP 02/09/2018: Extensive diffuse hepatic metastases, secondary hepatomegaly, bone metastases in the thoracic spine, nonspecific pulmonary nodules, lower periesophageal lymph nodes  Liver biopsy 02/24/2018: Metastatic breast cancer, prognostic panel pending  Recommendation: If it is estrogen progesterone receptor positive, then I recommend treatment with Faslodex and Ibrance  Ibrance: I discussed the risks and benefits of Ibrance including myelosuppression especially neutropenia and with that risk of infection, there is risk of pulmonary embolism and mild peripheral neuropathy as well. Fatigue, nausea, diarrhea, decreased appetite as well as alopecia and thrombocytopenia are also potential side effects of Ibrance  Prognosis: Patient has poor prognosis given the extensive nature of her disease.  We discussed that the goal of treatment is palliation.  We cannot cure metastatic breast cancer.  However survival can be fairly long and estrogen receptor positive metastatic breast cancer especially if the response to treatment.  Return to clinic after starting Ibrance in 2 weeks for blood count check and follow-up

## 2018-02-28 NOTE — Progress Notes (Signed)
[facility] Spiritual Care Note  Provided pastoral presence and emotionally clinical voice while accompanying Kimberly Reed and [name] to her appt with Dr Lindi Adie today. Spent time with Kimberly Reed and Kimberly Reed prior to appt and followed up by phone this afternoon, for a total time today of ca 2h.   Kimberly Reed verbalized this pm that she knows that she is "not fully processing" the metastatic dx and limited px. We talked about brains' coping tools such as shock, numbness, and surreality as part of the process of coming to terms with major life changes. At this time, it is hard for Kimberly Reed to consider her wishes and goals outside of her financial stressors, which weigh heavily on her. She is very aware of being alone (no spouse, children, close family or close friends other than Kimberly Reed, who is a retired Merchandiser, retail and supportive church member) and of how high her baseline stress is (due to extreme fatigue and being in "survival mode" with work stress). Worries about disposition of job and insurance compound this baseline stress, now that she is aware of implications of metastatic disease. This combination makes it hard to think clearly about other goals and wishes.  Following closely for emotional support. Referred her to [facility], who plans to phone Kimberly Reed tomorrow, for assistance with questions related to disability and insurance, explaining that Kimberly Reed may have questions to help her determine questions for her employer and next steps. Plan to f/u tomorrow afternoon to debrief per pt request.  As always, please page if immediate needs arise or circumstances change. Thank you.   [place], [place], [facility] Pager [phone] Voicemail [phone]

## 2018-02-28 NOTE — Telephone Encounter (Signed)
Patient inquiring the name of an anti-nausea medication she had been prescribed in 2017. Phernergan was the medication. She will add this to her list of intolerable medications as it made her sick.

## 2018-02-28 NOTE — Progress Notes (Signed)
Patient Care Team: Ma Hillock, DO as PCP - General (Family Medicine) Nicholas Lose, MD as Consulting Physician (Hematology and Oncology) [name], MD as Consulting Physician (General Surgery) Eppie Gibson, MD as Attending Physician (Radiation Oncology) Particia Nearing, MD as Referring Physician (Gastroenterology) Delrae Rend, MD as Consulting Physician (Endocrinology) Kathrynn Ducking, MD as Consulting Physician (Neurology)  DIAGNOSIS:  Encounter Diagnosis  Name Primary?  . Malignant neoplasm of upper-outer quadrant of left breast in female, estrogen receptor positive ([place])     SUMMARY OF ONCOLOGIC HISTORY:   Breast cancer of upper-outer quadrant of left female breast ([place])   05/18/2014 Mammogram    Suspicious left upper outer quadrant 5 cm segmental area of calcifications.       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      05/30/2014 Breast MRI    Left Breast: 12oclock: 21 x 23 x 30 mm, Numerous  level 1 and 2 LN; Liver lesions cycts by Liver MRI      06/07/2014 Initial Biopsy    Left axillary lymph node biopsy invasive ductal carcinoma ER 100%, PR 11% Ki-67 15% HER-2 negative ratio 1.41      06/13/2014 PET scan    left breast activity, hypermetabolic left axillary and left retropectoral lymph node, small lucent lesion in L1 vertebra which was biopsied and proven to be bone metastases      07/19/2014 Initial Biopsy    Biopsy of L1 vertebra metastatic carcinoma breast primary, ER 100%, PR 4%, HER-2 negative ratio 1.3      07/26/2014 - 12/18/2014 Neo-Adjuvant Chemotherapy    Even though she has L1 solitary bone metastases, patient is being treated definitively with neoadjuvant chemotherapy with dose dense Adriamycin and Cytoxan to be followed by weekly Taxol x12      12/25/2014 PET scan    Interval improvement in size and metabolic activity associated with left  axillary lymph nodes consistent with treatment response, no hypermetabolic distant metastases, L1 vertebral body has no activity      12/25/2014 Breast MRI    No residual enhancement left breast, decreased left axillary lymphadenopathy, indicating response to treatment but lymph nodes are still present      01/30/2015 Surgery    Left breast lumpectomy: Invasive ductal carcinoma grade 2, 1.2 cm, 12/15 lymph nodes positive, extracapsular tumor extension present, T1c N3M1 stage IV       03/12/2015 - 04/24/2015 Radiation Therapy     adjuvant radiation therapy with Dr. Isidore Moos      05/16/2015 - 05/2016 Anti-estrogen oral therapy    Anastrozole 1 mg daily 10 years is the plan; [name] was recommended but she was not reachable and hence it was not filled, switched to tamoxifen 04/07/2016.  Was unable to tolerate Tamoxifen and stopped at some point in 2017, she isn't sure when.        01/10/2016 Imaging    CT chest abdomen pelvis: No evidence of metastatic disease      02/09/2018 Relapse/Recurrence    Extensive diffuse liver metastases 2.2 cm, 4.3 cm.  Hepatomegaly, bone metastases most apparent in thoracic spine, nonspecific lung nodules lower periesophageal lymph nodes suspicious      02/25/2018 Pathology Results    Liver biopsy: Metastatic breast cancer       CHIEF COMPLIANT: Follow-up of recently performed liver biopsy for metastatic breast cancer  INTERVAL HISTORY: Kimberly Reed is a 62 year old with a prior history  of left breast cancer treated with neoadjuvant chemotherapy followed by lumpectomy and antiestrogen therapy.  She could not continue with antiestrogen therapy and was discontinued in August 2017.  She presented with multiple symptoms and CT scans revealed diffuse hepatic metastatic disease as well as bone metastases.  Biopsy of the liver nodules came back positive for metastatic breast cancer.  Breast prognostic panel is pending.  She is here to discuss treatment options. Patient is  in a wheelchair and accompanied by friend of hers.  She has no family and no support system.  She has been able to get up and take a bath and do ADLs but beyond that she is incapable of doing much activity.  She does get grocery shopping done but that is only the maximum thing that she can do outside. She has had many problems including severe emotional distress related to the stage IV cancer diagnosis.  She complains of a poor taste in the mouth decreased appetite weight loss, tearfulness, shortness of breath to minimal exertion as well as severe itching of the skin.  She pains in the right leg is swollen and painful.  She complains of indigestion and nausea. Our chaplain Lattie Haw also accompanied her during this visit  REVIEW OF SYSTEMS:   Constitutional: Fatigue and generalized weakness, weight loss Eyes: Denies blurriness of vision Ears, nose, mouth, throat, and face: Poor taste in the mouth and loss of appetite Respiratory: Shortness of breath to minimal exertion Cardiovascular: Denies palpitation, chest discomfort Gastrointestinal: Profound nausea probably due to anxiety Skin: Diffuse skin itching Lymphatics: Denies new lymphadenopathy or easy bruising Neurological: Numbness in the right leg Behavioral/Psych: Severe depression and tearfulness Extremities: No lower extremity edema All other systems were reviewed with the patient and are negative.  I have reviewed the past medical history, past surgical history, social history and family history with the patient and they are unchanged from previous note.  ALLERGIES:  is allergic to other; effexor [venlafaxine]; and mucinex [guaifenesin er].  MEDICATIONS:  Current Outpatient Medications  Medication Sig Dispense Refill  . acetaminophen (TYLENOL) 325 MG tablet Take 2 tablets (650 mg total) by mouth every 6 (six) hours as needed for mild pain or moderate pain (or Fever >/= 101). 30 tablet 0  . brimonidine-timolol (COMBIGAN) 0.2-0.5 % ophthalmic  solution Place 1 drop into both eyes every 12 (twelve) hours.     . dorzolamide (TRUSOPT) 2 % ophthalmic solution Place 1 drop into both eyes 2 (two) times daily.     [name] levothyroxine (SYNTHROID, LEVOTHROID) 150 MCG tablet TAKE 1 TABLET BY MOUTH EVERY DAY ON EMPTY STOMACH IN THE MORNING  0  . Omega-3 Fatty Acids (FISH OIL) 1000 MG CAPS Take 1,000 mg by mouth daily.    . ondansetron (ZOFRAN-ODT) 8 MG disintegrating tablet Take 1 tablet (8 mg total) by mouth every 8 (eight) hours as needed for nausea or vomiting. 30 tablet 3  . Travoprost, BAK Free, (TRAVATAN) 0.004 % SOLN ophthalmic solution Place 1 drop into both eyes at bedtime.      No current facility-administered medications for this visit.     PHYSICAL EXAMINATION: ECOG PERFORMANCE STATUS: 1 - Symptomatic but completely ambulatory  Vitals:   02/28/18 0913  BP: (!) 101/54  Pulse: 69  Resp: 19  Temp: 98.7 F (37.1 C)  SpO2: 97%   Filed Weights   02/28/18 0913  Weight: 253 lb (114.8 kg)    GENERAL:alert, no distress and comfortable SKIN: skin color, texture, turgor are normal, no rashes  or significant lesions EYES: normal, Conjunctiva are pink and non-injected, sclera clear OROPHARYNX:no exudate, no erythema and lips, buccal mucosa, and tongue normal  NECK: supple, thyroid normal size, non-tender, without nodularity LYMPH:  no palpable lymphadenopathy in the cervical, axillary or inguinal LUNGS: clear to auscultation and percussion with normal breathing effort HEART: regular rate & rhythm and no murmurs and no lower extremity edema ABDOMEN:abdomen soft, non-tender and normal bowel sounds MUSCULOSKELETAL:no cyanosis of digits and no clubbing  NEURO: alert & oriented x 3 with fluent speech, no focal motor/sensory deficits EXTREMITIES: No lower extremity edema  LABORATORY DATA:  I have reviewed the data as listed CMP Latest Ref Rng & Units 01/21/2018 01/19/2018 01/14/2018  Glucose 65 - 99 mg/dL 93 108(H) 102(H)  BUN 6 - 20  mg/dL 14 13 44(H)  Creatinine 0.44 - 1.00 mg/dL 0.93 0.73 1.69(H)  Sodium 135 - 145 mmol/L 134(L) 135 134(L)  Potassium 3.5 - 5.1 mmol/L 4.1 4.2 3.4(L)  Chloride 101 - 111 mmol/L 101 101 103  CO2 22 - 32 mmol/L 19(L) 25 18(L)  Calcium 8.9 - 10.3 mg/dL 9.4 10.0 9.8  Total Protein 6.5 - 8.1 g/dL - - -  Total Bilirubin 0.3 - 1.2 mg/dL - - -  Alkaline Phos 38 - 126 U/L - - -  AST 15 - 41 U/L - - -  ALT 14 - 54 U/L - - -    Lab Results  Component Value Date   WBC 9.8 02/24/2018   HGB 12.6 02/24/2018   HCT 37.6 02/24/2018   MCV 88.9 02/24/2018   PLT 453 (H) 02/24/2018   NEUTROABS 6.5 02/01/2018    ASSESSMENT & PLAN:  Breast cancer of upper-outer quadrant of left female breast ([place]) Metastatic breast cancer in the left breast involving left axillary lymph nodes and isolated L1 vertebral metastases stage IV disease, ER 100 percent, PR 4%, HER-2 negative ratio 1.3 Ki-67 15%  Chemotherapy summary: Completed dose dense Adriamycin Cytoxan 4 started 07/26/2014. Completed 12 weeks of Taxol by 12/18/2014. Left lumpectomy 01/30/2015: Invasive ductal carcinoma grade 2, 1.2 cm, 12/15 lymph nodes positive, extracapsular tumor extension present, T1c N3M1 stage IV Status post radiation completed 04/24/2015  Current treatment: Anastrozole since July 2016 discontinued December 2016, switched to tamoxifen 06/13/2017and was unable to tolerate it. She stopped it shortly thereafter, and has been on observation since then.    CT CAP 02/09/2018: Extensive diffuse hepatic metastases, secondary hepatomegaly, bone metastases in the thoracic spine, nonspecific pulmonary nodules, lower periesophageal lymph nodes  Liver biopsy 02/24/2018: Metastatic breast cancer, prognostic panel pending  Recommendation: If it is estrogen progesterone receptor positive, then I recommend treatment with Faslodex and Ibrance. Patient was extremely worried about losing insurance when she loses her job. Because she has no support  system she is extremely worried that she will end up becoming homeless. Patient may be better compliant with oral letrozole rather than Faslodex.  We will have to discuss this before the treatment plan is final.  Ibrance: I discussed the risks and benefits of Ibrance including myelosuppression especially neutropenia and with that risk of infection, there is risk of pulmonary embolism and mild peripheral neuropathy as well. Fatigue, nausea, diarrhea, decreased appetite as well as alopecia and thrombocytopenia are also potential side effects of Ibrance  Prognosis: Patient has poor prognosis given the extensive nature of her disease.  We discussed that the goal of treatment is palliation.  We cannot cure metastatic breast cancer.  However survival can be fairly long and estrogen receptor  positive metastatic breast cancer especially if the response to treatment.  Patient can only accept a certain amount of information at her visit.  She informed me that she does not want to hear anymore at this time.  She would like to think about it and inform as of her decision.  She has made questions about how long that she have if she did not do any further treatment.  I believe that she probably has about 6 months to live if she does not receive any therapy. If she does receive the treatment and there is a possibility to survive for a few years. Ibrance and then do a scan. I discussed with her that our plan would be to do 3 cycles of Ibrance and then perform a scan   Severe depression: Our chaplain is helping her tremendously.  I suspect that we will have to started on an antidepressant to help with her symptoms.  However I did not have a chance to discuss this a whole lot because the patient told me that she does not want to discuss any other issues for today.  I will call her back in the next 2 to 3 days to see what her decision was regarding treatment. If she does not accept treatment then she would go on  hospice care.   No orders of the defined types were placed in this encounter.  The patient has a good understanding of the overall plan. she agrees with it. she will call with any problems that may develop before the next visit here.   Harriette Ohara, MD 02/28/18

## 2018-03-01 ENCOUNTER — Telehealth: Payer: Self-pay

## 2018-03-01 ENCOUNTER — Encounter: Payer: Self-pay | Admitting: *Deleted

## 2018-03-01 MED ORDER — ALPRAZOLAM 0.5 MG PO TABS: 0.5000 mg | ORAL_TABLET | Freq: Every evening | ORAL | 0 refills | Status: DC | PRN

## 2018-03-01 NOTE — Progress Notes (Signed)
[place] [employer]  Holiday representative received referral from [employer] for financial and disability concerns.  CSW contacted patient at home to offer support and assess for needs.  Patient stated she was currently on short term disability through her employer, but recently learned that due to her cancer progression she would not be able to return to work.  Patient expressed concern for maintaining insurance coverage and transitioning to long term disability through her employer versus social security disability.  CSW and patient explored all options.  Patient reported that when she was diagnosed in 2016 she received social security disability, but benefits were put on hold when she returned to work.  Patient reported using a company "[employer]" that was recommended by her insurance provider to help her apply for SSD.  Patient stated she contact "[employer]" yesterday to discuss SSD and was awaiting a return call.  CSW and patient discussed and identified questions to as her employer (HR) and [employer].  CSW encouraged patient to contact her HR to determine her eligibly for short term versus long term disability benefits, and to contact [employer] to determine if they would initiate her application to [place].  CSW also encouraged patient to request information on the waiting period for [facility] Medicare, and if she has already met the requirement based on the diagnosis date of her disability.  Patient verbalized understanding and stated she had written down questions and information.  Patient plans to contact her HR and [employer] and/or Social Security.  CSW encouraged patient to call with additional questions or concerns.         Johnnye Lana, MSW, LCSW, OSW-C Clinical Social Worker [facility] [phone]

## 2018-03-01 NOTE — Telephone Encounter (Signed)
Pt forgot to ask -"Will  Gudena will order xanax- [facility] did not get order".

## 2018-03-01 NOTE — Telephone Encounter (Signed)
Pt called and sounded distressed about her appt with Dr.Gudena yesterday. Pt states that she wanted clarification of what was discussed. Provided therapeutic conversation with pt and reinforced some information that was discussed, per Dr.Gudena's note.    She is having a hard time processing information at this time d/t the stress she is under with her current diagnosis and work situation. Pt requesting for a xanax prescription sent over to her pharmacy. She currently does not have anything for anxiety/depression. Per Dr.Gudena, okay to send out a script.   Pt working closely with Lattie Haw [employer]) and awaiting call from SW to discuss disability situation and work.   Confirmed that pt verbalized understanding of what Dr.Gudena's recommendations are, moving forward, once her breast prognostic panel comes back. It's currently pending results. Pt is aware of this and was very thankful for having RN explain plan of care and what the discussion for treatment was all about.   Ensured that pt concerns with this phone encounter was met and discussed. Pt has no further questions at this time.

## 2018-03-01 NOTE — Telephone Encounter (Signed)
Told pt that Dr.Gudena will need to sign order and will fax it to pharmacy today. Sent it out just now.

## 2018-03-02 ENCOUNTER — Telehealth: Payer: Self-pay | Admitting: *Deleted

## 2018-03-02 ENCOUNTER — Telehealth: Payer: Self-pay

## 2018-03-02 NOTE — Telephone Encounter (Signed)
Called to notify pt that her disability paperwork was received. Dr.Gudena had recommended extension of her disability at this time. Faxed requested documentation and paperwork to support need for disability to [employer]. [phone] P:[phone] Claim# [id number] Person# [id number]

## 2018-03-02 NOTE — Telephone Encounter (Signed)
Voicemail received reporting "[employer] forwarding information or forms for an extension of my disability".  Message forwarded to collaborative for notification and any further patient communication.

## 2018-03-03 ENCOUNTER — Encounter: Payer: Self-pay | Admitting: General Practice

## 2018-03-03 ENCOUNTER — Telehealth: Payer: Self-pay

## 2018-03-03 ENCOUNTER — Telehealth: Payer: Self-pay | Admitting: Neurology

## 2018-03-03 ENCOUNTER — Telehealth: Payer: Self-pay | Admitting: *Deleted

## 2018-03-03 ENCOUNTER — Telehealth: Payer: Self-pay | Admitting: Hematology and Oncology

## 2018-03-03 ENCOUNTER — Other Ambulatory Visit: Payer: Self-pay | Admitting: Hematology and Oncology

## 2018-03-03 MED ORDER — LETROZOLE 2.5 MG PO TABS: 2.5000 mg | ORAL_TABLET | Freq: Every day | ORAL | 3 refills | Status: DC

## 2018-03-03 MED ORDER — PALBOCICLIB 125 MG PO CAPS: 125.0000 mg | ORAL_CAPSULE | Freq: Every day | ORAL | 3 refills | Status: DC

## 2018-03-03 NOTE — Progress Notes (Signed)
[place] [facility]ual [facility] Note  Spoke with Jaeleen by phone this afternoon. Per pt, she "can hardly eat or get out of bed today"; she is concerned that appetite, nausea, and reflux are worse than they've been and requests appt with RD for assistance.  Referred to Pamala Hurry Neff/RD to phone pt tomorrow and Mateo Flow Dodd/RN, Dr Geralyn Flash nurse, to phone her this afternoon for immediate needs.  Continuing to follow for support. Please also page if immediate needs arise or circumstances change. Thank you!   [place], [place], [facility] Pager [phone] Voicemail [phone]

## 2018-03-03 NOTE — Telephone Encounter (Signed)
Received a email from [place] that as of now Ms Ciani has placed the CPAP setup on hold.  "FYI Sheena from [place] called [facility] and told them the pt. Had called [facility] to cancel her appt with [place] for set up. In doing so Sheena called [facility] and Lovena Le from [facility] called the pt. to reschedule. When she did so the pt. told us she wouldn't reschedule wanted to figure out her insurance coverage first. Then said she would call us."  Pt will call [facility] when ready to set up

## 2018-03-03 NOTE — Telephone Encounter (Signed)
Faxed medical records to [place] @ [phone]. Release ID# [id number]

## 2018-03-03 NOTE — Telephone Encounter (Signed)
This RN spoke with pt per her phone discussion with Kimberly Reed stating she is " not able to eat ".  Per this call - inquiry was made regarding fluid intake with pt stating " I am drinking all day long because my mouth stays dry "  She feels overall weak and states she is not comfortable driving.  Kimberly Reed stated concern due to " I have lost 30 lbs since all this started ".  This RN validated Kimberly Reed's concern and informed her per  Plan per this call is pt will continue with liberal fluid intake.  Request will be sent to Kimberly Reed for call to pt to discuss eating and need for caloric with nutritional value intake.  Kimberly Reed declined any further concerns at this time.  This note will be sent to MD, support staff and Kimberly Reed.

## 2018-03-03 NOTE — Telephone Encounter (Signed)
Returned pt's call regarding her pain level has increased and 'Dr Lindi Adie is supposed to call me', as I was speaking with her Dr Lindi Adie said to transfer pt to him so he can speak to her now- transferred pt's call to him.

## 2018-03-03 NOTE — Progress Notes (Unsigned)
Patient is agreeable for palbociclib with letrozole. I will send the prescription over. I will ask Denyse Amass our chemo pharmacist to help her. Patient is extremely concerned about losing her insurance and therefore losing her ability to pay for these medications.  We have gone over the side effects of palbociclib in great detail and patient appears to understand it.

## 2018-03-04 ENCOUNTER — Telehealth: Payer: Self-pay | Admitting: Pharmacist

## 2018-03-04 ENCOUNTER — Telehealth: Payer: Self-pay | Admitting: Pharmacy Technician

## 2018-03-04 ENCOUNTER — Ambulatory Visit: Payer: BLUE CROSS/BLUE SHIELD | Admitting: Nutrition

## 2018-03-04 DIAGNOSIS — C50412 Malignant neoplasm of upper-outer quadrant of left female breast: Secondary | ICD-10-CM

## 2018-03-04 DIAGNOSIS — Z170 Estrogen receptor positive status [ER+]: Principal | ICD-10-CM

## 2018-03-04 MED ORDER — LETROZOLE 2.5 MG PO TABS: 2.5000 mg | ORAL_TABLET | Freq: Every day | ORAL | 3 refills | Status: AC

## 2018-03-04 MED ORDER — PALBOCICLIB 125 MG PO CAPS: 125.0000 mg | ORAL_CAPSULE | Freq: Every day | ORAL | 3 refills | Status: DC

## 2018-03-04 NOTE — Telephone Encounter (Signed)
Oral Oncology Pharmacist Encounter  Received new prescription for Ibrance (palbociclib) for the treatment of metastatic, hormone receptor positive breast cancer in conjunction with Femara (letrozole), planned duration until disease progression or unacceptable toxicity.  Labs from [employer] d Noted some previous elevation of LFTs and bilirubin, will continue to be monitored Noted previous elevation of SCr (up to 2.53) 01/21/18 SCr=0.93, est CrCl >100, (age=61, wt=114.8kg)  Current medication list in Epic reviewed, no significant DDIs with Ibrance identified.  Prescription has been e-scribed to the [facility] for benefits analysis and approval.  Insurance authorization was required, was submitted, and is currently pending.  Patient noted to have [employer] commercial prescription insurance coverage. We anticipate insurance will mandate patient fills specialty medications through [facility]. We will have this information once insurance authorization is obtained.  Prescription will be sent to appropriate specialty pharmacy for dispensing once insurance authorization is obtained. We will update patient at that time about dispensing pharmacy.  Due to commercial prescription insurance coverage, patient is eligible for manufacturer copayment coupon. This will reduced out of pocket expense for Ibrance to as little at $0 per fill.  Noted patient may lose prescription insurance coverage in the future if she is unable to continue to work [facility] can work with patient at that time for continued medication acquisition if needed.  Oral Oncology Clinic will continue to follow for insurance authorization, copayment issues, initial counseling and start date.  Johny Drilling, PharmD, BCPS, BCOP 03/04/2018 11:34 AM Oral Oncology Clinic [phone]

## 2018-03-04 NOTE — Telephone Encounter (Signed)
Oral Chemotherapy Pharmacist Encounter   I spoke with patient for overview of: Ibrance for the treatment of metastatic, hormone receptor positive breast cancer in conjunction with Femara (letrozole), planned duration until disease progression or unacceptable toxicity.   Counseled patient on administration, dosing, side effects, monitoring, drug-food interactions, safe handling, storage, and disposal.  Patient will take Ibrance 125mg  capsules, 1 capsule by mouth once daily with breakfast for 3 weeks on, 1 week off.  Patient knows to avoid grapefruit and grapefruit juice.  Patient will start taking Feamar once daily with breakfast once obtained.  Ibrance start date: TBD, likely week of 03/07/18  Adverse effects include but are not limited to: fatigue, hair loss, GI upset, nausea, decreased blood counts, and increased upper respiratory infections. Patient will obtain anti diarrheal and alert the office of 4 or more loose stools above baseline.  Patient reminded of WBC check on Cycle 1 Day 14 for dose and ANC assessment.  Reviewed with patient importance of keeping a medication schedule and plan for any missed doses.  Ms. Hotard voiced understanding and appreciation.   All questions answered. Medication reconciliation performed and medication/allergy list updated.  Updated patient about [facility] dispensing [facility] for [place] per insurance requirement. Insurance authorization has been obtained and prescription has been e-scribed to the Therapist, nutritional in [place]. [place], [place].   Patient updated that dispensing pharmacy will call her to onboard her to their services. They will share copayment information with patient at that time. Patient instructed to ask pharmacy to enroll her for manufacturer copayment coupon if she can not afford her stated copayment.  Patient will call the office to alert [place] of Ibrance start date so that she can be scheduled for lab check of  C1D14 and follow-up prior to cycle 2 intitiation.  Patient knows to call the office with questions or concerns. Oral Oncology Clinic will continue to follow.  Thank you,  Johny Drilling, PharmD, BCPS, BCOP  03/04/2018   1:43 PM Oral Oncology Clinic [phone]

## 2018-03-04 NOTE — Telephone Encounter (Signed)
Oral Oncology Patient Advocate Encounter  Prior Authorization for [name] has been approved.    PA# [id number] Effective dates: 03/04/2018 through 03/05/2019  Oral Oncology Clinic will continue to follow.   Fabio Asa. Melynda Keller, [place] Patient [place] [phone] 03/04/2018 1:08 PM

## 2018-03-04 NOTE — Progress Notes (Signed)
Was asked to call patient on the telephone secondary to poor appetite and dry mouth.  Patient reports a 30 pound weight loss. She has also described dry mouth and weakness. I contacted patient by telephone. Patient was difficult to engage in conversation. Reports there are certain things she thinks she would like however she is not up to preparing it nor does she want to go to the store to pick it up. I educated patient that some stores provide pickup service.  She could order her food online and just drive-through and pick it up. "  Patient reports,  "I do not like those stores." Provided several suggestions on foods patient could consume that would be high-protein and provide calories however patient continued to respond "No, I'm not into that" or "No, I do not like that." She then ended the conversation by saying, "That's alright, I think I feel better today." I offered to mail fact sheets to patient which would provide additional suggestions. Patient agrees to me mailing education.  **Disclaimer: This note was dictated with voice recognition software. Similar sounding words can inadvertently be transcribed and this note may contain transcription errors which may not have been corrected upon publication of note.**

## 2018-03-04 NOTE — Telephone Encounter (Signed)
Oral Oncology Patient Advocate Encounter  Received notification from [facility] that prior authorization for [name] is required.  PA submitted on CoverMyMeds Key [id number] Status is pending  Oral Oncology Clinic will continue to follow.  Fabio Asa. Melynda Keller, [place] Patient [place] [phone] 03/04/2018 8:44 AM

## 2018-03-07 ENCOUNTER — Encounter: Payer: BLUE CROSS/BLUE SHIELD | Admitting: Physical Therapy

## 2018-03-08 NOTE — Telephone Encounter (Signed)
Oral Oncology Patient Advocate Encounter  Prior Authorization for [name] has been approved.    PA# [id number] Effective dates: 03/04/2018 through 03/05/2019  Oral Oncology Clinic will continue to follow.   Fabio Asa. Melynda Keller, [place] Patient [place] [phone] 03/08/2018 3:53 PM

## 2018-03-08 NOTE — Telephone Encounter (Signed)
Oral Oncology Pharmacist Encounter  [facility] to [facility]-up on status of patient's Ibrance prescription. Prescription is waiting on pharmacist verification. At that time they will be able to reach out to patient to onboard her to their services and schedule delivery. They have sent an email to expedite this process.  Ibrance copayment $0 for first month fill.  I called and updated patient on status of Ibrance prescription. I provided phone number to dispensing pharmacy [phone])  Patient instructed to call dispensing pharmacy in 2 hours to expedite scheduling of delivery. She will start her [name] as soon as she receives it.  Likely Ibrance start: 03/10/2018  Patient needs follow-up CBC for cycle 1 day 14 of Ibrance. This will be scheduled by ED and collaborative practice RN.  Patient instructed that office would be reaching out to her to schedule these appointments.  Patient knows to call the office with any additional questions or concerns.  Johny Drilling, PharmD, BCPS, BCOP  03/08/2018 10:16 AM Oral Oncology Clinic [phone]

## 2018-03-10 NOTE — Telephone Encounter (Signed)
Oral Oncology Patient Advocate Encounter  Received confirmation from [facility] that the patient's initial shipment of Ibrance arrived [facility] for pick up on 03/09/2018.   Fabio Asa. Melynda Keller, [place] Patient [place] [phone] 03/10/2018 11:23 AM

## 2018-03-23 ENCOUNTER — Other Ambulatory Visit: Payer: Self-pay

## 2018-03-23 DIAGNOSIS — C50412 Malignant neoplasm of upper-outer quadrant of left female breast: Secondary | ICD-10-CM

## 2018-03-24 ENCOUNTER — Telehealth: Payer: Self-pay | Admitting: Hematology and Oncology

## 2018-03-24 ENCOUNTER — Inpatient Hospital Stay: Payer: BLUE CROSS/BLUE SHIELD

## 2018-03-24 ENCOUNTER — Other Ambulatory Visit: Payer: Self-pay | Admitting: Hematology and Oncology

## 2018-03-24 ENCOUNTER — Inpatient Hospital Stay (HOSPITAL_BASED_OUTPATIENT_CLINIC_OR_DEPARTMENT_OTHER): Payer: BLUE CROSS/BLUE SHIELD | Admitting: Hematology and Oncology

## 2018-03-24 DIAGNOSIS — C50412 Malignant neoplasm of upper-outer quadrant of left female breast: Secondary | ICD-10-CM

## 2018-03-24 DIAGNOSIS — Z170 Estrogen receptor positive status [ER+]: Secondary | ICD-10-CM

## 2018-03-24 LAB — CMP (CANCER CENTER ONLY)
ALBUMIN: 3.4 g/dL — AB (ref 3.5–5.0)
ALT: 59 U/L — ABNORMAL HIGH (ref 0–55)
ANION GAP: 9 (ref 3–11)
AST: 89 U/L — ABNORMAL HIGH (ref 5–34)
Alkaline Phosphatase: 254 U/L — ABNORMAL HIGH (ref 40–150)
BILIRUBIN TOTAL: 0.8 mg/dL (ref 0.2–1.2)
BUN: 29 mg/dL — ABNORMAL HIGH (ref 7–26)
CO2: 23 mmol/L (ref 22–29)
Calcium: 10.6 mg/dL — ABNORMAL HIGH (ref 8.4–10.4)
Chloride: 107 mmol/L (ref 98–109)
Creatinine: 1.28 mg/dL — ABNORMAL HIGH (ref 0.60–1.10)
GFR, Est AFR Am: 50 mL/min — ABNORMAL LOW (ref >60)
GFR, Estimated: 44 mL/min — ABNORMAL LOW (ref >60)
GLUCOSE: 107 mg/dL (ref 70–140)
POTASSIUM: 4 mmol/L (ref 3.5–5.1)
SODIUM: 139 mmol/L (ref 136–145)
TOTAL PROTEIN: 7.6 g/dL (ref 6.4–8.3)

## 2018-03-24 LAB — CBC WITH DIFFERENTIAL (CANCER CENTER ONLY)
BASOS ABS: 0 10*3/uL (ref 0.0–0.1)
Basophils Relative: 1 %
Eosinophils Absolute: 0.1 10*3/uL (ref 0.0–0.5)
Eosinophils Relative: 5 %
HEMATOCRIT: 36.3 % (ref 34.8–46.6)
HEMOGLOBIN: 12.1 g/dL (ref 11.6–15.9)
Lymphocytes Relative: 55 %
Lymphs Abs: 1 10*3/uL (ref 0.9–3.3)
MCH: 30.6 pg (ref 25.1–34.0)
MCHC: 33.3 g/dL (ref 31.5–36.0)
MCV: 91.7 fL (ref 79.5–101.0)
MONO ABS: 0 10*3/uL — AB (ref 0.1–0.9)
Monocytes Relative: 2 %
NEUTROS ABS: 0.7 10*3/uL — AB (ref 1.5–6.5)
NEUTROS PCT: 37 %
Platelet Count: 211 10*3/uL (ref 145–400)
RBC: 3.96 MIL/uL (ref 3.70–5.45)
RDW: 20.5 % — ABNORMAL HIGH (ref 11.2–14.5)
WBC Count: 1.8 10*3/uL — ABNORMAL LOW (ref 3.9–10.3)

## 2018-03-24 MED ORDER — PALBOCICLIB 100 MG PO CAPS: 100.0000 mg | ORAL_CAPSULE | Freq: Every day | ORAL | 3 refills | Status: DC

## 2018-03-24 NOTE — Telephone Encounter (Signed)
Gave patient AVs and calendar of upcoming June appointments. °

## 2018-03-24 NOTE — Assessment & Plan Note (Addendum)
Metastatic breast cancer in the left breast involving left axillary lymph nodes and isolated L1 vertebral metastases stage IV disease, ER 100 percent, PR 4%, HER-2 negative ratio 1.3 Ki-67 15%  Chemotherapy summary: Completed dose dense Adriamycin Cytoxan 4 started 07/26/2014. Completed 12 weeks of Taxol by 12/18/2014. Left lumpectomy 01/30/2015: Invasive ductal carcinoma grade 2, 1.2 cm, 12/15 lymph nodes positive, extracapsular tumor extension present, T1c N3M1 stage IV Status post radiation completed 04/24/2015  CT CAP 02/09/2018: Extensive diffuse hepatic metastases, secondary hepatomegaly, bone metastases in the thoracic spine, nonspecific pulmonary nodules, lower periesophageal lymph nodes Liver biopsy 02/24/2018: Metastatic breast cancer, ER 100%, PR 10%, HER-2 negative ratio 1.41  Current treatment: Ibrance with letrozole started 03/11/2018 Ibrance toxicities:  Return to clinic in 2 weeks for labs and toxicity check

## 2018-03-24 NOTE — Progress Notes (Signed)
Patient Care Team: Ma Hillock, DO as PCP - General (Family Medicine) Nicholas Lose, MD as Consulting Physician (Hematology and Oncology) [name], MD as Consulting Physician (General Surgery) Eppie Gibson, MD as Attending Physician (Radiation Oncology) Particia Nearing, MD as Referring Physician (Gastroenterology) Delrae Rend, MD as Consulting Physician (Endocrinology) Kathrynn Ducking, MD as Consulting Physician (Neurology)  DIAGNOSIS:  Encounter Diagnosis  Name Primary?  . 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      05/30/2014 Breast MRI    Left Breast: 12oclock: 21 x 23 x 30 mm, Numerous  level 1 and 2 LN; Liver lesions cycts by Liver MRI      06/07/2014 Initial Biopsy    Left axillary lymph node biopsy invasive ductal carcinoma ER 100%, PR 11% Ki-67 15% HER-2 negative ratio 1.41      06/13/2014 PET scan    left breast activity, hypermetabolic left axillary and left retropectoral lymph node, small lucent lesion in L1 vertebra which was biopsied and proven to be bone metastases      07/19/2014 Initial Biopsy    Biopsy of L1 vertebra metastatic carcinoma breast primary, ER 100%, PR 4%, HER-2 negative ratio 1.3      07/26/2014 - 12/18/2014 Neo-Adjuvant Chemotherapy    Even though she has L1 solitary bone metastases, patient is being treated definitively with neoadjuvant chemotherapy with dose dense Adriamycin and Cytoxan to be followed by weekly Taxol x12      12/25/2014 PET scan    Interval improvement in size and metabolic activity associated with left  axillary lymph nodes consistent with treatment response, no hypermetabolic distant metastases, L1 vertebral body has no activity      12/25/2014 Breast MRI    No residual enhancement left breast, decreased left axillary lymphadenopathy, indicating response to treatment but lymph nodes are still present      01/30/2015 Surgery    Left breast lumpectomy: Invasive ductal carcinoma grade 2, 1.2 cm, 12/15 lymph nodes positive, extracapsular tumor extension present, T1c N3M1 stage IV       03/12/2015 - 04/24/2015 Radiation Therapy     adjuvant radiation therapy with Dr. Isidore Moos      05/16/2015 - 05/2016 Anti-estrogen oral therapy    Anastrozole 1 mg daily 10 years is the plan; [name] was recommended but she was not reachable and hence it was not filled, switched to tamoxifen 04/07/2016.  Was unable to tolerate Tamoxifen and stopped at some point in 2017, she isn't sure when.        01/10/2016 Imaging    CT chest abdomen pelvis: No evidence of metastatic disease      02/09/2018 Relapse/Recurrence    Extensive diffuse liver metastases 2.2 cm, 4.3 cm.  Hepatomegaly, bone metastases most apparent in thoracic spine, nonspecific lung nodules lower periesophageal lymph nodes suspicious      02/25/2018 Pathology Results    Liver biopsy: Metastatic breast cancer ER 100%, PR 10%, HER-2 negative ratio 1.41       03/10/2018 -  Anti-estrogen oral therapy    Ibrance with letrozole  CHIEF COMPLIANT: Follow-up on Ibrance with letrozole  INTERVAL HISTORY: Kimberly Reed is a 62 year old with above-mentioned history metastatic breast cancer with liver metastases and she is here for 2 weeks after starting treatment with Ibrance with letrozole.  She reports in the past 2 weeks she has felt more fatigued.  She could not sleep last night and therefore she is very drowsy today.  She also tells me that her depression has not been under control and that she will talk with her counselor tomorrow.  REVIEW OF  SYSTEMS:   Constitutional: Denies fevers, chills or abnormal weight loss Eyes: Denies blurriness of vision Ears, nose, mouth, throat, and face: Denies mucositis or sore throat Respiratory: Denies cough, dyspnea or wheezes Cardiovascular: Denies palpitation, chest discomfort Gastrointestinal:  Denies nausea, heartburn or change in bowel habits Skin: Denies abnormal skin rashes Lymphatics: Denies new lymphadenopathy or easy bruising Neurological:Denies numbness, tingling or new weaknesses Behavioral/Psych: Depression and sleepiness  Extremities: No lower extremity edema  All other systems were reviewed with the patient and are negative.  I have reviewed the past medical history, past surgical history, social history and family history with the patient and they are unchanged from previous note.  ALLERGIES:  is allergic to other; effexor [venlafaxine]; and mucinex [guaifenesin er].  MEDICATIONS:  Current Outpatient Medications  Medication Sig Dispense Refill  . acetaminophen (TYLENOL) 325 MG tablet Take 2 tablets (650 mg total) by mouth every 6 (six) hours as needed for mild pain or moderate pain (or Fever >/= 101). 30 tablet 0  . ALPRAZolam (XANAX) 0.5 MG tablet Take 1 tablet (0.5 mg total) by mouth at bedtime as needed for anxiety. 30 tablet 0  . brimonidine-timolol (COMBIGAN) 0.2-0.5 % ophthalmic solution Place 1 drop into both eyes every 12 (twelve) hours.     . dorzolamide (TRUSOPT) 2 % ophthalmic solution Place 1 drop into both eyes 2 (two) times daily.     [name] letrozole (FEMARA) 2.5 MG tablet Take 1 tablet (2.5 mg total) by mouth daily. 90 tablet 3  . levothyroxine (SYNTHROID, LEVOTHROID) 150 MCG tablet TAKE 1 TABLET BY MOUTH EVERY DAY ON EMPTY STOMACH IN THE MORNING  0  . Omega-3 Fatty Acids (FISH OIL) 1000 MG CAPS Take 1,000 mg by mouth daily.    . ondansetron (ZOFRAN-ODT) 8 MG disintegrating tablet Take 1 tablet (8 mg total) by mouth every 8 (eight) hours as needed for nausea or  vomiting. 30 tablet 3  . palbociclib (IBRANCE) 100 MG capsule Take 1 capsule (100 mg total) by mouth daily with breakfast. Take whole with food. Take for 21 days on, 7 days off, repeat every 28 days. 21 capsule 3  . Travoprost, BAK Free, (TRAVATAN) 0.004 % SOLN ophthalmic solution Place 1 drop into both eyes at bedtime.      No current facility-administered medications for this visit.     PHYSICAL EXAMINATION: ECOG PERFORMANCE STATUS: 2 - Symptomatic, <50% confined to bed  Vitals:   03/24/18 1224  BP: 122/78  Pulse: 64  Resp: 17  Temp: 98 F (36.7 C)  SpO2: 100%   Filed Weights   03/24/18 1224  Weight: 243 lb 6.4 oz (110.4 kg)    GENERAL:alert, no distress and comfortable SKIN: skin color, texture, turgor are normal, no rashes or significant lesions EYES: normal, Conjunctiva are pink and non-injected, sclera clear OROPHARYNX:no exudate, no erythema and lips, buccal mucosa, and tongue normal  NECK: supple, thyroid normal size, non-tender, without nodularity LYMPH:  no palpable lymphadenopathy in the cervical,  axillary or inguinal LUNGS: clear to auscultation and percussion with normal breathing effort HEART: regular rate & rhythm and no murmurs and no lower extremity edema ABDOMEN:abdomen soft, non-tender and normal bowel sounds MUSCULOSKELETAL:no cyanosis of digits and no clubbing  NEURO: alert & oriented x 3 with fluent speech, no focal motor/sensory deficits EXTREMITIES: No lower extremity edema  LABORATORY DATA:  I have reviewed the data as listed CMP Latest Ref Rng & Units 03/24/2018 01/21/2018 01/19/2018  Glucose 70 - 140 mg/dL 107 93 108(H)  BUN 7 - 26 mg/dL 29(H) 14 13  Creatinine 0.60 - 1.10 mg/dL 1.28(H) 0.93 0.73  Sodium 136 - 145 mmol/L 139 134(L) 135  Potassium 3.5 - 5.1 mmol/L 4.0 4.1 4.2  Chloride 98 - 109 mmol/L 107 101 101  CO2 22 - 29 mmol/L 23 19(L) 25  Calcium 8.4 - 10.4 mg/dL 10.6(H) 9.4 10.0  Total Protein 6.4 - 8.3 g/dL 7.6 - -  Total Bilirubin 0.2  - 1.2 mg/dL 0.8 - -  Alkaline Phos 40 - 150 U/L 254(H) - -  AST 5 - 34 U/L 89(H) - -  ALT 0 - 55 U/L 59(H) - -    Lab Results  Component Value Date   WBC 1.8 (L) 03/24/2018   HGB 12.1 03/24/2018   HCT 36.3 03/24/2018   MCV 91.7 03/24/2018   PLT 211 03/24/2018   NEUTROABS 0.7 (L) 03/24/2018    ASSESSMENT & PLAN:  Breast cancer of upper-outer quadrant of left female breast ([place]) Metastatic breast cancer in the left breast involving left axillary lymph nodes and isolated L1 vertebral metastases stage IV disease, ER 100 percent, PR 4%, HER-2 negative ratio 1.3 Ki-67 15%  Chemotherapy summary: Completed dose dense Adriamycin Cytoxan 4 started 07/26/2014. Completed 12 weeks of Taxol by 12/18/2014. Left lumpectomy 01/30/2015: Invasive ductal carcinoma grade 2, 1.2 cm, 12/15 lymph nodes positive, extracapsular tumor extension present, T1c N3M1 stage IV Status post radiation completed 04/24/2015  CT CAP 02/09/2018: Extensive diffuse hepatic metastases, secondary hepatomegaly, bone metastases in the thoracic spine, nonspecific pulmonary nodules, lower periesophageal lymph nodes Liver biopsy 02/24/2018: Metastatic breast cancer, ER 100%, PR 10%, HER-2 negative ratio 1.41  Current treatment: Ibrance with letrozole started 03/11/2018 Ibrance toxicities: Neutropenia ANC 0.7.  I recommended that she hold Ibrance further.  I sent a new prescription for Ibrance 100 mg.  She will not resume Ibrance until I see her back in 2 weeks and repeat her blood work.  Return to clinic in 2 weeks for labs and toxicity check     Orders Placed This Encounter  Procedures  . CBC with Differential (Cancer Center Only)    Standing Status:   Future    Standing Expiration Date:   03/25/2019   The patient has a good understanding of the overall plan. she agrees with it. she will call with any problems that may develop before the next visit here.   Harriette Ohara, MD 03/24/18

## 2018-03-25 ENCOUNTER — Other Ambulatory Visit: Payer: Self-pay | Admitting: Pharmacist

## 2018-03-29 ENCOUNTER — Telehealth: Payer: Self-pay

## 2018-03-29 NOTE — Telephone Encounter (Signed)
Called in patients xanax refill to pharmacy.  Cyndia Bent RN

## 2018-03-31 ENCOUNTER — Telehealth: Payer: Self-pay

## 2018-03-31 NOTE — Telephone Encounter (Signed)
Returned pt call and confirmed next appointments with labs prior to starting next cycle of Ibrance.  Cyndia Bent RN

## 2018-04-05 ENCOUNTER — Telehealth: Payer: Self-pay | Admitting: Hematology and Oncology

## 2018-04-05 ENCOUNTER — Inpatient Hospital Stay: Payer: BLUE CROSS/BLUE SHIELD | Attending: Hematology and Oncology

## 2018-04-05 ENCOUNTER — Inpatient Hospital Stay (HOSPITAL_BASED_OUTPATIENT_CLINIC_OR_DEPARTMENT_OTHER): Payer: BLUE CROSS/BLUE SHIELD | Admitting: Hematology and Oncology

## 2018-04-05 DIAGNOSIS — F329 Major depressive disorder, single episode, unspecified: Secondary | ICD-10-CM

## 2018-04-05 DIAGNOSIS — C787 Secondary malignant neoplasm of liver and intrahepatic bile duct: Secondary | ICD-10-CM | POA: Insufficient documentation

## 2018-04-05 DIAGNOSIS — D701 Agranulocytosis secondary to cancer chemotherapy: Secondary | ICD-10-CM

## 2018-04-05 DIAGNOSIS — Z170 Estrogen receptor positive status [ER+]: Secondary | ICD-10-CM | POA: Insufficient documentation

## 2018-04-05 DIAGNOSIS — C773 Secondary and unspecified malignant neoplasm of axilla and upper limb lymph nodes: Secondary | ICD-10-CM | POA: Diagnosis not present

## 2018-04-05 DIAGNOSIS — C50412 Malignant neoplasm of upper-outer quadrant of left female breast: Secondary | ICD-10-CM

## 2018-04-05 DIAGNOSIS — C7951 Secondary malignant neoplasm of bone: Secondary | ICD-10-CM

## 2018-04-05 LAB — CBC WITH DIFFERENTIAL (CANCER CENTER ONLY)
Basophils Absolute: 0 10*3/uL (ref 0.0–0.1)
Basophils Relative: 1 %
EOS ABS: 0.1 10*3/uL (ref 0.0–0.5)
Eosinophils Relative: 4 %
HCT: 34.9 % (ref 34.8–46.6)
HEMOGLOBIN: 11.6 g/dL (ref 11.6–15.9)
LYMPHS ABS: 1.2 10*3/uL (ref 0.9–3.3)
LYMPHS PCT: 39 %
MCH: 30.9 pg (ref 25.1–34.0)
MCHC: 33.3 g/dL (ref 31.5–36.0)
MCV: 92.8 fL (ref 79.5–101.0)
MONOS PCT: 15 %
Monocytes Absolute: 0.4 10*3/uL (ref 0.1–0.9)
NEUTROS PCT: 41 %
Neutro Abs: 1.2 10*3/uL — ABNORMAL LOW (ref 1.5–6.5)
Platelet Count: 274 10*3/uL (ref 145–400)
RBC: 3.76 MIL/uL (ref 3.70–5.45)
RDW: 24.4 % — ABNORMAL HIGH (ref 11.2–14.5)
WBC Count: 2.9 10*3/uL — ABNORMAL LOW (ref 3.9–10.3)

## 2018-04-05 NOTE — Assessment & Plan Note (Signed)
Metastatic breast cancer in the left breast involving left axillary lymph nodes and isolated L1 vertebral metastases stage IV disease, ER 100 percent, PR 4%, HER-2 negative ratio 1.3 Ki-67 15%  Chemotherapy summary: Completed dose dense Adriamycin Cytoxan 4 started 07/26/2014. Completed 12 weeks of Taxol by 12/18/2014. Left lumpectomy 01/30/2015: Invasive ductal carcinoma grade 2, 1.2 cm, 12/15 lymph nodes positive, extracapsular tumor extension present, T1c N3M1 stage IV Status post radiation completed 04/24/2015  CTCAP4/17/2019: Extensive diffuse hepatic metastases, secondary hepatomegaly, bone metastases in the thoracic spine, nonspecific pulmonary nodules, lower periesophageal lymph nodes Liver biopsy 02/24/2018: Metastatic breast cancer, ER 100%, PR 10%, HER-2 negative ratio 1.41  Current treatment: Ibrance with letrozole started 03/11/2018 Ibrance toxicities: 1.  Neutropenia: Today's ANC has improved.  So she can resume Ibrance at 100 mg daily Return to clinic in 4 weeks for blood work and follow-up

## 2018-04-05 NOTE — Progress Notes (Signed)
Patient Care Team: Ma Hillock, DO as PCP - General (Family Medicine) Nicholas Lose, MD as Consulting Physician (Hematology and Oncology) [name], MD as Consulting Physician (General Surgery) Eppie Gibson, MD as Attending Physician (Radiation Oncology) Particia Nearing, MD as Referring Physician (Gastroenterology) Delrae Rend, MD as Consulting Physician (Endocrinology) Kathrynn Ducking, MD as Consulting Physician (Neurology)  DIAGNOSIS:  Encounter Diagnosis  Name Primary?  . 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      05/30/2014 Breast MRI    Left Breast: 12oclock: 21 x 23 x 30 mm, Numerous  level 1 and 2 LN; Liver lesions cycts by Liver MRI      06/07/2014 Initial Biopsy    Left axillary lymph node biopsy invasive ductal carcinoma ER 100%, PR 11% Ki-67 15% HER-2 negative ratio 1.41      06/13/2014 PET scan    left breast activity, hypermetabolic left axillary and left retropectoral lymph node, small lucent lesion in L1 vertebra which was biopsied and proven to be bone metastases      07/19/2014 Initial Biopsy    Biopsy of L1 vertebra metastatic carcinoma breast primary, ER 100%, PR 4%, HER-2 negative ratio 1.3      07/26/2014 - 12/18/2014 Neo-Adjuvant Chemotherapy    Even though she has L1 solitary bone metastases, patient is being treated definitively with neoadjuvant chemotherapy with dose dense Adriamycin and Cytoxan to be followed by weekly Taxol x12      12/25/2014 PET scan    Interval improvement in size and metabolic activity associated with left  axillary lymph nodes consistent with treatment response, no hypermetabolic distant metastases, L1 vertebral body has no activity      12/25/2014 Breast MRI    No residual enhancement left breast, decreased left axillary lymphadenopathy, indicating response to treatment but lymph nodes are still present      01/30/2015 Surgery    Left breast lumpectomy: Invasive ductal carcinoma grade 2, 1.2 cm, 12/15 lymph nodes positive, extracapsular tumor extension present, T1c N3M1 stage IV       03/12/2015 - 04/24/2015 Radiation Therapy     adjuvant radiation therapy with Dr. Isidore Moos      05/16/2015 - 05/2016 Anti-estrogen oral therapy    Anastrozole 1 mg daily 10 years is the plan; Kimberly Reed was recommended but she was not reachable and hence it was not filled, switched to tamoxifen 04/07/2016.  Was unable to tolerate Tamoxifen and stopped at some point in 2017, she isn't sure when.        01/10/2016 Imaging    CT chest abdomen pelvis: No evidence of metastatic disease      02/09/2018 Relapse/Recurrence    Extensive diffuse liver metastases 2.2 cm, 4.3 cm.  Hepatomegaly, bone metastases most apparent in thoracic spine, nonspecific lung nodules lower periesophageal lymph nodes suspicious      02/25/2018 Pathology Results    Liver biopsy: Metastatic breast cancer ER 100%, PR 10%, HER-2 negative ratio 1.41       03/10/2018 -  Anti-estrogen oral therapy    Ibrance with letrozole  CHIEF COMPLIANT: Follow-up on Ibrance with letrozole  INTERVAL HISTORY: Kimberly Reed is a 62 year old with above-mentioned history metastatic breast cancer with liver metastases who is currently on Ibrance with letrozole.  We had to hold Ibrance because her ANC was 0.7.  She is here for recheck of her blood counts and to determine if she is ready to resume Ibrance.  She has not had any other side effects from [place].  Denies any nausea vomiting.  REVIEW OF SYSTEMS:   Constitutional: Denies fevers, chills or abnormal  weight loss Eyes: Denies blurriness of vision Ears, nose, mouth, throat, and face: Denies mucositis or sore throat Respiratory: Denies cough, dyspnea or wheezes Cardiovascular: Denies palpitation, chest discomfort Gastrointestinal:  Denies nausea, heartburn or change in bowel habits Skin: Denies abnormal skin rashes Lymphatics: Denies new lymphadenopathy or easy bruising Neurological:Denies numbness, tingling or new weaknesses Behavioral/Psych: Severe depression, does not want to socialize even with friends Extremities: No lower extremity edema  All other systems were reviewed with the patient and are negative.  I have reviewed the past medical history, past surgical history, social history and family history with the patient and they are unchanged from previous note.  ALLERGIES:  is allergic to other; effexor [venlafaxine]; and mucinex [guaifenesin er].  MEDICATIONS:  Current Outpatient Medications  Medication Sig Dispense Refill  . acetaminophen (TYLENOL) 325 MG tablet Take 2 tablets (650 mg total) by mouth every 6 (six) hours as needed for mild pain or moderate pain (or Fever >/= 101). 30 tablet 0  . ALPRAZolam (XANAX) 0.5 MG tablet TAKE 1 TABLET BY MOUTH AT BEDTIME AS NEEDED FOR ANXIETY 30 tablet 0  . brimonidine-timolol (COMBIGAN) 0.2-0.5 % ophthalmic solution Place 1 drop into both eyes every 12 (twelve) hours.     . dorzolamide (TRUSOPT) 2 % ophthalmic solution Place 1 drop into both eyes 2 (two) times daily.     [name] letrozole (FEMARA) 2.5 MG tablet Take 1 tablet (2.5 mg total) by mouth daily. 90 tablet 3  . levothyroxine (SYNTHROID, LEVOTHROID) 150 MCG tablet TAKE 1 TABLET BY MOUTH EVERY DAY ON EMPTY STOMACH IN THE MORNING  0  . Omega-3 Fatty Acids (FISH OIL) 1000 MG CAPS Take 1,000 mg by mouth daily.    . ondansetron (ZOFRAN-ODT) 8 MG disintegrating tablet Take 1 tablet (8 mg total) by mouth every 8 (eight) hours as needed for nausea or vomiting. 30 tablet 3  . palbociclib  (IBRANCE) 100 MG capsule Take 1 capsule (100 mg total) by mouth daily with breakfast. Take whole with food. Take for 21 days on, 7 days off, repeat every 28 days. 21 capsule 3  . Travoprost, BAK Free, (TRAVATAN) 0.004 % SOLN ophthalmic solution Place 1 drop into both eyes at bedtime.      No current facility-administered medications for this visit.     PHYSICAL EXAMINATION: ECOG PERFORMANCE STATUS: 1 - Symptomatic but completely ambulatory  Vitals:   04/05/18 1457  BP: (!) 154/81  Pulse: 63  Resp: 18  Temp: 97.8 F (36.6 C)  SpO2: 97%   Filed Weights   04/05/18 1457  Weight: 243 lb 1.6 oz (110.3 kg)    GENERAL:alert, no distress and comfortable SKIN: skin color, texture, turgor are normal, no rashes or significant lesions EYES: normal, Conjunctiva are pink and non-injected, sclera clear OROPHARYNX:no exudate, no erythema and lips, buccal mucosa, and tongue normal  NECK: supple, thyroid normal size, non-tender, without nodularity LYMPH:  no palpable lymphadenopathy in the cervical, axillary or inguinal LUNGS: clear to  auscultation and percussion with normal breathing effort HEART: regular rate & rhythm and no murmurs and no lower extremity edema ABDOMEN:abdomen soft, non-tender and normal bowel sounds MUSCULOSKELETAL:no cyanosis of digits and no clubbing  NEURO: alert & oriented x 3 with fluent speech, no focal motor/sensory deficits EXTREMITIES: No lower extremity edema, wheelchair  LABORATORY DATA:  I have reviewed the data as listed CMP Latest Ref Rng & Units 03/24/2018 01/21/2018 01/19/2018  Glucose 70 - 140 mg/dL 107 93 108(H)  BUN 7 - 26 mg/dL 29(H) 14 13  Creatinine 0.60 - 1.10 mg/dL 1.28(H) 0.93 0.73  Sodium 136 - 145 mmol/L 139 134(L) 135  Potassium 3.5 - 5.1 mmol/L 4.0 4.1 4.2  Chloride 98 - 109 mmol/L 107 101 101  CO2 22 - 29 mmol/L 23 19(L) 25  Calcium 8.4 - 10.4 mg/dL 10.6(H) 9.4 10.0  Total Protein 6.4 - 8.3 g/dL 7.6 - -  Total Bilirubin 0.2 - 1.2 mg/dL 0.8 -  -  Alkaline Phos 40 - 150 U/L 254(H) - -  AST 5 - 34 U/L 89(H) - -  ALT 0 - 55 U/L 59(H) - -    Lab Results  Component Value Date   WBC 2.9 (L) 04/05/2018   HGB 11.6 04/05/2018   HCT 34.9 04/05/2018   MCV 92.8 04/05/2018   PLT 274 04/05/2018   NEUTROABS 1.2 (L) 04/05/2018    ASSESSMENT & PLAN:  Breast cancer of upper-outer quadrant of left female breast ([place]) Metastatic breast cancer in the left breast involving left axillary lymph nodes and isolated L1 vertebral metastases stage IV disease, ER 100 percent, PR 4%, HER-2 negative ratio 1.3 Ki-67 15%  Chemotherapy summary: Completed dose dense Adriamycin Cytoxan 4 started 07/26/2014. Completed 12 weeks of Taxol by 12/18/2014. Left lumpectomy 01/30/2015: Invasive ductal carcinoma grade 2, 1.2 cm, 12/15 lymph nodes positive, extracapsular tumor extension present, T1c N3M1 stage IV Status post radiation completed 04/24/2015  CTCAP4/17/2019: Extensive diffuse hepatic metastases, secondary hepatomegaly, bone metastases in the thoracic spine, nonspecific pulmonary nodules, lower periesophageal lymph nodes Liver biopsy 02/24/2018: Metastatic breast cancer, ER 100%, PR 10%, HER-2 negative ratio 1.41  Current treatment: Ibrance with letrozole started 03/11/2018 Ibrance toxicities: 1.  Neutropenia: Today's ANC has improved.  So she can resume Ibrance at 100 mg daily  Major depression: Patient sees therapist.  Lorrin Jackson has been of great help.  I discussed with her about participating in social events with her friends to come out of the house more often.  Return to clinic in 4 weeks for blood work and follow-up     Orders Placed This Encounter  Procedures  . CBC with Differential (Cancer Center Only)    Standing Status:   Future    Standing Expiration Date:   04/06/2019  . CMP ([facility] only)    Standing Status:   Future    Standing Expiration Date:   04/06/2019   The patient has a good understanding of the overall plan.  she agrees with it. she will call with any problems that may develop before the next visit here.   Harriette Ohara, MD 04/05/18

## 2018-04-05 NOTE — Telephone Encounter (Signed)
Gave patient avs and calendar of upcoming July appointments.  °

## 2018-04-08 ENCOUNTER — Encounter: Payer: Self-pay | Admitting: General Practice

## 2018-04-08 NOTE — Progress Notes (Signed)
[place] [facility]ual [facility] Note  LVM of support and encouragement, as Kimberly Reed and I planned f/u in more detail about her AD questions. Encouraged call back, but will also try her again next week.   [place], [place], [facility] Pager [phone] Voicemail [phone]

## 2018-04-12 ENCOUNTER — Encounter: Payer: Self-pay | Admitting: General Practice

## 2018-04-12 NOTE — Progress Notes (Signed)
[place] Spiritual Care Note  LVM for f/u support as planned; encouraged call back.   [place], [place], [facility] Pager [phone] Voicemail [phone]

## 2018-04-14 ENCOUNTER — Encounter: Payer: Self-pay | Admitting: General Practice

## 2018-04-14 NOTE — Progress Notes (Signed)
[facility] Spiritual Care Note  Followed up with Charise by phone as planned. Per pt, she is starting to notice periods of feeling better--as well as a pattern of feeling tired the next day if she overdoes it. Per pt, she was able to see her therapist Friday, which was also helpful. We reviewed her last questions about AD, I provided LCSW number with encouragement for her to make appt for notarizing.  Per pt, no other needs at this time. Following for support, but please page if needs arise or circumstances change. Thank you.   [place], [place], [facility] Pager [phone] Voicemail [phone]

## 2018-04-19 ENCOUNTER — Telehealth: Payer: Self-pay | Admitting: Family Medicine

## 2018-04-19 NOTE — Telephone Encounter (Signed)
Faxed order to discontinue PT and follow Oncology recommendations.

## 2018-04-19 NOTE — Telephone Encounter (Signed)
I received orders for PT recert. This orders were initiated  prior to discovery her recurrent cancer and placed on hold until further recommendations.This order should be discontinued. Pt is following with oncology and they will order what they feel is appropriate for the patient's current needs.  Please make [facility] aware.   Howard Pouch, DO

## 2018-04-29 ENCOUNTER — Inpatient Hospital Stay (HOSPITAL_BASED_OUTPATIENT_CLINIC_OR_DEPARTMENT_OTHER): Payer: BLUE CROSS/BLUE SHIELD | Admitting: Hematology and Oncology

## 2018-04-29 ENCOUNTER — Telehealth: Payer: Self-pay | Admitting: Hematology and Oncology

## 2018-04-29 ENCOUNTER — Inpatient Hospital Stay: Payer: BLUE CROSS/BLUE SHIELD | Attending: Hematology and Oncology

## 2018-04-29 DIAGNOSIS — E039 Hypothyroidism, unspecified: Secondary | ICD-10-CM | POA: Diagnosis not present

## 2018-04-29 DIAGNOSIS — C7951 Secondary malignant neoplasm of bone: Secondary | ICD-10-CM | POA: Diagnosis not present

## 2018-04-29 DIAGNOSIS — C50412 Malignant neoplasm of upper-outer quadrant of left female breast: Secondary | ICD-10-CM

## 2018-04-29 DIAGNOSIS — C773 Secondary and unspecified malignant neoplasm of axilla and upper limb lymph nodes: Secondary | ICD-10-CM | POA: Diagnosis not present

## 2018-04-29 DIAGNOSIS — R112 Nausea with vomiting, unspecified: Secondary | ICD-10-CM | POA: Diagnosis not present

## 2018-04-29 DIAGNOSIS — G43909 Migraine, unspecified, not intractable, without status migrainosus: Secondary | ICD-10-CM | POA: Diagnosis not present

## 2018-04-29 DIAGNOSIS — C787 Secondary malignant neoplasm of liver and intrahepatic bile duct: Secondary | ICD-10-CM | POA: Diagnosis not present

## 2018-04-29 DIAGNOSIS — R42 Dizziness and giddiness: Secondary | ICD-10-CM | POA: Insufficient documentation

## 2018-04-29 DIAGNOSIS — Z79811 Long term (current) use of aromatase inhibitors: Secondary | ICD-10-CM | POA: Insufficient documentation

## 2018-04-29 DIAGNOSIS — Z170 Estrogen receptor positive status [ER+]: Secondary | ICD-10-CM

## 2018-04-29 LAB — COMPREHENSIVE METABOLIC PANEL
ALT: 61 U/L — AB (ref 0–44)
AST: 66 U/L — AB (ref 15–41)
Albumin: 3.7 g/dL (ref 3.5–5.0)
Alkaline Phosphatase: 232 U/L — ABNORMAL HIGH (ref 38–126)
Anion gap: 8 (ref 5–15)
BUN: 31 mg/dL — AB (ref 8–23)
CHLORIDE: 104 mmol/L (ref 98–111)
CO2: 26 mmol/L (ref 22–32)
CREATININE: 1.34 mg/dL — AB (ref 0.44–1.00)
Calcium: 10.4 mg/dL — ABNORMAL HIGH (ref 8.9–10.3)
GFR calc Af Amer: 48 mL/min — ABNORMAL LOW (ref >60)
GFR, EST NON AFRICAN AMERICAN: 41 mL/min — AB (ref >60)
Glucose, Bld: 113 mg/dL — ABNORMAL HIGH (ref 70–99)
POTASSIUM: 4.6 mmol/L (ref 3.5–5.1)
Sodium: 138 mmol/L (ref 135–145)
Total Bilirubin: 0.5 mg/dL (ref 0.3–1.2)
Total Protein: 7.3 g/dL (ref 6.5–8.1)

## 2018-04-29 LAB — CBC WITH DIFFERENTIAL (CANCER CENTER ONLY)
BASOS ABS: 0 10*3/uL (ref 0.0–0.1)
Basophils Relative: 2 %
EOS ABS: 0.1 10*3/uL (ref 0.0–0.5)
Eosinophils Relative: 4 %
HCT: 31.5 % — ABNORMAL LOW (ref 34.8–46.6)
Hemoglobin: 10.6 g/dL — ABNORMAL LOW (ref 11.6–15.9)
LYMPHS ABS: 1.2 10*3/uL (ref 0.9–3.3)
Lymphocytes Relative: 57 %
MCH: 33.1 pg (ref 25.1–34.0)
MCHC: 33.7 g/dL (ref 31.5–36.0)
MCV: 98.2 fL (ref 79.5–101.0)
Monocytes Absolute: 0.1 10*3/uL (ref 0.1–0.9)
Monocytes Relative: 5 %
NEUTROS PCT: 32 %
Neutro Abs: 0.6 10*3/uL — ABNORMAL LOW (ref 1.5–6.5)
PLATELETS: 212 10*3/uL (ref 145–400)
RBC: 3.2 MIL/uL — AB (ref 3.70–5.45)
RDW: 25.4 % — AB (ref 11.2–14.5)
WBC Count: 2 10*3/uL — ABNORMAL LOW (ref 3.9–10.3)

## 2018-04-29 MED ORDER — PALBOCICLIB 75 MG PO CAPS: 75.0000 mg | ORAL_CAPSULE | Freq: Every day | ORAL | 3 refills | Status: DC

## 2018-04-29 NOTE — Telephone Encounter (Signed)
Gave patient avs and calendar of upcoming august appts.  °

## 2018-04-29 NOTE — Progress Notes (Signed)
Patient Care Team: Ma Hillock, DO as PCP - General (Family Medicine) Nicholas Lose, MD as Consulting Physician (Hematology and Oncology) [name], MD as Consulting Physician (General Surgery) Eppie Gibson, MD as Attending Physician (Radiation Oncology) Particia Nearing, MD as Referring Physician (Gastroenterology) Delrae Rend, MD as Consulting Physician (Endocrinology) Kathrynn Ducking, MD as Consulting Physician (Neurology)  DIAGNOSIS:  Encounter Diagnosis  Name Primary?  . 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      05/30/2014 Breast MRI    Left Breast: 12oclock: 21 x 23 x 30 mm, Numerous  level 1 and 2 LN; Liver lesions cycts by Liver MRI      06/07/2014 Initial Biopsy    Left axillary lymph node biopsy invasive ductal carcinoma ER 100%, PR 11% Ki-67 15% HER-2 negative ratio 1.41      06/13/2014 PET scan    left breast activity, hypermetabolic left axillary and left retropectoral lymph node, small lucent lesion in L1 vertebra which was biopsied and proven to be bone metastases      07/19/2014 Initial Biopsy    Biopsy of L1 vertebra metastatic carcinoma breast primary, ER 100%, PR 4%, HER-2 negative ratio 1.3      07/26/2014 - 12/18/2014 Neo-Adjuvant Chemotherapy    Even though she has L1 solitary bone metastases, patient is being treated definitively with neoadjuvant chemotherapy with dose dense Adriamycin and Cytoxan to be followed by weekly Taxol x12      12/25/2014 PET scan    Interval improvement in size and metabolic activity associated with left  axillary lymph nodes consistent with treatment response, no hypermetabolic distant metastases, L1 vertebral body has no activity      12/25/2014 Breast MRI    No residual enhancement left breast, decreased left axillary lymphadenopathy, indicating response to treatment but lymph nodes are still present      01/30/2015 Surgery    Left breast lumpectomy: Invasive ductal carcinoma grade 2, 1.2 cm, 12/15 lymph nodes positive, extracapsular tumor extension present, T1c N3M1 stage IV       03/12/2015 - 04/24/2015 Radiation Therapy     adjuvant radiation therapy with Dr. Isidore Moos      05/16/2015 - 05/2016 Anti-estrogen oral therapy    Anastrozole 1 mg daily 10 years is the plan; [name] was recommended but she was not reachable and hence it was not filled, switched to tamoxifen 04/07/2016.  Was unable to tolerate Tamoxifen and stopped at some point in 2017, she isn't sure when.        01/10/2016 Imaging    CT chest abdomen pelvis: No evidence of metastatic disease      02/09/2018 Relapse/Recurrence    Extensive diffuse liver metastases 2.2 cm, 4.3 cm.  Hepatomegaly, bone metastases most apparent in thoracic spine, nonspecific lung nodules lower periesophageal lymph nodes suspicious      02/25/2018 Pathology Results    Liver biopsy: Metastatic breast cancer ER 100%, PR 10%, HER-2 negative ratio 1.41       03/10/2018 -  Anti-estrogen oral therapy    Ibrance with letrozole  CHIEF COMPLIANT: Follow-up on Ibrance with letrozole  INTERVAL HISTORY: Guiliana Shor is a 61 year old with above-mentioned history of metastatic breast cancer currently on Ibrance with letrozole.  We had to reduce the dosage of Ibrance to 100 mg.  And her blood counts have remained fairly stable.  She does not report any major side effects of the treatment since we reduce the dosage.  She has been able to go shopping able to walk by herself without requiring wheelchair.  The major side effect was neutropenia  .  She does  have chronic mild fatigue.  Denies any nausea vomiting. She has major emotional and psychological problems for which she has been struggling a lot.  REVIEW OF SYSTEMS:   Constitutional: Denies fevers, chills or abnormal weight loss Eyes: Denies blurriness of vision Ears, nose, mouth, throat, and face: Denies mucositis or sore throat Respiratory: Denies cough, dyspnea or wheezes Cardiovascular: Denies palpitation, chest discomfort Gastrointestinal:  Denies nausea, heartburn or change in bowel habits Skin: Denies abnormal skin rashes Lymphatics: Denies new lymphadenopathy or easy bruising Neurological:Denies numbness, tingling or new weaknesses Behavioral/Psych: Mood is stable, no new changes  Extremities: No lower extremity edema  All other systems were reviewed with the patient and are negative.  I have reviewed the past medical history, past surgical history, social history and family history with the patient and they are unchanged from previous note.  ALLERGIES:  is allergic to other; effexor [venlafaxine]; and mucinex [guaifenesin er].  MEDICATIONS:  Current Outpatient Medications  Medication Sig Dispense Refill  . acetaminophen (TYLENOL) 325 MG tablet Take 2 tablets (650 mg total) by mouth every 6 (six) hours as needed for mild pain or moderate pain (or Fever >/= 101). 30 tablet 0  . ALPRAZolam (XANAX) 0.5 MG tablet TAKE 1 TABLET BY MOUTH AT BEDTIME AS NEEDED FOR ANXIETY 30 tablet 0  . brimonidine-timolol (COMBIGAN) 0.2-0.5 % ophthalmic solution Place 1 drop into both eyes every 12 (twelve) hours.     . dorzolamide (TRUSOPT) 2 % ophthalmic solution Place 1 drop into both eyes 2 (two) times daily.     [name] letrozole (FEMARA) 2.5 MG tablet Take 1 tablet (2.5 mg total) by mouth daily. 90 tablet 3  . levothyroxine (SYNTHROID, LEVOTHROID) 150 MCG tablet TAKE 1 TABLET BY MOUTH EVERY DAY ON EMPTY STOMACH IN THE MORNING  0  . Omega-3 Fatty Acids (FISH OIL) 1000 MG CAPS Take 1,000 mg by mouth  daily.    . ondansetron (ZOFRAN-ODT) 8 MG disintegrating tablet Take 1 tablet (8 mg total) by mouth every 8 (eight) hours as needed for nausea or vomiting. 30 tablet 3  . palbociclib (IBRANCE) 75 MG capsule Take 1 capsule (75 mg total) by mouth daily with breakfast. Take whole with food. Take for 21 days on, 7 days off, repeat every 28 days. 21 capsule 3  . Travoprost, BAK Free, (TRAVATAN) 0.004 % SOLN ophthalmic solution Place 1 drop into both eyes at bedtime.      No current facility-administered medications for this visit.     PHYSICAL EXAMINATION: ECOG PERFORMANCE STATUS: 1 - Symptomatic but completely ambulatory  Vitals:   04/29/18 1147  BP: 123/75  Pulse: 68  Resp: 18  Temp: 97.6 F (36.4 C)  SpO2: 98%   Filed Weights   04/29/18 1147  Weight: 241 lb 6.4 oz (109.5 kg)    GENERAL:alert, no distress and comfortable SKIN: skin color, texture, turgor are normal, no rashes or significant lesions EYES: normal, Conjunctiva are pink and non-injected, sclera  clear OROPHARYNX:no exudate, no erythema and lips, buccal mucosa, and tongue normal  NECK: supple, thyroid normal size, non-tender, without nodularity LYMPH:  no palpable lymphadenopathy in the cervical, axillary or inguinal LUNGS: clear to auscultation and percussion with normal breathing effort HEART: regular rate & rhythm and no murmurs and no lower extremity edema ABDOMEN:abdomen soft, non-tender and normal bowel sounds MUSCULOSKELETAL:no cyanosis of digits and no clubbing  NEURO: alert & oriented x 3 with fluent speech, no focal motor/sensory deficits EXTREMITIES: No lower extremity edema   LABORATORY DATA:  I have reviewed the data as listed CMP Latest Ref Rng & Units 04/29/2018 03/24/2018 01/21/2018  Glucose 70 - 99 mg/dL 113(H) 107 93  BUN 8 - 23 mg/dL 31(H) 29(H) 14  Creatinine 0.44 - 1.00 mg/dL 1.34(H) 1.28(H) 0.93  Sodium 135 - 145 mmol/L 138 139 134(L)  Potassium 3.5 - 5.1 mmol/L 4.6 4.0 4.1  Chloride 98 - 111  mmol/L 104 107 101  CO2 22 - 32 mmol/L 26 23 19(L)  Calcium 8.9 - 10.3 mg/dL 10.4(H) 10.6(H) 9.4  Total Protein 6.5 - 8.1 g/dL 7.3 7.6 -  Total Bilirubin 0.3 - 1.2 mg/dL 0.5 0.8 -  Alkaline Phos 38 - 126 U/L 232(H) 254(H) -  AST 15 - 41 U/L 66(H) 89(H) -  ALT 0 - 44 U/L 61(H) 59(H) -    Lab Results  Component Value Date   WBC 2.0 (L) 04/29/2018   HGB 10.6 (L) 04/29/2018   HCT 31.5 (L) 04/29/2018   MCV 98.2 04/29/2018   PLT 212 04/29/2018   NEUTROABS 0.6 (L) 04/29/2018    ASSESSMENT & PLAN:  Breast cancer of upper-outer quadrant of left female breast ([place]) Metastatic breast cancer in the left breast involving left axillary lymph nodes and isolated L1 vertebral metastases stage IV disease, ER 100 percent, PR 4%, HER-2 negative ratio 1.3 Ki-67 15%  Chemotherapy summary: Completed dose dense Adriamycin Cytoxan 4 started 07/26/2014. Completed 12 weeks of Taxol by 12/18/2014. Left lumpectomy 01/30/2015: Invasive ductal carcinoma grade 2, 1.2 cm, 12/15 lymph nodes positive, extracapsular tumor extension present, T1c N3M1 stage IV Status post radiation completed 04/24/2015  CTCAP4/17/2019: Extensive diffuse hepatic metastases, secondary hepatomegaly, bone metastases in the thoracic spine, nonspecific pulmonary nodules, lower periesophageal lymph nodes Liver biopsy 02/24/2018: Metastatic breast cancer,ER 100%, PR 10%, HER-2 negative ratio 1.41  Current treatment:Ibrance with letrozole started 03/11/2018 Ibrance toxicities: 1.  Neutropenia: Today's ANC is 0.6.  Current dosage of Ibrance: 100 mg we will reduce the dosage of Ibrance to 75 mg starting next week.  Major depression: Patient sees therapist.   Return to clinic in 4 weeks for follow-up and we will perform CT chest abdomen pelvis prior to that.    Orders Placed This Encounter  Procedures  . CT Chest W Contrast    Standing Status:   Future    Standing Expiration Date:   04/29/2019    Order Specific Question:   ** REASON  FOR EXAM (FREE TEXT)    Answer:   Metastatic breast cancer restaging on treatment    Order Specific Question:   If indicated for the ordered procedure, I authorize the administration of contrast media per Radiology protocol    Answer:   Yes    Order Specific Question:   Preferred imaging location?    Answer:   [facility]    Order Specific Question:   Radiology Contrast Protocol - do NOT remove file path    Answer:   \\[email or web address].pdf  . CT  Abdomen Pelvis W Contrast    Standing Status:   Future    Standing Expiration Date:   04/29/2019    Order Specific Question:   ** REASON FOR EXAM (FREE TEXT)    Answer:   Metastatic breast cancer restaging on treatment    Order Specific Question:   If indicated for the ordered procedure, I authorize the administration of contrast media per Radiology protocol    Answer:   Yes    Order Specific Question:   Preferred imaging location?    Answer:   [facility]    Order Specific Question:   Is Oral Contrast requested for this exam?    Answer:   Yes, Per Radiology protocol    Order Specific Question:   Radiology Contrast Protocol - do NOT remove file path    Answer:   \\[email or web address].pdf  . CBC with Differential ([place] Only)    Standing Status:   Future    Standing Expiration Date:   04/30/2019  . CMP ([place] only)    Standing Status:   Future    Standing Expiration Date:   04/30/2019   The patient has a good understanding of the overall plan. she agrees with it. she will call with any problems that may develop before the next visit here.   Harriette Ohara, MD 04/29/18

## 2018-04-29 NOTE — Assessment & Plan Note (Signed)
Metastatic breast cancer in the left breast involving left axillary lymph nodes and isolated L1 vertebral metastases stage IV disease, ER 100 percent, PR 4%, HER-2 negative ratio 1.3 Ki-67 15%  Chemotherapy summary: Completed dose dense Adriamycin Cytoxan 4 started 07/26/2014. Completed 12 weeks of Taxol by 12/18/2014. Left lumpectomy 01/30/2015: Invasive ductal carcinoma grade 2, 1.2 cm, 12/15 lymph nodes positive, extracapsular tumor extension present, T1c N3M1 stage IV Status post radiation completed 04/24/2015  CTCAP4/17/2019: Extensive diffuse hepatic metastases, secondary hepatomegaly, bone metastases in the thoracic spine, nonspecific pulmonary nodules, lower periesophageal lymph nodes Liver biopsy 02/24/2018: Metastatic breast cancer,ER 100%, PR 10%, HER-2 negative ratio 1.41  Current treatment:Ibrance with letrozole started 03/11/2018 Ibrance toxicities: 1.  Neutropenia: Today's ANC has improved.  Current dosage of Ibrance: 100 mg  Major depression: Patient sees therapist.   Return to clinic in 4 weeks for follow-up

## 2018-05-04 ENCOUNTER — Telehealth: Payer: Self-pay

## 2018-05-04 NOTE — Telephone Encounter (Signed)
Returned pt's call regarding she has been having dizziness and left arm pain. She also would like to know what Dr Lindi Adie thinks about her having cataract surgery on this Monday, does she need any repeat blood work prior?, reports she didn't get to discuss at last appt.  Pt reports when she is dizzy and when she gets up she gets nauseated.  States she hasn't ate this morning or took meds yet due to nausea.  Told pt that her symptoms may need to be further evaluated at ER.  Pt declined and said she didn't want to wait in an emergency room.  Pt said her left arm pain was related to her lymphedema.  Asked her if she could try her zofran odt that is prescribed. Pt abruptly said she didn't want to talk anymore and wanted to go.  I told her I would ask Dr Lindi Adie about the upcoming cataract surgery and if she needs blood work prior and then can call her back to see if nausea med helped and to try eat or drink something.  Pt agreed.  Will speak with Dr Lindi Adie and call pt back.

## 2018-05-04 NOTE — Telephone Encounter (Signed)
Returned call from pt regarding she verbalized that she was thinking about it and wondered how she would know if she is developing an infection.  Went over signs of infection ie fever 100.4 or greater, chills, sweating, neck pain, sore throat, cough, abd pain, worsening vomiting ie not able to keep any liquids or food down, diarrhea- pt voiced understanding.  I let pt know if she has these symptoms or present symptoms worsen then she may need to call 911, pt said in higher voice " I am far from needing 911".  Went over more about cooking meats thoroughly even eggs; avoiding public bathrooms if possible, and good hand hygiene. Pt voiced understanding. She reported she has has pop sickles and will try that, will try zofran again, she's not sure it stayed down,  reminded her that it's dissolvable type, then wait 30 min and try po's.  I reminded pt that I would call her tomorrow regarding making lab appt as ordered per Dr Lindi Adie.  No other needs per pt at this time.

## 2018-05-04 NOTE — Telephone Encounter (Signed)
Notified Dr Lindi Adie regarding pt's nausea, dizziness, left arm pain she thinks is her lymphedema, (see last telephone note).  Outgoing call to patient, I let her know that I made Dr Lindi Adie aware of her symptoms and he would like her to have lab work repeated one day prior to her cataract surgery (Friday). Pt reports she vomited earlier, 'looked like water'.  Encouraged pt to be seen if symptoms worsen and said if she is not able to drive herself, then she may have to call 911, pt responded and said she thinks she is feeling better after vomiting. Pt asked about nutrition since her WBC is low.  Informed pt of neutropenic precautions, discussed avoiding fresh fruit or fresh veggies right now and try cooked veggies or canned or in container like apple sauce or fruit bites. Pt said she was not aware of that and now thinks the salad she ate yesterday bothered her stomach.  Also spoke to her about avoiding crowds and wearing mask to protect herself since her immunity is low, pt said she prefers not to wear a mask and 'doesn't go out much'. Pt would like to have the blood work per Dr Lindi Adie tomorrow instead of Friday as she would like to have time to cancel her cataract surgery for Monday if needed.  Pt would appreciate a call back in the morning and if feeling better, would like lab appt tomorrow or Friday.  Nurse for Dr Lindi Adie will touch base with pt in am.  No other needs per pt at this time.

## 2018-05-05 ENCOUNTER — Telehealth: Payer: Self-pay

## 2018-05-05 NOTE — Telephone Encounter (Signed)
Returned pt's call regarding she said she spoke to someone who said she might be dehydrated and in need of IV fluids.  I explained to pt that we have discussed the option of being seen here or symptom management but pt said she could not drive in and didn't have a ride here. Reminded pt also that she previously verbalized not wanting to go through ER or call 911.  Pt agreed and said reports she isn't going to wait in the ER like she has in past.  Pt said she has not vomited today and wants to try to take in more fluids own her own. Pt said she's got a big jug of water and has been sipping that and not vomited.  Suggested other fluids like ginger ale or gatoraid. Pt reports she has ginger ale and said she will try that and will try to eat something small. Pt reports she has tried nausea med again and hasn't vomited that either.  As previous telephone note said, she would appreciate a call back from nurse in morning to schedule lab appt and possible appt with symptom management if needed tomorrow.  No other needs per pt at this time.

## 2018-05-05 NOTE — Telephone Encounter (Addendum)
Outgoing call to patient regarding she wanted a call-back this morning to see if she is feeling well enough today to come in for repeat blood work prior to her cataract surgery on Monday July 15th.  Pt reports she was able to eat a pop sickle through the night, drink water, and eat some tortilla chips this morning. Reports everything has stayed down, no vomiting.  She verbalized she thinks now how she is feeling is due to a migraine.  Reports she has taken Excedrin migraine and that stayed down and reports she is not as dizzy today.  Pt prefers not to have her labwork today but would appreciate a call back tomorrow morning from the nurse and see if pt is feeling better and can come tomorrow for repeat labs per Dr Lindi Adie.  Pt will call her eye doctor and let them know the possibility that she may reschedule the cataract surgery to the following Monday, depending on how she feels and results of her bloodwork.  No other needs per pt at this time. She verbalized will continue to drink fluids especially water and continue to try small amts of food, bland at first.

## 2018-05-06 ENCOUNTER — Telehealth: Payer: Self-pay

## 2018-05-06 NOTE — Telephone Encounter (Signed)
Outgoing call to patient regarding she wanted the nurse to call back this morning to make the lab appt for today for repeat labs per Dr Lindi Adie prior to her scheduled cataract surgery for Monday July 15th.  Pt verbalized that she may have to reschedule her cataract surgery because she is still not feeling well. Said she is walking around her house now without being dizzy but is still very nauseated.   She asked about an appt in symptom management today and I offered to schedule her there or for her lab appt.   Pt then verbalized she will call back later that she didn't feel like talking. No other needs per pt at this time.

## 2018-05-09 ENCOUNTER — Telehealth: Payer: Self-pay

## 2018-05-09 ENCOUNTER — Other Ambulatory Visit: Payer: Self-pay

## 2018-05-09 DIAGNOSIS — C50412 Malignant neoplasm of upper-outer quadrant of left female breast: Secondary | ICD-10-CM

## 2018-05-09 MED ORDER — PROCHLORPERAZINE MALEATE 10 MG PO TABS: 10.0000 mg | ORAL_TABLET | Freq: Four times a day (QID) | ORAL | 0 refills | Status: DC | PRN

## 2018-05-09 NOTE — Telephone Encounter (Signed)
Received call from pt today reporting that she had to cancel her cataract surgery today due to her nausea and severe migraine that she had been experiencing x1 week. Pt was in contact with our office last week. Pt was restarted on Ibrance at a decreased dose of 75mg  due to lower ANC. Pt was instructed last week to see symptom management but had refused.   Pt would like to come in and see SM tomorrow and get labs checked as well. Pt had been inconsistent with taking Ibrance d/t nausea. She took zofran x2 but did not help with resolving symptoms. Pt refused to take it again. Will send in compazine for pt. Instructed pt to also take OTC excedrin for her migraines, as it can contribute to nausea. Pt verbalized understanding.   Confirmed time/date of appt tomorrow. Dr.Gudena notified about pt not taking Ibrance and is aware. Ok to see SM tomorrow per MD.

## 2018-05-10 ENCOUNTER — Inpatient Hospital Stay: Payer: BLUE CROSS/BLUE SHIELD

## 2018-05-10 ENCOUNTER — Inpatient Hospital Stay (HOSPITAL_BASED_OUTPATIENT_CLINIC_OR_DEPARTMENT_OTHER): Payer: BLUE CROSS/BLUE SHIELD | Admitting: Medical

## 2018-05-10 VITALS — BP 142/85 | HR 68 | Temp 98.7°F | Resp 17 | Ht 65.0 in

## 2018-05-10 DIAGNOSIS — C50412 Malignant neoplasm of upper-outer quadrant of left female breast: Secondary | ICD-10-CM | POA: Diagnosis not present

## 2018-05-10 DIAGNOSIS — R112 Nausea with vomiting, unspecified: Secondary | ICD-10-CM

## 2018-05-10 DIAGNOSIS — R42 Dizziness and giddiness: Secondary | ICD-10-CM | POA: Diagnosis not present

## 2018-05-10 DIAGNOSIS — G4489 Other headache syndrome: Secondary | ICD-10-CM

## 2018-05-10 DIAGNOSIS — G43909 Migraine, unspecified, not intractable, without status migrainosus: Secondary | ICD-10-CM | POA: Diagnosis not present

## 2018-05-10 LAB — CBC WITH DIFFERENTIAL (CANCER CENTER ONLY)
BASOS ABS: 0.1 10*3/uL (ref 0.0–0.1)
BASOS PCT: 3 %
EOS PCT: 2 %
Eosinophils Absolute: 0.1 10*3/uL (ref 0.0–0.5)
HCT: 34.7 % — ABNORMAL LOW (ref 34.8–46.6)
Hemoglobin: 11.6 g/dL (ref 11.6–15.9)
Lymphocytes Relative: 40 %
Lymphs Abs: 1.2 10*3/uL (ref 0.9–3.3)
MCH: 33.2 pg (ref 25.1–34.0)
MCHC: 33.5 g/dL (ref 31.5–36.0)
MCV: 98.9 fL (ref 79.5–101.0)
MONO ABS: 0.3 10*3/uL (ref 0.1–0.9)
Monocytes Relative: 11 %
Neutro Abs: 1.3 10*3/uL — ABNORMAL LOW (ref 1.5–6.5)
Neutrophils Relative %: 44 %
Platelet Count: 308 10*3/uL (ref 145–400)
RBC: 3.5 MIL/uL — ABNORMAL LOW (ref 3.70–5.45)
RDW: 24.1 % — AB (ref 11.2–14.5)
WBC Count: 3 10*3/uL — ABNORMAL LOW (ref 3.9–10.3)

## 2018-05-10 LAB — CMP (CANCER CENTER ONLY)
ALBUMIN: 3.8 g/dL (ref 3.5–5.0)
ALK PHOS: 200 U/L — AB (ref 38–126)
ALT: 33 U/L (ref 0–44)
AST: 65 U/L — AB (ref 15–41)
Anion gap: 9 (ref 5–15)
BILIRUBIN TOTAL: 0.6 mg/dL (ref 0.3–1.2)
BUN: 21 mg/dL (ref 8–23)
CALCIUM: 10.7 mg/dL — AB (ref 8.9–10.3)
CO2: 25 mmol/L (ref 22–32)
Chloride: 102 mmol/L (ref 98–111)
Creatinine: 1.09 mg/dL — ABNORMAL HIGH (ref 0.44–1.00)
GFR, Est AFR Am: >60 mL/min (ref >60)
GFR, Estimated: 53 mL/min — ABNORMAL LOW (ref >60)
GLUCOSE: 103 mg/dL — AB (ref 70–99)
Potassium: 4.1 mmol/L (ref 3.5–5.1)
Sodium: 136 mmol/L (ref 135–145)
TOTAL PROTEIN: 7.8 g/dL (ref 6.5–8.1)

## 2018-05-10 MED ORDER — DEXAMETHASONE 4 MG PO TABS: 4.0000 mg | ORAL_TABLET | Freq: Two times a day (BID) | ORAL | 0 refills | Status: DC

## 2018-05-10 MED ORDER — KETOROLAC TROMETHAMINE 30 MG/ML IJ SOLN
30.0000 mg | Freq: Once | INTRAMUSCULAR | Status: AC
  Administered 2018-05-10: 30 mg via INTRAMUSCULAR

## 2018-05-10 MED ORDER — KETOROLAC TROMETHAMINE 30 MG/ML IJ SOLN
INTRAMUSCULAR | Status: AC
  Filled 2018-05-10: qty 1

## 2018-05-10 NOTE — Progress Notes (Signed)
[facility] Progress Note   Kimberly Reed [id number] 1955-11-11 62 y.o.  Kimberly Reed is managed by Kimberly Reed  Actively treated with chemotherapy/immunotherapy: yes  Current Therapy:  Ibrance with letrozole.  Assessment: Plan:    Migraine without status migrainosus, not intractable, unspecified migraine type - Plan: ketorolac (TORADOL) 30 MG/ML injection 30 mg  Non-intractable vomiting with nausea, unspecified vomiting type  Vertigo  Malignant neoplasm of upper-outer quadrant of left female breast, unspecified estrogen receptor status (HCC)  Other headache syndrome   Migraines, nausea and vomiting, and vertigo: The patient was instructed to pick up the prescription for Compazine that have been sent to her pharmacy.  She was given a prescription for Decadron 4 mg p.o. twice daily.  She was dosed with Toradol 30 mg IM.  She will be referred for a head CT.  She does not want to have an MRI of the brain at this time.  I told her that I was concerned that she may likely have to have an MRI since a CT scan will need to be completed without contrast due to her elevated creatinine.  Metastatic malignant neoplasm of the left breast: The patient continues to be followed by Kimberly Reed and is treated with Kimberly Reed and letrozole.  The patient was instructed to restart her letrozole once her nausea is better controlled.  Please see After Visit Summary for patient specific instructions.  Future Appointments  Date Time Provider [place]  05/27/2018  9:30 AM [facility] 5 [facility] None  05/27/2018 10:00 AM Kimberly Lose, MD [facility] None    No orders of the defined types were placed in this encounter.      Subjective:   Patient ID:  Kimberly Reed is a 62 y.o. (DOB [birth year]) female.  Chief Complaint:  Chief Complaint  Patient presents with  . Nausea    HPI [name] is a 62 year old female with a history of a metastatic ER positive  malignant neoplasm of the left breast who is managed by Kimberly Reed.  She has liver metastasis.  She is currently treated with Ibrance with letrozole.  She was last seen by Kimberly Reed on 04/29/2018.  Kimberly Reed contacted our office yesterday stating that had to cancel her cataract surgery on Monday due to her nausea and severe migraines that she had been experiencing x1 week. She was restarted on Ibrance last week at a decreased dose of 75mg  due to lower ANC. She contacted our office last week regarding her nausea and headache and was offered an appointment with see symptom management which she declined. According to a chart note from yesterday, Kimberly Reed has been inconsistent with taking Ibrance due to nausea. She took zofran x2 which did not help with resolving symptoms. She declined to take another dose. A prescription for compazine was sent to her pharmacy. She was instructed to also take OTC Excedrin for her migraines but was cautioned that it could contribute to her nausea.  She reports that she has constant nausea.  She has been vomiting since last Wednesday.  She saw Kimberly Reed on 04/29/2018.  She restarted Ibrance 05/03/2009.  She took Zofran without improvement in her nausea and vomiting.  She reports her vertigo with movement of her head.  She has had a migraine for the past week.  She reports having a history of dizziness but now has vertigo.  She is scheduled for upcoming cataract surgery.  Medications: I have reviewed the patient's current medications.  Allergies:  Allergies  Allergen Reactions  . Other Nausea Only and Other (See Comments)    Antibiotics - Pt cannot identify which antibiotics she reacts to.  Make her nausous and gives her headache.  . Effexor [Venlafaxine] Nausea And Vomiting  . Mucinex [Guaifenesin Er] Nausea Only    Past Medical History:  Diagnosis Date  . Anemia   . Anxiety   . Atypical chest pain   . Breast cancer ([place]) 05/2014   left. completed  chemo 10/2014. Did not tolerate anti-estrogen.   . Cancer ([place])    left breast dx. 3'64- chemo planned to start 07-09-14- [facility]  . Depression   . Family history of heart disease   . Glaucoma   . Gluten intolerance    Dr. Collene Mares  . Headache syndrome 10/13/2016  . History of syncope 2008   loss of bladder control eval by Neuro  . Hot flashes   . Hyperlipemia   . Hypothyroid   . Insomnia   . Lymphedema    L arm  . Migraine without aura   . Personal history of chemotherapy   . Personal history of radiation therapy   . Shingles 2018  . Wears glasses     Past Surgical History:  Procedure Laterality Date  . BREAST BIOPSY Left 06/07/2014   malignant  . BREAST BIOPSY Left 05/24/2014   malignant  . BREAST LUMPECTOMY Left 01/30/2015   malignant  . BREAST LUMPECTOMY WITH NEEDLE LOCALIZATION AND AXILLARY LYMPH NODE DISSECTION Left 01/30/2015   Procedure: BREAST LUMPECTOMY WITH NEEDLE LOCALIZATION LEFT AXILLARY DISSECTION REMOVAL OF PORT;  Surgeon: [name], MD;  Location: [facility];  Service: General;  Laterality: Left;  . CATARACT EXTRACTION Left   . DILATION AND CURETTAGE OF UTERUS    . ESOPHAGOGASTRODUODENOSCOPY  06/01/14  . EXAMINATION UNDER ANESTHESIA  02/24/2013   Procedure: EXAM UNDER ANESTHESIA;  Surgeon: Azalia Bilis, MD;  Location: [facility];  Service: Gynecology;;  with removal of prolapsing cervical mass; pap smear and endometrial biopsy  . GLAUCOMA SURGERY Left    x1   . PORTACATH PLACEMENT Right 07/06/2014   Procedure: PORT A CATH  PLACEMENT;  Surgeon: [name], MD;  Location: [facility];  Service: General;  Laterality: Right;  . TONSILLECTOMY     child  . uterine polyp removed     per hysteroscopy about 1 1/2 yrs ago.    Family History  Problem Relation Age of Onset  . Kidney disease Mother   . Depression Mother   . Early death Mother   . Hypertension Mother   . Heart attack Mother   . Aortic aneurysm Mother     . Hypertension Father   . Dementia Father   . Depression Father   . Kidney disease Brother   . Hypertension Sister   . Heart disease Unknown        "all of moms side"  . Depression Unknown   . Heart failure Cousin     Social History   Socioeconomic History  . Marital status: Single    Spouse name: Not on file  . Number of children: 0  . Years of education: Some college  . Highest education level: Not on file  Occupational History  . 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 Financial resource strain: Not on file  . Food insecurity:    Worry: Not on  file    Inability: Not on file  . Transportation needs:    Medical: Not on file    Non-medical: Not on file  Tobacco Use  . Smoking status: Never Smoker  . Smokeless tobacco: Never Used  Substance and Sexual Activity  . Alcohol use: No  . Drug use: No    Comment: in 20's but not now  . Sexual activity: Not Currently    Comment: menarche age 62, P 0, no HRT  Lifestyle  . Physical activity:    Days per week: Not on file    Minutes per session: Not on file  . Stress: Not on file  Relationships  . Social connections:    Talks on phone: Not on file    Gets together: Not on file    Attends religious service: Not on file    Active member of club or organization: Not on file    Attends meetings of clubs or organizations: Not on file    Relationship status: Not on file  . Intimate partner violence:    Fear of current or ex partner: Not on file    Emotionally abused: Not on file    Physically abused: Not on file    Forced sexual activity: Not on file  Other Topics Concern  . Not on file  Social History Narrative   Single.  College.  Lives alone.  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   Right-handed   Dairy products, takes a daily vitamin, drinks caffeine, uses herbal remedies.   Alarm at Reed.  Wears her seatbelt.       Past  Medical History, Surgical history, Social history, and Family history were reviewed and updated as appropriate.   Please see review of systems for further details on the patient's review from today.   Review of Systems:  Review of Systems  Constitutional: Positive for appetite change. Negative for chills, diaphoresis and fever.  Respiratory: Negative for cough, choking, shortness of breath and wheezing.   Cardiovascular: Negative for chest pain and palpitations.  Gastrointestinal: Positive for nausea and vomiting. Negative for constipation and diarrhea.  Genitourinary: Negative for decreased urine volume.  Neurological: Positive for weakness and headaches.       Vertigo    Objective:   Physical Exam:  BP (!) 142/85 (BP Location: Right Arm, Patient Position: Sitting) Comment: nurse aware of bp  Pulse 68   Temp 98.7 F (37.1 C) (Oral)   Resp 17   Ht 5\' 5"  (1.651 m)   LMP 02/15/2013   SpO2 99%   BMI 40.17 kg/m  ECOG: 1  Physical Exam  Constitutional: No distress.  The patient is an animated adult female who appears to be in no acute distress.  HENT:  Head: Normocephalic and atraumatic.  Mouth/Throat: Oropharynx is clear and moist. No oropharyngeal exudate.  Neck: Normal range of motion. Neck supple.  Cardiovascular: Normal rate, regular rhythm and normal heart sounds. Exam reveals no gallop and no friction rub.  No murmur heard. Pulmonary/Chest: Effort normal and breath sounds normal. No stridor. No respiratory distress. She has no wheezes. She has no rales.  Abdominal: Soft. Bowel sounds are normal. She exhibits no distension and no mass. There is no tenderness. There is no rebound and no guarding.  Neurological: She is alert. Coordination (The patient is ambulating with the use of a wheelchair.) abnormal.  Skin: Skin is warm and dry. No rash noted. She is not diaphoretic. No erythema.  Lab Review:     Component Value Date/Time   NA 136 05/10/2018 1134   NA 141  08/10/2017   NA 141 01/10/2016 1539   K 4.1 05/10/2018 1134   K 3.8 01/10/2016 1539   CL 102 05/10/2018 1134   CO2 25 05/10/2018 1134   CO2 29 01/10/2016 1539   GLUCOSE 103 (H) 05/10/2018 1134   GLUCOSE 115 01/10/2016 1539   BUN 21 05/10/2018 1134   BUN 18 08/10/2017   BUN 18.1 01/10/2016 1539   CREATININE 1.09 (H) 05/10/2018 1134   CREATININE 1.0 01/10/2016 1539   CALCIUM 10.7 (H) 05/10/2018 1134   CALCIUM 10.1 01/10/2016 1539   PROT 7.8 05/10/2018 1134   PROT 7.4 01/10/2016 1539   ALBUMIN 3.8 05/10/2018 1134   ALBUMIN 4.1 01/10/2016 1539   AST 65 (H) 05/10/2018 1134   AST 15 01/10/2016 1539   ALT 33 05/10/2018 1134   ALT 17 01/10/2016 1539   ALKPHOS 200 (H) 05/10/2018 1134   ALKPHOS 92 01/10/2016 1539   BILITOT 0.6 05/10/2018 1134   BILITOT 0.50 01/10/2016 1539   GFRNONAA 53 (L) 05/10/2018 1134   GFRAA >60 05/10/2018 1134       Component Value Date/Time   WBC 3.0 (L) 05/10/2018 1134   WBC 9.8 02/24/2018 1145   RBC 3.50 (L) 05/10/2018 1134   HGB 11.6 05/10/2018 1134   HGB 12.9 01/10/2016 1538   HCT 34.7 (L) 05/10/2018 1134   HCT 38.9 01/10/2016 1538   PLT 308 05/10/2018 1134   PLT 285 01/10/2016 1538   MCV 98.9 05/10/2018 1134   MCV 90.9 01/10/2016 1538   MCH 33.2 05/10/2018 1134   MCHC 33.5 05/10/2018 1134   RDW 24.1 (H) 05/10/2018 1134   RDW 15.1 (H) 01/10/2016 1538   LYMPHSABS 1.2 05/10/2018 1134   LYMPHSABS 1.7 01/10/2016 1538   MONOABS 0.3 05/10/2018 1134   MONOABS 0.5 01/10/2016 1538   EOSABS 0.1 05/10/2018 1134   EOSABS 0.1 01/10/2016 1538   BASOSABS 0.1 05/10/2018 1134   BASOSABS 0.0 01/10/2016 1538   -------------------------------  Imaging from last 24 hours (if applicable):  Radiology interpretation: No results found.

## 2018-05-10 NOTE — Progress Notes (Signed)
Pt presents with dizziness and 'feeling like my head is swimming, more than usual', as well as nausea w/intermittent vomiting.  States that she tried zofran but kept throwing it up.  Has not started compazine regimen prescribed yesterday.  Denies blurry vision or headache.  A&Ox4.

## 2018-05-10 NOTE — Patient Instructions (Signed)
Nausea, Adult Feeling sick to your stomach (nausea) means that your stomach is upset or you feel like you have to throw up (vomit). Feeling sick to your stomach is usually not serious, but it may be an early sign of a more serious medical problem. As you feel sicker to your stomach, it can lead to throwing up (vomiting). If you throw up, or if you are not able to drink enough fluids, there is a risk of dehydration. Dehydration can make you feel tired and thirsty, have a dry mouth, and pee (urinate) less often. Older adults and people who have other diseases or a weak defense (immune) system have a higher risk of dehydration. The main goal of treating this condition is to:  Limit how often you feel sick to your stomach.  Prevent throwing up and dehydration.  Follow these instructions at home: Follow instructions from your doctor about how to care for yourself at home. Eating and drinking Follow these recommendations as told by your doctor:  Take an oral rehydration solution (ORS). This is a drink that is sold at pharmacies and stores.  Drink clear fluids in small amounts as you are able, such as: ? Water. ? Ice chips. ? Fruit juice that has water added (diluted fruit juice). ? Low-calorie sports drinks.  Eat bland, easy to digest foods in small amounts as you are able, such as: ? Bananas. ? Applesauce. ? Rice. ? Lean meats. ? Toast. ? Crackers.  Avoid drinking fluids that contain a lot of sugar or caffeine.  Avoid alcohol.  Avoid spicy or fatty foods.  General instructions  Drink enough fluid to keep your pee (urine) clear or pale yellow.  Wash your hands often. If you cannot use soap and water, use hand sanitizer.  Make sure that all people in your household wash their hands well and often.  Rest at home while you get better.  Take over-the-counter and prescription medicines only as told by your doctor.  Breathe slowly and deeply when you feel sick to your  stomach.  Watch your condition for any changes.  Keep all follow-up visits as told by your doctor. This is important. Contact a doctor if:  You have a headache.  You have new symptoms.  You feel sicker to your stomach.  You have a fever.  You feel light-headed or dizzy.  You throw up.  You are not able to keep fluids down. Get help right away if:  You have pain in your chest, neck, arm, or jaw.  You feel very weak or you pass out (faint).  You have throw up that is bright red or looks like coffee grounds.  You have bloody or black poop (stools), or poop that looks like tar.  You have a very bad headache, a stiff neck, or both.  You have very bad pain, cramping, or bloating in your belly.  You have a rash.  You have trouble breathing or you are breathing very quickly.  Your heart is beating very quickly.  Your skin feels cold and clammy.  You feel confused.  You have pain while peeing.  You have signs of dehydration, such as: ? Dark pee, or very little or no pee. ? Cracked lips. ? Dry mouth. ? Sunken eyes. ? Sleepiness. ? Weakness. These symptoms may be an emergency. Do not wait to see if the symptoms will go away. Get medical help right away. Call your local emergency services (911 in the [place] Do not drive yourself to   the hospital. This information is not intended to replace advice given to you by your health care provider. Make sure you discuss any questions you have with your health care provider. Document Released: 10/01/2011 Document Revised: 03/19/2016 Document Reviewed: 06/18/2015 Elsevier Interactive Patient Education  2018 Elsevier Inc.  

## 2018-05-11 ENCOUNTER — Telehealth: Payer: Self-pay | Admitting: *Deleted

## 2018-05-11 NOTE — Telephone Encounter (Signed)
This RN spoke with pt per her call/communication with [facility] - she had further inquiry not related to issues she was seen for by [facility].  Kimberly Reed states she is scheduled for 2nd cataract surgery this coming Monday and wanted to verify due to issues if she should proceed- She then asked if she should proceed with taking her Ibrance.  Noted per Dr Yvette Rack dictation for 7/5 pt was to start a lower dose ( 75 mg ) last week- which per this RN inquiry [name] states she has not started.  This RN informed pt to proceed with scheduled cataract surgery as scheduled and informed her to start the new lower dose of Ibrance on Tuesday the day post the surgery.  [name] stated " that makes a lot of sense and I feel good about that - thank you "  No further needs at this time.

## 2018-05-16 ENCOUNTER — Encounter: Payer: Self-pay | Admitting: General Practice

## 2018-05-16 ENCOUNTER — Telehealth: Payer: Self-pay

## 2018-05-16 NOTE — Progress Notes (Signed)
[place] Spiritual Care Note  Kimberly Reed and friend Kimberly Reed presented to [place] this morning seeking spiritual/emotional support. Met with them for ca 90 min to help Kimberly Reed begin to identify and verbalize fears, goals, and barriers to "coming to terms with" mortality and EOL.  Helped Kimberly Reed slow down her speaking and reporting about the past in order to sit with the present emotions. This helps her acknowledge and begin to process her feelings instead of--as she notes--avoiding them. Per Kimberly Reed's request for support re spiritual and practical preparation for death and dying, we have set up appts for the next five Monday afternoons. For the first session, we plan to focus on identifying and physically writing a list of her goals and themes to address in future sessions.   Kimberly Reed, [place], [facility] Pager [phone] Voicemail [phone]

## 2018-05-16 NOTE — Telephone Encounter (Signed)
Received call from pt reporting that she had cancelled her cataract surgery this morning for the 2nd time because she did not feel well. She was feeling nauseated and threw up yesterday. Pt came to see symptom management last Friday and was supposed to have CT head scheduled. Sent msg for prior auth and gave pt number to call and schedule her appt.  Pt states that she is still currently off of her ibrance and will try to resume again tomorrow. She refused to take her home antiemetics d/t side effects of nausea. She complains of burning from her stomach, up her throat and becomes very nauseous after.Pt encouraged to take prilosec otc and take in the morning 30 min before eating 1-2x daily. May take 2-3 weeks for full effects. Suggested that pt eat smaller meals throughout the day and to stay away from caffeine, greasy, spicy and acidic foods. Bland diet would be a better choice for her at this time. Also, encouraged pt to refrain from laying down after eating. Suggested that pt avoid laying down after eating for 30 min to an hr and to prop her pillows when sleeping.   Spent over 40min over the phone with pt due pt rushing through her words and repeating her story over and over. Pt had difficulty concentrating on her story and keeps jumping to different subjects throughout the conversation.Redirected pt and told her to try nursing advice, since she is refusing to see Symptom management or NP at this time. Told to call by end of this week to report how she is doing with nausea and acid reflux. Told pt to keep a log diary of symptoms with day, date/time and what she was doing to remember. She will also need to restart the Ibrance medication tomorrow.   No further needs at this time.

## 2018-05-23 ENCOUNTER — Ambulatory Visit: Payer: BLUE CROSS/BLUE SHIELD | Admitting: Neurology

## 2018-05-23 ENCOUNTER — Encounter: Payer: Self-pay | Admitting: *Deleted

## 2018-05-23 ENCOUNTER — Encounter: Payer: Self-pay | Admitting: General Practice

## 2018-05-23 ENCOUNTER — Telehealth: Payer: Self-pay

## 2018-05-23 NOTE — Telephone Encounter (Signed)
Returned pt's call regarding she has an appt coming up on August 5th and thinks she needs to schedule it for later and she also has a question about her last visit, she verbalized she thinks Dr Lindi Adie said 2 years, or 2-4 years regarding her medication but she is unsure.  When I called pt, I let her know I was calling to reschedule her Aug 5 th appt if that is what she needs - pt said what about my other question regarding 2 or 2-4 years for medication.  I explained that may be regarding her Letrozole but I was unable to find it in Dr Geralyn Flash notes - pt said she was driving and not able to talk right now.  I told pt to call back when she was able.  Pt voiced understanding and no other needs per pt at this time.

## 2018-05-23 NOTE — Progress Notes (Signed)
[facility] [place] Social Work  Clinical Social Work was referred by patient to review and complete healthcare advance directives.  Clinical Social Worker met with patient in [place] office.  The patient designated Andris Flurry as their primary healthcare agent and [employer] as their secondary agent.  Patient also completed healthcare living will.    Clinical Social Worker notarized documents and made copies for patient/family. Clinical Social Worker will send documents to medical records to be scanned into patient's chart. Clinical Social Worker encouraged patient/family to contact with any additional questions or concerns.  Johnnye Lana, MSW, LCSW, OSW-C Clinical Social Worker [facility] [phone]

## 2018-05-24 ENCOUNTER — Telehealth: Payer: Self-pay

## 2018-05-24 NOTE — Telephone Encounter (Signed)
Returned pt's call regarding she may need to change her upcoming appt with Dr Lindi Adie for this Friday.  Called pt back and no answer, left her a VM - she has appt tomorrow for CT then appt for lab/office visit on Friday- reminded her of these appts on her VM and let her know to call us back if needs to reschedule either one.

## 2018-05-24 NOTE — Progress Notes (Signed)
[place] Spiritual Care Note  Had abbreviated meeting with Marvelene because she needed to leave early in order to arrange an agency ride to rescheduled cataract surgery. Her focus today was sharing about her therapist's helping her to find her voice and power in advocating for herself and engaging in her healthcare. Per Sergio, this work relates to issues that derive from family-of-origin relationships and roles. Provided pastoral listening and reflection. We plan to meet again Monday to address other tasks and goals she has related to "coming to terms" with dx and px.   [place], [place], [facility] Pager [phone] Voicemail [phone]

## 2018-05-25 ENCOUNTER — Telehealth: Payer: Self-pay

## 2018-05-25 NOTE — Telephone Encounter (Signed)
Pt called to cancel and reschedule her lab and [facility] appt's on 8/2 d/t her last day of [name] will be on 8/9.  New appt's made for pt on 8/12.  Pt aware of new date/time

## 2018-05-26 ENCOUNTER — Ambulatory Visit (HOSPITAL_COMMUNITY)
Admission: RE | Admit: 2018-05-26 | Discharge: 2018-05-26 | Disposition: A | Discharge status: Home / Self Care | Payer: BLUE CROSS/BLUE SHIELD | Source: Ambulatory Visit | Attending: Hematology and Oncology | Admitting: Hematology and Oncology

## 2018-05-26 ENCOUNTER — Other Ambulatory Visit: Payer: Self-pay | Admitting: *Deleted

## 2018-05-26 ENCOUNTER — Telehealth: Payer: Self-pay | Admitting: *Deleted

## 2018-05-26 DIAGNOSIS — C50412 Malignant neoplasm of upper-outer quadrant of left female breast: Secondary | ICD-10-CM | POA: Diagnosis not present

## 2018-05-26 DIAGNOSIS — G43909 Migraine, unspecified, not intractable, without status migrainosus: Secondary | ICD-10-CM

## 2018-05-26 DIAGNOSIS — R42 Dizziness and giddiness: Secondary | ICD-10-CM

## 2018-05-26 DIAGNOSIS — R112 Nausea with vomiting, unspecified: Secondary | ICD-10-CM

## 2018-05-26 DIAGNOSIS — R918 Other nonspecific abnormal finding of lung field: Secondary | ICD-10-CM | POA: Insufficient documentation

## 2018-05-26 DIAGNOSIS — C787 Secondary malignant neoplasm of liver and intrahepatic bile duct: Secondary | ICD-10-CM | POA: Diagnosis not present

## 2018-05-26 DIAGNOSIS — Z170 Estrogen receptor positive status [ER+]: Secondary | ICD-10-CM | POA: Diagnosis present

## 2018-05-26 DIAGNOSIS — G4489 Other headache syndrome: Secondary | ICD-10-CM

## 2018-05-26 MED ORDER — IOPAMIDOL (ISOVUE-300) INJECTION 61%
INTRAVENOUS | Status: AC
  Filled 2018-05-26: qty 100

## 2018-05-26 MED ORDER — IOPAMIDOL (ISOVUE-300) INJECTION 61%
100.0000 mL | Freq: Once | INTRAVENOUS | Status: AC | PRN
  Administered 2018-05-26: 100 mL via INTRAVENOUS

## 2018-05-26 NOTE — Telephone Encounter (Signed)
VM left by pt stating she needs to reschedule her appointments on 8/12 " because I will be having my cataract surgery and then they need to see me for follow up "  Note pt called yesterday and rescheduled to above date due to dates of Ibrance cycle and lab checks.  Pt had CT today.  This RN did not send a request to reschedule at this time - will review pt's scheduling request with MD for best follow up post CT scans done today.

## 2018-05-27 ENCOUNTER — Ambulatory Visit: Payer: Self-pay | Admitting: Nurse Practitioner

## 2018-05-27 ENCOUNTER — Inpatient Hospital Stay: Payer: BLUE CROSS/BLUE SHIELD

## 2018-05-27 ENCOUNTER — Ambulatory Visit: Payer: BLUE CROSS/BLUE SHIELD | Admitting: Neurology

## 2018-05-27 ENCOUNTER — Encounter

## 2018-05-27 ENCOUNTER — Inpatient Hospital Stay: Payer: BLUE CROSS/BLUE SHIELD | Admitting: Hematology and Oncology

## 2018-05-30 ENCOUNTER — Encounter: Payer: Self-pay | Admitting: General Practice

## 2018-05-30 NOTE — Progress Notes (Signed)
[place] Note  Met with Kimberly Reed in my office as scheduled to make space for her to process thoughts and feelings about mortality and anticipating her own EOL at some point due to metastatic ca. In our last two or three contacts I have seen a shift in Kimberly Reed's sense of personal agency--she seems to be activating her own power to tell and interpret her own life story, as well as to formulate her own response and to name her personal goals and priorities. Named and affirmed this for her, encouraging her to take particular lines of reflection back to her therapist to explore her insights in more detail.   Next Monday Kimberly Reed is scheduled to have cataract surgery, but we plan to f/u the following Monday. Her goal for that session is to continue life review with an eye toward ways in which she has made meaning, healed through past challenges, interrupted family-of-origin cycles, and met her own goals or hopes for her life. This is part of reframing her life as "trash" or "a waste" so that she can identify, verbalize, and perhaps ultimately celebrate or derive peace from her accomplishments and contributions.   [place], [place], [facility] Pager [phone] Voicemail [phone]

## 2018-05-31 ENCOUNTER — Telehealth: Payer: Self-pay

## 2018-05-31 ENCOUNTER — Telehealth: Payer: Self-pay | Admitting: Hematology and Oncology

## 2018-05-31 NOTE — Telephone Encounter (Signed)
Returned patient's call regarding need for rescheduling appointments d/t upcoming cataract surgery.  Patient notified that scheduling will be contacting her to change appointments.  No further needs at this time.

## 2018-05-31 NOTE — Telephone Encounter (Signed)
Left message for patient regarding upcoming aug appt updates per 8/6

## 2018-06-06 ENCOUNTER — Ambulatory Visit: Payer: BLUE CROSS/BLUE SHIELD | Admitting: Neurology

## 2018-06-06 ENCOUNTER — Inpatient Hospital Stay: Payer: BLUE CROSS/BLUE SHIELD

## 2018-06-06 ENCOUNTER — Encounter

## 2018-06-06 ENCOUNTER — Inpatient Hospital Stay: Payer: BLUE CROSS/BLUE SHIELD | Admitting: Hematology and Oncology

## 2018-06-08 ENCOUNTER — Telehealth: Payer: Self-pay | Admitting: Hematology and Oncology

## 2018-06-08 ENCOUNTER — Inpatient Hospital Stay: Payer: BLUE CROSS/BLUE SHIELD | Attending: Hematology and Oncology | Admitting: Hematology and Oncology

## 2018-06-08 ENCOUNTER — Inpatient Hospital Stay: Payer: BLUE CROSS/BLUE SHIELD

## 2018-06-08 DIAGNOSIS — C773 Secondary and unspecified malignant neoplasm of axilla and upper limb lymph nodes: Secondary | ICD-10-CM | POA: Diagnosis not present

## 2018-06-08 DIAGNOSIS — Z79811 Long term (current) use of aromatase inhibitors: Secondary | ICD-10-CM | POA: Diagnosis not present

## 2018-06-08 DIAGNOSIS — Z79899 Other long term (current) drug therapy: Secondary | ICD-10-CM | POA: Insufficient documentation

## 2018-06-08 DIAGNOSIS — C787 Secondary malignant neoplasm of liver and intrahepatic bile duct: Secondary | ICD-10-CM | POA: Diagnosis not present

## 2018-06-08 DIAGNOSIS — F329 Major depressive disorder, single episode, unspecified: Secondary | ICD-10-CM | POA: Diagnosis not present

## 2018-06-08 DIAGNOSIS — C50412 Malignant neoplasm of upper-outer quadrant of left female breast: Secondary | ICD-10-CM | POA: Diagnosis present

## 2018-06-08 DIAGNOSIS — Z923 Personal history of irradiation: Secondary | ICD-10-CM

## 2018-06-08 DIAGNOSIS — G620 Drug-induced polyneuropathy: Secondary | ICD-10-CM | POA: Diagnosis not present

## 2018-06-08 DIAGNOSIS — Z170 Estrogen receptor positive status [ER+]: Secondary | ICD-10-CM | POA: Diagnosis not present

## 2018-06-08 DIAGNOSIS — C7951 Secondary malignant neoplasm of bone: Secondary | ICD-10-CM | POA: Insufficient documentation

## 2018-06-08 LAB — CMP (CANCER CENTER ONLY)
ALBUMIN: 3.6 g/dL (ref 3.5–5.0)
ALT: 48 U/L — ABNORMAL HIGH (ref 0–44)
ANION GAP: 11 (ref 5–15)
AST: 67 U/L — ABNORMAL HIGH (ref 15–41)
Alkaline Phosphatase: 200 U/L — ABNORMAL HIGH (ref 38–126)
BILIRUBIN TOTAL: 0.6 mg/dL (ref 0.3–1.2)
BUN: 36 mg/dL — ABNORMAL HIGH (ref 8–23)
CALCIUM: 10.1 mg/dL (ref 8.9–10.3)
CO2: 21 mmol/L — AB (ref 22–32)
Chloride: 109 mmol/L (ref 98–111)
Creatinine: 1.15 mg/dL — ABNORMAL HIGH (ref 0.44–1.00)
GFR, EST AFRICAN AMERICAN: 57 mL/min — AB (ref >60)
GFR, Estimated: 49 mL/min — ABNORMAL LOW (ref >60)
Glucose, Bld: 109 mg/dL — ABNORMAL HIGH (ref 70–99)
Potassium: 4.4 mmol/L (ref 3.5–5.1)
Sodium: 141 mmol/L (ref 135–145)
TOTAL PROTEIN: 7.1 g/dL (ref 6.5–8.1)

## 2018-06-08 LAB — CBC WITH DIFFERENTIAL (CANCER CENTER ONLY)
BASOS ABS: 0 10*3/uL (ref 0.0–0.1)
Basophils Relative: 1 %
Eosinophils Absolute: 0.1 10*3/uL (ref 0.0–0.5)
Eosinophils Relative: 3 %
HEMATOCRIT: 31.3 % — AB (ref 34.8–46.6)
HEMOGLOBIN: 10.4 g/dL — AB (ref 11.6–15.9)
Lymphocytes Relative: 52 %
Lymphs Abs: 1.3 10*3/uL (ref 0.9–3.3)
MCH: 33.5 pg (ref 25.1–34.0)
MCHC: 33.2 g/dL (ref 31.5–36.0)
MCV: 101 fL (ref 79.5–101.0)
Monocytes Absolute: 0.2 10*3/uL (ref 0.1–0.9)
Monocytes Relative: 9 %
NEUTROS ABS: 0.9 10*3/uL — AB (ref 1.5–6.5)
NEUTROS PCT: 35 %
Platelet Count: 235 10*3/uL (ref 145–400)
RBC: 3.1 MIL/uL — AB (ref 3.70–5.45)
RDW: 20.1 % — ABNORMAL HIGH (ref 11.2–14.5)
WBC: 2.5 10*3/uL — AB (ref 3.9–10.3)

## 2018-06-08 NOTE — Assessment & Plan Note (Signed)
Metastatic breast cancer in the left breast involving left axillary lymph nodes and isolated L1 vertebral metastases stage IV disease, ER 100 percent, PR 4%, HER-2 negative ratio 1.3 Ki-67 15%  CTCAP4/17/2019: Extensive diffuse hepatic metastases, secondary hepatomegaly, bone metastases in the thoracic spine, nonspecific pulmonary nodules, lower periesophageal lymph nodes Liver biopsy 02/24/2018: Metastatic breast cancer,ER 100%, PR 10%, HER-2 negative ratio 1.41  Current treatment:Ibrance with letrozole started 03/11/2018 Ibrance toxicities: 1.Neutropenia: Today's ANC is.  Current dosage of Ibrance: 75 mg  Major depression: Patient sees therapist.  CT CAP: 05/26/2018 interval increase in the size of innumerable low-attenuation liver metastatic lesions, interval progression of lytic bone metastases, interval increase in the lower paraesophageal adenopathy  CT head: Multiple large and small lytic lesions throughout the skull  Radiology review: I discussed the imaging studies showing progression of disease. I recommended doing foundation one as well as PI 3 kinase mutation analysis in addition to PDL 1 testing on the liver biopsy.  I recommended switching her from letrozole to Faslodex.  We will continue with Ibrance.  2 weeks follow-up for Faslodex injection  Return to clinic in 4 weeks for follow-up

## 2018-06-08 NOTE — Telephone Encounter (Signed)
Gave avs and calendar ° °

## 2018-06-08 NOTE — Progress Notes (Signed)
Patient Care Team: Ma Hillock, DO as PCP - General (Family Medicine) Nicholas Lose, MD as Consulting Physician (Hematology and Oncology) [name], MD as Consulting Physician (General Surgery) Eppie Gibson, MD as Attending Physician (Radiation Oncology) Particia Nearing, MD as Referring Physician (Gastroenterology) Delrae Rend, MD as Consulting Physician (Endocrinology) Kathrynn Ducking, MD as Consulting Physician (Neurology)  DIAGNOSIS:  Encounter Diagnosis  Name Primary?  . Malignant neoplasm of upper-outer quadrant of left breast in female, estrogen receptor positive ([place])     SUMMARY OF ONCOLOGIC HISTORY:   Breast cancer of upper-outer quadrant of left female breast ([place])   05/18/2014 Mammogram    Suspicious left upper outer quadrant 5 cm segmental area of calcifications.     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    05/30/2014 Breast MRI    Left Breast: 12oclock: 21 x 23 x 30 mm, Numerous  level 1 and 2 LN; Liver lesions cycts by Liver MRI    06/07/2014 Initial Biopsy    Left axillary lymph node biopsy invasive ductal carcinoma ER 100%, PR 11% Ki-67 15% HER-2 negative ratio 1.41    06/13/2014 PET scan    left breast activity, hypermetabolic left axillary and left retropectoral lymph node, small lucent lesion in L1 vertebra which was biopsied and proven to be bone metastases    07/19/2014 Initial Biopsy    Biopsy of L1 vertebra metastatic carcinoma breast primary, ER 100%, PR 4%, HER-2 negative ratio 1.3    07/26/2014 - 12/18/2014 Neo-Adjuvant Chemotherapy    Even though she has L1 solitary bone metastases, patient is being treated definitively with neoadjuvant chemotherapy with dose dense Adriamycin and Cytoxan to be followed by weekly Taxol x12    12/25/2014 PET scan    Interval improvement in size and metabolic activity associated with left axillary lymph nodes  consistent with treatment response, no hypermetabolic distant metastases, L1 vertebral body has no activity    12/25/2014 Breast MRI    No residual enhancement left breast, decreased left axillary lymphadenopathy, indicating response to treatment but lymph nodes are still present    01/30/2015 Surgery    Left breast lumpectomy: Invasive ductal carcinoma grade 2, 1.2 cm, 12/15 lymph nodes positive, extracapsular tumor extension present, T1c N3M1 stage IV     03/12/2015 - 04/24/2015 Radiation Therapy     adjuvant radiation therapy with Dr. Isidore Moos    05/16/2015 - 05/2016 Anti-estrogen oral therapy    Anastrozole 1 mg daily 10 years is the plan; [name] was recommended but she was not reachable and hence it was not filled, switched to tamoxifen 04/07/2016.  Was unable to tolerate Tamoxifen and stopped at some point in 2017, she isn't sure when.      01/10/2016 Imaging    CT chest abdomen pelvis: No evidence of metastatic disease    02/09/2018 Relapse/Recurrence    Extensive diffuse liver metastases 2.2 cm, 4.3 cm.  Hepatomegaly, bone metastases most apparent in thoracic spine, nonspecific lung nodules lower periesophageal lymph nodes suspicious    02/25/2018 Pathology Results    Liver biopsy: Metastatic breast cancer ER 100%, PR 10%, HER-2 negative ratio 1.41     03/10/2018 -  Anti-estrogen oral therapy    Ibrance with letrozole     CHIEF COMPLIANT: Follow-up to review the scans, patient has not taken Ibrance for couple of weeks because of not feeling well  INTERVAL HISTORY: Kimberly Reed is a very complex  62 year old lady with above-mentioned some metastatic breast cancer is currently on Ibrance with letrozole.  She tells me that she did not take Ibrance for a couple of weeks because she did not feel well.  She has then started taking it and this is her off week.  She had scans earlier in August which showed evidence of progression of disease but it is unclear how much [place] she had actually taken  up to that point.  At 75 mg dosage she appears to be tolerating it fairly well. She has multiple emotional psychological problems for which she is seeing a therapist.  She is also receiving help from [facility].  She recently had cataract surgery and did very well.  REVIEW OF SYSTEMS:   Constitutional: Denies fevers, chills or abnormal weight loss Eyes: Denies blurriness of vision Ears, nose, mouth, throat, and face: Denies mucositis or sore throat Respiratory: Denies cough, dyspnea or wheezes Cardiovascular: Denies palpitation, chest discomfort Gastrointestinal:  Denies nausea, heartburn or change in bowel habits Skin: Denies abnormal skin rashes Lymphatics: Denies new lymphadenopathy or easy bruising Neurological:Denies numbness, tingling or new weaknesses Behavioral/Psych: Depression anxiety Extremities: No lower extremity edema All other systems were reviewed with the patient and are negative.  I have reviewed the past medical history, past surgical history, social history and family history with the patient and they are unchanged from previous note.  ALLERGIES:  is allergic to other; effexor [venlafaxine]; and mucinex [guaifenesin er].  MEDICATIONS:  Current Outpatient Medications  Medication Sig Dispense Refill  . acetaminophen (TYLENOL) 325 MG tablet Take 2 tablets (650 mg total) by mouth every 6 (six) hours as needed for mild pain or moderate pain (or Fever >/= 101). 30 tablet 0  . ALPRAZolam (XANAX) 0.5 MG tablet TAKE 1 TABLET BY MOUTH AT BEDTIME AS NEEDED FOR ANXIETY 30 tablet 0  . brimonidine-timolol (COMBIGAN) 0.2-0.5 % ophthalmic solution Place 1 drop into both eyes every 12 (twelve) hours.     [name] dexamethasone (DECADRON) 4 MG tablet Take 1 tablet (4 mg total) by mouth 2 (two) times daily. 20 tablet 0  . dorzolamide (TRUSOPT) 2 % ophthalmic solution Place 1 drop into both eyes 2 (two) times daily.     [name] letrozole (FEMARA) 2.5 MG tablet Take 1 tablet (2.5 mg total) by  mouth daily. 90 tablet 3  . levothyroxine (SYNTHROID, LEVOTHROID) 150 MCG tablet TAKE 1 TABLET BY MOUTH EVERY DAY ON EMPTY STOMACH IN THE MORNING  0  . Omega-3 Fatty Acids (FISH OIL) 1000 MG CAPS Take 1,000 mg by mouth daily.    . ondansetron (ZOFRAN-ODT) 8 MG disintegrating tablet Take 1 tablet (8 mg total) by mouth every 8 (eight) hours as needed for nausea or vomiting. 30 tablet 3  . palbociclib (IBRANCE) 75 MG capsule Take 1 capsule (75 mg total) by mouth daily with breakfast. Take whole with food. Take for 21 days on, 7 days off, repeat every 28 days. 21 capsule 3  . prochlorperazine (COMPAZINE) 10 MG tablet Take 1 tablet (10 mg total) by mouth every 6 (six) hours as needed for nausea or vomiting. 30 tablet 0  . Travoprost, BAK Free, (TRAVATAN) 0.004 % SOLN ophthalmic solution Place 1 drop into both eyes at bedtime.      No current facility-administered medications for this visit.     PHYSICAL EXAMINATION: ECOG PERFORMANCE STATUS: 2 - Symptomatic, <50% confined to bed  Vitals:   06/08/18 1103  BP: (!) 143/77  Pulse: 73  Resp: 18  Temp:  98.6 F (37 C)  SpO2: 97%   Filed Weights   06/08/18 1103  Weight: 234 lb 8 oz (106.4 kg)    GENERAL:alert, no distress and comfortable SKIN: skin color, texture, turgor are normal, no rashes or significant lesions EYES: normal, Conjunctiva are pink and non-injected, sclera clear OROPHARYNX:no exudate, no erythema and lips, buccal mucosa, and tongue normal  NECK: supple, thyroid normal size, non-tender, without nodularity LYMPH:  no palpable lymphadenopathy in the cervical, axillary or inguinal LUNGS: clear to auscultation and percussion with normal breathing effort HEART: regular rate & rhythm and no murmurs and no lower extremity edema ABDOMEN:abdomen soft, non-tender and normal bowel sounds MUSCULOSKELETAL:no cyanosis of digits and no clubbing  NEURO: alert & oriented x 3 with fluent speech, no focal motor/sensory deficits EXTREMITIES: No  lower extremity edema   LABORATORY DATA:  I have reviewed the data as listed CMP Latest Ref Rng & Units 06/08/2018 05/10/2018 04/29/2018  Glucose 70 - 99 mg/dL 109(H) 103(H) 113(H)  BUN 8 - 23 mg/dL 36(H) 21 31(H)  Creatinine 0.44 - 1.00 mg/dL 1.15(H) 1.09(H) 1.34(H)  Sodium 135 - 145 mmol/L 141 136 138  Potassium 3.5 - 5.1 mmol/L 4.4 4.1 4.6  Chloride 98 - 111 mmol/L 109 102 104  CO2 22 - 32 mmol/L 21(L) 25 26  Calcium 8.9 - 10.3 mg/dL 10.1 10.7(H) 10.4(H)  Total Protein 6.5 - 8.1 g/dL 7.1 7.8 7.3  Total Bilirubin 0.3 - 1.2 mg/dL 0.6 0.6 0.5  Alkaline Phos 38 - 126 U/L 200(H) 200(H) 232(H)  AST 15 - 41 U/L 67(H) 65(H) 66(H)  ALT 0 - 44 U/L 48(H) 33 61(H)    Lab Results  Component Value Date   WBC 2.5 (L) 06/08/2018   HGB 10.4 (L) 06/08/2018   HCT 31.3 (L) 06/08/2018   MCV 101.0 06/08/2018   PLT 235 06/08/2018   NEUTROABS 0.9 (L) 06/08/2018    ASSESSMENT & PLAN:  Breast cancer of upper-outer quadrant of left female breast ([place]) Metastatic breast cancer in the left breast involving left axillary lymph nodes and isolated L1 vertebral metastases stage IV disease, ER 100 percent, PR 4%, HER-2 negative ratio 1.3 Ki-67 15%  CTCAP4/17/2019: Extensive diffuse hepatic metastases, secondary hepatomegaly, bone metastases in the thoracic spine, nonspecific pulmonary nodules, lower periesophageal lymph nodes Liver biopsy 02/24/2018: Metastatic breast cancer,ER 100%, PR 10%, HER-2 negative ratio 1.41  Current treatment:Ibrance with letrozole started 03/11/2018 Ibrance toxicities: 1.Neutropenia: Today's ANC is.  0.9  Current dosage of Ibrance: 75 mg I recommended continuation of the current dosing.  Major depression: Patient sees therapist.  CT CAP: 05/26/2018 interval increase in the size of innumerable low-attenuation liver metastatic lesions, interval progression of lytic bone metastases, interval increase in the lower paraesophageal adenopathy  CT head: Multiple large and small lytic  lesions throughout the skull  Radiology review: I did not discussed the radiology images with the patient in extensive detail primarily because it is unclear how much relevance we can put to these results.  I just told her that the breast cancer progression of disease in the liver and the bone.  I recommended doing foundation one as well as PI 3 kinase mutation analysis in addition to PDL 1 testing on the liver biopsy.  Return to clinic in 4 weeks for follow-up    Orders Placed This Encounter  Procedures  . CBC with Differential (Cancer Center Only)    Standing Status:   Future    Standing Expiration Date:   06/09/2019  . CMP (  [place] only)    Standing Status:   Future    Standing Expiration Date:   06/09/2019   The patient has a good understanding of the overall plan. she agrees with it. she will call with any problems that may develop before the next visit here.   Harriette Ohara, MD 06/08/18

## 2018-06-13 ENCOUNTER — Encounter: Payer: Self-pay | Admitting: General Practice

## 2018-06-13 NOTE — Progress Notes (Signed)
[place] Spiritual Care Note  Met with Kimberly Reed as scheduled. She verbalized a significant insight today: part of the reason she's "so focused on the news" and "finding more and more negative stories--I just seem to seek them out" is because seeing the world as "worth leaving" and "not going to make it much longer" makes it easier for her to face her own morality. If the world has a limited life expectancy, then that makes it less hard for her to have a limited life expectancy too--there's less to be leaving behind.  Provided empathic listening, pastoral reflection, naming of strengths, and assistance with brainstorming questions to ask at her upcoming post-op eye appt (re cataracts, glaucoma, new expected vision baseline, and anticipated improvement from new rx) because anticipating a rushed appt is an additional stressor. Also helped her think about breaking down her big task of home organization/purging into smaller tasks at which she can find success.  We plan to meet next Monday for further conversation about mortality and living for the present.   [place], [place], [facility] Pager [phone] Voicemail [phone]

## 2018-06-15 ENCOUNTER — Other Ambulatory Visit: Payer: BLUE CROSS/BLUE SHIELD

## 2018-06-15 ENCOUNTER — Ambulatory Visit: Payer: BLUE CROSS/BLUE SHIELD | Admitting: Hematology and Oncology

## 2018-06-16 ENCOUNTER — Telehealth: Payer: Self-pay

## 2018-06-16 NOTE — Telephone Encounter (Signed)
Oral Oncology Patient Advocate Encounter  Ms. Timm called concerned about the cost of the medication since her insurance will be terminated due to qitting her job.  I spoke to the patient and she isn't sure at the moment what she is doing about her insurance. She may pay the rest of the year to keep her current insurance. Today (8/16) she is going to the [facility] office to get assistance dealing with her disability. I gave her SHIPP information to answer her medicare questions. I advised her to let me know what she figures out with the insurance and I will help her how I can to make sure she is able to get her Ibrance.   She verbalized understanding and appreciation.  [place] [facility] [place] Phone [phone] Fax [phone]

## 2018-06-20 ENCOUNTER — Encounter: Payer: Self-pay | Admitting: General Practice

## 2018-06-20 NOTE — Progress Notes (Signed)
[place] Spiritual Care Note  Today Mischele was more upbeat and energetic than I have perhaps ever seen her. She is very motivated to declutter her apartment, which seems to be a symbol of/proxy for decluttering her inward life. She is currently focusing on building QOL for herself with the time and energy she does have, which is helping her feel empowered about living instead of depressed about dying.  We also talked about her theological/metaphysical ideas about death and what follows, a conversation that was also empowering for her because she got in touch with a sense of journey and continuity, rather than being stuck with "death-as-annihilation" image.  We bookmarked an appt time for 1pm on Monday, Sept 16, with the understanding that she may call to cancel or simply to share an update if she wants instead to continue harnessing her energy for change at home.      [place], [place], [facility] Pager [phone] Voicemail [phone]

## 2018-06-22 ENCOUNTER — Telehealth: Payer: Self-pay

## 2018-06-22 NOTE — Telephone Encounter (Signed)
Patient left voicemail inquiring about prescription needed to be sent to [facility] for supplies regarding her left arm lymphedema.  Nurse contacted [facility] at [phone], they will fax over information for MD to complete.  Left message for patient informing her of this information.

## 2018-06-23 ENCOUNTER — Telehealth: Payer: Self-pay | Admitting: General Practice

## 2018-06-23 NOTE — Telephone Encounter (Signed)
Oral Oncology Patient Advocate Encounter  Kimberly Reed called today to inform me that she is keeping her BCBS that she had with her job. She has met her out of pocket for the year so her Ibrance copay is $0 right now. When the year starts over we will get her a copay card to cover the high copay again.     [facility] Patient Advocate [facility] Phone [phone] Fax [phone]   

## 2018-06-23 NOTE — Telephone Encounter (Signed)
[place] CSW Progress Note  Request from chaplain Lorrin Jackson to call patient to answer questions.  Called patient, left VM w contact details and encouraged patient to call back if needed.  Edwyna Shell, LCSW Clinical Social Worker Phone:  [phone]

## 2018-06-29 ENCOUNTER — Telehealth: Payer: Self-pay | Admitting: Neurology

## 2018-06-29 ENCOUNTER — Telehealth: Payer: Self-pay | Admitting: General Practice

## 2018-06-29 NOTE — Telephone Encounter (Signed)
Called the patient because [facility] sent [place] a notification that she started CPAP therapy. Called to get her scheduled since she doesn't have a follow up apt scheduled. Pt states that she is unsure if she plans to keep the machine. Advised that pt if she chooses to keep the machine we would need a follow up apt 31- 90 days of using the machine. Informed her she would need to be seen 07/29/2018-09/26/2018. This apt can be with NP or MD. Pt states that she will call us back to make this apt if she chooses to move forward.  If pt calls back was going to offer Nov 1st apt with Ward Givens, NP

## 2018-06-29 NOTE — Telephone Encounter (Signed)
[place] CSW Progress Notes  Request received from chaplain to talk w patient re disability application.  Has applied for Social Security Administration disability, states that she has submitted paperwork at the [facility] office, has called to check on status of application and is concerned about "how long it is taking."  Advised patient that keeping in touch w the [facility] office is the only way to verify status of application and to ensure that all necessary paperwork is in order.  CSW is unable to assist w this process as patients must initiate calls to [facility] directly.  Edwyna Shell, LCSW Clinical Social Worker Phone:  [phone]

## 2018-07-06 ENCOUNTER — Encounter: Payer: Self-pay | Admitting: General Practice

## 2018-07-06 ENCOUNTER — Inpatient Hospital Stay: Payer: BLUE CROSS/BLUE SHIELD | Attending: Hematology and Oncology

## 2018-07-06 ENCOUNTER — Inpatient Hospital Stay: Payer: BLUE CROSS/BLUE SHIELD

## 2018-07-06 ENCOUNTER — Telehealth: Payer: Self-pay

## 2018-07-06 ENCOUNTER — Inpatient Hospital Stay (HOSPITAL_BASED_OUTPATIENT_CLINIC_OR_DEPARTMENT_OTHER): Payer: BLUE CROSS/BLUE SHIELD | Admitting: Hematology and Oncology

## 2018-07-06 VITALS — BP 115/58 | HR 58 | Resp 16

## 2018-07-06 DIAGNOSIS — Z79899 Other long term (current) drug therapy: Secondary | ICD-10-CM

## 2018-07-06 DIAGNOSIS — C787 Secondary malignant neoplasm of liver and intrahepatic bile duct: Secondary | ICD-10-CM | POA: Diagnosis not present

## 2018-07-06 DIAGNOSIS — C50412 Malignant neoplasm of upper-outer quadrant of left female breast: Secondary | ICD-10-CM | POA: Insufficient documentation

## 2018-07-06 DIAGNOSIS — Z170 Estrogen receptor positive status [ER+]: Secondary | ICD-10-CM | POA: Diagnosis not present

## 2018-07-06 DIAGNOSIS — F329 Major depressive disorder, single episode, unspecified: Secondary | ICD-10-CM | POA: Diagnosis not present

## 2018-07-06 DIAGNOSIS — E860 Dehydration: Secondary | ICD-10-CM

## 2018-07-06 DIAGNOSIS — C773 Secondary and unspecified malignant neoplasm of axilla and upper limb lymph nodes: Secondary | ICD-10-CM

## 2018-07-06 DIAGNOSIS — C7951 Secondary malignant neoplasm of bone: Secondary | ICD-10-CM | POA: Diagnosis not present

## 2018-07-06 LAB — CMP (CANCER CENTER ONLY)
ALBUMIN: 3.3 g/dL — AB (ref 3.5–5.0)
ALT: 32 U/L (ref 0–44)
ANION GAP: 8 (ref 5–15)
AST: 61 U/L — ABNORMAL HIGH (ref 15–41)
Alkaline Phosphatase: 203 U/L — ABNORMAL HIGH (ref 38–126)
BILIRUBIN TOTAL: 0.8 mg/dL (ref 0.3–1.2)
BUN: 27 mg/dL — AB (ref 8–23)
CO2: 24 mmol/L (ref 22–32)
Calcium: 10.1 mg/dL (ref 8.9–10.3)
Chloride: 108 mmol/L (ref 98–111)
Creatinine: 1.36 mg/dL — ABNORMAL HIGH (ref 0.44–1.00)
GFR, Est AFR Am: 47 mL/min — ABNORMAL LOW (ref >60)
GFR, Estimated: 40 mL/min — ABNORMAL LOW (ref >60)
GLUCOSE: 110 mg/dL — AB (ref 70–99)
POTASSIUM: 4.3 mmol/L (ref 3.5–5.1)
SODIUM: 140 mmol/L (ref 135–145)
TOTAL PROTEIN: 6.9 g/dL (ref 6.5–8.1)

## 2018-07-06 LAB — CBC WITH DIFFERENTIAL (CANCER CENTER ONLY)
BASOS ABS: 0.1 10*3/uL (ref 0.0–0.1)
Basophils Relative: 2 %
Eosinophils Absolute: 0.3 10*3/uL (ref 0.0–0.5)
Eosinophils Relative: 11 %
HEMATOCRIT: 27.8 % — AB (ref 34.8–46.6)
HEMOGLOBIN: 9.2 g/dL — AB (ref 11.6–15.9)
LYMPHS PCT: 42 %
Lymphs Abs: 1.1 10*3/uL (ref 0.9–3.3)
MCH: 35.2 pg — ABNORMAL HIGH (ref 25.1–34.0)
MCHC: 33.2 g/dL (ref 31.5–36.0)
MCV: 106.1 fL — ABNORMAL HIGH (ref 79.5–101.0)
MONO ABS: 0.2 10*3/uL (ref 0.1–0.9)
Monocytes Relative: 7 %
NEUTROS ABS: 1 10*3/uL — AB (ref 1.5–6.5)
NEUTROS PCT: 38 %
Platelet Count: 220 10*3/uL (ref 145–400)
RBC: 2.62 MIL/uL — AB (ref 3.70–5.45)
RDW: 17.5 % — AB (ref 11.2–14.5)
WBC: 2.7 10*3/uL — AB (ref 3.9–10.3)

## 2018-07-06 MED ORDER — SODIUM CHLORIDE 0.9 % IV SOLN
Freq: Once | INTRAVENOUS | Status: AC
  Administered 2018-07-06: 13:00:00 via INTRAVENOUS
  Filled 2018-07-06: qty 250

## 2018-07-06 NOTE — Progress Notes (Signed)
Per Dr.Gudena, due pt hypotension and dizziness, pt to receive 1L normal saline bolus today, over 1 hr. Sent stat message to scheduling, to add pt in infusion schedule, with approval of infusion charge RN

## 2018-07-06 NOTE — Telephone Encounter (Signed)
Printed avs and calender of upcoming appointment. Per 9/11 los 

## 2018-07-06 NOTE — Progress Notes (Signed)
Patient Care Team: Kimberly Hillock, DO as PCP - General (Family Medicine) Kimberly Lose, MD as Consulting Physician (Hematology and Oncology) Kimberly Seltzer, MD as Consulting Physician (General Surgery) Kimberly Gibson, MD as Attending Physician (Radiation Oncology) Kimberly Nearing, MD as Referring Physician (Gastroenterology) Kimberly Rend, MD as Consulting Physician (Endocrinology) Kimberly Ducking, MD as Consulting Physician (Neurology)  DIAGNOSIS:  Encounter Diagnosis  Name Primary?  . Malignant neoplasm of upper-outer quadrant of left breast in female, estrogen receptor positive (Kimberly Reed)     SUMMARY OF ONCOLOGIC HISTORY:   Breast cancer of upper-outer quadrant of left female breast ([place])   05/18/2014 Mammogram    Suspicious left upper outer quadrant 5 cm segmental area of calcifications.     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    05/30/2014 Breast MRI    Left Breast: 12oclock: 21 x 23 x 30 mm, Numerous  level 1 and 2 LN; Liver lesions cycts by Liver MRI    06/07/2014 Initial Biopsy    Left axillary lymph node biopsy invasive ductal carcinoma ER 100%, PR 11% Ki-67 15% HER-2 negative ratio 1.41    06/13/2014 PET scan    left breast activity, hypermetabolic left axillary and left retropectoral lymph node, small lucent lesion in L1 vertebra which was biopsied and proven to be bone metastases    07/19/2014 Initial Biopsy    Biopsy of L1 vertebra metastatic carcinoma breast primary, ER 100%, PR 4%, HER-2 negative ratio 1.3    07/26/2014 - 12/18/2014 Neo-Adjuvant Chemotherapy    Even though she has L1 solitary bone metastases, patient is being treated definitively with neoadjuvant chemotherapy with dose dense Adriamycin and Cytoxan to be followed by weekly Taxol x12    12/25/2014 PET scan    Interval improvement in size and metabolic activity associated with left axillary lymph nodes  consistent with treatment response, no hypermetabolic distant metastases, L1 vertebral body has no activity    12/25/2014 Breast MRI    No residual enhancement left breast, decreased left axillary lymphadenopathy, indicating response to treatment but lymph nodes are still present    01/30/2015 Surgery    Left breast lumpectomy: Invasive ductal carcinoma grade 2, 1.2 cm, 12/15 lymph nodes positive, extracapsular tumor extension present, T1c N3M1 stage IV     03/12/2015 - 04/24/2015 Radiation Therapy     adjuvant radiation therapy with Dr. Isidore Reed    05/16/2015 - 05/2016 Anti-estrogen oral therapy    Anastrozole 1 mg daily 10 years is the plan; Kimberly Reed was recommended but she was not reachable and hence it was not filled, switched to tamoxifen 04/07/2016.  Was unable to tolerate Tamoxifen and stopped at some point in 2017, she isn't sure when.      01/10/2016 Imaging    CT chest abdomen pelvis: No evidence of metastatic disease    02/09/2018 Relapse/Recurrence    Extensive diffuse liver metastases 2.2 cm, 4.3 cm.  Hepatomegaly, bone metastases most apparent in thoracic spine, nonspecific lung nodules lower periesophageal lymph nodes suspicious    02/25/2018 Pathology Results    Liver biopsy: Metastatic breast cancer ER 100%, PR 10%, HER-2 negative ratio 1.41     03/10/2018 -  Anti-estrogen oral therapy    Ibrance with letrozole     CHIEF COMPLIANT: Follow-up on Ibrance with letrozole, dehydration  INTERVAL HISTORY: Kimberly Reed is a 62 year old with above-mentioned history of metastatic breast cancer who is currently on Ibrance with letrozole,  she is here for a monthly follow-up.  She reports that she has been feeling dizzy.  She has not been drinking enough water.  She however feels pretty decent.  Denies any fevers or chills.  She is taking [place] religiously.  She is currently off Ibrance this week.  Her energy levels seem to wax and wane.  She is overall doing much better than  previously.  REVIEW OF SYSTEMS:   Constitutional: Denies fevers, chills or abnormal weight loss Eyes: Denies blurriness of vision Ears, nose, mouth, throat, and face: Denies mucositis or sore throat Respiratory: Denies cough, dyspnea or wheezes Cardiovascular: Denies palpitation, chest discomfort Gastrointestinal:  Denies nausea, heartburn or change in bowel habits Skin: Denies abnormal skin rashes Lymphatics: Denies new lymphadenopathy or easy bruising Neurological: Dizziness Behavioral/Psych: Emotional problems Extremities: No lower extremity edema   All other systems were reviewed with the patient and are negative.  I have reviewed the past medical history, past surgical history, social history and family history with the patient and they are unchanged from previous note.  ALLERGIES:  is allergic to other; effexor [venlafaxine]; and mucinex [guaifenesin er].  MEDICATIONS:  Current Outpatient Medications  Medication Sig Dispense Refill  . acetaminophen (TYLENOL) 325 MG tablet Take 2 tablets (650 mg total) by mouth every 6 (six) hours as needed for mild pain or moderate pain (or Fever >/= 101). 30 tablet 0  . ALPRAZolam (XANAX) 0.5 MG tablet TAKE 1 TABLET BY MOUTH AT BEDTIME AS NEEDED FOR ANXIETY 30 tablet 0  . brimonidine-timolol (COMBIGAN) 0.2-0.5 % ophthalmic solution Place 1 drop into both eyes every 12 (twelve) hours.     [name] dexamethasone (DECADRON) 4 MG tablet Take 1 tablet (4 mg total) by mouth 2 (two) times daily. 20 tablet 0  . dorzolamide (TRUSOPT) 2 % ophthalmic solution Place 1 drop into both eyes 2 (two) times daily.     [name] letrozole (FEMARA) 2.5 MG tablet Take 1 tablet (2.5 mg total) by mouth daily. 90 tablet 3  . levothyroxine (SYNTHROID, LEVOTHROID) 150 MCG tablet TAKE 1 TABLET BY MOUTH EVERY DAY ON EMPTY STOMACH IN THE MORNING  0  . Omega-3 Fatty Acids (FISH OIL) 1000 MG CAPS Take 1,000 mg by mouth daily.    . ondansetron (ZOFRAN-ODT) 8 MG disintegrating tablet Take 1  tablet (8 mg total) by mouth every 8 (eight) hours as needed for nausea or vomiting. 30 tablet 3  . palbociclib (IBRANCE) 75 MG capsule Take 1 capsule (75 mg total) by mouth daily with breakfast. Take whole with food. Take for 21 days on, 7 days off, repeat every 28 days. 21 capsule 3  . prochlorperazine (COMPAZINE) 10 MG tablet Take 1 tablet (10 mg total) by mouth every 6 (six) hours as needed for nausea or vomiting. 30 tablet 0  . Travoprost, BAK Free, (TRAVATAN) 0.004 % SOLN ophthalmic solution Place 1 drop into both eyes at bedtime.      No current facility-administered medications for this visit.     PHYSICAL EXAMINATION: ECOG PERFORMANCE STATUS: 1 - Symptomatic but completely ambulatory  Vitals:   07/06/18 1111  BP: (!) 94/41  Pulse: 65  Resp: 17  Temp: 97.9 F (36.6 C)  SpO2: 100%   Filed Weights   07/06/18 1111  Weight: 234 lb (106.1 kg)    GENERAL:alert, no distress and comfortable SKIN: skin color, texture, turgor are normal, no rashes or significant lesions EYES: normal, Conjunctiva are pink and non-injected, sclera clear OROPHARYNX:no exudate, no erythema and lips, buccal  mucosa, and tongue normal  NECK: supple, thyroid normal size, non-tender, without nodularity LYMPH:  no palpable lymphadenopathy in the cervical, axillary or inguinal LUNGS: clear to auscultation and percussion with normal breathing effort HEART: regular rate & rhythm and no murmurs and no lower extremity edema ABDOMEN:abdomen soft, non-tender and normal bowel sounds MUSCULOSKELETAL:no cyanosis of digits and no clubbing  NEURO: alert & oriented x 3 with fluent speech, no focal motor/sensory deficits EXTREMITIES: No lower extremity edema   LABORATORY DATA:  I have reviewed the data as listed CMP Latest Ref Rng & Units 06/08/2018 05/10/2018 04/29/2018  Glucose 70 - 99 mg/dL 109(H) 103(H) 113(H)  BUN 8 - 23 mg/dL 36(H) 21 31(H)  Creatinine 0.44 - 1.00 mg/dL 1.15(H) 1.09(H) 1.34(H)  Sodium 135 - 145  mmol/L 141 136 138  Potassium 3.5 - 5.1 mmol/L 4.4 4.1 4.6  Chloride 98 - 111 mmol/L 109 102 104  CO2 22 - 32 mmol/L 21(L) 25 26  Calcium 8.9 - 10.3 mg/dL 10.1 10.7(H) 10.4(H)  Total Protein 6.5 - 8.1 g/dL 7.1 7.8 7.3  Total Bilirubin 0.3 - 1.2 mg/dL 0.6 0.6 0.5  Alkaline Phos 38 - 126 U/L 200(H) 200(H) 232(H)  AST 15 - 41 U/L 67(H) 65(H) 66(H)  ALT 0 - 44 U/L 48(H) 33 61(H)    Lab Results  Component Value Date   WBC 2.7 (L) 07/06/2018   HGB 9.2 (L) 07/06/2018   HCT 27.8 (L) 07/06/2018   MCV 106.1 (H) 07/06/2018   PLT 220 07/06/2018   NEUTROABS 1.0 (L) 07/06/2018    ASSESSMENT & PLAN:  Breast cancer of upper-outer quadrant of left female breast ([place]) Metastatic breast cancer in the left breast involving left axillary lymph nodes and isolated L1 vertebral metastases stage IV disease, ER 100 percent, PR 4%, HER-2 negative ratio 1.3 Ki-67 15%  CTCAP4/17/2019: Extensive diffuse hepatic metastases, secondary hepatomegaly, bone metastases in the thoracic spine, nonspecific pulmonary nodules, lower periesophageal lymph nodes Liver biopsy 02/24/2018: Metastatic breast cancer,ER 100%, PR 10%, HER-2 negative ratio 1.41  Current treatment:Ibrance with letrozole started 03/11/2018 Ibrance toxicities: 1.Neutropenia: Today's ANCis. Current dosage of Ibrance: 75 mg I recommended continuation of the current dosing. 2. Fatigue  CT CAP: 05/26/2018 interval increase in the size of innumerable low-attenuation liver metastatic lesions, interval progression of lytic bone metastases, interval increase in the lower paraesophageal adenopathy  CT head: Multiple large and small lytic lesions throughout the skull  Next scans will be performed November 2019  Major depression: Patient needs a lot of support and is seeing a therapist.  She has limited social support systems.  She has met with our chaplain as well as social workers.  Dizziness: Patient is dehydrated and will need 1 L of normal  saline today.  I gave her extensive instructions on how to drink more liquids to prevent this dizziness. Anemia: Macrocytic related to Ibrance.  Monitoring it closely. Return to clinic in 1 month for labs and follow-up   No orders of the defined types were placed in this encounter.  The patient has a good understanding of the overall plan. she agrees with it. she will call with any problems that may develop before the next visit here.   Harriette Ohara, MD 07/06/18

## 2018-07-06 NOTE — Patient Instructions (Signed)
Dehydration, Adult Dehydration is when there is not enough fluid or water in your body. This happens when you lose more fluids than you take in. Dehydration can range from mild to very bad. It should be treated right away to keep it from getting very bad. Symptoms of mild dehydration may include:  Thirst.  Dry lips.  Slightly dry mouth.  Dry, warm skin.  Dizziness. Symptoms of moderate dehydration may include:  Very dry mouth.  Muscle cramps.  Dark pee (urine). Pee may be the color of tea.  Your body making less pee.  Your eyes making fewer tears.  Heartbeat that is uneven or faster than normal (palpitations).  Headache.  Light-headedness, especially when you stand up from sitting.  Fainting (syncope). Symptoms of very bad dehydration may include:  Changes in skin, such as: ? Cold and clammy skin. ? Blotchy (mottled) or pale skin. ? Skin that does not quickly return to normal after being lightly pinched and let go (poor skin turgor).  Changes in body fluids, such as: ? Feeling very thirsty. ? Your eyes making fewer tears. ? Not sweating when body temperature is high, such as in hot weather. ? Your body making very little pee.  Changes in vital signs, such as: ? Weak pulse. ? Pulse that is more than 100 beats a minute when you are sitting still. ? Fast breathing. ? Low blood pressure.  Other changes, such as: ? Sunken eyes. ? Cold hands and feet. ? Confusion. ? Lack of energy (lethargy). ? Trouble waking up from sleep. ? Short-term weight loss. ? Unconsciousness. Follow these instructions at home:  If told by your doctor, drink an ORS: ? Make an ORS by using instructions on the package. ? Start by drinking small amounts, about  cup (120 mL) every 5-10 minutes. ? Slowly drink more until you have had the amount that your doctor said to have.  Drink enough clear fluid to keep your pee clear or pale yellow. If you were told to drink an ORS, finish the ORS  first, then start slowly drinking clear fluids. Drink fluids such as: ? Water. Do not drink only water by itself. Doing that can make the salt (sodium) level in your body get too low (hyponatremia). ? Ice chips. ? Fruit juice that you have added water to (diluted). ? Low-calorie sports drinks.  Avoid: ? Alcohol. ? Drinks that have a lot of sugar. These include high-calorie sports drinks, fruit juice that does not have water added, and soda. ? Caffeine. ? Foods that are greasy or have a lot of fat or sugar.  Take over-the-counter and prescription medicines only as told by your doctor.  Do not take salt tablets. Doing that can make the salt level in your body get too high (hypernatremia).  Eat foods that have minerals (electrolytes). Examples include bananas, oranges, potatoes, tomatoes, and spinach.  Keep all follow-up visits as told by your doctor. This is important. Contact a doctor if:  You have belly (abdominal) pain that: ? Gets worse. ? Stays in one area (localizes).  You have a rash.  You have a stiff neck.  You get angry or annoyed more easily than normal (irritability).  You are more sleepy than normal.  You have a harder time waking up than normal.  You feel: ? Weak. ? Dizzy. ? Very thirsty.  You have peed (urinated) only a small amount of very dark pee during 6-8 hours. Get help right away if:  You have symptoms of   very bad dehydration.  You cannot drink fluids without throwing up (vomiting).  Your symptoms get worse with treatment.  You have a fever.  You have a very bad headache.  You are throwing up or having watery poop (diarrhea) and it: ? Gets worse. ? Does not go away.  You have blood or something green (bile) in your throw-up.  You have blood in your poop (stool). This may cause poop to look black and tarry.  You have not peed in 6-8 hours.  You pass out (faint).  Your heart rate when you are sitting still is more than 100 beats a  minute.  You have trouble breathing. This information is not intended to replace advice given to you by your health care provider. Make sure you discuss any questions you have with your health care provider. Document Released: 08/08/2009 Document Revised: 05/01/2016 Document Reviewed: 12/06/2015 Elsevier Interactive Patient Education  2018 [employer] Inc[employer]  

## 2018-07-06 NOTE — Assessment & Plan Note (Addendum)
Metastatic breast cancer in the left breast involving left axillary lymph nodes and isolated L1 vertebral metastases stage IV disease, ER 100 percent, PR 4%, HER-2 negative ratio 1.3 Ki-67 15%  CTCAP4/17/2019: Extensive diffuse hepatic metastases, secondary hepatomegaly, bone metastases in the thoracic spine, nonspecific pulmonary nodules, lower periesophageal lymph nodes Liver biopsy 02/24/2018: Metastatic breast cancer,ER 100%, PR 10%, HER-2 negative ratio 1.41  Current treatment:Ibrance with letrozole started 03/11/2018 Ibrance toxicities: 1.Neutropenia: Today's ANCis. Current dosage of Ibrance: 75 mg I recommended continuation of the current dosing. 2. Fatigue  CT CAP: 05/26/2018 interval increase in the size of innumerable low-attenuation liver metastatic lesions, interval progression of lytic bone metastases, interval increase in the lower paraesophageal adenopathy  CT head: Multiple large and small lytic lesions throughout the skull  Next scans will be performed November 2019  Major depression: Patient needs a lot of support and is seeing a therapist.  She has limited social support systems.  She has met with our chaplain as well as social workers.

## 2018-07-07 NOTE — Progress Notes (Signed)
[place] Spiritual Care Note  Referred by May Armel/RN for emotional support, as Kimberly Reed appeared more down, fatigued, and disengaged today. During our encounter, Maymie was able to focus on naming her goals and found satisfaction and pleasure in sharing about how she is actively working to meet them. Receiving encouragement and affirmation compounds her sense of success and empowerment to continue--her whole face and affect brighten at hearing her successes reflected back to her, an experience that, per pt, was scarce in her family of origin.  Continuing to follow for spiritual and emotional support. Saarah plans to let me know if she wants to meet on Monday at 1:00 or use that time instead to push forward with her decluttering projects at home.   [place], [place], [facility] Pager [phone] Voicemail [phone]

## 2018-07-11 ENCOUNTER — Encounter: Payer: Self-pay | Admitting: General Practice

## 2018-07-11 ENCOUNTER — Telehealth: Payer: Self-pay | Admitting: *Deleted

## 2018-07-11 NOTE — Progress Notes (Signed)
[place] Spiritual Care Note  Met with Kimberly Reed for ca 1h in my office as planned. She was in good (and somewhat boggled!) spirits after learning that she appears to have more savings than she thought. This discovery has galvanized her decluttering efforts and opened the door to more creative thinking about her priorities for living well with the time she has. She plans to take steps slowly in response, including addressing her questions and hopes with her counselor on Friday. Kimberly Reed plans to contact me for updates and debriefing/processing sometime thereafter.   [place], [place], [facility] Pager [phone] Voicemail [phone]

## 2018-07-11 NOTE — Telephone Encounter (Signed)
Call received from [name] in reference to "Missed call.  Could you tell me who called?"    No calls, documentation or notes at this time.  "Never mind, I think I'm to see Lattie Haw." No call transfer; call ended by patient.

## 2018-07-18 ENCOUNTER — Encounter: Payer: Self-pay | Admitting: General Practice

## 2018-07-18 NOTE — Progress Notes (Signed)
[place] Note  Received 73m call from [place] today re grieving the death of her close former coworker and processing the shift in identity and approach to life that she is experiencing due to larger retirement account than she expected. Her friend died of cancer, which stirs up Kimberly Reed's own vulnerability, mortality, and sense of limited time; this in turn drives Kimberly Reed's desire to utilize her resources to live well--which is also a big mind-shift from the modest way she is accustomed to living.  Provided empathic listening, pastoral reflection, emotional/bereavement support, and normalization of feelings. Kimberly Reed knows to contact chaplain whenever desired and also looks forward to reflecting on these themes with her counselor tomorrow.   [place], [place], [facility] Pager [phone] Voicemail [phone]

## 2018-07-21 ENCOUNTER — Telehealth: Payer: Self-pay

## 2018-07-21 NOTE — Telephone Encounter (Signed)
Oral Oncology Patient Advocate Encounter  I received a staff message that patient states she will no longer have insurance and needs help getting her Ibrance. I called her and left a message 07/20/18. I called back today after she called the office again 07/21/18 and spoke with her. She will continue to have insurance but it may switch to different coverage. She will wait for her next fill and let me know what [facility] says. If they can fill it that's great, if they cannot we will get involved and help her get her Ibrance.  [place] Patient [place] Phone [phone] Fax [phone]

## 2018-08-03 ENCOUNTER — Inpatient Hospital Stay (HOSPITAL_BASED_OUTPATIENT_CLINIC_OR_DEPARTMENT_OTHER): Payer: Medicare Other | Admitting: Hematology and Oncology

## 2018-08-03 ENCOUNTER — Other Ambulatory Visit: Payer: Self-pay

## 2018-08-03 ENCOUNTER — Telehealth: Payer: Self-pay | Admitting: Hematology and Oncology

## 2018-08-03 ENCOUNTER — Inpatient Hospital Stay: Payer: Medicare Other | Attending: Hematology and Oncology

## 2018-08-03 DIAGNOSIS — Z79899 Other long term (current) drug therapy: Secondary | ICD-10-CM | POA: Insufficient documentation

## 2018-08-03 DIAGNOSIS — C50412 Malignant neoplasm of upper-outer quadrant of left female breast: Secondary | ICD-10-CM

## 2018-08-03 DIAGNOSIS — Z79811 Long term (current) use of aromatase inhibitors: Secondary | ICD-10-CM

## 2018-08-03 DIAGNOSIS — Z170 Estrogen receptor positive status [ER+]: Secondary | ICD-10-CM | POA: Insufficient documentation

## 2018-08-03 DIAGNOSIS — C787 Secondary malignant neoplasm of liver and intrahepatic bile duct: Secondary | ICD-10-CM

## 2018-08-03 DIAGNOSIS — D709 Neutropenia, unspecified: Secondary | ICD-10-CM

## 2018-08-03 DIAGNOSIS — C7951 Secondary malignant neoplasm of bone: Secondary | ICD-10-CM

## 2018-08-03 DIAGNOSIS — C773 Secondary and unspecified malignant neoplasm of axilla and upper limb lymph nodes: Secondary | ICD-10-CM

## 2018-08-03 DIAGNOSIS — D539 Nutritional anemia, unspecified: Secondary | ICD-10-CM

## 2018-08-03 DIAGNOSIS — F329 Major depressive disorder, single episode, unspecified: Secondary | ICD-10-CM | POA: Insufficient documentation

## 2018-08-03 LAB — CBC WITH DIFFERENTIAL (CANCER CENTER ONLY)
Abs Immature Granulocytes: 0.01 10*3/uL (ref 0.00–0.07)
BASOS PCT: 2 %
Basophils Absolute: 0 10*3/uL (ref 0.0–0.1)
EOS ABS: 0.1 10*3/uL (ref 0.0–0.5)
EOS PCT: 4 %
HEMATOCRIT: 28.2 % — AB (ref 36.0–46.0)
Hemoglobin: 9.5 g/dL — ABNORMAL LOW (ref 12.0–15.0)
IMMATURE GRANULOCYTES: 0 %
LYMPHS ABS: 1.2 10*3/uL (ref 0.7–4.0)
Lymphocytes Relative: 45 %
MCH: 35.3 pg — AB (ref 26.0–34.0)
MCHC: 33.7 g/dL (ref 30.0–36.0)
MCV: 104.8 fL — AB (ref 80.0–100.0)
MONOS PCT: 10 %
Monocytes Absolute: 0.3 10*3/uL (ref 0.1–1.0)
NEUTROS PCT: 39 %
Neutro Abs: 1 10*3/uL — ABNORMAL LOW (ref 1.7–7.7)
Platelet Count: 230 10*3/uL (ref 150–400)
RBC: 2.69 MIL/uL — ABNORMAL LOW (ref 3.87–5.11)
RDW: 16.8 % — AB (ref 11.5–15.5)
WBC Count: 2.6 10*3/uL — ABNORMAL LOW (ref 4.0–10.5)
nRBC: 0 % (ref 0.0–0.2)

## 2018-08-03 LAB — CMP (CANCER CENTER ONLY)
ALK PHOS: 253 U/L — AB (ref 38–126)
ALT: 95 U/L — AB (ref 0–44)
ANION GAP: 8 (ref 5–15)
AST: 155 U/L — ABNORMAL HIGH (ref 15–41)
Albumin: 3.4 g/dL — ABNORMAL LOW (ref 3.5–5.0)
BILIRUBIN TOTAL: 0.9 mg/dL (ref 0.3–1.2)
BUN: 18 mg/dL (ref 8–23)
CALCIUM: 10.6 mg/dL — AB (ref 8.9–10.3)
CO2: 24 mmol/L (ref 22–32)
CREATININE: 1.19 mg/dL — AB (ref 0.44–1.00)
Chloride: 107 mmol/L (ref 98–111)
GFR, Est AFR Am: 55 mL/min — ABNORMAL LOW (ref >60)
GFR, Estimated: 47 mL/min — ABNORMAL LOW (ref >60)
GLUCOSE: 114 mg/dL — AB (ref 70–99)
Potassium: 4.1 mmol/L (ref 3.5–5.1)
Sodium: 139 mmol/L (ref 135–145)
TOTAL PROTEIN: 7.3 g/dL (ref 6.5–8.1)

## 2018-08-03 NOTE — Progress Notes (Signed)
Patient Care Team: Ma Hillock, DO as PCP - General (Family Medicine) Nicholas Lose, MD as Consulting Physician (Hematology and Oncology) [name], MD as Consulting Physician (General Surgery) Eppie Gibson, MD as Attending Physician (Radiation Oncology) Particia Nearing, MD as Referring Physician (Gastroenterology) Delrae Rend, MD as Consulting Physician (Endocrinology) Kathrynn Ducking, MD as Consulting Physician (Neurology)  DIAGNOSIS:  Encounter Diagnosis  Name Primary?  . Malignant neoplasm of upper-outer quadrant of left breast in female, estrogen receptor positive ([place])     SUMMARY OF ONCOLOGIC HISTORY:   Breast cancer of upper-outer quadrant of left female breast ([place])   05/18/2014 Mammogram    Suspicious left upper outer quadrant 5 cm segmental area of calcifications.     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    05/30/2014 Breast MRI    Left Breast: 12oclock: 21 x 23 x 30 mm, Numerous  level 1 and 2 LN; Liver lesions cycts by Liver MRI    06/07/2014 Initial Biopsy    Left axillary lymph node biopsy invasive ductal carcinoma ER 100%, PR 11% Ki-67 15% HER-2 negative ratio 1.41    06/13/2014 PET scan    left breast activity, hypermetabolic left axillary and left retropectoral lymph node, small lucent lesion in L1 vertebra which was biopsied and proven to be bone metastases    07/19/2014 Initial Biopsy    Biopsy of L1 vertebra metastatic carcinoma breast primary, ER 100%, PR 4%, HER-2 negative ratio 1.3    07/26/2014 - 12/18/2014 Neo-Adjuvant Chemotherapy    Even though she has L1 solitary bone metastases, patient is being treated definitively with neoadjuvant chemotherapy with dose dense Adriamycin and Cytoxan to be followed by weekly Taxol x12    12/25/2014 PET scan    Interval improvement in size and metabolic activity associated with left axillary lymph nodes  consistent with treatment response, no hypermetabolic distant metastases, L1 vertebral body has no activity    12/25/2014 Breast MRI    No residual enhancement left breast, decreased left axillary lymphadenopathy, indicating response to treatment but lymph nodes are still present    01/30/2015 Surgery    Left breast lumpectomy: Invasive ductal carcinoma grade 2, 1.2 cm, 12/15 lymph nodes positive, extracapsular tumor extension present, T1c N3M1 stage IV     03/12/2015 - 04/24/2015 Radiation Therapy     adjuvant radiation therapy with Dr. Isidore Moos    05/16/2015 - 05/2016 Anti-estrogen oral therapy    Anastrozole 1 mg daily 10 years is the plan; Kimberly Reed was recommended but she was not reachable and hence it was not filled, switched to tamoxifen 04/07/2016.  Was unable to tolerate Tamoxifen and stopped at some point in 2017, she isn't sure when.      01/10/2016 Imaging    CT chest abdomen pelvis: No evidence of metastatic disease    02/09/2018 Relapse/Recurrence    Extensive diffuse liver metastases 2.2 cm, 4.3 cm.  Hepatomegaly, bone metastases most apparent in thoracic spine, nonspecific lung nodules lower periesophageal lymph nodes suspicious    02/25/2018 Pathology Results    Liver biopsy: Metastatic breast cancer ER 100%, PR 10%, HER-2 negative ratio 1.41     03/10/2018 -  Anti-estrogen oral therapy    Ibrance with letrozole     CHIEF COMPLIANT: Follow-up on Ibrance with letrozole  INTERVAL HISTORY: Kimberly Reed is a 71-year with above-mentioned history of metastatic breast cancer with liver metastases is currently on treatment with  Ibrance with letrozole.  Overall she is tolerating the treatment fairly well.  Her major problems have been related to the white blood cell count as well as fatigue and her psychological issues.  Today she feels markedly better.  She is excited about moving into her new apartment in November.  She does me that in a separate account she found some additional money that  she is now spending towards her needs.  REVIEW OF SYSTEMS:   Constitutional: Denies fevers, chills or abnormal weight loss Eyes: Denies blurriness of vision Ears, nose, mouth, throat, and face: Denies mucositis or sore throat Respiratory: Denies cough, dyspnea or wheezes Cardiovascular: Denies palpitation, chest discomfort Gastrointestinal:  Denies nausea, heartburn or change in bowel habits Skin: Denies abnormal skin rashes Lymphatics: Denies new lymphadenopathy or easy bruising Neurological:Denies numbness, tingling or new weaknesses Behavioral/Psych: Mood is stable, no new changes  Extremities: No lower extremity edema  All other systems were reviewed with the patient and are negative.  I have reviewed the past medical history, past surgical history, social history and family history with the patient and they are unchanged from previous note.  ALLERGIES:  is allergic to other; effexor [venlafaxine]; and mucinex [guaifenesin er].  MEDICATIONS:  Current Outpatient Medications  Medication Sig Dispense Refill  . acetaminophen (TYLENOL) 325 MG tablet Take 2 tablets (650 mg total) by mouth every 6 (six) hours as needed for mild pain or moderate pain (or Fever >/= 101). 30 tablet 0  . brimonidine-timolol (COMBIGAN) 0.2-0.5 % ophthalmic solution Place 1 drop into both eyes every 12 (twelve) hours.     . dorzolamide (TRUSOPT) 2 % ophthalmic solution Place 1 drop into both eyes 2 (two) times daily.     [name] letrozole (FEMARA) 2.5 MG tablet Take 1 tablet (2.5 mg total) by mouth daily. 90 tablet 3  . levothyroxine (SYNTHROID, LEVOTHROID) 150 MCG tablet TAKE 1 TABLET BY MOUTH EVERY DAY ON EMPTY STOMACH IN THE MORNING  0  . Omega-3 Fatty Acids (FISH OIL) 1000 MG CAPS Take 1,000 mg by mouth daily.    . palbociclib (IBRANCE) 75 MG capsule Take 1 capsule (75 mg total) by mouth daily with breakfast. Take whole with food. Take for 21 days on, 7 days off, repeat every 28 days. 21 capsule 3  . Travoprost,  BAK Free, (TRAVATAN) 0.004 % SOLN ophthalmic solution Place 1 drop into both eyes at bedtime.      No current facility-administered medications for this visit.     PHYSICAL EXAMINATION: ECOG PERFORMANCE STATUS: 1 - Symptomatic but completely ambulatory  Vitals:   08/03/18 1113  BP: 132/64  Pulse: 64  Resp: 17  Temp: 98.4 F (36.9 C)  SpO2: 100%   Filed Weights   08/03/18 1113  Weight: 226 lb 4.8 oz (102.6 kg)    GENERAL:alert, no distress and comfortable SKIN: skin color, texture, turgor are normal, no rashes or significant lesions EYES: normal, Conjunctiva are pink and non-injected, sclera clear OROPHARYNX:no exudate, no erythema and lips, buccal mucosa, and tongue normal  NECK: supple, thyroid normal size, non-tender, without nodularity LYMPH:  no palpable lymphadenopathy in the cervical, axillary or inguinal LUNGS: clear to auscultation and percussion with normal breathing effort HEART: regular rate & rhythm and no murmurs and no lower extremity edema ABDOMEN:abdomen soft, non-tender and normal bowel sounds MUSCULOSKELETAL:no cyanosis of digits and no clubbing  NEURO: alert & oriented x 3 with fluent speech, no focal motor/sensory deficits EXTREMITIES: No lower extremity edema   LABORATORY DATA:  I  have reviewed the data as listed CMP Latest Ref Rng & Units 08/03/2018 07/06/2018 06/08/2018  Glucose 70 - 99 mg/dL 114(H) 110(H) 109(H)  BUN 8 - 23 mg/dL 18 27(H) 36(H)  Creatinine 0.44 - 1.00 mg/dL 1.19(H) 1.36(H) 1.15(H)  Sodium 135 - 145 mmol/L 139 140 141  Potassium 3.5 - 5.1 mmol/L 4.1 4.3 4.4  Chloride 98 - 111 mmol/L 107 108 109  CO2 22 - 32 mmol/L 24 24 21(L)  Calcium 8.9 - 10.3 mg/dL 10.6(H) 10.1 10.1  Total Protein 6.5 - 8.1 g/dL 7.3 6.9 7.1  Total Bilirubin 0.3 - 1.2 mg/dL 0.9 0.8 0.6  Alkaline Phos 38 - 126 U/L 253(H) 203(H) 200(H)  AST 15 - 41 U/L 155(H) 61(H) 67(H)  ALT 0 - 44 U/L 95(H) 32 48(H)    Lab Results  Component Value Date   WBC 2.6 (L)  08/03/2018   HGB 9.5 (L) 08/03/2018   HCT 28.2 (L) 08/03/2018   MCV 104.8 (H) 08/03/2018   PLT 230 08/03/2018   NEUTROABS 1.0 (L) 08/03/2018    ASSESSMENT & PLAN:  Breast cancer of upper-outer quadrant of left female breast ([place]) Metastatic breast cancer in the left breast involving left axillary lymph nodes and isolated L1 vertebral metastases stage IV disease, ER 100 percent, PR 4%, HER-2 negative ratio 1.3 Ki-67 15%  CTCAP4/17/2019: Extensive diffuse hepatic metastases, secondary hepatomegaly, bone metastases in the thoracic spine, nonspecific pulmonary nodules, lower periesophageal lymph nodes Liver biopsy 02/24/2018: Metastatic breast cancer,ER 100%, PR 10%, HER-2 negative ratio 1.41  Current treatment:Ibrance with letrozole started 03/11/2018 Ibrance toxicities: 1.Neutropenia: Today's ANCis. Current dosage of Ibrance: 75 mg I recommended continuation of the current dosing. 2. Fatigue  CT CAP: 05/26/2018 interval increase in the size of innumerable low-attenuation liver metastatic lesions, interval progression of lytic bone metastases, interval increase in the lower paraesophageal adenopathy  CT head: Multiple large and small lytic lesions throughout the skull  Next scans will be performed November 2019  Major depression: Patient needs a lot of support and is seeing a therapist.  She has limited social support systems.  She has met with our chaplain as well as social workers.  Dizziness: Previously required IV fluids for dehydration  Anemia: Macrocytic related to Ibrance.  Monitoring it closely. Return to clinic in 1 month for labs and follow-up after scans    Orders Placed This Encounter  Procedures  . CT Abdomen Pelvis W Contrast    Standing Status:   Future    Standing Expiration Date:   08/03/2019    Order Specific Question:   ** REASON FOR EXAM (FREE TEXT)    Answer:   Metastatic breast cancer restaging    Order Specific Question:   If indicated for the  ordered procedure, I authorize the administration of contrast media per Radiology protocol    Answer:   Yes    Order Specific Question:   Preferred imaging location?    Answer:   [facility]    Order Specific Question:   Is Oral Contrast requested for this exam?    Answer:   Yes, Per Radiology protocol    Order Specific Question:   Radiology Contrast Protocol - do NOT remove file path    Answer:   \\[email or web address]  . CT Chest W Contrast    Standing Status:   Future    Standing Expiration Date:   08/03/2019    Order Specific Question:   ** REASON FOR EXAM (FREE TEXT)    Answer:  Met breast cancer restaging    Order Specific Question:   If indicated for the ordered procedure, I authorize the administration of contrast media per Radiology protocol    Answer:   Yes    Order Specific Question:   Preferred imaging location?    Answer:   [facility]    Order Specific Question:   Radiology Contrast Protocol - do NOT remove file path    Answer:   \\[email or web address]  . NM Bone Scan Whole Body    Standing Status:   Future    Standing Expiration Date:   08/03/2019    Order Specific Question:   ** REASON FOR EXAM (FREE TEXT)    Answer:   Met Breast cancer restaging    Order Specific Question:   If indicated for the ordered procedure, I authorize the administration of a radiopharmaceutical per Radiology protocol    Answer:   Yes    Order Specific Question:   Preferred imaging location?    Answer:   [facility]    Order Specific Question:   Radiology Contrast Protocol - do NOT remove file path    Answer:   \\[email or web address]   The patient has a good understanding of the overall plan. she agrees with it. she will call with any problems that may develop before the next visit here.   Harriette Ohara, MD 08/03/18

## 2018-08-03 NOTE — Telephone Encounter (Signed)
Gave patient avs and calendar.  Checked with VG and nurse.  Schedule lab for 5 weeks and VG for six weeks.  Gave patient contrast for CT and they will call her with appts.

## 2018-08-03 NOTE — Assessment & Plan Note (Signed)
Metastatic breast cancer in the left breast involving left axillary lymph nodes and isolated L1 vertebral metastases stage IV disease, ER 100 percent, PR 4%, HER-2 negative ratio 1.3 Ki-67 15%  CTCAP4/17/2019: Extensive diffuse hepatic metastases, secondary hepatomegaly, bone metastases in the thoracic spine, nonspecific pulmonary nodules, lower periesophageal lymph nodes Liver biopsy 02/24/2018: Metastatic breast cancer,ER 100%, PR 10%, HER-2 negative ratio 1.41  Current treatment:Ibrance with letrozole started 03/11/2018 Ibrance toxicities: 1.Neutropenia: Today's ANCis. Current dosage of Ibrance: 75 mg I recommended continuation of the current dosing. 2. Fatigue  CT CAP: 05/26/2018 interval increase in the size of innumerable low-attenuation liver metastatic lesions, interval progression of lytic bone metastases, interval increase in the lower paraesophageal adenopathy  CT head: Multiple large and small lytic lesions throughout the skull  Next scans will be performed November 2019  Major depression: Patient needs a lot of support and is seeing a therapist.  She has limited social support systems.  She has met with our chaplain as well as social workers.  Dizziness: Previously required IV fluids for dehydration  Anemia: Macrocytic related to Ibrance.  Monitoring it closely. Return to clinic in 1 month for labs and follow-up after scans

## 2018-08-03 NOTE — Progress Notes (Signed)
error 

## 2018-08-11 NOTE — Progress Notes (Signed)
[facility]  PATIENT: Kimberly Reed DOB: 05-02-1956   REASON FOR VISIT: Obstructive sleep apnea here for initial CPAP compliance HISTORY FROM: Patient alone at visit    HISTORY OF PRESENT ILLNESS:  Kimberly Reed is a 62 y.o. female , seen here as in a referral/ revisit  from Dr. Jannifer Franklin for a sleep apnea evaluation.  Chief complaint according to patient : " I had insomnia last year, not now, but I may have apnea"  P M Hx: see Epic note. Patient is confused, distracted .  She did not fill any paper work out, and she is on her phone calling her work place while in exam room 10.   Last year she had chronic insomnia for many many months, she did have limited daytime functioning at work, but she feels cognitively more impaired now..  Sleep habits are as follows: On average her most common bedtime is between 10 and 10:30 PM, she does currently not have trouble falling asleep, mostly promptly asleep at this time. She can sleep through until 2 or 3 AM and she has her first bathroom break, a total of 2-3 times each night. She rises in the morning at 6:30, she is often spontaneously awake before her alarm rings at that time. She feels groggy, not restored not refreshed. Due to lymphedema in her left arm she has also problems finding a comfortable sleep position. She wakes up with headaches but the headaches do not wake her. She wears an elastic sleeve. At night she has a Velcro brace on her left arm, and she feels as if the time of beginning to use a Velcro brace has been the time when she started to have more vivid dreams, strange and sometimes lucid dreams. These dreams are emotionally taxing, and they seem to be triggered by emotions. But not nightmarish. She gets between 4 and 6 hours of sleep, describes her sleep as fragmented. On weekends, she sleeps all day and night and feels even more groggy- not refreshed.  She thinks she snores, she has a dry mouth. Mondays are  difficult at work.  UPDATE 10/21/2019CM Kimberly Reed, 62 year old female returns for follow-up with newly diagnosed obstructive sleep apnea here for CPAP follow-up.  She also has a history of insomnia.  She has stage IV breast cancer.  She had a hospital admission in March for acute renal failure.  Since that time she has been seen in the ER for septic bursitis of the elbow and a wound infection.  She has severe lymphedema of her left upper extremity.  CPAP compliance dated 07/16/2018-08/14/2018 shows compliance greater than 4 hours at 53%.  Average usage 4 hours 15 minutes.  Set pressure 5 to 15 cm.  EPR level 3 significant leak 95th percentile 26.2.  AHI 1.7 she returns for reevaluation. REVIEW OF SYSTEMS: Full 14 system review of systems performed and notable only for those listed, all others are neg:  Constitutional: Fatigue Cardiovascular: neg Ear/Nose/Throat: neg  Skin: neg Eyes: neg Respiratory: neg Gastroitestinal: Swollen abdomen nausea vomiting Hematology/Lymphatic: neg  Endocrine: Intolerance to heat and cold Musculoskeletal:neg Allergy/Immunology: neg Neurological: Memory loss  Psychiatric: Depression anxiety Sleep : Obstructive sleep apnea with CPAP   ALLERGIES: Allergies  Allergen Reactions  . Other Nausea Only and Other (See Comments)    Antibiotics - Pt cannot identify which antibiotics she reacts to.  Make her nausous and gives her headache.  . Effexor [Venlafaxine] Nausea And Vomiting  . Mucinex [Guaifenesin Er] Nausea Only  HOME MEDICATIONS: Outpatient Medications Prior to Visit  Medication Sig Dispense Refill  . acetaminophen (TYLENOL) 325 MG tablet Take 2 tablets (650 mg total) by mouth every 6 (six) hours as needed for mild pain or moderate pain (or Fever >/= 101). 30 tablet 0  . b complex vitamins tablet Take 1 tablet by mouth daily.    . brimonidine-timolol (COMBIGAN) 0.2-0.5 % ophthalmic solution Place 1 drop into both eyes every 12 (twelve) hours.     .  dorzolamide (TRUSOPT) 2 % ophthalmic solution Place 1 drop into both eyes 2 (two) times daily.     [name] letrozole (FEMARA) 2.5 MG tablet Take 1 tablet (2.5 mg total) by mouth daily. 90 tablet 3  . levothyroxine (SYNTHROID, LEVOTHROID) 150 MCG tablet TAKE 1 TABLET BY MOUTH EVERY DAY ON EMPTY STOMACH IN THE MORNING  0  . Omega-3 Fatty Acids (FISH OIL) 1000 MG CAPS Take 1,000 mg by mouth daily.    [name] OVER THE COUNTER MEDICATION [place] supplement    . OVER THE COUNTER MEDICATION Tumeric    . palbociclib (IBRANCE) 75 MG capsule Take 1 capsule (75 mg total) by mouth daily with breakfast. Take whole with food. Take for 21 days on, 7 days off, repeat every 28 days. 21 capsule 3  . Probiotic Product (PROBIOTIC PO) Take by mouth.    . SELENIUM PO Take by mouth.    . Travoprost, BAK Free, (TRAVATAN) 0.004 % SOLN ophthalmic solution Place 1 drop into both eyes at bedtime.      No facility-administered medications prior to visit.     PAST MEDICAL HISTORY: Past Medical History:  Diagnosis Date  . Anemia   . Anxiety   . Atypical chest pain   . Breast cancer ([place]) 05/2014   left. completed chemo 10/2014. Did not tolerate anti-estrogen.   . Cancer ([place])    left breast dx. 7'82- chemo planned to start 07-09-14- Dr. Lindi Adie[facility]  . Depression   . Family history of heart disease   . Glaucoma   . Gluten intolerance    Dr. Collene Mares  . Headache syndrome 10/13/2016  . History of syncope 2008   loss of bladder control eval by Neuro  . Hot flashes   . Hyperlipemia   . Hypothyroid   . Insomnia   . Lymphedema    L arm  . Migraine without aura   . Personal history of chemotherapy   . Personal history of radiation therapy   . Shingles 2018  . Wears glasses     PAST SURGICAL HISTORY: Past Surgical History:  Procedure Laterality Date  . BREAST BIOPSY Left 06/07/2014   malignant  . BREAST BIOPSY Left 05/24/2014   malignant  . BREAST LUMPECTOMY Left 01/30/2015   malignant  . BREAST  LUMPECTOMY WITH NEEDLE LOCALIZATION AND AXILLARY LYMPH NODE DISSECTION Left 01/30/2015   Procedure: BREAST LUMPECTOMY WITH NEEDLE LOCALIZATION LEFT AXILLARY DISSECTION REMOVAL OF PORT;  Surgeon: [name], MD;  Location: [facility];  Service: General;  Laterality: Left;  . CATARACT EXTRACTION Left   . DILATION AND CURETTAGE OF UTERUS    . ESOPHAGOGASTRODUODENOSCOPY  06/01/14  . EXAMINATION UNDER ANESTHESIA  02/24/2013   Procedure: EXAM UNDER ANESTHESIA;  Surgeon: Azalia Bilis, MD;  Location: [facility];  Service: Gynecology;;  with removal of prolapsing cervical mass; pap smear and endometrial biopsy  . GLAUCOMA SURGERY Left    x1   . PORTACATH PLACEMENT Right 07/06/2014   Procedure: PORT A  CATH  PLACEMENT;  Surgeon: [name], MD;  Location: [facility];  Service: General;  Laterality: Right;  . TONSILLECTOMY     child  . uterine polyp removed     per hysteroscopy about 1 1/2 yrs ago.    FAMILY HISTORY: Family History  Problem Relation Age of Onset  . Kidney disease Mother   . Depression Mother   . Early death Mother   . Hypertension Mother   . Heart attack Mother   . Aortic aneurysm Mother   . Hypertension Father   . Dementia Father   . Depression Father   . Kidney disease Brother   . Hypertension Sister   . Heart disease Unknown        "all of moms side"  . Depression Unknown   . Heart failure Cousin     SOCIAL HISTORY: Social History   Socioeconomic History  . Marital status: Single    Spouse name: Not on file  . Number of children: 0  . Years of education: Some college  . Highest education level: Not on file  Occupational History  . 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 Financial resource strain: Not on file  . Food insecurity:    Worry: Not on file    Inability: Not on file  . Transportation needs:    Medical: Not on file    Non-medical: Not on file  Tobacco Use  .  Smoking status: Never Smoker  . Smokeless tobacco: Never Used  Substance and Sexual Activity  . Alcohol use: No  . Drug use: No    Comment: in 20's but not now  . Sexual activity: Not Currently    Comment: menarche age 37, P 0, no HRT  Lifestyle  . Physical activity:    Days per week: Not on file    Minutes per session: Not on file  . Stress: Not on file  Relationships  . Social connections:    Talks on phone: Not on file    Gets together: Not on file    Attends religious service: Not on file    Active member of club or organization: Not on file    Attends meetings of clubs or organizations: Not on file    Relationship status: Not on file  . Intimate partner violence:    Fear of current or ex partner: Not on file    Emotionally abused: Not on file    Physically abused: Not on file    Forced sexual activity: Not on file  Other Topics Concern  . Not on file  Social History Narrative   Single.  College.  Lives alone.  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   Right-handed   Dairy products, takes a daily vitamin, drinks caffeine, uses herbal remedies.   Alarm at home.  Wears her seatbelt.        PHYSICAL EXAM  Vitals:   08/15/18 1430  BP: 113/76  Pulse: 67  Weight: 230 lb 6.4 oz (104.5 kg)  Height: 5\' 5"  (1.651 m)   Body mass index is 38.34 kg/m.  Generalized: Well developed, obese female in no acute distress  Head: normocephalic and atraumatic,. Oropharynx benign [place] 4 Neck: Supple, circumference 17 Cardiac: Regular rate rhythm, no murmur  Lungs clear Musculoskeletal: No deformity  Skin lymphedema left arm Neurological examination   Mentation: Alert oriented to time, place, history taking. Attention  span and concentration appropriate. Recent and remote memory intact.  Follows all commands speech and language fluent.   Cranial nerve II-XII: Pupils were equal round reactive to light extraocular movements were full, visual field were  full on confrontational test. Facial sensation and strength were normal. hearing was intact to finger rubbing bilaterally. Uvula tongue midline. head turning and shoulder shrug were normal and symmetric.Tongue protrusion into cheek strength was normal. Motor: normal bulk and tone, full strength in the BUE, BLE,  Sensory: normal and symmetric to light touch,  Coordination: finger-nose-finger, no dysmetria. Gait and Station: Rising up from seated position without assistance, wide-based stance,  moderate stride, no difficulty with turns, no assistive device DIAGNOSTIC DATA (LABS, IMAGING, TESTING) - I reviewed patient records, labs, notes, testing and imaging myself where available.  Lab Results  Component Value Date   WBC 2.6 (L) 08/03/2018   HGB 9.5 (L) 08/03/2018   HCT 28.2 (L) 08/03/2018   MCV 104.8 (H) 08/03/2018   PLT 230 08/03/2018      Component Value Date/Time   NA 139 08/03/2018 1100   NA 141 08/10/2017   NA 141 01/10/2016 1539   K 4.1 08/03/2018 1100   K 3.8 01/10/2016 1539   CL 107 08/03/2018 1100   CO2 24 08/03/2018 1100   CO2 29 01/10/2016 1539   GLUCOSE 114 (H) 08/03/2018 1100   GLUCOSE 115 01/10/2016 1539   BUN 18 08/03/2018 1100   BUN 18 08/10/2017   BUN 18.1 01/10/2016 1539   CREATININE 1.19 (H) 08/03/2018 1100   CREATININE 1.0 01/10/2016 1539   CALCIUM 10.6 (H) 08/03/2018 1100   CALCIUM 10.1 01/10/2016 1539   PROT 7.3 08/03/2018 1100   PROT 7.4 01/10/2016 1539   ALBUMIN 3.4 (L) 08/03/2018 1100   ALBUMIN 4.1 01/10/2016 1539   AST 155 (H) 08/03/2018 1100   AST 15 01/10/2016 1539   ALT 95 (H) 08/03/2018 1100   ALT 17 01/10/2016 1539   ALKPHOS 253 (H) 08/03/2018 1100   ALKPHOS 92 01/10/2016 1539   BILITOT 0.9 08/03/2018 1100   BILITOT 0.50 01/10/2016 1539   GFRNONAA 47 (L) 08/03/2018 1100   GFRAA 55 (L) 08/03/2018 1100   Lab Results  Component Value Date   CHOL 248 (H) 01/16/2015   HDL 53.10 01/16/2015   LDLCALC 176 (H) 01/16/2015   LDLDIRECT 167.3  03/16/2011   TRIG 96.0 01/16/2015   CHOLHDL 5 01/16/2015   No results found for: HGBA1C Lab Results  Component Value Date   VITAMINB12 427 05/01/2016   Lab Results  Component Value Date   TSH 7.04 (A) 02/15/2018      ASSESSMENT AND PLAN  62 y.o. year old female here to follow-up for newly diagnosed obstructive sleep apnea with initial CPAP.Compliance dated 07/16/2018-08/14/2018 shows compliance greater than 4 hours at 53%.  Average usage 4 hours 15 minutes.  Set pressure 5 to 15 cm.  EPR level 3 significant leak 95th percentile 26.2.  AHI 1.7  PLAN: CPAP compliance 53% greater than 4 hours Has significant leak needs mask refit Follow-up in 4 months Kimberly Bible, [facility], [facility], APRN  [facility] [address] [place], [place] [address] [phone]

## 2018-08-14 ENCOUNTER — Encounter: Payer: Self-pay | Admitting: Nurse Practitioner

## 2018-08-15 ENCOUNTER — Ambulatory Visit: Payer: Medicare Other | Admitting: Nurse Practitioner

## 2018-08-15 ENCOUNTER — Encounter: Payer: Self-pay | Admitting: Nurse Practitioner

## 2018-08-15 DIAGNOSIS — G4733 Obstructive sleep apnea (adult) (pediatric): Secondary | ICD-10-CM

## 2018-08-15 DIAGNOSIS — Z9989 Dependence on other enabling machines and devices: Secondary | ICD-10-CM

## 2018-08-15 NOTE — Patient Instructions (Signed)
CPAP compliance 53% greater than 4 hours Has significant leak needs mask refit Follow-up in 4 months

## 2018-08-16 NOTE — Progress Notes (Signed)
Relayed note to [facility], Sallee Lange thru [employer] that have cpap order in Epic. 08-15-18 sy

## 2018-08-22 ENCOUNTER — Other Ambulatory Visit: Payer: Self-pay | Admitting: Hematology and Oncology

## 2018-08-22 DIAGNOSIS — C50412 Malignant neoplasm of upper-outer quadrant of left female breast: Secondary | ICD-10-CM

## 2018-08-22 DIAGNOSIS — Z170 Estrogen receptor positive status [ER+]: Principal | ICD-10-CM

## 2018-08-31 ENCOUNTER — Other Ambulatory Visit: Payer: Medicare Other

## 2018-09-01 ENCOUNTER — Ambulatory Visit (HOSPITAL_COMMUNITY): Payer: Medicare Other

## 2018-09-01 ENCOUNTER — Encounter (HOSPITAL_COMMUNITY): Payer: Medicare Other

## 2018-09-05 ENCOUNTER — Telehealth: Payer: Self-pay

## 2018-09-05 NOTE — Telephone Encounter (Signed)
Pt calling to report that she had just restarted back on her ibrance dose this weekend, but she has been having stomach cramping and diarrhea lately. 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 Pt expressed her recent stress with moving on her own, as well as the anticipation of her upcoming scan results. Advised pt to take imodium for the diarrhea, and to drink plenty of fluids throughout the day. Also, suggested that pt is getting enough sleep at night, as well as 30 minutes of activity, such as walking. Pt expressed that she has been so stressed moving to a new place by herself. Asked if she knows any friends, family or neighbor that could help her with the move to reduce the stress put on her body and mind. Pt states that she can call her friend to help. Pt will be seeing Dr.Gudena after her scans next week. No further needs at this time.

## 2018-09-07 ENCOUNTER — Other Ambulatory Visit: Payer: Medicare Other

## 2018-09-08 ENCOUNTER — Ambulatory Visit: Payer: Medicare Other | Admitting: Hematology and Oncology

## 2018-09-08 ENCOUNTER — Telehealth: Payer: Self-pay | Admitting: Neurology

## 2018-09-08 NOTE — Telephone Encounter (Signed)
Received a notification that patient went and took her machine to [facility] and turned it back in.   Patient was a walk in to the office this afternoon. Patient states she can not tolerate the device. Patient has cancer and other health issues that cause her not to be able to get comfortable and rested. She said that she is struggling to use it daily as required and wants to return it. Patient was not d/c from physician but was adamant about return of machine. Pick up ticket and AMA were generated, reviewed and signed by patient. Patient received a copy of both documents. I have emailed a copy, as well, to [place] so that the doctor can be notified. Docs scanned into BDM and attached to sales order. WIP'd to biller.  I have attached the P/Up and AMA if doctor needs to know her reasons as to why she returned it."

## 2018-09-12 ENCOUNTER — Ambulatory Visit (HOSPITAL_COMMUNITY)
Admission: RE | Admit: 2018-09-12 | Discharge: 2018-09-12 | Disposition: A | Discharge status: Home / Self Care | Payer: Medicare Other | Source: Ambulatory Visit | Attending: Hematology and Oncology | Admitting: Hematology and Oncology

## 2018-09-12 ENCOUNTER — Encounter (HOSPITAL_COMMUNITY)
Admission: RE | Admit: 2018-09-12 | Discharge: 2018-09-12 | Disposition: A | Discharge status: Home / Self Care | Payer: Medicare Other | Source: Ambulatory Visit | Attending: Hematology and Oncology | Admitting: Hematology and Oncology

## 2018-09-12 DIAGNOSIS — Z170 Estrogen receptor positive status [ER+]: Secondary | ICD-10-CM | POA: Diagnosis not present

## 2018-09-12 DIAGNOSIS — C50412 Malignant neoplasm of upper-outer quadrant of left female breast: Secondary | ICD-10-CM | POA: Diagnosis not present

## 2018-09-12 DIAGNOSIS — C787 Secondary malignant neoplasm of liver and intrahepatic bile duct: Secondary | ICD-10-CM | POA: Insufficient documentation

## 2018-09-12 DIAGNOSIS — C7951 Secondary malignant neoplasm of bone: Secondary | ICD-10-CM | POA: Insufficient documentation

## 2018-09-12 DIAGNOSIS — R188 Other ascites: Secondary | ICD-10-CM | POA: Diagnosis not present

## 2018-09-12 MED ORDER — IOHEXOL 300 MG/ML  SOLN
100.0000 mL | Freq: Once | INTRAMUSCULAR | Status: AC | PRN
  Administered 2018-09-12: 100 mL via INTRAVENOUS

## 2018-09-12 MED ORDER — SODIUM CHLORIDE (PF) 0.9 % IJ SOLN
INTRAMUSCULAR | Status: AC
  Filled 2018-09-12: qty 50

## 2018-09-12 MED ORDER — TECHNETIUM TC 99M MEDRONATE IV KIT
19.6000 | PACK | Freq: Once | INTRAVENOUS | Status: AC | PRN
  Administered 2018-09-12: 19.6 via INTRAVENOUS

## 2018-09-13 ENCOUNTER — Other Ambulatory Visit: Payer: Self-pay

## 2018-09-13 DIAGNOSIS — C50412 Malignant neoplasm of upper-outer quadrant of left female breast: Secondary | ICD-10-CM

## 2018-09-14 ENCOUNTER — Telehealth: Payer: Self-pay | Admitting: Hematology and Oncology

## 2018-09-14 ENCOUNTER — Inpatient Hospital Stay: Payer: Medicare Other | Attending: Hematology and Oncology | Admitting: Hematology and Oncology

## 2018-09-14 ENCOUNTER — Encounter: Payer: Self-pay | Admitting: Hematology and Oncology

## 2018-09-14 ENCOUNTER — Encounter: Payer: Self-pay | Admitting: General Practice

## 2018-09-14 ENCOUNTER — Inpatient Hospital Stay: Payer: Medicare Other

## 2018-09-14 DIAGNOSIS — Z170 Estrogen receptor positive status [ER+]: Secondary | ICD-10-CM | POA: Diagnosis not present

## 2018-09-14 DIAGNOSIS — C7951 Secondary malignant neoplasm of bone: Secondary | ICD-10-CM | POA: Insufficient documentation

## 2018-09-14 DIAGNOSIS — D701 Agranulocytosis secondary to cancer chemotherapy: Secondary | ICD-10-CM | POA: Insufficient documentation

## 2018-09-14 DIAGNOSIS — Z923 Personal history of irradiation: Secondary | ICD-10-CM | POA: Diagnosis not present

## 2018-09-14 DIAGNOSIS — C50412 Malignant neoplasm of upper-outer quadrant of left female breast: Secondary | ICD-10-CM | POA: Insufficient documentation

## 2018-09-14 DIAGNOSIS — C787 Secondary malignant neoplasm of liver and intrahepatic bile duct: Secondary | ICD-10-CM | POA: Diagnosis not present

## 2018-09-14 DIAGNOSIS — F329 Major depressive disorder, single episode, unspecified: Secondary | ICD-10-CM | POA: Diagnosis not present

## 2018-09-14 DIAGNOSIS — E8809 Other disorders of plasma-protein metabolism, not elsewhere classified: Secondary | ICD-10-CM | POA: Insufficient documentation

## 2018-09-14 DIAGNOSIS — R188 Other ascites: Secondary | ICD-10-CM | POA: Diagnosis not present

## 2018-09-14 DIAGNOSIS — Z79811 Long term (current) use of aromatase inhibitors: Secondary | ICD-10-CM

## 2018-09-14 DIAGNOSIS — C773 Secondary and unspecified malignant neoplasm of axilla and upper limb lymph nodes: Secondary | ICD-10-CM | POA: Insufficient documentation

## 2018-09-14 DIAGNOSIS — Z23 Encounter for immunization: Secondary | ICD-10-CM | POA: Diagnosis not present

## 2018-09-14 LAB — CMP (CANCER CENTER ONLY)
ALT: 45 U/L — ABNORMAL HIGH (ref 0–44)
AST: 175 U/L (ref 15–41)
Albumin: 3.2 g/dL — ABNORMAL LOW (ref 3.5–5.0)
Alkaline Phosphatase: 274 U/L — ABNORMAL HIGH (ref 38–126)
Anion gap: 12 (ref 5–15)
BUN: 26 mg/dL — ABNORMAL HIGH (ref 8–23)
CHLORIDE: 108 mmol/L (ref 98–111)
CO2: 22 mmol/L (ref 22–32)
Calcium: 10.8 mg/dL — ABNORMAL HIGH (ref 8.9–10.3)
Creatinine: 1.8 mg/dL — ABNORMAL HIGH (ref 0.44–1.00)
GFR, EST AFRICAN AMERICAN: 33 mL/min — AB (ref >60)
GFR, Estimated: 29 mL/min — ABNORMAL LOW (ref >60)
Glucose, Bld: 100 mg/dL — ABNORMAL HIGH (ref 70–99)
POTASSIUM: 3.8 mmol/L (ref 3.5–5.1)
Sodium: 142 mmol/L (ref 135–145)
Total Bilirubin: 2.1 mg/dL — ABNORMAL HIGH (ref 0.3–1.2)
Total Protein: 7.1 g/dL (ref 6.5–8.1)

## 2018-09-14 LAB — CBC WITH DIFFERENTIAL (CANCER CENTER ONLY)
Abs Immature Granulocytes: 0.01 10*3/uL (ref 0.00–0.07)
Basophils Absolute: 0 10*3/uL (ref 0.0–0.1)
Basophils Relative: 1 %
Eosinophils Absolute: 0.1 10*3/uL (ref 0.0–0.5)
Eosinophils Relative: 3 %
HCT: 28.1 % — ABNORMAL LOW (ref 36.0–46.0)
Hemoglobin: 9.8 g/dL — ABNORMAL LOW (ref 12.0–15.0)
Immature Granulocytes: 0 %
Lymphocytes Relative: 34 %
Lymphs Abs: 1.1 10*3/uL (ref 0.7–4.0)
MCH: 35.4 pg — ABNORMAL HIGH (ref 26.0–34.0)
MCHC: 34.9 g/dL (ref 30.0–36.0)
MCV: 101.4 fL — ABNORMAL HIGH (ref 80.0–100.0)
Monocytes Absolute: 0.2 10*3/uL (ref 0.1–1.0)
Monocytes Relative: 7 %
Neutro Abs: 1.8 10*3/uL (ref 1.7–7.7)
Neutrophils Relative %: 55 %
Platelet Count: 315 10*3/uL (ref 150–400)
RBC: 2.77 MIL/uL — ABNORMAL LOW (ref 3.87–5.11)
RDW: 16.7 % — ABNORMAL HIGH (ref 11.5–15.5)
WBC Count: 3.3 10*3/uL — ABNORMAL LOW (ref 4.0–10.5)
nRBC: 0 % (ref 0.0–0.2)

## 2018-09-14 MED ORDER — FUROSEMIDE 20 MG PO TABS: 20.0000 mg | ORAL_TABLET | Freq: Two times a day (BID) | ORAL | 3 refills | Status: AC

## 2018-09-14 MED ORDER — INFLUENZA VAC SPLIT QUAD 0.5 ML IM SUSY
PREFILLED_SYRINGE | INTRAMUSCULAR | Status: AC
  Filled 2018-09-14: qty 0.5

## 2018-09-14 MED ORDER — INFLUENZA VAC SPLIT QUAD 0.5 ML IM SUSY
0.5000 mL | PREFILLED_SYRINGE | Freq: Once | INTRAMUSCULAR | Status: AC
  Administered 2018-09-14: 0.5 mL via INTRAMUSCULAR

## 2018-09-14 NOTE — Telephone Encounter (Signed)
Gave avs and calendar ° °

## 2018-09-14 NOTE — Assessment & Plan Note (Signed)
Metastatic breast cancer in the left breast involving left axillary lymph nodes and isolated L1 vertebral metastases stage IV disease, ER 100 percent, PR 4%, HER-2 negative ratio 1.3 Ki-67 15%  CTCAP4/17/2019: Extensive diffuse hepatic metastases, secondary hepatomegaly, bone metastases in the thoracic spine, nonspecific pulmonary nodules, lower periesophageal lymph nodes Liver biopsy 02/24/2018: Metastatic breast cancer,ER 100%, PR 10%, HER-2 negative ratio 1.41  Current treatment:Ibrance with letrozole started 03/11/2018 Ibrance toxicities: 1.Neutropenia: Today's ANCis. Current dosage of Ibrance: 75 mg I recommended continuation of the current dosing. 2.Fatigue  CT CAP: 05/26/2018 interval increase in the size of innumerable low-attenuation liver metastatic lesions, interval progression of lytic bone metastases, interval increase in the lower paraesophageal adenopathy  Major depression: Patient needs a lot of support and is seeing a therapist. She has limited social support systems. She has met with our chaplain as well as social workers.  Anemia: Macrocytic related to Ibrance. Monitoring it closely.  CT CAP: 09/12/2018: As of metastatic disease in the liver, increasing ascites similar bone metastases Bone scan 09/12/2018: Bone metastases noted in the skull, thoracic spine, ribs  Radiology review: I discussed with her that overall she has stable disease  Continue with the current treatment of Ibrance with letrozole.  Xgeva for bone metastases will be initiated.

## 2018-09-14 NOTE — Progress Notes (Signed)
Received phone call from Dr. Lindi Adie nurse for today.   Dr wants to give Xgeva. I was unable to find older referral from 2016 possibly.   Request for tx has been submitted to [facility], according to clinical team/nurse, pt has not received this tx in the past.   Submitted d/t [facility] Medicare [place] rules for 07/26/2018  Request # [id number]

## 2018-09-14 NOTE — Progress Notes (Signed)
Kimberly Reed  Accompanied Kimberly Reed and her friend/caregiver Kimberly Reed to appt with Dr Lindi Adie per Kimberly Reed's request. Because of her significant edema, very infrequent urination, and GI distress (limited appetite and ability to eat without feeling sick, alternating diarrhea and constipation), Kimberly Reed has been very anxious about what her scan results would show. She was relieved to learn that most lesions are holding steady and hopeful that diuretic may provide relief from some of her other symptoms. She is working hard to furnish her new apartment, which is bringing her purpose, meaning, and pleasure. Kimberly Reed knows to contact chaplain whenever needed/desired, but please also page if immediate needs arise or circumstances change. Thank you.   [place], [place], [facility] Pager [phone] Voicemail [phone]

## 2018-09-14 NOTE — Progress Notes (Signed)
Patient Care Team: Ma Hillock, DO as PCP - General (Family Medicine) Nicholas Lose, MD as Consulting Physician (Hematology and Oncology) [name], MD as Consulting Physician (General Surgery) Eppie Gibson, MD as Attending Physician (Radiation Oncology) Particia Nearing, MD as Referring Physician (Gastroenterology) Delrae Rend, MD as Consulting Physician (Endocrinology) Kathrynn Ducking, MD as Consulting Physician (Neurology)  DIAGNOSIS:  Encounter Diagnosis  Name Primary?  . Malignant neoplasm of upper-outer quadrant of left breast in female, estrogen receptor positive ([place])     SUMMARY OF ONCOLOGIC HISTORY:   Breast cancer of upper-outer quadrant of left female breast ([place])   05/18/2014 Mammogram    Suspicious left upper outer quadrant 5 cm segmental area of calcifications.     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    05/30/2014 Breast MRI    Left Breast: 12oclock: 21 x 23 x 30 mm, Numerous  level 1 and 2 LN; Liver lesions cycts by Liver MRI    06/07/2014 Initial Biopsy    Left axillary lymph node biopsy invasive ductal carcinoma ER 100%, PR 11% Ki-67 15% HER-2 negative ratio 1.41    06/13/2014 PET scan    left breast activity, hypermetabolic left axillary and left retropectoral lymph node, small lucent lesion in L1 vertebra which was biopsied and proven to be bone metastases    07/19/2014 Initial Biopsy    Biopsy of L1 vertebra metastatic carcinoma breast primary, ER 100%, PR 4%, HER-2 negative ratio 1.3    07/26/2014 - 12/18/2014 Neo-Adjuvant Chemotherapy    Even though she has L1 solitary bone metastases, patient is being treated definitively with neoadjuvant chemotherapy with dose dense Adriamycin and Cytoxan to be followed by weekly Taxol x12    12/25/2014 PET scan    Interval improvement in size and metabolic activity associated with left axillary lymph nodes  consistent with treatment response, no hypermetabolic distant metastases, L1 vertebral body has no activity    12/25/2014 Breast MRI    No residual enhancement left breast, decreased left axillary lymphadenopathy, indicating response to treatment but lymph nodes are still present    01/30/2015 Surgery    Left breast lumpectomy: Invasive ductal carcinoma grade 2, 1.2 cm, 12/15 lymph nodes positive, extracapsular tumor extension present, T1c N3M1 stage IV     03/12/2015 - 04/24/2015 Radiation Therapy     adjuvant radiation therapy with Dr. Isidore Moos    05/16/2015 - 05/2016 Anti-estrogen oral therapy    Anastrozole 1 mg daily 10 years is the plan; [name] was recommended but she was not reachable and hence it was not filled, switched to tamoxifen 04/07/2016.  Was unable to tolerate Tamoxifen and stopped at some point in 2017, she isn't sure when.      01/10/2016 Imaging    CT chest abdomen pelvis: No evidence of metastatic disease    02/09/2018 Relapse/Recurrence    Extensive diffuse liver metastases 2.2 cm, 4.3 cm.  Hepatomegaly, bone metastases most apparent in thoracic spine, nonspecific lung nodules lower periesophageal lymph nodes suspicious    02/25/2018 Pathology Results    Liver biopsy: Metastatic breast cancer ER 100%, PR 10%, HER-2 negative ratio 1.41     03/10/2018 -  Anti-estrogen oral therapy    Ibrance with letrozole     CHIEF COMPLIANT: Follow-up on CT scan scan for metastatic breast cancer, currently on Ibrance with letrozole, worsening strength  INTERVAL HISTORY: Kimberly Reed is a 62 year old with above-mentioned history of  metastatic breast cancer currently on Ibrance with letrozole.  She had CT scans and is here today to discuss the results.  The CT scans do not show any progression of disease.  She has stable findings.  She is complaining of worsening abdominal distention and also feeling weakness that is marketed to the point that she is needing a wheelchair.  She is also  complaining of profound swelling of her extremities and significant drying of the skin.  REVIEW OF SYSTEMS:   Constitutional: Severe generalized weakness Eyes: Denies blurriness of vision Ears, nose, mouth, throat, and face: Denies mucositis or sore throat Respiratory: Denies cough, dyspnea or wheezes Cardiovascular: Denies palpitation, chest discomfort Gastrointestinal: Abdominal distention, decreased appetite, gagging when she eats food Skin: Denies abnormal skin rashes Lymphatics: Denies new lymphadenopathy or easy bruising Neurological:Denies numbness, tingling or new weaknesses Behavioral/Psych: Mood is stable, no new changes  Extremities: Significant bilateral upper and lower extremity edema   All other systems were reviewed with the patient and are negative.  I have reviewed the past medical history, past surgical history, social history and family history with the patient and they are unchanged from previous note.  ALLERGIES:  is allergic to other; effexor [venlafaxine]; and mucinex [guaifenesin er].  MEDICATIONS:  Current Outpatient Medications  Medication Sig Dispense Refill  . acetaminophen (TYLENOL) 325 MG tablet Take 2 tablets (650 mg total) by mouth every 6 (six) hours as needed for mild pain or moderate pain (or Fever >/= 101). 30 tablet 0  . b complex vitamins tablet Take 1 tablet by mouth daily.    . brimonidine-timolol (COMBIGAN) 0.2-0.5 % ophthalmic solution Place 1 drop into both eyes every 12 (twelve) hours.     . dorzolamide (TRUSOPT) 2 % ophthalmic solution Place 1 drop into both eyes 2 (two) times daily.     . furosemide (LASIX) 20 MG tablet Take 1 tablet (20 mg total) by mouth 2 (two) times daily. 60 tablet 3  . IBRANCE 75 MG capsule TAKE ONE CAPSULE BY MOUTH DAILY WITH FOOD FOR 21 DAYS OF A 28 DAY CYCLE. SWALLOW WHOLE. DO NOT TAKE IF CAPSULES ARE BROKEN OR CRACKED. AVOID 21 capsule 2  . letrozole (FEMARA) 2.5 MG tablet Take 1 tablet (2.5 mg total) by mouth  daily. 90 tablet 3  . levothyroxine (SYNTHROID, LEVOTHROID) 150 MCG tablet TAKE 1 TABLET BY MOUTH EVERY DAY ON EMPTY STOMACH IN THE MORNING  0  . Omega-3 Fatty Acids (FISH OIL) 1000 MG CAPS Take 1,000 mg by mouth daily.    [name] OVER THE COUNTER MEDICATION [place] supplement    . OVER THE COUNTER MEDICATION Tumeric    . Probiotic Product (PROBIOTIC PO) Take by mouth.    . SELENIUM PO Take by mouth.    . Travoprost, BAK Free, (TRAVATAN) 0.004 % SOLN ophthalmic solution Place 1 drop into both eyes at bedtime.      No current facility-administered medications for this visit.     PHYSICAL EXAMINATION: ECOG PERFORMANCE STATUS: 1 - Symptomatic but completely ambulatory  Vitals:   09/14/18 1457  BP: 121/63  Pulse: 82  Resp: 18  Temp: 98.3 F (36.8 C)  SpO2: 100%   Filed Weights   09/14/18 1457  Weight: 238 lb 12.8 oz (108.3 kg)    GENERAL:alert, no distress and comfortable SKIN: skin color, texture, turgor are normal, no rashes or significant lesions EYES: normal, Conjunctiva are pink and non-injected, sclera clear OROPHARYNX:no exudate, no erythema and lips, buccal mucosa, and tongue normal  NECK: supple, thyroid normal size, non-tender, without nodularity LYMPH:  no palpable lymphadenopathy in the cervical, axillary or inguinal LUNGS: clear to auscultation and percussion with normal breathing effort HEART: regular rate & rhythm and no murmurs and no lower extremity edema ABDOMEN: Mild to moderate abdominal distention with ascites MUSCULOSKELETAL:no cyanosis of digits and no clubbing  NEURO: alert & oriented x 3 with fluent speech, no focal motor/sensory deficits EXTREMITIES: 2+ lower extremity edema   LABORATORY DATA:  I have reviewed the data as listed CMP Latest Ref Rng & Units 09/14/2018 08/03/2018 07/06/2018  Glucose 70 - 99 mg/dL 100(H) 114(H) 110(H)  BUN 8 - 23 mg/dL 26(H) 18 27(H)  Creatinine 0.44 - 1.00 mg/dL 1.80(H) 1.19(H) 1.36(H)  Sodium 135 - 145 mmol/L 142 139  140  Potassium 3.5 - 5.1 mmol/L 3.8 4.1 4.3  Chloride 98 - 111 mmol/L 108 107 108  CO2 22 - 32 mmol/L _0 Calcium 8.9 - 10.3 mg/dL 10.8(H) 10.6(H) 10.1  Total Protein 6.5 - 8.1 g/dL 7.1 7.3 6.9  Total Bilirubin 0.3 - 1.2 mg/dL 2.1(H) 0.9 0.8  Alkaline Phos 38 - 126 U/L 274(H) 253(H) 203(H)  AST 15 - 41 U/L 175(HH) 155(H) 61(H)  ALT 0 - 44 U/L 45(H) 95(H) 32    Lab Results  Component Value Date   WBC 3.3 (L) 09/14/2018   HGB 9.8 (L) 09/14/2018   HCT 28.1 (L) 09/14/2018   MCV 101.4 (H) 09/14/2018   PLT 315 09/14/2018   NEUTROABS 1.8 09/14/2018    ASSESSMENT & PLAN:  Breast cancer of upper-outer quadrant of left female breast ([place]) Metastatic breast cancer in the left breast involving left axillary lymph nodes and isolated L1 vertebral metastases stage IV disease, ER 100 percent, PR 4%, HER-2 negative ratio 1.3 Ki-67 15%  CTCAP4/17/2019: Extensive diffuse hepatic metastases, secondary hepatomegaly, bone metastases in the thoracic spine, nonspecific pulmonary nodules, lower periesophageal lymph nodes Liver biopsy 02/24/2018: Metastatic breast cancer,ER 100%, PR 10%, HER-2 negative ratio 1.41  Current treatment:Ibrance with letrozole started 03/11/2018 Ibrance toxicities: 1.Neutropenia: Today's ANCis. Current dosage of Ibrance: 75 mg I recommended continuation of the current dosing. 2.Fatigue  CT CAP: 05/26/2018 interval increase in the size of innumerable low-attenuation liver metastatic lesions, interval progression of lytic bone metastases, interval increase in the lower paraesophageal adenopathy  Major depression: Patient needs a lot of support and is seeing a therapist. She has limited social support systems. She has met with our chaplain as well as social workers.  Anemia: Macrocytic related to Ibrance. Monitoring it closely.  CT CAP: 09/12/2018: As of metastatic disease in the liver, increasing ascites similar bone metastases Bone scan 09/12/2018: Bone  metastases noted in the skull, thoracic spine, ribs We will get Delton See authorized will be considered give her Xgeva injections every 3 months.  Radiology review: I discussed with her that overall she has stable disease Worsening leg edema and ascites: I prescribed her Lasix Continue with the current treatment of Ibrance with letrozole.  Xgeva for bone metastases will be initiated when she comes back in 1 month.    Orders Placed This Encounter  Procedures  . CBC with Differential (Cancer Center Only)    Standing Status:   Future    Standing Expiration Date:   09/15/2019  . CMP ([place] only)    Standing Status:   Future    Standing Expiration Date:   09/15/2019   The patient has a good understanding of the overall plan. she agrees with it.  she will call with any problems that may develop before the next visit here.   Harriette Ohara, MD 09/14/18

## 2018-09-16 ENCOUNTER — Telehealth: Payer: Self-pay

## 2018-09-16 NOTE — Telephone Encounter (Signed)
Pt called reporting " I am not feeling very well. My abdomen is very distended and feels very bloated. I also still have a lot of swelling around my arms and legs. I have a hard time moving because I just hurt everywhere."   Asked if pt was taking her lasix that Dr.Gudena prescribed from yesterday's appt. Pt states that she started taking lasix last night. Pt states " I still haven't been urinating that much. I have been going more frequently, with little output and no relief of swelling coming down."   Reviewed labs from yesterday with Dr.Magrinat, since Dr.Gudena is out of office today. Per Dr.Magrinat, due to creatinine and calcium level elevated, pt to take only 1 dose of lasix daily, instead of BID. Also encourage pt to drink plenty of fluids to help with flushing out the high calcium levels. Pt is supposed to be scheduled for Xgeva injections, but pending insurance authorization. Will follow up on Monday, to see if Delton See can be set up as soon as possible.   Told pt that we will contact her on Monday to see if there are any updates. Also advised that if pt does not have enough urine output, or has continued generalized edema, she will need to go to the Emergency room right away. Pt states " I am not going to the the ER this weekend, I have a lot to do with moving and I have no time to wait in the ER for hrs. That is what happened to me the last time I went, I ended up with a kidney infection and renal failure. I will not go." Pt immediately said that she can no longer be in conversation with this RN and hung up the phone.

## 2018-09-19 ENCOUNTER — Inpatient Hospital Stay (HOSPITAL_BASED_OUTPATIENT_CLINIC_OR_DEPARTMENT_OTHER): Payer: Medicare Other | Admitting: Medical

## 2018-09-19 ENCOUNTER — Inpatient Hospital Stay: Payer: Medicare Other

## 2018-09-19 ENCOUNTER — Telehealth: Payer: Self-pay

## 2018-09-19 ENCOUNTER — Other Ambulatory Visit: Payer: Self-pay

## 2018-09-19 VITALS — BP 118/59 | HR 70 | Resp 19 | Ht 65.0 in | Wt 240.7 lb

## 2018-09-19 DIAGNOSIS — E8809 Other disorders of plasma-protein metabolism, not elsewhere classified: Secondary | ICD-10-CM | POA: Diagnosis not present

## 2018-09-19 DIAGNOSIS — R601 Generalized edema: Secondary | ICD-10-CM

## 2018-09-19 DIAGNOSIS — E039 Hypothyroidism, unspecified: Secondary | ICD-10-CM

## 2018-09-19 DIAGNOSIS — F329 Major depressive disorder, single episode, unspecified: Secondary | ICD-10-CM

## 2018-09-19 DIAGNOSIS — R188 Other ascites: Secondary | ICD-10-CM

## 2018-09-19 DIAGNOSIS — C50412 Malignant neoplasm of upper-outer quadrant of left female breast: Secondary | ICD-10-CM

## 2018-09-19 DIAGNOSIS — Z170 Estrogen receptor positive status [ER+]: Secondary | ICD-10-CM

## 2018-09-19 DIAGNOSIS — Z923 Personal history of irradiation: Secondary | ICD-10-CM

## 2018-09-19 DIAGNOSIS — Z79811 Long term (current) use of aromatase inhibitors: Secondary | ICD-10-CM

## 2018-09-19 LAB — CMP (CANCER CENTER ONLY)
ALBUMIN: 2.9 g/dL — AB (ref 3.5–5.0)
ALK PHOS: 254 U/L — AB (ref 38–126)
ALT: 34 U/L (ref 0–44)
AST: 127 U/L — AB (ref 15–41)
Anion gap: 12 (ref 5–15)
BILIRUBIN TOTAL: 2.3 mg/dL — AB (ref 0.3–1.2)
BUN: 25 mg/dL — AB (ref 8–23)
CALCIUM: 10.3 mg/dL (ref 8.9–10.3)
CO2: 22 mmol/L (ref 22–32)
Chloride: 103 mmol/L (ref 98–111)
Creatinine: 1.63 mg/dL — ABNORMAL HIGH (ref 0.44–1.00)
GFR, Est AFR Am: 37 mL/min — ABNORMAL LOW (ref >60)
GFR, Estimated: 32 mL/min — ABNORMAL LOW (ref >60)
GLUCOSE: 93 mg/dL (ref 70–99)
Potassium: 3.8 mmol/L (ref 3.5–5.1)
Sodium: 137 mmol/L (ref 135–145)
TOTAL PROTEIN: 6.6 g/dL (ref 6.5–8.1)

## 2018-09-19 LAB — URINALYSIS, COMPLETE (UACMP) WITH MICROSCOPIC
BACTERIA UA: NONE SEEN
Bilirubin Urine: NEGATIVE
Glucose, UA: NEGATIVE mg/dL
HGB URINE DIPSTICK: NEGATIVE
KETONES UR: NEGATIVE mg/dL
LEUKOCYTES UA: NEGATIVE
NITRITE: NEGATIVE
PH: 5 (ref 5.0–8.0)
PROTEIN: NEGATIVE mg/dL
Specific Gravity, Urine: 1.008 (ref 1.005–1.030)

## 2018-09-19 LAB — CBC WITH DIFFERENTIAL (CANCER CENTER ONLY)
ABS IMMATURE GRANULOCYTES: 0.01 10*3/uL (ref 0.00–0.07)
BASOS ABS: 0 10*3/uL (ref 0.0–0.1)
BASOS PCT: 1 %
EOS ABS: 0 10*3/uL (ref 0.0–0.5)
Eosinophils Relative: 1 %
HEMATOCRIT: 26.1 % — AB (ref 36.0–46.0)
Hemoglobin: 9.2 g/dL — ABNORMAL LOW (ref 12.0–15.0)
IMMATURE GRANULOCYTES: 0 %
Lymphocytes Relative: 37 %
Lymphs Abs: 1.2 10*3/uL (ref 0.7–4.0)
MCH: 35.2 pg — ABNORMAL HIGH (ref 26.0–34.0)
MCHC: 35.2 g/dL (ref 30.0–36.0)
MCV: 100 fL (ref 80.0–100.0)
MONOS PCT: 9 %
Monocytes Absolute: 0.3 10*3/uL (ref 0.1–1.0)
NEUTROS ABS: 1.7 10*3/uL (ref 1.7–7.7)
NEUTROS PCT: 52 %
NRBC: 0 % (ref 0.0–0.2)
Platelet Count: 191 10*3/uL (ref 150–400)
RBC: 2.61 MIL/uL — AB (ref 3.87–5.11)
RDW: 16.7 % — ABNORMAL HIGH (ref 11.5–15.5)
WBC: 3.2 10*3/uL — AB (ref 4.0–10.5)

## 2018-09-19 NOTE — Patient Instructions (Signed)
Paracentesis °Paracentesis is a procedure to remove excess fluid (ascites) from the belly (abdomen). Ascites can result from certain conditions, such as infection, inflammation, abdominal injury, heart failure, chronic scarring of the liver (cirrhosis), or cancer. Ascites is removed using a needle that is inserted through the skin and tissue into the abdomen. °This procedure may be done: °· To determine the cause of the ascites. °· To relieve symptoms that are caused by the ascites, such as pain or shortness of breath. °· To see if there is bleeding after an abdominal injury. ° °Tell a health care provider about: °· Any allergies you have. °· All medicines you are taking, including vitamins, herbs, eye drops, creams, and over-the-counter medicines. °· Any problems you or family members have had with anesthetic medicines. °· Any blood disorders you have. °· Any surgeries you have had. °· Any medical conditions you have. °· Whether you are pregnant or may be pregnant. °What are the risks? °Generally, this is a safe procedure. However, problems may occur, including: °· Infection. °· Bleeding. °· Injury to an abdominal organ, such as the bowel (large intestine), liver, spleen, or bladder. °· Low blood pressure (hypotension). °· Spreading of cancer, if there are cancer cells in the abdominal fluid. °· Mental status changes in people who have liver disease. These changes would be caused by shifts in the balance of fluids and minerals (electrolytes) in the body. ° °What happens before the procedure? °· Ask your health care provider about: °? Changing or stopping your regular medicines. This is especially important if you are taking diabetes medicines or blood thinners. °? Taking medicines such as aspirin and ibuprofen. These medicines can thin your blood. Do not take these medicines before your procedure if your health care provider instructs you not to. °· A blood sample may be done to determine your blood clotting  time. °· You will be asked to urinate. °What happens during the procedure? °· You may be asked to lie on your back with your head raised (elevated). °· To reduce your risk of infection: °? Your health care team will wash or sanitize their hands. °? Your skin will be washed with soap. °· You will be given a medicine to numb the area (local anesthetic). °· Your abdominal skin will be punctured with a needle or a scalpel. °· A drainage tube will be inserted through the puncture site. Fluid will drain through the tube into a container. °· After enough fluid has been removed, the tube will be removed. °· A sample of the fluid will be sent for examination. °· A bandage (dressing) will be placed over the puncture site. °The procedure may vary among health care providers and hospitals. °What happens after the procedure? °· It is your responsibility to get your test results. Ask your health care provider or the department performing the test when your results will be ready. °This information is not intended to replace advice given to you by your health care provider. Make sure you discuss any questions you have with your health care provider. °Document Released: 04/27/2005 Document Revised: 03/19/2016 Document Reviewed: 12/25/2014 °Elsevier Interactive Patient Education © 2018 [employer] ° °

## 2018-09-19 NOTE — Progress Notes (Signed)
Pt presents today with oliguria for the last week despite taking in adequate fluids, states that she has a hx of UTIs and renal failure.  Recent wt gain of 10 lbs and bilat lower leg swelling w/out pitting or pain or drainage or redness.  Hx lymphedema L arm which is at baseline.  Pt states she has trouble ambulating d/t pain in her stomach.  Pain is described as sharp band of pain across epigastric area along with bloating, feelings of pressure & tenderness to touch.  Pt states that eating and drinking worsens the pain as well, intermittent diarrhea and constipation that are fixed with laxatives/anti-diarrheal medications. Reports fatigue and gen weakness.

## 2018-09-19 NOTE — Telephone Encounter (Signed)
Pt calling to report that she has not been having much luck with urine output over the weekend. She states " I have felt some mild improvement with my swelling around my legs and arms, but I can't walk, my abdomen feels full, and I have been drinking plenty of fluids with very little output, despite taking lasix."   Pt states that she was feeling similar symptoms when she ended up with renal failure, beginning of this year. Pt was hospitalized for a few days with IV antibiotics then.   Discussed with Dr.Gudena and agreed to have pt come over today and labs/SM appt. Pt currently on Ibrance and letrozole, which may contribute to some generalized weakness that pt may be experiencing, on top of her edema.  Appt set and confirmed time/date with pt for this afternoon. Notified symptom management RN and is aware.

## 2018-09-20 ENCOUNTER — Ambulatory Visit (HOSPITAL_COMMUNITY)
Admission: RE | Admit: 2018-09-20 | Discharge: 2018-09-20 | Disposition: A | Discharge status: Home / Self Care | Payer: Medicare Other | Source: Ambulatory Visit | Attending: Medical | Admitting: Medical

## 2018-09-20 DIAGNOSIS — R188 Other ascites: Secondary | ICD-10-CM | POA: Diagnosis not present

## 2018-09-20 MED ORDER — LIDOCAINE HCL 1 % IJ SOLN
INTRAMUSCULAR | Status: AC
  Filled 2018-09-20: qty 20

## 2018-09-20 NOTE — Procedures (Signed)
Ultrasound-guided diagnostic and therapeutic paracentesis performed yielding 3.7  liters of amber colored fluid. No immediate complications. A portion of the fluid was sent to the lab for cytology. EBL< 1cc.

## 2018-09-21 ENCOUNTER — Telehealth: Payer: Self-pay

## 2018-09-21 ENCOUNTER — Telehealth: Payer: Self-pay | Admitting: Hematology and Oncology

## 2018-09-21 LAB — URINE CULTURE

## 2018-09-21 NOTE — Progress Notes (Signed)
These preliminary result these preliminary results were noted.  Awaiting final report.

## 2018-09-21 NOTE — Progress Notes (Signed)
[facility] Progress Note   Kimberly Reed [id number] 1956-03-05 62 y.o.  Kimberly Reed is managed by Dr. Nicholas Lose  Actively treated with chemotherapy/immunotherapy/hormonal therapy: Yes  Current Therapy: Ibrance & Letrozole  Last Treated: N/A  Assessment: Plan:    Other ascites - Plan: US Paracentesis  Hypoalbuminemia  Malignant neoplasm of upper-outer quadrant of left breast in female, estrogen receptor positive ([place])   1) Abdominal ascites: The patient was scheduled for a paracentesis tomorrow.  2) Hypoalbuminemia: Discussed increasing intake of foods high in protein.  3) Metastatic estrogen receptor positive breast cancer:  The pt is managed by Dr. Lindi Adie and continues to be treated with Ibrance and Letrozole.  She will see Dr. Lindi Adie for a follow up on 10/14/18.  Please see After Visit Summary for patient specific instructions.  Future Appointments  Date Time Provider [place]  10/14/2018 11:00 AM [facility] 1 [facility] None  10/14/2018 11:30 AM Nicholas Lose, MD [facility] None  12/19/2018  2:15 PM Dennie Bible, NP [facility] None    Orders Placed This Encounter  Procedures  . US Paracentesis       Subjective:   Patient ID:  Kimberly Reed is a 62 y.o. (DOB 1956-08-02) female.  Chief Complaint:  Chief Complaint  Patient presents with  . Urinary Retention    HPI Kimberly Reed is a 62 y.o. Female with metastiatic estrogen receptor positive breast cancer who is managed by Dr. Lindi Adie and is treated with [name] and Maylon Peppers.  She presents to the clinic today with minimal urine output over the last few days despite reported adequate fluids intake.  Pt has a recent history of UTIs and renal failure.  Pt also presents with bilat lower leg trace edema without pitting.  Hx of lymphedema in left arm which is swollen at baseline today.  Pt reports fatigue and generalized weakness, as well as stress related to moving into a new  home.  Pt is experiencing abdominal bloating with a band of cramping, sharp pain across the upper two quadrants/epigastrum area that worsens with eating, drinking, and activity.  States that due to this pain she has trouble ambulating at times.  Also reports intermittent constipation and diarrhea that are managed with OTC medications at home.  Medications: I have reviewed the patient's current medications.  Allergies:  Allergies  Allergen Reactions  . Other Nausea Only and Other (See Comments)    Antibiotics - Pt cannot identify which antibiotics she reacts to.  Make her nausous and gives her headache.  . Effexor [Venlafaxine] Nausea And Vomiting  . Mucinex [Guaifenesin Er] Nausea Only    Past Medical History:  Diagnosis Date  . Anemia   . Anxiety   . Atypical chest pain   . Breast cancer ([place]) 05/2014   left. completed chemo 10/2014. Did not tolerate anti-estrogen.   . Cancer ([place])    left breast dx. 7'65- chemo planned to start 07-09-14- Dr. Lindi Adie, [facility]  . Depression   . Family history of heart disease   . Glaucoma   . Gluten intolerance    Dr. Collene Mares  . Headache syndrome 10/13/2016  . History of syncope 2008   loss of bladder control eval by Neuro  . Hot flashes   . Hyperlipemia   . Hypothyroid   . Insomnia   . Lymphedema    L arm  . Migraine without aura   . Personal history of chemotherapy   . Personal history of radiation therapy   .  Shingles 2018  . Wears glasses     Past Surgical History:  Procedure Laterality Date  . BREAST BIOPSY Left 06/07/2014   malignant  . BREAST BIOPSY Left 05/24/2014   malignant  . BREAST LUMPECTOMY Left 01/30/2015   malignant  . BREAST LUMPECTOMY WITH NEEDLE LOCALIZATION AND AXILLARY LYMPH NODE DISSECTION Left 01/30/2015   Procedure: BREAST LUMPECTOMY WITH NEEDLE LOCALIZATION LEFT AXILLARY DISSECTION REMOVAL OF PORT;  Surgeon: [name], MD;  Location: [facility];  Service: General;  Laterality:  Left;  . CATARACT EXTRACTION Left   . DILATION AND CURETTAGE OF UTERUS    . ESOPHAGOGASTRODUODENOSCOPY  06/01/14  . EXAMINATION UNDER ANESTHESIA  02/24/2013   Procedure: EXAM UNDER ANESTHESIA;  Surgeon: Azalia Bilis, MD;  Location: [facility];  Service: Gynecology;;  with removal of prolapsing cervical mass; pap smear and endometrial biopsy  . GLAUCOMA SURGERY Left    x1   . PORTACATH PLACEMENT Right 07/06/2014   Procedure: PORT A CATH  PLACEMENT;  Surgeon: [name], MD;  Location: [facility];  Service: General;  Laterality: Right;  . TONSILLECTOMY     child  . uterine polyp removed     per hysteroscopy about 1 1/2 yrs ago.    Family History  Problem Relation Age of Onset  . Kidney disease Mother   . Depression Mother   . Early death Mother   . Hypertension Mother   . Heart attack Mother   . Aortic aneurysm Mother   . Hypertension Father   . Dementia Father   . Depression Father   . Kidney disease Brother   . Hypertension Sister   . Heart disease Unknown        "all of moms side"  . Depression Unknown   . Heart failure Cousin     Social History   Socioeconomic History  . Marital status: Single    Spouse name: Not on file  . Number of children: 0  . Years of education: Some college  . Highest education level: Not on file  Occupational History  . 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 Financial resource strain: Not on file  . Food insecurity:    Worry: Not on file    Inability: Not on file  . Transportation needs:    Medical: Not on file    Non-medical: Not on file  Tobacco Use  . Smoking status: Never Smoker  . Smokeless tobacco: Never Used  Substance and Sexual Activity  . Alcohol use: No  . Drug use: No    Comment: in 20's but not now  . Sexual activity: Not Currently    Comment: menarche age 58, P 0, no HRT  Lifestyle  . Physical activity:    Days per week: Not on file    Minutes  per session: Not on file  . Stress: Not on file  Relationships  . Social connections:    Talks on phone: Not on file    Gets together: Not on file    Attends religious service: Not on file    Active member of club or organization: Not on file    Attends meetings of clubs or organizations: Not on file    Relationship status: Not on file  . Intimate partner violence:    Fear of current or ex partner: Not on file    Emotionally abused: Not on file    Physically  abused: Not on file    Forced sexual activity: Not on file  Other Topics Concern  . Not on file  Social History Narrative   Single.  College.  Lives alone.  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   Right-handed   Dairy products, takes a daily vitamin, drinks caffeine, uses herbal remedies.   Alarm at home.  Wears her seatbelt.       Past Medical History, Surgical history, Social history, and Family history were reviewed and updated as appropriate.   Please see review of systems for further details on the patient's review from today.   Review of Systems:  Review of Systems  Constitutional: Positive for fatigue. Negative for activity change, appetite change, chills, diaphoresis and fever.  HENT: Negative for trouble swallowing.   Respiratory: Negative for cough, chest tightness and shortness of breath.   Cardiovascular: Positive for leg swelling. Negative for chest pain and palpitations.  Gastrointestinal: Positive for abdominal distention, abdominal pain, constipation and diarrhea. Negative for nausea and vomiting.  Neurological: Positive for weakness.  Psychiatric/Behavioral: The patient is nervous/anxious.     Objective:   Physical Exam:  BP (!) 118/59 (BP Location: Left Arm, Patient Position: Sitting)   Pulse 70   Resp 19   Ht 5\' 5"  (1.651 m)   Wt 240 lb 11.2 oz (109.2 kg)   LMP 02/15/2013   SpO2 100%   BMI 40.05 kg/m  ECOG: 0  Physical Exam  Constitutional: No distress.  HENT:    Head: Normocephalic and atraumatic.  Mouth/Throat: No oropharyngeal exudate.  Neck: Normal range of motion. Neck supple.  Cardiovascular: Normal rate, regular rhythm and normal heart sounds. Exam reveals no gallop and no friction rub.  No murmur heard. Pulmonary/Chest: Effort normal and breath sounds normal. No respiratory distress. She has no wheezes. She has no rales.  Abdominal: Soft. Bowel sounds are normal. She exhibits distension. She exhibits no mass. There is tenderness. There is no rebound and no guarding.  Musculoskeletal: She exhibits edema (Trace bilateral lower extremity edema.).  Lymphadenopathy:    She has no cervical adenopathy.  Neurological: She is alert. Coordination normal.  Skin: Skin is warm and dry. No rash noted. She is not diaphoretic. No erythema.    Lab Review:     Component Value Date/Time   NA 137 09/19/2018 1327   NA 141 08/10/2017   NA 141 01/10/2016 1539   K 3.8 09/19/2018 1327   K 3.8 01/10/2016 1539   CL 103 09/19/2018 1327   CO2 22 09/19/2018 1327   CO2 29 01/10/2016 1539   GLUCOSE 93 09/19/2018 1327   GLUCOSE 115 01/10/2016 1539   BUN 25 (H) 09/19/2018 1327   BUN 18 08/10/2017   BUN 18.1 01/10/2016 1539   CREATININE 1.63 (H) 09/19/2018 1327   CREATININE 1.0 01/10/2016 1539   CALCIUM 10.3 09/19/2018 1327   CALCIUM 10.1 01/10/2016 1539   PROT 6.6 09/19/2018 1327   PROT 7.4 01/10/2016 1539   ALBUMIN 2.9 (L) 09/19/2018 1327   ALBUMIN 4.1 01/10/2016 1539   AST 127 (H) 09/19/2018 1327   AST 15 01/10/2016 1539   ALT 34 09/19/2018 1327   ALT 17 01/10/2016 1539   ALKPHOS 254 (H) 09/19/2018 1327   ALKPHOS 92 01/10/2016 1539   BILITOT 2.3 (H) 09/19/2018 1327   BILITOT 0.50 01/10/2016 1539   GFRNONAA 32 (L) 09/19/2018 1327   GFRAA 37 (L) 09/19/2018 1327       Component Value Date/Time  WBC 3.2 (L) 09/19/2018 1327   WBC 9.8 02/24/2018 1145   RBC 2.61 (L) 09/19/2018 1327   HGB 9.2 (L) 09/19/2018 1327   HGB 12.9 01/10/2016 1538   HCT  26.1 (L) 09/19/2018 1327   HCT 38.9 01/10/2016 1538   PLT 191 09/19/2018 1327   PLT 285 01/10/2016 1538   MCV 100.0 09/19/2018 1327   MCV 90.9 01/10/2016 1538   MCH 35.2 (H) 09/19/2018 1327   MCHC 35.2 09/19/2018 1327   RDW 16.7 (H) 09/19/2018 1327   RDW 15.1 (H) 01/10/2016 1538   LYMPHSABS 1.2 09/19/2018 1327   LYMPHSABS 1.7 01/10/2016 1538   MONOABS 0.3 09/19/2018 1327   MONOABS 0.5 01/10/2016 1538   EOSABS 0.0 09/19/2018 1327   EOSABS 0.1 01/10/2016 1538   BASOSABS 0.0 09/19/2018 1327   BASOSABS 0.0 01/10/2016 1538   -------------------------------  Imaging from last 24 hours (if applicable):  Radiology interpretation: Ct Chest W Contrast  Result Date: 09/12/2018 CLINICAL DATA:  62 year old female with history of left-sided breast cancer (stage IV). Evaluate for recurrent or metastatic disease. EXAM: CT CHEST, ABDOMEN, AND PELVIS WITH CONTRAST TECHNIQUE: Multidetector CT imaging of the chest, abdomen and pelvis was performed following the standard protocol during bolus administration of intravenous contrast. CONTRAST:  142mL OMNIPAQUE IOHEXOL 300 MG/ML  SOLN COMPARISON:  CT the chest, abdomen and pelvis 05/26/2018. FINDINGS: CT CHEST FINDINGS Cardiovascular: Heart size is normal. There is no significant pericardial fluid, thickening or pericardial calcification. No atherosclerotic calcifications are noted in the thoracic aorta or the coronary arteries. Mediastinum/Nodes: No pathologically enlarged mediastinal or hilar lymph nodes. Esophagus is unremarkable in appearance. No axillary lymphadenopathy. Surgical clips in the left axillary region from prior lymph node dissection. Lungs/Pleura: A few scattered 1-2 mm pulmonary nodules are noted throughout the lungs bilaterally, stable compared to the prior study, nonspecific. No larger more suspicious appearing pulmonary nodules or masses are noted. No acute consolidative airspace disease. No pleural effusions. Musculoskeletal: Postoperative  changes of lumpectomy in the subareolar aspect of the left breast. Innumerable mixed lytic and sclerotic lesions are again noted throughout the axial and appendicular skeleton, indicative of widespread metastatic disease to the bones. Findings appear unchanged. Soft tissue edema throughout the visualized portions of the left upper extremity, potentially from lymphedema, similar to the prior examination. CT ABDOMEN PELVIS FINDINGS Hepatobiliary: Innumerable hypovascular lesions are again noted throughout the liver, generally similar in size and number to the prior examination. An example of this is a lesion in segment 2 of the liver (axial image 54 of series 2) which currently measures 4.9 x 4.3 cm (previously 5.1 x 4.2 cm on 05/26/2018). No intra or extrahepatic biliary ductal dilatation. Intermediate attenuation material filling the gallbladder which likely reflects biliary sludge. Gallbladder is nearly completely decompressed, without definite wall thickening or surrounding inflammatory changes. Liver has a shrunken appearance and nodular contour, indicative of developing cirrhosis or pseudocirrhosis. Pancreas: No pancreatic mass. No pancreatic ductal dilatation. No pancreatic or peripancreatic fluid or inflammatory changes. Spleen: Unremarkable. Adrenals/Urinary Tract: Bilateral kidneys and bilateral adrenal glands are normal in appearance. No hydroureteronephrosis. Urinary bladder is nearly decompressed, but otherwise unremarkable in appearance. Stomach/Bowel: Normal appearance of the stomach. No pathologic dilatation of small bowel or colon. Appendix is not confidently identified and may be surgically absent. Vascular/Lymphatic: No significant atherosclerotic disease, aneurysm or dissection noted in the abdominal or pelvic vasculature. Multiple prominent borderline enlarged and mildly enlarged retroperitoneal lymph nodes are noted, measuring up to 11 mm in short axis in the left  para-aortic nodal station (axial  image 71 of series 2). Reproductive: Uterus and ovaries are unremarkable in appearance. Other: Moderate volume of ascites.  No pneumoperitoneum. Musculoskeletal: Numerous mixed lytic and sclerotic lesions are again noted throughout the visualized axial and appendicular skeleton, indicative of widespread metastatic disease to the bones. IMPRESSION: 1. Overall, today's study demonstrates similar appearance of metastatic disease throughout the liver. There is increasing moderate volume of ascites which may be malignant. 2. Widespread metastatic disease to the bones, similar to the prior examination. 3. Additional incidental findings, similar to the prior examination, as above. Electronically Signed   By: Vinnie Langton M.D.   On: 09/12/2018 15:43   Nm Bone Scan Whole Body  Result Date: 09/13/2018 CLINICAL DATA:  History of left breast cancer. Generalized body pain and stiffness. EXAM: NUCLEAR MEDICINE WHOLE BODY BONE SCAN TECHNIQUE: Whole body anterior and posterior images were obtained approximately 3 hours after intravenous injection of radiopharmaceutical. RADIOPHARMACEUTICALS:  19.6 mCi Technetium-84m MDP IV COMPARISON:  PET-CT 12/25/2014. FINDINGS: Bilateral renal function excretion. Punctate areas of increased activity noted over the skull, thoracic spine, and right ribs. Skull series, thoracic spine series, right rib series suggested for further evaluation as metastatic disease cannot be excluded. Minimal increased activity noted about both shoulders and knees most likely degenerative. IMPRESSION: Punctate areas of increased activity noted over the skull, thoracic spine, and right ribs. Skull series, thoracic spine series, right rib series suggested for further evaluation as metastatic disease cannot be excluded. Electronically Signed   By: Marcello Moores  Register   On: 09/13/2018 08:37   Ct Abdomen Pelvis W Contrast  Result Date: 09/12/2018 CLINICAL DATA:  62 year old female with history of left-sided  breast cancer (stage IV). Evaluate for recurrent or metastatic disease. EXAM: CT CHEST, ABDOMEN, AND PELVIS WITH CONTRAST TECHNIQUE: Multidetector CT imaging of the chest, abdomen and pelvis was performed following the standard protocol during bolus administration of intravenous contrast. CONTRAST:  129mL OMNIPAQUE IOHEXOL 300 MG/ML  SOLN COMPARISON:  CT the chest, abdomen and pelvis 05/26/2018. FINDINGS: CT CHEST FINDINGS Cardiovascular: Heart size is normal. There is no significant pericardial fluid, thickening or pericardial calcification. No atherosclerotic calcifications are noted in the thoracic aorta or the coronary arteries. Mediastinum/Nodes: No pathologically enlarged mediastinal or hilar lymph nodes. Esophagus is unremarkable in appearance. No axillary lymphadenopathy. Surgical clips in the left axillary region from prior lymph node dissection. Lungs/Pleura: A few scattered 1-2 mm pulmonary nodules are noted throughout the lungs bilaterally, stable compared to the prior study, nonspecific. No larger more suspicious appearing pulmonary nodules or masses are noted. No acute consolidative airspace disease. No pleural effusions. Musculoskeletal: Postoperative changes of lumpectomy in the subareolar aspect of the left breast. Innumerable mixed lytic and sclerotic lesions are again noted throughout the axial and appendicular skeleton, indicative of widespread metastatic disease to the bones. Findings appear unchanged. Soft tissue edema throughout the visualized portions of the left upper extremity, potentially from lymphedema, similar to the prior examination. CT ABDOMEN PELVIS FINDINGS Hepatobiliary: Innumerable hypovascular lesions are again noted throughout the liver, generally similar in size and number to the prior examination. An example of this is a lesion in segment 2 of the liver (axial image 54 of series 2) which currently measures 4.9 x 4.3 cm (previously 5.1 x 4.2 cm on 05/26/2018). No intra or  extrahepatic biliary ductal dilatation. Intermediate attenuation material filling the gallbladder which likely reflects biliary sludge. Gallbladder is nearly completely decompressed, without definite wall thickening or surrounding inflammatory changes. Liver has a shrunken appearance  and nodular contour, indicative of developing cirrhosis or pseudocirrhosis. Pancreas: No pancreatic mass. No pancreatic ductal dilatation. No pancreatic or peripancreatic fluid or inflammatory changes. Spleen: Unremarkable. Adrenals/Urinary Tract: Bilateral kidneys and bilateral adrenal glands are normal in appearance. No hydroureteronephrosis. Urinary bladder is nearly decompressed, but otherwise unremarkable in appearance. Stomach/Bowel: Normal appearance of the stomach. No pathologic dilatation of small bowel or colon. Appendix is not confidently identified and may be surgically absent. Vascular/Lymphatic: No significant atherosclerotic disease, aneurysm or dissection noted in the abdominal or pelvic vasculature. Multiple prominent borderline enlarged and mildly enlarged retroperitoneal lymph nodes are noted, measuring up to 11 mm in short axis in the left para-aortic nodal station (axial image 71 of series 2). Reproductive: Uterus and ovaries are unremarkable in appearance. Other: Moderate volume of ascites.  No pneumoperitoneum. Musculoskeletal: Numerous mixed lytic and sclerotic lesions are again noted throughout the visualized axial and appendicular skeleton, indicative of widespread metastatic disease to the bones. IMPRESSION: 1. Overall, today's study demonstrates similar appearance of metastatic disease throughout the liver. There is increasing moderate volume of ascites which may be malignant. 2. Widespread metastatic disease to the bones, similar to the prior examination. 3. Additional incidental findings, similar to the prior examination, as above. Electronically Signed   By: Vinnie Langton M.D.   On: 09/12/2018 15:43    US Paracentesis  Result Date: 09/20/2018 INDICATION: Patient with history of stage IV breast cancer, ascites. Request made for diagnostic and therapeutic paracentesis. EXAM: ULTRASOUND GUIDED DIAGNOSTIC AND THERAPEUTIC PARACENTESIS MEDICATIONS: None COMPLICATIONS: None immediate. PROCEDURE: Informed written consent was obtained from the patient after a discussion of the risks, benefits and alternatives to treatment. A timeout was performed prior to the initiation of the procedure. Initial ultrasound scanning demonstrates a moderate amount of ascites within the right mid to lower abdominal quadrant. The right mid to lower abdomen was prepped and draped in the usual sterile fashion. 1% lidocaine was used for local anesthesia. Following this, a 19 gauge, 10-cm, Yueh catheter was introduced. An ultrasound image was saved for documentation purposes. The paracentesis was performed. The catheter was removed and a dressing was applied. The patient tolerated the procedure well without immediate post procedural complication. FINDINGS: A total of approximately 3.7 liters of amber fluid was removed. Samples were sent to the laboratory as requested by the clinical team. IMPRESSION: Successful ultrasound-guided therapeutic paracentesis yielding 3.7 liters of peritoneal fluid. Read by: Rowe Robert, PA-C Electronically Signed   By: Marybelle Killings M.D.   On: 09/20/2018 15:28        This case was discussed with Dr. Lindi Adie. He expresses agreement with my management of this patient.

## 2018-09-21 NOTE — Telephone Encounter (Signed)
Pt called in to report that she is still feeling very weak and mildly dizzy this morning, after paracentesis yesterday. Told pt to drink plenty of fluids to replace fluids and to stand up very slowly when ambulating. Pt also encouraged to eat 6 small meals throughout the day. Pt was given some ensure to help increase her protein level as well. Pt wanted to know if she will be feeling weak for a while. Told pt that she may still be feeling very weak from her procedure and rest with proper nutrition will help gain her energy level back. Pt verbalized understanding.  Pt also mentioned that she received a phone call from her insurance company, notifying her of xgeva approval. She would like to schedule this next week. Will send message to scheduling for an appt.

## 2018-09-21 NOTE — Telephone Encounter (Signed)
Tried to call patient regarding 12/4  but she kept saying she has another call

## 2018-09-23 ENCOUNTER — Telehealth: Payer: Self-pay | Admitting: *Deleted

## 2018-09-23 ENCOUNTER — Telehealth: Payer: Self-pay | Admitting: Hematology and Oncology

## 2018-09-23 NOTE — Telephone Encounter (Signed)
Called regarding 1/24 °

## 2018-09-23 NOTE — Telephone Encounter (Signed)
TC from patient stating that she is having some 'issues'. On questioning, pt states she is having some bloating and fluid retention. Pt said she had 'fluid drained from my stomach'  earlier in the week. Reviewed pt's chart-she had a paracentesis done 11/27 for 3.7 liters of fluid. Pt states she is having some dizzy spells and having trouble walking. On further questioning, she states her right leg is weak and has trouble ambulating. She states that she was told to push fluids by mouth and to increase her protein in her diet. Pt states she isdoing that but that she stays in the bed mostly. Asked pt if she wanted to come in to be seen today, she declined-she states she has no one to drive her-that she has limited support. She said she needed to drive somewhere to pay her rent etc. Advised pt that it doesn't sound safw for her to drive if she can't get out of her house easily and is dizzy. Suggested that pt may need to go to ED, that her BP may be low. Pt does not have BP cuff at home. Pt stated she does not want to go to ED, she really does not feel that bad. Advised pt that if the dizzyness worsened, experiences increased weakness, incontinence or any other symptom that she should go to ED. Pt has numerous hepatic mets,  bony mets in several areas including thoracic spine.  Pt voiced understanding and that she would continue to push fluids, eat protein rich foods and find a way to pay her rent without having to drive.  She voiced understanding to call back if anything changes in how she feels or to go to ED. Pt is scheduled for labs/injection on Wednesday, 09/28/18 @ 12:30. She is aware of this. Pt declined any interventions at this time.

## 2018-09-26 ENCOUNTER — Other Ambulatory Visit: Payer: Self-pay

## 2018-09-26 ENCOUNTER — Inpatient Hospital Stay: Payer: Medicare Other | Attending: Hematology and Oncology

## 2018-09-26 ENCOUNTER — Inpatient Hospital Stay (HOSPITAL_BASED_OUTPATIENT_CLINIC_OR_DEPARTMENT_OTHER): Payer: Medicare Other | Admitting: Medical

## 2018-09-26 ENCOUNTER — Telehealth: Payer: Self-pay

## 2018-09-26 VITALS — BP 114/80 | HR 95 | Temp 98.0°F | Resp 18

## 2018-09-26 DIAGNOSIS — R180 Malignant ascites: Secondary | ICD-10-CM | POA: Diagnosis not present

## 2018-09-26 DIAGNOSIS — C50412 Malignant neoplasm of upper-outer quadrant of left female breast: Secondary | ICD-10-CM

## 2018-09-26 DIAGNOSIS — C787 Secondary malignant neoplasm of liver and intrahepatic bile duct: Secondary | ICD-10-CM

## 2018-09-26 DIAGNOSIS — C7951 Secondary malignant neoplasm of bone: Secondary | ICD-10-CM | POA: Insufficient documentation

## 2018-09-26 DIAGNOSIS — F419 Anxiety disorder, unspecified: Secondary | ICD-10-CM | POA: Diagnosis not present

## 2018-09-26 DIAGNOSIS — I890 Lymphedema, not elsewhere classified: Secondary | ICD-10-CM | POA: Diagnosis not present

## 2018-09-26 DIAGNOSIS — F329 Major depressive disorder, single episode, unspecified: Secondary | ICD-10-CM | POA: Diagnosis not present

## 2018-09-26 DIAGNOSIS — R531 Weakness: Secondary | ICD-10-CM

## 2018-09-26 DIAGNOSIS — N179 Acute kidney failure, unspecified: Secondary | ICD-10-CM | POA: Insufficient documentation

## 2018-09-26 DIAGNOSIS — R42 Dizziness and giddiness: Secondary | ICD-10-CM

## 2018-09-26 DIAGNOSIS — E039 Hypothyroidism, unspecified: Secondary | ICD-10-CM | POA: Diagnosis not present

## 2018-09-26 DIAGNOSIS — R112 Nausea with vomiting, unspecified: Secondary | ICD-10-CM | POA: Diagnosis not present

## 2018-09-26 DIAGNOSIS — Z170 Estrogen receptor positive status [ER+]: Principal | ICD-10-CM

## 2018-09-26 DIAGNOSIS — R1084 Generalized abdominal pain: Secondary | ICD-10-CM | POA: Insufficient documentation

## 2018-09-26 DIAGNOSIS — R7989 Other specified abnormal findings of blood chemistry: Secondary | ICD-10-CM

## 2018-09-26 DIAGNOSIS — R945 Abnormal results of liver function studies: Secondary | ICD-10-CM

## 2018-09-26 DIAGNOSIS — L299 Pruritus, unspecified: Secondary | ICD-10-CM | POA: Diagnosis not present

## 2018-09-26 LAB — CMP (CANCER CENTER ONLY)
ALBUMIN: 2.9 g/dL — AB (ref 3.5–5.0)
ALT: 49 U/L — AB (ref 0–44)
AST: 173 U/L — AB (ref 15–41)
Alkaline Phosphatase: 293 U/L — ABNORMAL HIGH (ref 38–126)
Anion gap: 12 (ref 5–15)
BUN: 22 mg/dL (ref 8–23)
CHLORIDE: 103 mmol/L (ref 98–111)
CO2: 22 mmol/L (ref 22–32)
Calcium: 10.3 mg/dL (ref 8.9–10.3)
Creatinine: 1.64 mg/dL — ABNORMAL HIGH (ref 0.44–1.00)
GFR, Est AFR Am: 38 mL/min — ABNORMAL LOW (ref >60)
GFR, Estimated: 33 mL/min — ABNORMAL LOW (ref >60)
GLUCOSE: 107 mg/dL — AB (ref 70–99)
Potassium: 3.7 mmol/L (ref 3.5–5.1)
Sodium: 137 mmol/L (ref 135–145)
Total Bilirubin: 2.8 mg/dL — ABNORMAL HIGH (ref 0.3–1.2)
Total Protein: 6.8 g/dL (ref 6.5–8.1)

## 2018-09-26 LAB — CBC WITH DIFFERENTIAL (CANCER CENTER ONLY)
Abs Immature Granulocytes: 0.01 10*3/uL (ref 0.00–0.07)
BASOS PCT: 2 %
Basophils Absolute: 0 10*3/uL (ref 0.0–0.1)
EOS ABS: 0 10*3/uL (ref 0.0–0.5)
Eosinophils Relative: 1 %
HEMATOCRIT: 26.7 % — AB (ref 36.0–46.0)
Hemoglobin: 9.5 g/dL — ABNORMAL LOW (ref 12.0–15.0)
Immature Granulocytes: 0 %
Lymphocytes Relative: 37 %
Lymphs Abs: 0.9 10*3/uL (ref 0.7–4.0)
MCH: 35.2 pg — ABNORMAL HIGH (ref 26.0–34.0)
MCHC: 35.6 g/dL (ref 30.0–36.0)
MCV: 98.9 fL (ref 80.0–100.0)
MONO ABS: 0.2 10*3/uL (ref 0.1–1.0)
MONOS PCT: 8 %
NEUTROS PCT: 52 %
Neutro Abs: 1.3 10*3/uL — ABNORMAL LOW (ref 1.7–7.7)
Platelet Count: 212 10*3/uL (ref 150–400)
RBC: 2.7 MIL/uL — ABNORMAL LOW (ref 3.87–5.11)
RDW: 16.7 % — ABNORMAL HIGH (ref 11.5–15.5)
WBC Count: 2.5 10*3/uL — ABNORMAL LOW (ref 4.0–10.5)
nRBC: 0 % (ref 0.0–0.2)

## 2018-09-26 NOTE — Patient Instructions (Signed)
Paracentesis °Paracentesis is a procedure to remove excess fluid (ascites) from the belly (abdomen). Ascites can result from certain conditions, such as infection, inflammation, abdominal injury, heart failure, chronic scarring of the liver (cirrhosis), or cancer. Ascites is removed using a needle that is inserted through the skin and tissue into the abdomen. °This procedure may be done: °· To determine the cause of the ascites. °· To relieve symptoms that are caused by the ascites, such as pain or shortness of breath. °· To see if there is bleeding after an abdominal injury. ° °Tell a health care provider about: °· Any allergies you have. °· All medicines you are taking, including vitamins, herbs, eye drops, creams, and over-the-counter medicines. °· Any problems you or family members have had with anesthetic medicines. °· Any blood disorders you have. °· Any surgeries you have had. °· Any medical conditions you have. °· Whether you are pregnant or may be pregnant. °What are the risks? °Generally, this is a safe procedure. However, problems may occur, including: °· Infection. °· Bleeding. °· Injury to an abdominal organ, such as the bowel (large intestine), liver, spleen, or bladder. °· Low blood pressure (hypotension). °· Spreading of cancer, if there are cancer cells in the abdominal fluid. °· Mental status changes in people who have liver disease. These changes would be caused by shifts in the balance of fluids and minerals (electrolytes) in the body. ° °What happens before the procedure? °· Ask your health care provider about: °? Changing or stopping your regular medicines. This is especially important if you are taking diabetes medicines or blood thinners. °? Taking medicines such as aspirin and ibuprofen. These medicines can thin your blood. Do not take these medicines before your procedure if your health care provider instructs you not to. °· A blood sample may be done to determine your blood clotting  time. °· You will be asked to urinate. °What happens during the procedure? °· You may be asked to lie on your back with your head raised (elevated). °· To reduce your risk of infection: °? Your health care team will wash or sanitize their hands. °? Your skin will be washed with soap. °· You will be given a medicine to numb the area (local anesthetic). °· Your abdominal skin will be punctured with a needle or a scalpel. °· A drainage tube will be inserted through the puncture site. Fluid will drain through the tube into a container. °· After enough fluid has been removed, the tube will be removed. °· A sample of the fluid will be sent for examination. °· A bandage (dressing) will be placed over the puncture site. °The procedure may vary among health care providers and hospitals. °What happens after the procedure? °· It is your responsibility to get your test results. Ask your health care provider or the department performing the test when your results will be ready. °This information is not intended to replace advice given to you by your health care provider. Make sure you discuss any questions you have with your health care provider. °Document Released: 04/27/2005 Document Revised: 03/19/2016 Document Reviewed: 12/25/2014 °Elsevier Interactive Patient Education © 2018 [employer] ° °

## 2018-09-26 NOTE — Progress Notes (Signed)
[facility] Progress Note   Kimberly Reed [id number] [birth year] 62 y.o.  Kimberly Reed is managed by Dr. Nicholas Reed  Actively treated with chemotherapy/immunotherapy/hormonal therapy: Yes  Current Therapy: Ibrance & Letrozole  Last Treated:N/A  Assessment: Plan:    Malignant neoplasm of upper-outer quadrant of left breast in female, estrogen receptor positive ([place]) - Plan: Ambulatory referral to [facility]: Ambulatory referral to [place], CBC with Differential ([facility] Only)  Dizziness - Plan: Ambulatory referral to [facility]  Malignant ascites - Plan: US Paracentesis  LFT elevation - Plan: Ammonia, CMP ([place] only)   1) Metastatic estrogen receptor positive breast cancer: Continues on Ibrance and Letrozole.  She is scheduled to see Kimberly Reed on 10/14/18.  2) Abdominal ascites: Pt is s/p paracentesis on 09/20/18.  A paracentesis has been ordered for 09/28/18 at 1400.  Pt to arrive at 1345.  3) Deconditioning and weakness: A referral for home health, home PT, and a home aide has been made.  3) Elevated LFTs: AST is 173 today, ALT is 49 today.  Alkaline phos is 293.  Total bilirubin 2.8.  I will ask Dr. Lindi Reed if he would like to see the patient before 10/14/18.  I will add an ammonia to Wednesday's labs.  Please see After Visit Summary for patient specific instructions.  Future Appointments  Date Time Provider [place]  09/28/2018 12:30 PM [facility] LAB 5 [facility] None  09/28/2018 12:45 PM [facility] [name] FLUSH [facility] None  09/28/2018  2:00 PM [facility] [place]  10/14/2018 11:00 AM [facility] LAB 1 [facility] None  10/14/2018 11:30 AM Kimberly Lose, MD [facility] None  12/19/2018  2:15 PM Kimberly Bible, NP [facility] None    Orders Placed This Encounter  Procedures  . US Paracentesis  . Ammonia  . CBC with Differential ([facility] Only)  . CMP ([facility] only)  . Ambulatory  referral to Home Health       Subjective:   Patient ID:  Kimberly Reed is a 62 y.o. (DOB 1956-08-10) female.  Chief Complaint:  Chief Complaint  Patient presents with  . Fatigue    HPI Kimberly Reed is a 62 y.o. Female with metastatic breast cancer.  She is managed by Dr. Lindi Reed and is currently being treated with Ibrance and Letrozole.  She was last seen on 09/19/18 for perceived decreased urine output and abdominal ascites.  She was referred for a paracentesis which was completed on 09/20/18 with 3.7 liters ascites removed.  It was sent for cytology and returned positive for reactive mesothelial cells present.  She contacted our office requesting an appt today.  She is weak and dizzy and is unable to lift objects or get out of bed for extended periods.  She has leg pain.  She has been increasing her intake of foods which are high in protein.  Pain and weakness in right leg.  Numbness in L leg.  Medications: I have reviewed the patient's current medications.  Allergies:  Allergies  Allergen Reactions  . Other Nausea Only and Other (See Comments)    Antibiotics - Pt cannot identify which antibiotics she reacts to.  Make her nausous and gives her headache.  . Effexor [Venlafaxine] Nausea And Vomiting  . Mucinex [Guaifenesin Er] Nausea Only    Past Medical History:  Diagnosis Date  . Anemia   . Anxiety   . Atypical chest pain   . Breast cancer ([place]) 05/2014   left. completed chemo  10/2014. Did not tolerate anti-estrogen.   . Cancer ([place])    left breast dx. 5'88- chemo planned to start 07-09-14- Dr. Lindi Reed, [facility] follows  . Depression   . Family history of heart disease   . Glaucoma   . Gluten intolerance    Dr. Collene Reed  . Headache syndrome 10/13/2016  . History of syncope 2008   loss of bladder control eval by Neuro  . Hot flashes   . Hyperlipemia   . Hypothyroid   . Insomnia   . Lymphedema    L arm  . Migraine without aura   . Personal history of chemotherapy     . Personal history of radiation therapy   . Shingles 2018  . Wears glasses     Past Surgical History:  Procedure Laterality Date  . BREAST BIOPSY Left 06/07/2014   malignant  . BREAST BIOPSY Left 05/24/2014   malignant  . BREAST LUMPECTOMY Left 01/30/2015   malignant  . BREAST LUMPECTOMY WITH NEEDLE LOCALIZATION AND AXILLARY LYMPH NODE DISSECTION Left 01/30/2015   Procedure: BREAST LUMPECTOMY WITH NEEDLE LOCALIZATION LEFT AXILLARY DISSECTION REMOVAL OF PORT;  Surgeon: Kimberly Seltzer, MD;  Location: [facility];  Service: General;  Laterality: Left;  . CATARACT EXTRACTION Left   . DILATION AND CURETTAGE OF UTERUS    . ESOPHAGOGASTRODUODENOSCOPY  06/01/14  . EXAMINATION UNDER ANESTHESIA  02/24/2013   Procedure: EXAM UNDER ANESTHESIA;  Surgeon: Kimberly Bilis, MD;  Location: [facility];  Service: Gynecology;;  with removal of prolapsing cervical mass; pap smear and endometrial biopsy  . GLAUCOMA SURGERY Left    x1   . PORTACATH PLACEMENT Right 07/06/2014   Procedure: PORT A CATH  PLACEMENT;  Surgeon: Kimberly Seltzer, MD;  Location: [facility];  Service: General;  Laterality: Right;  . TONSILLECTOMY     child  . uterine polyp removed     per hysteroscopy about 1 1/2 yrs ago.    Family History  Problem Relation Age of Onset  . Kidney disease Mother   . Depression Mother   . Early death Mother   . Hypertension Mother   . Heart attack Mother   . Aortic aneurysm Mother   . Hypertension Father   . Dementia Father   . Depression Father   . Kidney disease Brother   . Hypertension Sister   . Heart disease Unknown        "all of moms side"  . Depression Unknown   . Heart failure Cousin     Social History   Socioeconomic History  . Marital status: Single    Spouse name: Not on file  . Number of children: 0  . Years of education: Some college  . Highest education level: Not on file  Occupational History  . 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 Financial resource strain: Not on file  . Food insecurity:    Worry: Not on file    Inability: Not on file  . Transportation needs:    Medical: Not on file    Non-medical: Not on file  Tobacco Use  . Smoking status: Never Smoker  . Smokeless tobacco: Never Used  Substance and Sexual Activity  . Alcohol use: No  . Drug use: No    Comment: in 20's but not now  . Sexual activity: Not Currently    Comment: menarche age 13, P 0, no HRT  Lifestyle  .  Physical activity:    Days per week: Not on file    Minutes per session: Not on file  . Stress: Not on file  Relationships  . Social connections:    Talks on phone: Not on file    Gets together: Not on file    Attends religious service: Not on file    Active member of club or organization: Not on file    Attends meetings of clubs or organizations: Not on file    Relationship status: Not on file  . Intimate partner violence:    Fear of current or ex partner: Not on file    Emotionally abused: Not on file    Physically abused: Not on file    Forced sexual activity: Not on file  Other Topics Concern  . Not on file  Social History Narrative   Single.  College.  Lives alone.  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   Right-handed   Dairy products, takes a daily vitamin, drinks caffeine, uses herbal remedies.   Alarm at home.  Wears her seatbelt.       Past Medical History, Surgical history, Social history, and Family history were reviewed and updated as appropriate.   Please see review of systems for further details on the patient's review from today.   Review of Systems:  Review of Systems  Constitutional: Negative for appetite change, chills, diaphoresis and fever.  HENT: Negative for dental problem, mouth sores and trouble swallowing.   Respiratory: Negative for cough, chest tightness and shortness of breath.   Cardiovascular: Negative for chest pain and  palpitations.  Gastrointestinal: Negative for constipation, diarrhea, nausea and vomiting.  Musculoskeletal: Positive for arthralgias and myalgias.  Neurological: Positive for dizziness and weakness. Negative for syncope and headaches.    Objective:   Physical Exam:  BP 114/80 (BP Location: Right Arm, Patient Position: Sitting)   Pulse 95   Temp 98 F (36.7 C) (Oral)   Resp 18   LMP 02/15/2013   SpO2 100%  ECOG: 1   Physical Exam  Constitutional: No distress.  HENT:  Head: Normocephalic and atraumatic.  Cardiovascular: Normal rate, regular rhythm and normal heart sounds. Exam reveals no gallop and no friction rub.  No murmur heard. Pulmonary/Chest: Effort normal and breath sounds normal. No respiratory distress. She has no wheezes. She has no rales.  Abdominal: Bowel sounds are normal. She exhibits distension. She exhibits no mass. There is no tenderness. There is no rebound and no guarding.  Neurological: She is alert.  Skin: Skin is warm and dry. No rash noted. She is not diaphoretic. No erythema.  Mild jaundice    Lab Review:     Component Value Date/Time   NA 137 09/26/2018 1340   NA 141 08/10/2017   NA 141 01/10/2016 1539   K 3.7 09/26/2018 1340   K 3.8 01/10/2016 1539   CL 103 09/26/2018 1340   CO2 22 09/26/2018 1340   CO2 29 01/10/2016 1539   GLUCOSE 107 (H) 09/26/2018 1340   GLUCOSE 115 01/10/2016 1539   BUN 22 09/26/2018 1340   BUN 18 08/10/2017   BUN 18.1 01/10/2016 1539   CREATININE 1.64 (H) 09/26/2018 1340   CREATININE 1.0 01/10/2016 1539   CALCIUM 10.3 09/26/2018 1340   CALCIUM 10.1 01/10/2016 1539   PROT 6.8 09/26/2018 1340   PROT 7.4 01/10/2016 1539   ALBUMIN 2.9 (L) 09/26/2018 1340   ALBUMIN 4.1 01/10/2016 1539   AST 173 (HH) 09/26/2018 1340  AST 15 01/10/2016 1539   ALT 49 (H) 09/26/2018 1340   ALT 17 01/10/2016 1539   ALKPHOS 293 (H) 09/26/2018 1340   ALKPHOS 92 01/10/2016 1539   BILITOT 2.8 (H) 09/26/2018 1340   BILITOT 0.50 01/10/2016  1539   GFRNONAA 33 (L) 09/26/2018 1340   GFRAA 38 (L) 09/26/2018 1340       Component Value Date/Time   WBC 2.5 (L) 09/26/2018 1340   WBC 9.8 02/24/2018 1145   RBC 2.70 (L) 09/26/2018 1340   HGB 9.5 (L) 09/26/2018 1340   HGB 12.9 01/10/2016 1538   HCT 26.7 (L) 09/26/2018 1340   HCT 38.9 01/10/2016 1538   PLT 212 09/26/2018 1340   PLT 285 01/10/2016 1538   MCV 98.9 09/26/2018 1340   MCV 90.9 01/10/2016 1538   MCH 35.2 (H) 09/26/2018 1340   MCHC 35.6 09/26/2018 1340   RDW 16.7 (H) 09/26/2018 1340   RDW 15.1 (H) 01/10/2016 1538   LYMPHSABS 0.9 09/26/2018 1340   LYMPHSABS 1.7 01/10/2016 1538   MONOABS 0.2 09/26/2018 1340   MONOABS 0.5 01/10/2016 1538   EOSABS 0.0 09/26/2018 1340   EOSABS 0.1 01/10/2016 1538   BASOSABS 0.0 09/26/2018 1340   BASOSABS 0.0 01/10/2016 1538   -------------------------------  Imaging from last 24 hours (if applicable):  Radiology interpretation: Ct Chest W Contrast  Result Date: 09/12/2018 CLINICAL DATA:  62 year old female with history of left-sided breast cancer (stage IV). Evaluate for recurrent or metastatic disease. EXAM: CT CHEST, ABDOMEN, AND PELVIS WITH CONTRAST TECHNIQUE: Multidetector CT imaging of the chest, abdomen and pelvis was performed following the standard protocol during bolus administration of intravenous contrast. CONTRAST:  171mL OMNIPAQUE IOHEXOL 300 MG/ML  SOLN COMPARISON:  CT the chest, abdomen and pelvis 05/26/2018. FINDINGS: CT CHEST FINDINGS Cardiovascular: Heart size is normal. There is no significant pericardial fluid, thickening or pericardial calcification. No atherosclerotic calcifications are noted in the thoracic aorta or the coronary arteries. Mediastinum/Nodes: No pathologically enlarged mediastinal or hilar lymph nodes. Esophagus is unremarkable in appearance. No axillary lymphadenopathy. Surgical clips in the left axillary region from prior lymph node dissection. Lungs/Pleura: A few scattered 1-2 mm pulmonary nodules  are noted throughout the lungs bilaterally, stable compared to the prior study, nonspecific. No larger more suspicious appearing pulmonary nodules or masses are noted. No acute consolidative airspace disease. No pleural effusions. Musculoskeletal: Postoperative changes of lumpectomy in the subareolar aspect of the left breast. Innumerable mixed lytic and sclerotic lesions are again noted throughout the axial and appendicular skeleton, indicative of widespread metastatic disease to the bones. Findings appear unchanged. Soft tissue edema throughout the visualized portions of the left upper extremity, potentially from lymphedema, similar to the prior examination. CT ABDOMEN PELVIS FINDINGS Hepatobiliary: Innumerable hypovascular lesions are again noted throughout the liver, generally similar in size and number to the prior examination. An example of this is a lesion in segment 2 of the liver (axial image 54 of series 2) which currently measures 4.9 x 4.3 cm (previously 5.1 x 4.2 cm on 05/26/2018). No intra or extrahepatic biliary ductal dilatation. Intermediate attenuation material filling the gallbladder which likely reflects biliary sludge. Gallbladder is nearly completely decompressed, without definite wall thickening or surrounding inflammatory changes. Liver has a shrunken appearance and nodular contour, indicative of developing cirrhosis or pseudocirrhosis. Pancreas: No pancreatic mass. No pancreatic ductal dilatation. No pancreatic or peripancreatic fluid or inflammatory changes. Spleen: Unremarkable. Adrenals/Urinary Tract: Bilateral kidneys and bilateral adrenal glands are normal in appearance. No hydroureteronephrosis. Urinary bladder is nearly  decompressed, but otherwise unremarkable in appearance. Stomach/Bowel: Normal appearance of the stomach. No pathologic dilatation of small bowel or colon. Appendix is not confidently identified and may be surgically absent. Vascular/Lymphatic: No significant  atherosclerotic disease, aneurysm or dissection noted in the abdominal or pelvic vasculature. Multiple prominent borderline enlarged and mildly enlarged retroperitoneal lymph nodes are noted, measuring up to 11 mm in short axis in the left para-aortic nodal station (axial image 71 of series 2). Reproductive: Uterus and ovaries are unremarkable in appearance. Other: Moderate volume of ascites.  No pneumoperitoneum. Musculoskeletal: Numerous mixed lytic and sclerotic lesions are again noted throughout the visualized axial and appendicular skeleton, indicative of widespread metastatic disease to the bones. IMPRESSION: 1. Overall, today's study demonstrates similar appearance of metastatic disease throughout the liver. There is increasing moderate volume of ascites which may be malignant. 2. Widespread metastatic disease to the bones, similar to the prior examination. 3. Additional incidental findings, similar to the prior examination, as above. Electronically Signed   By: Vinnie Langton M.D.   On: 09/12/2018 15:43   Nm Bone Scan Whole Body  Result Date: 09/13/2018 CLINICAL DATA:  History of left breast cancer. Generalized body pain and stiffness. EXAM: NUCLEAR MEDICINE WHOLE BODY BONE SCAN TECHNIQUE: Whole body anterior and posterior images were obtained approximately 3 hours after intravenous injection of radiopharmaceutical. RADIOPHARMACEUTICALS:  19.6 mCi Technetium-2m MDP IV COMPARISON:  PET-CT 12/25/2014. FINDINGS: Bilateral renal function excretion. Punctate areas of increased activity noted over the skull, thoracic spine, and right ribs. Skull series, thoracic spine series, right rib series suggested for further evaluation as metastatic disease cannot be excluded. Minimal increased activity noted about both shoulders and knees most likely degenerative. IMPRESSION: Punctate areas of increased activity noted over the skull, thoracic spine, and right ribs. Skull series, thoracic spine series, right rib  series suggested for further evaluation as metastatic disease cannot be excluded. Electronically Signed   By: Marcello Moores  Register   On: 09/13/2018 08:37   Ct Abdomen Pelvis W Contrast  Result Date: 09/12/2018 CLINICAL DATA:  62 year old female with history of left-sided breast cancer (stage IV). Evaluate for recurrent or metastatic disease. EXAM: CT CHEST, ABDOMEN, AND PELVIS WITH CONTRAST TECHNIQUE: Multidetector CT imaging of the chest, abdomen and pelvis was performed following the standard protocol during bolus administration of intravenous contrast. CONTRAST:  180mL OMNIPAQUE IOHEXOL 300 MG/ML  SOLN COMPARISON:  CT the chest, abdomen and pelvis 05/26/2018. FINDINGS: CT CHEST FINDINGS Cardiovascular: Heart size is normal. There is no significant pericardial fluid, thickening or pericardial calcification. No atherosclerotic calcifications are noted in the thoracic aorta or the coronary arteries. Mediastinum/Nodes: No pathologically enlarged mediastinal or hilar lymph nodes. Esophagus is unremarkable in appearance. No axillary lymphadenopathy. Surgical clips in the left axillary region from prior lymph node dissection. Lungs/Pleura: A few scattered 1-2 mm pulmonary nodules are noted throughout the lungs bilaterally, stable compared to the prior study, nonspecific. No larger more suspicious appearing pulmonary nodules or masses are noted. No acute consolidative airspace disease. No pleural effusions. Musculoskeletal: Postoperative changes of lumpectomy in the subareolar aspect of the left breast. Innumerable mixed lytic and sclerotic lesions are again noted throughout the axial and appendicular skeleton, indicative of widespread metastatic disease to the bones. Findings appear unchanged. Soft tissue edema throughout the visualized portions of the left upper extremity, potentially from lymphedema, similar to the prior examination. CT ABDOMEN PELVIS FINDINGS Hepatobiliary: Innumerable hypovascular lesions are again  noted throughout the liver, generally similar in size and number to the prior examination. An  example of this is a lesion in segment 2 of the liver (axial image 54 of series 2) which currently measures 4.9 x 4.3 cm (previously 5.1 x 4.2 cm on 05/26/2018). No intra or extrahepatic biliary ductal dilatation. Intermediate attenuation material filling the gallbladder which likely reflects biliary sludge. Gallbladder is nearly completely decompressed, without definite wall thickening or surrounding inflammatory changes. Liver has a shrunken appearance and nodular contour, indicative of developing cirrhosis or pseudocirrhosis. Pancreas: No pancreatic mass. No pancreatic ductal dilatation. No pancreatic or peripancreatic fluid or inflammatory changes. Spleen: Unremarkable. Adrenals/Urinary Tract: Bilateral kidneys and bilateral adrenal glands are normal in appearance. No hydroureteronephrosis. Urinary bladder is nearly decompressed, but otherwise unremarkable in appearance. Stomach/Bowel: Normal appearance of the stomach. No pathologic dilatation of small bowel or colon. Appendix is not confidently identified and may be surgically absent. Vascular/Lymphatic: No significant atherosclerotic disease, aneurysm or dissection noted in the abdominal or pelvic vasculature. Multiple prominent borderline enlarged and mildly enlarged retroperitoneal lymph nodes are noted, measuring up to 11 mm in short axis in the left para-aortic nodal station (axial image 71 of series 2). Reproductive: Uterus and ovaries are unremarkable in appearance. Other: Moderate volume of ascites.  No pneumoperitoneum. Musculoskeletal: Numerous mixed lytic and sclerotic lesions are again noted throughout the visualized axial and appendicular skeleton, indicative of widespread metastatic disease to the bones. IMPRESSION: 1. Overall, today's study demonstrates similar appearance of metastatic disease throughout the liver. There is increasing moderate volume of  ascites which may be malignant. 2. Widespread metastatic disease to the bones, similar to the prior examination. 3. Additional incidental findings, similar to the prior examination, as above. Electronically Signed   By: Vinnie Langton M.D.   On: 09/12/2018 15:43   US Paracentesis  Result Date: 09/20/2018 INDICATION: Patient with history of stage IV breast cancer, ascites. Request made for diagnostic and therapeutic paracentesis. EXAM: ULTRASOUND GUIDED DIAGNOSTIC AND THERAPEUTIC PARACENTESIS MEDICATIONS: None COMPLICATIONS: None immediate. PROCEDURE: Informed written consent was obtained from the patient after a discussion of the risks, benefits and alternatives to treatment. A timeout was performed prior to the initiation of the procedure. Initial ultrasound scanning demonstrates a moderate amount of ascites within the right mid to lower abdominal quadrant. The right mid to lower abdomen was prepped and draped in the usual sterile fashion. 1% lidocaine was used for local anesthesia. Following this, a 19 gauge, 10-cm, Yueh catheter was introduced. An ultrasound image was saved for documentation purposes. The paracentesis was performed. The catheter was removed and a dressing was applied. The patient tolerated the procedure well without immediate post procedural complication. FINDINGS: A total of approximately 3.7 liters of amber fluid was removed. Samples were sent to the laboratory as requested by the clinical team. IMPRESSION: Successful ultrasound-guided therapeutic paracentesis yielding 3.7 liters of peritoneal fluid. Read by: Rowe Robert, PA-C Electronically Signed   By: Marybelle Killings M.D.   On: 09/20/2018 15:28

## 2018-09-26 NOTE — Progress Notes (Signed)
Pt seen by PA Lucianne Lei only, no RN assessment at this time.  PA aware.  Pt seen by Allie to discuss transportation for Wednesday appts.

## 2018-09-26 NOTE — Telephone Encounter (Signed)
Returned patient's call.  Patient continues to have extreme weakness and dizziness.  Patient states she is unable to lift any objects, unable to get out of bed for long periods of time.  Patient has been complaint with eating protein per previous recommendations.  C/o extreme leg pain, denies any swelling or redness.  Symptom management appointment offered and made.  Patient will have someone drive her d/t weakness.

## 2018-09-28 ENCOUNTER — Inpatient Hospital Stay (HOSPITAL_BASED_OUTPATIENT_CLINIC_OR_DEPARTMENT_OTHER): Payer: Medicare Other | Admitting: Medical

## 2018-09-28 ENCOUNTER — Telehealth: Payer: Self-pay

## 2018-09-28 ENCOUNTER — Inpatient Hospital Stay: Payer: Medicare Other

## 2018-09-28 ENCOUNTER — Ambulatory Visit (HOSPITAL_COMMUNITY)
Admission: RE | Admit: 2018-09-28 | Discharge: 2018-09-28 | Disposition: A | Discharge status: Home / Self Care | Payer: Medicare Other | Source: Ambulatory Visit | Attending: Medical | Admitting: Medical

## 2018-09-28 VITALS — BP 119/61 | HR 95 | Resp 18

## 2018-09-28 DIAGNOSIS — C50412 Malignant neoplasm of upper-outer quadrant of left female breast: Secondary | ICD-10-CM

## 2018-09-28 DIAGNOSIS — R7989 Other specified abnormal findings of blood chemistry: Secondary | ICD-10-CM

## 2018-09-28 DIAGNOSIS — R945 Abnormal results of liver function studies: Secondary | ICD-10-CM

## 2018-09-28 DIAGNOSIS — Z170 Estrogen receptor positive status [ER+]: Principal | ICD-10-CM

## 2018-09-28 DIAGNOSIS — R531 Weakness: Secondary | ICD-10-CM

## 2018-09-28 DIAGNOSIS — R180 Malignant ascites: Secondary | ICD-10-CM | POA: Diagnosis not present

## 2018-09-28 DIAGNOSIS — C7951 Secondary malignant neoplasm of bone: Secondary | ICD-10-CM | POA: Diagnosis not present

## 2018-09-28 LAB — CMP (CANCER CENTER ONLY)
ALBUMIN: 3 g/dL — AB (ref 3.5–5.0)
ALK PHOS: 318 U/L — AB (ref 38–126)
ALT: 50 U/L — AB (ref 0–44)
ANION GAP: 12 (ref 5–15)
AST: 198 U/L — AB (ref 15–41)
BILIRUBIN TOTAL: 3.1 mg/dL — AB (ref 0.3–1.2)
BUN: 29 mg/dL — ABNORMAL HIGH (ref 8–23)
CALCIUM: 10.7 mg/dL — AB (ref 8.9–10.3)
CO2: 23 mmol/L (ref 22–32)
Chloride: 103 mmol/L (ref 98–111)
Creatinine: 1.86 mg/dL — ABNORMAL HIGH (ref 0.44–1.00)
GFR, EST AFRICAN AMERICAN: 33 mL/min — AB (ref >60)
GFR, Estimated: 28 mL/min — ABNORMAL LOW (ref >60)
Glucose, Bld: 106 mg/dL — ABNORMAL HIGH (ref 70–99)
Potassium: 3.8 mmol/L (ref 3.5–5.1)
SODIUM: 138 mmol/L (ref 135–145)
TOTAL PROTEIN: 7.1 g/dL (ref 6.5–8.1)

## 2018-09-28 LAB — CBC WITH DIFFERENTIAL (CANCER CENTER ONLY)
ABS IMMATURE GRANULOCYTES: 0.01 10*3/uL (ref 0.00–0.07)
BASOS ABS: 0 10*3/uL (ref 0.0–0.1)
Basophils Relative: 1 %
Eosinophils Absolute: 0 10*3/uL (ref 0.0–0.5)
Eosinophils Relative: 2 %
HEMATOCRIT: 26.9 % — AB (ref 36.0–46.0)
HEMOGLOBIN: 9.5 g/dL — AB (ref 12.0–15.0)
IMMATURE GRANULOCYTES: 0 %
LYMPHS ABS: 1 10*3/uL (ref 0.7–4.0)
LYMPHS PCT: 43 %
MCH: 35.6 pg — ABNORMAL HIGH (ref 26.0–34.0)
MCHC: 35.3 g/dL (ref 30.0–36.0)
MCV: 100.7 fL — ABNORMAL HIGH (ref 80.0–100.0)
MONOS PCT: 9 %
Monocytes Absolute: 0.2 10*3/uL (ref 0.1–1.0)
NEUTROS PCT: 45 %
NRBC: 0 % (ref 0.0–0.2)
Neutro Abs: 1.1 10*3/uL — ABNORMAL LOW (ref 1.7–7.7)
Platelet Count: 196 10*3/uL (ref 150–400)
RBC: 2.67 MIL/uL — ABNORMAL LOW (ref 3.87–5.11)
RDW: 17.5 % — ABNORMAL HIGH (ref 11.5–15.5)
WBC Count: 2.3 10*3/uL — ABNORMAL LOW (ref 4.0–10.5)

## 2018-09-28 LAB — AMMONIA: Ammonia: 29 umol/L (ref 9–35)

## 2018-09-28 MED ORDER — DENOSUMAB 120 MG/1.7ML ~~LOC~~ SOLN
120.0000 mg | Freq: Once | SUBCUTANEOUS | Status: AC
  Administered 2018-09-28: 120 mg via SUBCUTANEOUS

## 2018-09-28 MED ORDER — DENOSUMAB 120 MG/1.7ML ~~LOC~~ SOLN
SUBCUTANEOUS | Status: AC
  Filled 2018-09-28: qty 1.7

## 2018-09-28 MED ORDER — LIDOCAINE HCL 1 % IJ SOLN
INTRAMUSCULAR | Status: AC
  Filled 2018-09-28: qty 20

## 2018-09-28 NOTE — Progress Notes (Signed)
These preliminary result these preliminary results were noted.  Awaiting final report.

## 2018-09-28 NOTE — Telephone Encounter (Signed)
Received critical lab result for pt AST 198. Dr.Gudena notified. Pt for an paracentesis this afternoon. Next MD appt on 12/20.

## 2018-09-28 NOTE — Procedures (Addendum)
Ultrasound-guided  therapeutic paracentesis performed yielding 2.9 liters of amber fluid. No immediate complications. EBL < 1 cc

## 2018-09-30 ENCOUNTER — Telehealth: Payer: Self-pay | Admitting: *Deleted

## 2018-09-30 NOTE — Telephone Encounter (Signed)
Received TC from patient. She states she is continuing to have difficulty walking- mostly related to her right leg. She states she feels weak, unsteady, spending more time in bed. She states she got a 'shot' earlier in the week and she thought she would feel stronger.  She received Xgeva on 09/28/18.  Advised pt that the injection was to help lower her blood calcium as well as strengthen her bones, but it would take awhile. Advised pt that she has several reasons for her fatigue and weakness. She asked to see Dr. Lindi Adie sooner than 2 weeks.  Please check Dr. Geralyn Flash schedule to see if pt can be seen the week of 10/03/18

## 2018-10-03 ENCOUNTER — Telehealth: Payer: Self-pay

## 2018-10-03 NOTE — Telephone Encounter (Signed)
Returned patient's call.  Left voicemail for patient to call back.

## 2018-10-03 NOTE — Telephone Encounter (Signed)
Returned patient's call.  Patient c/o diarrhea and weakness.   Nurse inquired about how many times diarrhea has occurred.  Patient stated, "I have gone over this with everyone over and over.  It's been going on for years, just forget it."  Nurse provided therapeutic communication.  Inquired about weakness.  Patient then stated, "Why are you trying to change the subject."  Again, nurse provided therapeutic communication.  Patient did not want to speak about anything further.  Nurse encouraged patient to contact us when she wanted to talk about medical issues at her convenience.

## 2018-10-05 ENCOUNTER — Telehealth: Payer: Self-pay

## 2018-10-05 ENCOUNTER — Other Ambulatory Visit: Payer: Medicare Other

## 2018-10-05 NOTE — Telephone Encounter (Signed)
[facility] called to request verbal orders for home health once a week x 9weeks. Ok to per Dr.Magrinat.

## 2018-10-06 ENCOUNTER — Inpatient Hospital Stay (HOSPITAL_BASED_OUTPATIENT_CLINIC_OR_DEPARTMENT_OTHER): Payer: Medicare Other | Admitting: Medical

## 2018-10-06 ENCOUNTER — Telehealth: Payer: Self-pay | Admitting: *Deleted

## 2018-10-06 ENCOUNTER — Telehealth: Payer: Self-pay

## 2018-10-06 VITALS — BP 102/65 | HR 84 | Temp 98.1°F | Resp 22

## 2018-10-06 DIAGNOSIS — L299 Pruritus, unspecified: Secondary | ICD-10-CM

## 2018-10-06 DIAGNOSIS — R1084 Generalized abdominal pain: Secondary | ICD-10-CM | POA: Diagnosis not present

## 2018-10-06 DIAGNOSIS — C7951 Secondary malignant neoplasm of bone: Secondary | ICD-10-CM | POA: Diagnosis not present

## 2018-10-06 DIAGNOSIS — R180 Malignant ascites: Secondary | ICD-10-CM

## 2018-10-06 DIAGNOSIS — C50412 Malignant neoplasm of upper-outer quadrant of left female breast: Secondary | ICD-10-CM

## 2018-10-06 DIAGNOSIS — C787 Secondary malignant neoplasm of liver and intrahepatic bile duct: Secondary | ICD-10-CM

## 2018-10-06 DIAGNOSIS — Z170 Estrogen receptor positive status [ER+]: Secondary | ICD-10-CM

## 2018-10-06 MED ORDER — NAPROXEN 250 MG PO TABS: 250.0000 mg | ORAL_TABLET | Freq: Two times a day (BID) | ORAL | 1 refills | Status: AC

## 2018-10-06 MED ORDER — ACETAMINOPHEN 325 MG PO TABS
650.0000 mg | ORAL_TABLET | Freq: Once | ORAL | Status: AC
  Administered 2018-10-06: 650 mg via ORAL

## 2018-10-06 MED ORDER — ACETAMINOPHEN 325 MG PO TABS
ORAL_TABLET | ORAL | Status: AC
  Filled 2018-10-06: qty 2

## 2018-10-06 MED ORDER — HYDROXYZINE HCL 10 MG PO TABS: 10.0000 mg | ORAL_TABLET | Freq: Two times a day (BID) | ORAL | 1 refills | Status: AC | PRN

## 2018-10-06 NOTE — Telephone Encounter (Signed)
Returned patient's call regarding she would like appt to see Lucianne Lei PA due to she thinks she is having an allergic reaction to Eurcerin cream.  She reports on Sunday or Monday, she applied to an area and the smell was so bad, began burning, itching so bad that bled, reports she feels dizzy, having some trouble breathing at night.  She has stopped using the cream, "threw it away."  Appt made for 1 pm today with Sandi Mealy PA- notified him and made arrangements with [facility] transportation "Radonna Ricker"  [phone] to pick up pt at 12:20 and will take pt back home.  Pt voiced understanding and no other needs per pt.

## 2018-10-06 NOTE — Telephone Encounter (Signed)
"  Itching always present because I'm on Ibrance.  So Itchy start bleeding.  My friend bought a medicated Eucerin for my skin.  Started Eucerin Monday.  I have not slept the past four nights.  The smell of it is in my system.  Tuesday started with extreme dizziness.  Skin looks like raw burns.  My breathing is really heavy at night.  I think I'm having allergic reaction to the cream.  No new medicines, soaps, lotions, detergents.  We don't need to talk about the itching.  I'd like to speak with Dr. Lindi Adie or his nurse.  I left message earlier, never mind."  [name] ended call; did not answer further assessment questions at this time.

## 2018-10-06 NOTE — Patient Instructions (Signed)
Paracentesis °Paracentesis is a procedure to remove excess fluid (ascites) from the belly (abdomen). Ascites can result from certain conditions, such as infection, inflammation, abdominal injury, heart failure, chronic scarring of the liver (cirrhosis), or cancer. Ascites is removed using a needle that is inserted through the skin and tissue into the abdomen. °This procedure may be done: °· To determine the cause of the ascites. °· To relieve symptoms that are caused by the ascites, such as pain or shortness of breath. °· To see if there is bleeding after an abdominal injury. ° °Tell a health care provider about: °· Any allergies you have. °· All medicines you are taking, including vitamins, herbs, eye drops, creams, and over-the-counter medicines. °· Any problems you or family members have had with anesthetic medicines. °· Any blood disorders you have. °· Any surgeries you have had. °· Any medical conditions you have. °· Whether you are pregnant or may be pregnant. °What are the risks? °Generally, this is a safe procedure. However, problems may occur, including: °· Infection. °· Bleeding. °· Injury to an abdominal organ, such as the bowel (large intestine), liver, spleen, or bladder. °· Low blood pressure (hypotension). °· Spreading of cancer, if there are cancer cells in the abdominal fluid. °· Mental status changes in people who have liver disease. These changes would be caused by shifts in the balance of fluids and minerals (electrolytes) in the body. ° °What happens before the procedure? °· Ask your health care provider about: °? Changing or stopping your regular medicines. This is especially important if you are taking diabetes medicines or blood thinners. °? Taking medicines such as aspirin and ibuprofen. These medicines can thin your blood. Do not take these medicines before your procedure if your health care provider instructs you not to. °· A blood sample may be done to determine your blood clotting  time. °· You will be asked to urinate. °What happens during the procedure? °· You may be asked to lie on your back with your head raised (elevated). °· To reduce your risk of infection: °? Your health care team will wash or sanitize their hands. °? Your skin will be washed with soap. °· You will be given a medicine to numb the area (local anesthetic). °· Your abdominal skin will be punctured with a needle or a scalpel. °· A drainage tube will be inserted through the puncture site. Fluid will drain through the tube into a container. °· After enough fluid has been removed, the tube will be removed. °· A sample of the fluid will be sent for examination. °· A bandage (dressing) will be placed over the puncture site. °The procedure may vary among health care providers and hospitals. °What happens after the procedure? °· It is your responsibility to get your test results. Ask your health care provider or the department performing the test when your results will be ready. °This information is not intended to replace advice given to you by your health care provider. Make sure you discuss any questions you have with your health care provider. °Document Released: 04/27/2005 Document Revised: 03/19/2016 Document Reviewed: 12/25/2014 °Elsevier Interactive Patient Education © 2018 [employer] ° °

## 2018-10-07 ENCOUNTER — Telehealth: Payer: Self-pay | Admitting: Emergency Medicine

## 2018-10-07 NOTE — Telephone Encounter (Signed)
Returning pt's phone call asking if there were supposed to be three medications instead of two prescribed by PA Lucianne Lei on her recent [facility] visit.  Assured pt that there were only two prescriptions needed and discussed their uses/effects, and that if there were any further issues or questions she could call back.  Verbalized understanding.

## 2018-10-10 ENCOUNTER — Telehealth: Payer: Self-pay | Admitting: Emergency Medicine

## 2018-10-10 ENCOUNTER — Other Ambulatory Visit: Payer: Self-pay | Admitting: Emergency Medicine

## 2018-10-10 DIAGNOSIS — R1084 Generalized abdominal pain: Secondary | ICD-10-CM

## 2018-10-10 NOTE — Progress Notes (Signed)
[facility] Progress Note   Kimberly Reed [id number] [birth year] 62 y.o.  Kimberly Reed is managed by Dr. Nicholas Lose  Actively treated with chemotherapy/immunotherapy/hormonal therapy: yes  Current Therapy: Ibrance, letrozole, and Xgeva  Assessment: Plan:    Generalized abdominal pain - Plan: acetaminophen (TYLENOL) tablet 650 mg, naproxen (NAPROSYN) 250 MG tablet  Itching - Plan: hydrOXYzine (ATARAX/VISTARIL) 10 MG tablet  Malignant ascites - Plan: US Paracentesis  Malignant neoplasm of upper-outer quadrant of left breast in female, estrogen receptor positive ([place])   Generalized abdominal pain: I discussed with the patient today that her pain could be associated with capsular pain due to metastatic disease to the liver.  The patient was given a prescription for Naprosyn 250 mg p.o. twice daily.  She was instructed to take this with food.  Pruritus: I discussed with the patient that her pruritus could be due to her liver dysfunction secondary to metastatic disease to the liver.  She was given a prescription for Atarax 10 mg p.o. twice daily.  Malignant ascites: The patient has been referred for a therapeutic paracentesis which will be completed on 10/11/2018.  The patient does not have transportation for this to be done tomorrow or Monday.  Labs will be completed prior to the patient's paracentesis.  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  Nicholas Lose and is treated with [name], letrozole, and Xgeva.  She is scheduled to follow-up with Dr. Lindi Adie on 10/14/2018.  She reports today that she cannot talk about her cancer any longer today.  Please see After Visit Summary for patient specific instructions.  Future Appointments  Date Time Provider [place]  10/11/2018 10:00 AM [facility] 2 [facility] None  10/11/2018 11:00 AM [facility] [place]  10/14/2018 11:30 AM Nicholas Lose,  MD [facility] None  12/19/2018  2:15 PM Dennie Bible, NP [facility] None    Orders Placed This Encounter  Procedures  . US Paracentesis       Subjective:   Patient ID:  Kimberly Reed is a 62 y.o. (DOB July 31, 1956) female.  Chief Complaint:  Chief Complaint  Patient presents with  . Abdominal Pain    HPI Kimberly Reed is a 62 y.o. Female with metastatic breast cancer.  She is managed by Dr. Lindi Adie and is currently being treated with Ibrance and Letrozole.  She presents today with report of sharp shooting pain in her stomach which is happened intermittently.  She also reports that she is having itching.  She reports that she cannot sleep, has intermittent dizziness, she reports that she cannot breathe at night.  She states that she is out of pain medications although review of her chart shows that she has not had any medications prescribed to her.  She states that the pain in her abdomen worsened while she was riding over here today.  She reports that the driver that brought her here was driving fast and weaving in and out of traffic.  She yelled at him because of her pain.  She reports that she has been using Eucerin cream for her itching skin.  It is now causing her to have nausea.  She believes that she could be having an allergic reaction to the smell of the Eucerin cream.  She states that she has gone online and found evidence that it is possible for someone to have an allergic reaction to Eucerin cream.  Physical therapy had been ordered for her  however no one contacted her as yet.  Medications: I have reviewed the patient's current medications.  Allergies:  Allergies  Allergen Reactions  . Other Nausea Only and Other (See Comments)    Antibiotics - Pt cannot identify which antibiotics she reacts to.  Make her nausous and gives her headache.  . Effexor [Venlafaxine] Nausea And Vomiting  . Mucinex [Guaifenesin Er] Nausea Only    Past Medical History:  Diagnosis Date  .  Anemia   . Anxiety   . Atypical chest pain   . Breast cancer ([place]) 05/2014   left. completed chemo 10/2014. Did not tolerate anti-estrogen.   . Cancer ([place])    left breast dx. 8'36- chemo planned to start 07-09-14- Dr. Lindi Adie, [facility]  . Depression   . Family history of heart disease   . Glaucoma   . Gluten intolerance    Dr. Collene Mares  . Headache syndrome 10/13/2016  . History of syncope 2008   loss of bladder control eval by Neuro  . Hot flashes   . Hyperlipemia   . Hypothyroid   . Insomnia   . Lymphedema    L arm  . Migraine without aura   . Personal history of chemotherapy   . Personal history of radiation therapy   . Shingles 2018  . Wears glasses     Past Surgical History:  Procedure Laterality Date  . BREAST BIOPSY Left 06/07/2014   malignant  . BREAST BIOPSY Left 05/24/2014   malignant  . BREAST LUMPECTOMY Left 01/30/2015   malignant  . BREAST LUMPECTOMY WITH NEEDLE LOCALIZATION AND AXILLARY LYMPH NODE DISSECTION Left 01/30/2015   Procedure: BREAST LUMPECTOMY WITH NEEDLE LOCALIZATION LEFT AXILLARY DISSECTION REMOVAL OF PORT;  Surgeon: [name], MD;  Location: [facility];  Service: General;  Laterality: Left;  . CATARACT EXTRACTION Left   . DILATION AND CURETTAGE OF UTERUS    . ESOPHAGOGASTRODUODENOSCOPY  06/01/14  . EXAMINATION UNDER ANESTHESIA  02/24/2013   Procedure: EXAM UNDER ANESTHESIA;  Surgeon: Azalia Bilis, MD;  Location: [facility];  Service: Gynecology;;  with removal of prolapsing cervical mass; pap smear and endometrial biopsy  . GLAUCOMA SURGERY Left    x1   . PORTACATH PLACEMENT Right 07/06/2014   Procedure: PORT A CATH  PLACEMENT;  Surgeon: [name], MD;  Location[facility];  Service: General;  Laterality: Right;  . TONSILLECTOMY     child  . uterine polyp removed     per hysteroscopy about 1 1/2 yrs ago.    Family History  Problem Relation Age of Onset  . Kidney disease Mother   . Depression Mother   .  Early death Mother   . Hypertension Mother   . Heart attack Mother   . Aortic aneurysm Mother   . Hypertension Father   . Dementia Father   . Depression Father   . Kidney disease Brother   . Hypertension Sister   . Heart disease Unknown        "all of moms side"  . Depression Unknown   . Heart failure Cousin     Social History   Socioeconomic History  . Marital status: Single    Spouse name: Not on file  . Number of children: 0  . Years of education: Some college  . Highest education level: Not on file  Occupational History  . 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  Financial resource strain: Not on file  . Food insecurity:    Worry: Not on file    Inability: Not on file  . Transportation needs:    Medical: Not on file    Non-medical: Not on file  Tobacco Use  . Smoking status: Never Smoker  . Smokeless tobacco: Never Used  Substance and Sexual Activity  . Alcohol use: No  . Drug use: No    Comment: in 20's but not now  . Sexual activity: Not Currently    Comment: menarche age 83, P 0, no HRT  Lifestyle  . Physical activity:    Days per week: Not on file    Minutes per session: Not on file  . Stress: Not on file  Relationships  . Social connections:    Talks on phone: Not on file    Gets together: Not on file    Attends religious service: Not on file    Active member of club or organization: Not on file    Attends meetings of clubs or organizations: Not on file    Relationship status: Not on file  . Intimate partner violence:    Fear of current or ex partner: Not on file    Emotionally abused: Not on file    Physically abused: Not on file    Forced sexual activity: Not on file  Other Topics Concern  . Not on file  Social History Narrative   Single.  College.  Lives alone.  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   Right-handed   Dairy products, takes a daily  vitamin, drinks caffeine, uses herbal remedies.   Alarm at home.  Wears her seatbelt.       Past Medical History, Surgical history, Social history, and Family history were reviewed and updated as appropriate.   Please see review of systems for further details on the patient's review from today.   Review of Systems:  Review of Systems  Constitutional: Negative for activity change, appetite change, chills, diaphoresis and fever.  Respiratory: Negative for cough, chest tightness and shortness of breath.   Cardiovascular: Negative for chest pain, palpitations and leg swelling.  Gastrointestinal: Positive for abdominal pain and nausea. Negative for abdominal distention, constipation, diarrhea and vomiting.  Neurological: Positive for dizziness.  Psychiatric/Behavioral: Positive for sleep disturbance.    Objective:   Physical Exam:  BP 102/65 (BP Location: Right Arm, Patient Position: Sitting)   Pulse 84   Temp 98.1 F (36.7 C) (Oral)   Resp (!) 22   LMP 02/15/2013   SpO2 100%  ECOG: 1  Physical Exam Constitutional:      General: She is not in acute distress.    Appearance: She is not diaphoretic.  HENT:     Head: Normocephalic and atraumatic.  Cardiovascular:     Rate and Rhythm: Normal rate and regular rhythm.     Heart sounds: Normal heart sounds. No murmur. No friction rub. No gallop.   Pulmonary:     Effort: Pulmonary effort is normal. No respiratory distress.     Breath sounds: Normal breath sounds. No stridor. No wheezing or rales.  Abdominal:     General: Bowel sounds are decreased. There is distension.     Palpations: Abdomen is soft. There is no mass.     Tenderness: There is generalized abdominal tenderness. There is no guarding or rebound.  Skin:    General: Skin is warm and dry.  Neurological:     Mental Status:  She is alert.     Coordination: Coordination normal.     Lab Review:     Component Value Date/Time   NA 138 09/28/2018 1202   NA 141  08/10/2017   NA 141 01/10/2016 1539   K 3.8 09/28/2018 1202   K 3.8 01/10/2016 1539   CL 103 09/28/2018 1202   CO2 23 09/28/2018 1202   CO2 29 01/10/2016 1539   GLUCOSE 106 (H) 09/28/2018 1202   GLUCOSE 115 01/10/2016 1539   BUN 29 (H) 09/28/2018 1202   BUN 18 08/10/2017   BUN 18.1 01/10/2016 1539   CREATININE 1.86 (H) 09/28/2018 1202   CREATININE 1.0 01/10/2016 1539   CALCIUM 10.7 (H) 09/28/2018 1202   CALCIUM 10.1 01/10/2016 1539   PROT 7.1 09/28/2018 1202   PROT 7.4 01/10/2016 1539   ALBUMIN 3.0 (L) 09/28/2018 1202   ALBUMIN 4.1 01/10/2016 1539   AST 198 (HH) 09/28/2018 1202   AST 15 01/10/2016 1539   ALT 50 (H) 09/28/2018 1202   ALT 17 01/10/2016 1539   ALKPHOS 318 (H) 09/28/2018 1202   ALKPHOS 92 01/10/2016 1539   BILITOT 3.1 (H) 09/28/2018 1202   BILITOT 0.50 01/10/2016 1539   GFRNONAA 28 (L) 09/28/2018 1202   GFRAA 33 (L) 09/28/2018 1202       Component Value Date/Time   WBC 2.3 (L) 09/28/2018 1202   WBC 9.8 02/24/2018 1145   RBC 2.67 (L) 09/28/2018 1202   HGB 9.5 (L) 09/28/2018 1202   HGB 12.9 01/10/2016 1538   HCT 26.9 (L) 09/28/2018 1202   HCT 38.9 01/10/2016 1538   PLT 196 09/28/2018 1202   PLT 285 01/10/2016 1538   MCV 100.7 (H) 09/28/2018 1202   MCV 90.9 01/10/2016 1538   MCH 35.6 (H) 09/28/2018 1202   MCHC 35.3 09/28/2018 1202   RDW 17.5 (H) 09/28/2018 1202   RDW 15.1 (H) 01/10/2016 1538   LYMPHSABS 1.0 09/28/2018 1202   LYMPHSABS 1.7 01/10/2016 1538   MONOABS 0.2 09/28/2018 1202   MONOABS 0.5 01/10/2016 1538   EOSABS 0.0 09/28/2018 1202   EOSABS 0.1 01/10/2016 1538   BASOSABS 0.0 09/28/2018 1202   BASOSABS 0.0 01/10/2016 1538   -------------------------------  Imaging from last 24 hours (if applicable):  Radiology interpretation: Ct Chest W Contrast  Result Date: 09/12/2018 CLINICAL DATA:  62 year old female with history of left-sided breast cancer (stage IV). Evaluate for recurrent or metastatic disease. EXAM: CT CHEST, ABDOMEN, AND  PELVIS WITH CONTRAST TECHNIQUE: Multidetector CT imaging of the chest, abdomen and pelvis was performed following the standard protocol during bolus administration of intravenous contrast. CONTRAST:  11mL OMNIPAQUE IOHEXOL 300 MG/ML  SOLN COMPARISON:  CT the chest, abdomen and pelvis 05/26/2018. FINDINGS: CT CHEST FINDINGS Cardiovascular: Heart size is normal. There is no significant pericardial fluid, thickening or pericardial calcification. No atherosclerotic calcifications are noted in the thoracic aorta or the coronary arteries. Mediastinum/Nodes: No pathologically enlarged mediastinal or hilar lymph nodes. Esophagus is unremarkable in appearance. No axillary lymphadenopathy. Surgical clips in the left axillary region from prior lymph node dissection. Lungs/Pleura: A few scattered 1-2 mm pulmonary nodules are noted throughout the lungs bilaterally, stable compared to the prior study, nonspecific. No larger more suspicious appearing pulmonary nodules or masses are noted. No acute consolidative airspace disease. No pleural effusions. Musculoskeletal: Postoperative changes of lumpectomy in the subareolar aspect of the left breast. Innumerable mixed lytic and sclerotic lesions are again noted throughout the axial and appendicular skeleton, indicative of widespread metastatic disease to the  bones. Findings appear unchanged. Soft tissue edema throughout the visualized portions of the left upper extremity, potentially from lymphedema, similar to the prior examination. CT ABDOMEN PELVIS FINDINGS Hepatobiliary: Innumerable hypovascular lesions are again noted throughout the liver, generally similar in size and number to the prior examination. An example of this is a lesion in segment 2 of the liver (axial image 54 of series 2) which currently measures 4.9 x 4.3 cm (previously 5.1 x 4.2 cm on 05/26/2018). No intra or extrahepatic biliary ductal dilatation. Intermediate attenuation material filling the gallbladder which  likely reflects biliary sludge. Gallbladder is nearly completely decompressed, without definite wall thickening or surrounding inflammatory changes. Liver has a shrunken appearance and nodular contour, indicative of developing cirrhosis or pseudocirrhosis. Pancreas: No pancreatic mass. No pancreatic ductal dilatation. No pancreatic or peripancreatic fluid or inflammatory changes. Spleen: Unremarkable. Adrenals/Urinary Tract: Bilateral kidneys and bilateral adrenal glands are normal in appearance. No hydroureteronephrosis. Urinary bladder is nearly decompressed, but otherwise unremarkable in appearance. Stomach/Bowel: Normal appearance of the stomach. No pathologic dilatation of small bowel or colon. Appendix is not confidently identified and may be surgically absent. Vascular/Lymphatic: No significant atherosclerotic disease, aneurysm or dissection noted in the abdominal or pelvic vasculature. Multiple prominent borderline enlarged and mildly enlarged retroperitoneal lymph nodes are noted, measuring up to 11 mm in short axis in the left para-aortic nodal station (axial image 71 of series 2). Reproductive: Uterus and ovaries are unremarkable in appearance. Other: Moderate volume of ascites.  No pneumoperitoneum. Musculoskeletal: Numerous mixed lytic and sclerotic lesions are again noted throughout the visualized axial and appendicular skeleton, indicative of widespread metastatic disease to the bones. IMPRESSION: 1. Overall, today's study demonstrates similar appearance of metastatic disease throughout the liver. There is increasing moderate volume of ascites which may be malignant. 2. Widespread metastatic disease to the bones, similar to the prior examination. 3. Additional incidental findings, similar to the prior examination, as above. Electronically Signed   By: Vinnie Langton M.D.   On: 09/12/2018 15:43   Nm Bone Scan Whole Body  Result Date: 09/13/2018 CLINICAL DATA:  History of left breast cancer.  Generalized body pain and stiffness. EXAM: NUCLEAR MEDICINE WHOLE BODY BONE SCAN TECHNIQUE: Whole body anterior and posterior images were obtained approximately 3 hours after intravenous injection of radiopharmaceutical. RADIOPHARMACEUTICALS:  19.6 mCi Technetium-50m MDP IV COMPARISON:  PET-CT 12/25/2014. FINDINGS: Bilateral renal function excretion. Punctate areas of increased activity noted over the skull, thoracic spine, and right ribs. Skull series, thoracic spine series, right rib series suggested for further evaluation as metastatic disease cannot be excluded. Minimal increased activity noted about both shoulders and knees most likely degenerative. IMPRESSION: Punctate areas of increased activity noted over the skull, thoracic spine, and right ribs. Skull series, thoracic spine series, right rib series suggested for further evaluation as metastatic disease cannot be excluded. Electronically Signed   By: Marcello Moores  Register   On: 09/13/2018 08:37   Ct Abdomen Pelvis W Contrast  Result Date: 09/12/2018 CLINICAL DATA:  62 year old female with history of left-sided breast cancer (stage IV). Evaluate for recurrent or metastatic disease. EXAM: CT CHEST, ABDOMEN, AND PELVIS WITH CONTRAST TECHNIQUE: Multidetector CT imaging of the chest, abdomen and pelvis was performed following the standard protocol during bolus administration of intravenous contrast. CONTRAST:  138mL OMNIPAQUE IOHEXOL 300 MG/ML  SOLN COMPARISON:  CT the chest, abdomen and pelvis 05/26/2018. FINDINGS: CT CHEST FINDINGS Cardiovascular: Heart size is normal. There is no significant pericardial fluid, thickening or pericardial calcification. No atherosclerotic calcifications are noted  in the thoracic aorta or the coronary arteries. Mediastinum/Nodes: No pathologically enlarged mediastinal or hilar lymph nodes. Esophagus is unremarkable in appearance. No axillary lymphadenopathy. Surgical clips in the left axillary region from prior lymph node  dissection. Lungs/Pleura: A few scattered 1-2 mm pulmonary nodules are noted throughout the lungs bilaterally, stable compared to the prior study, nonspecific. No larger more suspicious appearing pulmonary nodules or masses are noted. No acute consolidative airspace disease. No pleural effusions. Musculoskeletal: Postoperative changes of lumpectomy in the subareolar aspect of the left breast. Innumerable mixed lytic and sclerotic lesions are again noted throughout the axial and appendicular skeleton, indicative of widespread metastatic disease to the bones. Findings appear unchanged. Soft tissue edema throughout the visualized portions of the left upper extremity, potentially from lymphedema, similar to the prior examination. CT ABDOMEN PELVIS FINDINGS Hepatobiliary: Innumerable hypovascular lesions are again noted throughout the liver, generally similar in size and number to the prior examination. An example of this is a lesion in segment 2 of the liver (axial image 54 of series 2) which currently measures 4.9 x 4.3 cm (previously 5.1 x 4.2 cm on 05/26/2018). No intra or extrahepatic biliary ductal dilatation. Intermediate attenuation material filling the gallbladder which likely reflects biliary sludge. Gallbladder is nearly completely decompressed, without definite wall thickening or surrounding inflammatory changes. Liver has a shrunken appearance and nodular contour, indicative of developing cirrhosis or pseudocirrhosis. Pancreas: No pancreatic mass. No pancreatic ductal dilatation. No pancreatic or peripancreatic fluid or inflammatory changes. Spleen: Unremarkable. Adrenals/Urinary Tract: Bilateral kidneys and bilateral adrenal glands are normal in appearance. No hydroureteronephrosis. Urinary bladder is nearly decompressed, but otherwise unremarkable in appearance. Stomach/Bowel: Normal appearance of the stomach. No pathologic dilatation of small bowel or colon. Appendix is not confidently identified and may  be surgically absent. Vascular/Lymphatic: No significant atherosclerotic disease, aneurysm or dissection noted in the abdominal or pelvic vasculature. Multiple prominent borderline enlarged and mildly enlarged retroperitoneal lymph nodes are noted, measuring up to 11 mm in short axis in the left para-aortic nodal station (axial image 71 of series 2). Reproductive: Uterus and ovaries are unremarkable in appearance. Other: Moderate volume of ascites.  No pneumoperitoneum. Musculoskeletal: Numerous mixed lytic and sclerotic lesions are again noted throughout the visualized axial and appendicular skeleton, indicative of widespread metastatic disease to the bones. IMPRESSION: 1. Overall, today's study demonstrates similar appearance of metastatic disease throughout the liver. There is increasing moderate volume of ascites which may be malignant. 2. Widespread metastatic disease to the bones, similar to the prior examination. 3. Additional incidental findings, similar to the prior examination, as above. Electronically Signed   By: Vinnie Langton M.D.   On: 09/12/2018 15:43   US Paracentesis  Result Date: 09/28/2018 INDICATION: Patient with history of stage IV breast cancer, recurrent ascites. Request made for therapeutic paracentesis up to 5 liters. EXAM: ULTRASOUND GUIDED THERAPEUTIC PARACENTESIS MEDICATIONS: None COMPLICATIONS: None immediate. PROCEDURE: Informed written consent was obtained from the patient after a discussion of the risks, benefits and alternatives to treatment. A timeout was performed prior to the initiation of the procedure. Initial ultrasound scanning demonstrates a moderate amount of ascites within the left lower abdominal quadrant. The left lower abdomen was prepped and draped in the usual sterile fashion. 1% lidocaine was used for local anesthesia. Following this, a 19 gauge, 10-cm, Yueh catheter was introduced. An ultrasound image was saved for documentation purposes. The paracentesis was  performed. The catheter was removed and a dressing was applied. The patient tolerated the procedure well without immediate  post procedural complication. FINDINGS: A total of approximately 2.9 liters of amber colored fluid was removed. IMPRESSION: Successful ultrasound-guided therapeutic paracentesis yielding 2.9 liters of peritoneal fluid. Read by: Rowe Robert, PA-C Electronically Signed   By: Jerilynn Mages.  Shick M.D.   On: 09/28/2018 16:18   US Paracentesis  Result Date: 09/20/2018 INDICATION: Patient with history of stage IV breast cancer, ascites. Request made for diagnostic and therapeutic paracentesis. EXAM: ULTRASOUND GUIDED DIAGNOSTIC AND THERAPEUTIC PARACENTESIS MEDICATIONS: None COMPLICATIONS: None immediate. PROCEDURE: Informed written consent was obtained from the patient after a discussion of the risks, benefits and alternatives to treatment. A timeout was performed prior to the initiation of the procedure. Initial ultrasound scanning demonstrates a moderate amount of ascites within the right mid to lower abdominal quadrant. The right mid to lower abdomen was prepped and draped in the usual sterile fashion. 1% lidocaine was used for local anesthesia. Following this, a 19 gauge, 10-cm, Yueh catheter was introduced. An ultrasound image was saved for documentation purposes. The paracentesis was performed. The catheter was removed and a dressing was applied. The patient tolerated the procedure well without immediate post procedural complication. FINDINGS: A total of approximately 3.7 liters of amber fluid was removed. Samples were sent to the laboratory as requested by the clinical team. IMPRESSION: Successful ultrasound-guided therapeutic paracentesis yielding 3.7 liters of peritoneal fluid. Read by: Rowe Robert, PA-C Electronically Signed   By: Marybelle Killings M.D.   On: 09/20/2018 15:28

## 2018-10-10 NOTE — Telephone Encounter (Signed)
Returning pt's VM asking to have paracentesis move dup to today d/t pain.  Spoke with scheduling and there are no openings before her originally scheduled time at 11 am on 12/17.  Fanny Skates will provide pt's transportation to her lab appt at 10 am on 12/17 at [facility], after which the patient will go to her paracentesis.  No restrictions at this time per scheduling.  Pt verbalized understanding of plan and states she will be ready at [place] on 12/17 for her ride.  Understands she can call back with any questions/concerns or worsening condition.

## 2018-10-11 ENCOUNTER — Ambulatory Visit (HOSPITAL_COMMUNITY)
Admission: RE | Admit: 2018-10-11 | Discharge: 2018-10-11 | Disposition: A | Discharge status: Home / Self Care | Payer: Medicare Other | Source: Ambulatory Visit | Attending: Medical | Admitting: Medical

## 2018-10-11 ENCOUNTER — Ambulatory Visit (HOSPITAL_BASED_OUTPATIENT_CLINIC_OR_DEPARTMENT_OTHER): Payer: Medicare Other | Admitting: Medical

## 2018-10-11 ENCOUNTER — Encounter (HOSPITAL_COMMUNITY): Payer: Self-pay

## 2018-10-11 ENCOUNTER — Other Ambulatory Visit: Payer: Self-pay

## 2018-10-11 ENCOUNTER — Inpatient Hospital Stay: Payer: Medicare Other

## 2018-10-11 ENCOUNTER — Inpatient Hospital Stay (HOSPITAL_BASED_OUTPATIENT_CLINIC_OR_DEPARTMENT_OTHER): Payer: Medicare Other | Admitting: Medical

## 2018-10-11 ENCOUNTER — Encounter: Payer: Medicare Other | Admitting: Medical

## 2018-10-11 ENCOUNTER — Telehealth: Payer: Self-pay | Admitting: *Deleted

## 2018-10-11 VITALS — BP 114/51 | HR 74 | Temp 98.2°F | Resp 20

## 2018-10-11 DIAGNOSIS — C50412 Malignant neoplasm of upper-outer quadrant of left female breast: Secondary | ICD-10-CM

## 2018-10-11 DIAGNOSIS — C787 Secondary malignant neoplasm of liver and intrahepatic bile duct: Secondary | ICD-10-CM

## 2018-10-11 DIAGNOSIS — N179 Acute kidney failure, unspecified: Secondary | ICD-10-CM

## 2018-10-11 DIAGNOSIS — R180 Malignant ascites: Secondary | ICD-10-CM

## 2018-10-11 DIAGNOSIS — R112 Nausea with vomiting, unspecified: Secondary | ICD-10-CM

## 2018-10-11 DIAGNOSIS — R1084 Generalized abdominal pain: Secondary | ICD-10-CM

## 2018-10-11 DIAGNOSIS — C7951 Secondary malignant neoplasm of bone: Secondary | ICD-10-CM

## 2018-10-11 DIAGNOSIS — R748 Abnormal levels of other serum enzymes: Secondary | ICD-10-CM

## 2018-10-11 LAB — CBC WITH DIFFERENTIAL (CANCER CENTER ONLY)
ABS IMMATURE GRANULOCYTES: 0.03 10*3/uL (ref 0.00–0.07)
BASOS ABS: 0 10*3/uL (ref 0.0–0.1)
Basophils Relative: 1 %
Eosinophils Absolute: 0 10*3/uL (ref 0.0–0.5)
Eosinophils Relative: 0 %
HEMOGLOBIN: 9.9 g/dL — AB (ref 12.0–15.0)
IMMATURE GRANULOCYTES: 1 %
Lymphocytes Relative: 27 %
Lymphs Abs: 1 10*3/uL (ref 0.7–4.0)
MCH: 35.4 pg — ABNORMAL HIGH (ref 26.0–34.0)
MCV: 90.7 fL (ref 80.0–100.0)
Monocytes Absolute: 0.3 10*3/uL (ref 0.1–1.0)
Monocytes Relative: 9 %
NEUTROS ABS: 2.3 10*3/uL (ref 1.7–7.7)
NEUTROS PCT: 62 %
NRBC: 0 % (ref 0.0–0.2)
PLATELETS: 295 10*3/uL (ref 150–400)
WBC Count: 3.7 10*3/uL — ABNORMAL LOW (ref 4.0–10.5)

## 2018-10-11 LAB — CMP (CANCER CENTER ONLY)
ALBUMIN: 2.8 g/dL — AB (ref 3.5–5.0)
ALT: 63 U/L — ABNORMAL HIGH (ref 0–44)
ANION GAP: 16 — AB (ref 5–15)
AST: 264 U/L (ref 15–41)
Alkaline Phosphatase: 538 U/L — ABNORMAL HIGH (ref 38–126)
BILIRUBIN TOTAL: 5.4 mg/dL — AB (ref 0.3–1.2)
BUN: 62 mg/dL — ABNORMAL HIGH (ref 8–23)
CO2: 17 mmol/L — ABNORMAL LOW (ref 22–32)
Calcium: 8.5 mg/dL — ABNORMAL LOW (ref 8.9–10.3)
Chloride: 96 mmol/L — ABNORMAL LOW (ref 98–111)
Creatinine: 3.74 mg/dL (ref 0.44–1.00)
GFR, Est AFR Am: 14 mL/min — ABNORMAL LOW (ref >60)
GFR, Estimated: 12 mL/min — ABNORMAL LOW (ref >60)
GLUCOSE: 124 mg/dL — AB (ref 70–99)
POTASSIUM: 3.8 mmol/L (ref 3.5–5.1)
SODIUM: 129 mmol/L — AB (ref 135–145)
Total Protein: 6.8 g/dL (ref 6.5–8.1)

## 2018-10-11 MED ORDER — OXYCODONE-ACETAMINOPHEN 5-325 MG PO TABS
1.0000 | ORAL_TABLET | Freq: Once | ORAL | Status: AC
  Administered 2018-10-11: 1 via ORAL

## 2018-10-11 MED ORDER — LIDOCAINE HCL 1 % IJ SOLN
INTRAMUSCULAR | Status: AC
  Filled 2018-10-11: qty 20

## 2018-10-11 MED ORDER — SODIUM CHLORIDE 0.9 % IV SOLN
INTRAVENOUS | Status: DC
  Administered 2018-10-11: 14:00:00 via INTRAVENOUS
  Filled 2018-10-11: qty 250

## 2018-10-11 MED ORDER — OXYCODONE-ACETAMINOPHEN 5-325 MG PO TABS
ORAL_TABLET | ORAL | Status: AC
  Filled 2018-10-11: qty 1

## 2018-10-11 MED ORDER — PROMETHAZINE HCL 25 MG/ML IJ SOLN
INTRAMUSCULAR | Status: AC
  Filled 2018-10-11: qty 1

## 2018-10-11 MED ORDER — OXYCODONE-ACETAMINOPHEN 5-325 MG PO TABS: 1.0000 | ORAL_TABLET | Freq: Once | ORAL | Status: DC

## 2018-10-11 MED ORDER — LORAZEPAM 1 MG PO TABS
ORAL_TABLET | ORAL | Status: AC
  Filled 2018-10-11: qty 1

## 2018-10-11 MED ORDER — LORAZEPAM 1 MG PO TABS
0.5000 mg | ORAL_TABLET | Freq: Once | ORAL | Status: AC
  Administered 2018-10-11: 0.5 mg via ORAL

## 2018-10-11 MED ORDER — PROMETHAZINE HCL 25 MG/ML IJ SOLN: 12.5000 mg | Freq: Once | INTRAMUSCULAR | Status: DC

## 2018-10-11 NOTE — Telephone Encounter (Signed)
This RN was notified of panic labs per [facility] today.  Pt scheduled to see PA in [facility]- this RN called Sandi Mealy PA and verified receipt of abnormal values.

## 2018-10-11 NOTE — Progress Notes (Addendum)
[facility] Progress Note   Kimberly Reed [id number] December 08, 1955 62 y.o.  Kimberly Reed is managed by Dr. Nicholas Lose  Actively treated with chemotherapy/immunotherapy/hormonal therapy: yes  Current Therapy: Ibrance, letrozole, and Xgeva  Assessment: Plan:    Generalized abdominal pain - Plan: oxyCODONE-acetaminophen (PERCOCET/ROXICET) 5-325 MG per tablet 1 tablet  Malignant neoplasm of upper-outer quadrant of left female breast, unspecified estrogen receptor status ([place])  Acute renal failure, unspecified acute renal failure type (HCC)  Elevated liver enzymes  Non-intractable vomiting with nausea, unspecified vomiting type  Generalized abdominal pain: [name] was given Percocet 5-325, 1 tablet PO x 2  Nausea: [name] was given Ativan 0.5 mg SL x 1.  Acute renal failure: A CMP returned with a creatinine of 3.74.   Elevated LFT's: A CMP returned with a bilirubin of 5.4.  Metastatic breast cancer with progressive liver dysfunction: [name] was seen with Dr. Nicholas Lose. Dr. Lindi Adie discussed with her that her cancer was progressing rapidly and that there were no additional treatments that he could recommend. She was agreeable to transition to hospice care. She is being admitted to [facility]  Please see After Visit Summary for patient specific instructions.  Future Appointments  Date Time Provider [place]  10/14/2018 11:30 AM Nicholas Lose, MD [facility] None  12/19/2018  2:15 PM Dennie Bible, NP [facility] None    No orders of the defined types were placed in this encounter.      Subjective:   Patient ID:  Kimberly Reed is a 62 y.o. (DOB 08-02-1956) female.  Chief Complaint: No chief complaint on file.   HPI Kimberly Reed   is a 62 y.o. Female with metastatic breast cancer. She is managed by Dr. Lindi Adie and is currently being treated with Ibrance and Letrozole.  She presents today after being seen in ultrasound earlier today for  consideration of a repeat paracentesis. She was unable to get up to the table for the procedure due to pain and weakness.  She had labs completed earlier today which returned showing a sodium of 129, BUN of 62, creatinine of 3.74, AST of 264, ALT of 63, alkaline phosphatase of 538, and total bilirubin of 5.4.  The patient was seen with Dr. Lindi Adie.  He explained to her that he did not believe there were any additional treatment options available to her and that her cancer had become very aggressive.  He also stated that he did not believe that she could continue to live independently.  His recommendation was that she be admitted to inpatient hospice.  She is agreeable to this plan.  Medications: I have reviewed the patient's current medications.  Allergies:  Allergies  Allergen Reactions  . Other Nausea Only and Other (See Comments)    Antibiotics - Pt cannot identify which antibiotics she reacts to.  Make her nausous and gives her headache.  . Effexor [Venlafaxine] Nausea And Vomiting  . Mucinex [Guaifenesin Er] Nausea Only    Past Medical History:  Diagnosis Date  . Anemia   . Anxiety   . Atypical chest pain   . Breast cancer ([place]) 05/2014   left. completed chemo 10/2014. Did not tolerate anti-estrogen.   . Cancer ([place])    left breast dx. 7'67- chemo planned to start 07-09-14- Dr. Lindi Adie, [facility] follows  . Depression   . Family history of heart disease   . Glaucoma   . Gluten intolerance    Dr. Collene Mares  . Headache syndrome 10/13/2016  .  History of syncope 2008   loss of bladder control eval by Neuro  . Hot flashes   . Hyperlipemia   . Hypothyroid   . Insomnia   . Lymphedema    L arm  . Migraine without aura   . Personal history of chemotherapy   . Personal history of radiation therapy   . Shingles 2018  . Wears glasses     Past Surgical History:  Procedure Laterality Date  . BREAST BIOPSY Left 06/07/2014   malignant  . BREAST BIOPSY Left 05/24/2014   malignant  .  BREAST LUMPECTOMY Left 01/30/2015   malignant  . BREAST LUMPECTOMY WITH NEEDLE LOCALIZATION AND AXILLARY LYMPH NODE DISSECTION Left 01/30/2015   Procedure: BREAST LUMPECTOMY WITH NEEDLE LOCALIZATION LEFT AXILLARY DISSECTION REMOVAL OF PORT;  Surgeon: [name], MD;  Location: [facility];  Service: General;  Laterality: Left;  . CATARACT EXTRACTION Left   . DILATION AND CURETTAGE OF UTERUS    . ESOPHAGOGASTRODUODENOSCOPY  06/01/14  . EXAMINATION UNDER ANESTHESIA  02/24/2013   Procedure: EXAM UNDER ANESTHESIA;  Surgeon: Azalia Bilis, MD;  Location: [facility];  Service: Gynecology;;  with removal of prolapsing cervical mass; pap smear and endometrial biopsy  . GLAUCOMA SURGERY Left    x1   . PORTACATH PLACEMENT Right 07/06/2014   Procedure: PORT A CATH  PLACEMENT;  Surgeon: [name], MD;  Location: [facility];  Service: General;  Laterality: Right;  . TONSILLECTOMY     child  . uterine polyp removed     per hysteroscopy about 1 1/2 yrs ago.    Family History  Problem Relation Age of Onset  . Kidney disease Mother   . Depression Mother   . Early death Mother   . Hypertension Mother   . Heart attack Mother   . Aortic aneurysm Mother   . Hypertension Father   . Dementia Father   . Depression Father   . Kidney disease Brother   . Hypertension Sister   . Heart disease Unknown        "all of moms side"  . Depression Unknown   . Heart failure Cousin     Social History   Socioeconomic History  . Marital status: Single    Spouse name: Not on file  . Number of children: 0  . Years of education: Some college  . Highest education level: Not on file  Occupational History  . 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 Financial resource strain: Not on file  . Food insecurity:    Worry: Not on file    Inability: Not on file  . Transportation needs:    Medical: Not on file    Non-medical: Not  on file  Tobacco Use  . Smoking status: Never Smoker  . Smokeless tobacco: Never Used  Substance and Sexual Activity  . Alcohol use: No  . Drug use: No    Comment: in 20's but not now  . Sexual activity: Not Currently    Comment: menarche age 89, P 0, no HRT  Lifestyle  . Physical activity:    Days per week: Not on file    Minutes per session: Not on file  . Stress: Not on file  Relationships  . Social connections:    Talks on phone: Not on file    Gets together: Not on file    Attends religious service: Not on file  Active member of club or organization: Not on file    Attends meetings of clubs or organizations: Not on file    Relationship status: Not on file  . Intimate partner violence:    Fear of current or ex partner: Not on file    Emotionally abused: Not on file    Physically abused: Not on file    Forced sexual activity: Not on file  Other Topics Concern  . Not on file  Social History Narrative   Single.  College.  Lives alone.  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   Right-handed   Dairy products, takes a daily vitamin, drinks caffeine, uses herbal remedies.   Alarm at home.  Wears her seatbelt.       Past Medical History, Surgical history, Social history, and Family history were reviewed and updated as appropriate.   Please see review of systems for further details on the patient's review from today.   Review of Systems:  Review of Systems  Constitutional: Positive for fatigue. Negative for activity change, appetite change, chills, diaphoresis and fever.  Respiratory: Negative for cough, chest tightness and shortness of breath.   Cardiovascular: Negative for chest pain, palpitations and leg swelling.  Gastrointestinal: Positive for abdominal distention and abdominal pain. Negative for constipation, diarrhea, nausea and vomiting.  Neurological: Positive for weakness.    Objective:   Physical Exam:  LMP 02/15/2013  ECOG:  2  Physical Exam Constitutional:      General: She is not in acute distress.    Appearance: She is not diaphoretic.  HENT:     Head: Normocephalic and atraumatic.  Neck:     Musculoskeletal: Normal range of motion and neck supple.  Cardiovascular:     Rate and Rhythm: Normal rate and regular rhythm.     Heart sounds: Normal heart sounds. No murmur. No friction rub. No gallop.   Pulmonary:     Effort: Pulmonary effort is normal. No respiratory distress.     Breath sounds: Normal breath sounds. No wheezing or rales.  Abdominal:     General: Bowel sounds are normal. There is distension.     Palpations: Abdomen is soft. There is no mass.     Tenderness: There is abdominal tenderness. There is no guarding or rebound.  Lymphadenopathy:     Cervical: No cervical adenopathy.  Skin:    General: Skin is warm and dry.     Findings: No erythema or rash.  Neurological:     Mental Status: She is alert.     Coordination: Coordination abnormal (The patient is ambulating with the use of a wheelchair.).     Lab Review:     Component Value Date/Time   NA 129 (L) 10/11/2018 1003   NA 141 08/10/2017   NA 141 01/10/2016 1539   K 3.8 10/11/2018 1003   K 3.8 01/10/2016 1539   CL 96 (L) 10/11/2018 1003   CO2 17 (L) 10/11/2018 1003   CO2 29 01/10/2016 1539   GLUCOSE 124 (H) 10/11/2018 1003   GLUCOSE 115 01/10/2016 1539   BUN 62 (H) 10/11/2018 1003   BUN 18 08/10/2017   BUN 18.1 01/10/2016 1539   CREATININE 3.74 (HH) 10/11/2018 1003   CREATININE 1.0 01/10/2016 1539   CALCIUM 8.5 (L) 10/11/2018 1003   CALCIUM 10.1 01/10/2016 1539   PROT 6.8 10/11/2018 1003   PROT 7.4 01/10/2016 1539   ALBUMIN 2.8 (L) 10/11/2018 1003   ALBUMIN 4.1 01/10/2016 1539   AST  264 (HH) 10/11/2018 1003   AST 15 01/10/2016 1539   ALT 63 (H) 10/11/2018 1003   ALT 17 01/10/2016 1539   ALKPHOS 538 (H) 10/11/2018 1003   ALKPHOS 92 01/10/2016 1539   BILITOT 5.4 (HH) 10/11/2018 1003   BILITOT 0.50 01/10/2016 1539    GFRNONAA 12 (L) 10/11/2018 1003   GFRAA 14 (L) 10/11/2018 1003       Component Value Date/Time   WBC 3.7 (L) 10/11/2018 1003   WBC 9.8 02/24/2018 1145   RBC RESULTS UNAVAILABLE DUE TO INTERFERING SUBSTANCE 10/11/2018 1003   HGB 9.9 (L) 10/11/2018 1003   HGB 12.9 01/10/2016 1538   HCT RESULTS UNAVAILABLE DUE TO INTERFERING SUBSTANCE 10/11/2018 1003   HCT 38.9 01/10/2016 1538   PLT 295 10/11/2018 1003   PLT 285 01/10/2016 1538   MCV 90.7 10/11/2018 1003   MCV 90.9 01/10/2016 1538   MCH 35.4 (H) 10/11/2018 1003   MCHC RESULTS UNAVAILABLE DUE TO INTERFERING SUBSTANCE 10/11/2018 1003   RDW RESULTS UNAVAILABLE DUE TO INTERFERING SUBSTANCE 10/11/2018 1003   RDW 15.1 (H) 01/10/2016 1538   LYMPHSABS 1.0 10/11/2018 1003   LYMPHSABS 1.7 01/10/2016 1538   MONOABS 0.3 10/11/2018 1003   MONOABS 0.5 01/10/2016 1538   EOSABS 0.0 10/11/2018 1003   EOSABS 0.1 01/10/2016 1538   BASOSABS 0.0 10/11/2018 1003   BASOSABS 0.0 01/10/2016 1538   -------------------------------  Imaging from last 24 hours (if applicable):  Radiology interpretation: Ct Chest W Contrast  Result Date: 09/12/2018 CLINICAL DATA:  62 year old female with history of left-sided breast cancer (stage IV). Evaluate for recurrent or metastatic disease. EXAM: CT CHEST, ABDOMEN, AND PELVIS WITH CONTRAST TECHNIQUE: Multidetector CT imaging of the chest, abdomen and pelvis was performed following the standard protocol during bolus administration of intravenous contrast. CONTRAST:  112mL OMNIPAQUE IOHEXOL 300 MG/ML  SOLN COMPARISON:  CT the chest, abdomen and pelvis 05/26/2018. FINDINGS: CT CHEST FINDINGS Cardiovascular: Heart size is normal. There is no significant pericardial fluid, thickening or pericardial calcification. No atherosclerotic calcifications are noted in the thoracic aorta or the coronary arteries. Mediastinum/Nodes: No pathologically enlarged mediastinal or hilar lymph nodes. Esophagus is unremarkable in appearance. No  axillary lymphadenopathy. Surgical clips in the left axillary region from prior lymph node dissection. Lungs/Pleura: A few scattered 1-2 mm pulmonary nodules are noted throughout the lungs bilaterally, stable compared to the prior study, nonspecific. No larger more suspicious appearing pulmonary nodules or masses are noted. No acute consolidative airspace disease. No pleural effusions. Musculoskeletal: Postoperative changes of lumpectomy in the subareolar aspect of the left breast. Innumerable mixed lytic and sclerotic lesions are again noted throughout the axial and appendicular skeleton, indicative of widespread metastatic disease to the bones. Findings appear unchanged. Soft tissue edema throughout the visualized portions of the left upper extremity, potentially from lymphedema, similar to the prior examination. CT ABDOMEN PELVIS FINDINGS Hepatobiliary: Innumerable hypovascular lesions are again noted throughout the liver, generally similar in size and number to the prior examination. An example of this is a lesion in segment 2 of the liver (axial image 54 of series 2) which currently measures 4.9 x 4.3 cm (previously 5.1 x 4.2 cm on 05/26/2018). No intra or extrahepatic biliary ductal dilatation. Intermediate attenuation material filling the gallbladder which likely reflects biliary sludge. Gallbladder is nearly completely decompressed, without definite wall thickening or surrounding inflammatory changes. Liver has a shrunken appearance and nodular contour, indicative of developing cirrhosis or pseudocirrhosis. Pancreas: No pancreatic mass. No pancreatic ductal dilatation. No pancreatic or peripancreatic fluid  or inflammatory changes. Spleen: Unremarkable. Adrenals/Urinary Tract: Bilateral kidneys and bilateral adrenal glands are normal in appearance. No hydroureteronephrosis. Urinary bladder is nearly decompressed, but otherwise unremarkable in appearance. Stomach/Bowel: Normal appearance of the stomach. No  pathologic dilatation of small bowel or colon. Appendix is not confidently identified and may be surgically absent. Vascular/Lymphatic: No significant atherosclerotic disease, aneurysm or dissection noted in the abdominal or pelvic vasculature. Multiple prominent borderline enlarged and mildly enlarged retroperitoneal lymph nodes are noted, measuring up to 11 mm in short axis in the left para-aortic nodal station (axial image 71 of series 2). Reproductive: Uterus and ovaries are unremarkable in appearance. Other: Moderate volume of ascites.  No pneumoperitoneum. Musculoskeletal: Numerous mixed lytic and sclerotic lesions are again noted throughout the visualized axial and appendicular skeleton, indicative of widespread metastatic disease to the bones. IMPRESSION: 1. Overall, today's study demonstrates similar appearance of metastatic disease throughout the liver. There is increasing moderate volume of ascites which may be malignant. 2. Widespread metastatic disease to the bones, similar to the prior examination. 3. Additional incidental findings, similar to the prior examination, as above. Electronically Signed   By: Vinnie Langton M.D.   On: 09/12/2018 15:43   Nm Bone Scan Whole Body  Result Date: 09/13/2018 CLINICAL DATA:  History of left breast cancer. Generalized body pain and stiffness. EXAM: NUCLEAR MEDICINE WHOLE BODY BONE SCAN TECHNIQUE: Whole body anterior and posterior images were obtained approximately 3 hours after intravenous injection of radiopharmaceutical. RADIOPHARMACEUTICALS:  19.6 mCi Technetium-5m MDP IV COMPARISON:  PET-CT 12/25/2014. FINDINGS: Bilateral renal function excretion. Punctate areas of increased activity noted over the skull, thoracic spine, and right ribs. Skull series, thoracic spine series, right rib series suggested for further evaluation as metastatic disease cannot be excluded. Minimal increased activity noted about both shoulders and knees most likely degenerative.  IMPRESSION: Punctate areas of increased activity noted over the skull, thoracic spine, and right ribs. Skull series, thoracic spine series, right rib series suggested for further evaluation as metastatic disease cannot be excluded. Electronically Signed   By: Marcello Moores  Register   On: 09/13/2018 08:37   Ct Abdomen Pelvis W Contrast  Result Date: 09/12/2018 CLINICAL DATA:  62 year old female with history of left-sided breast cancer (stage IV). Evaluate for recurrent or metastatic disease. EXAM: CT CHEST, ABDOMEN, AND PELVIS WITH CONTRAST TECHNIQUE: Multidetector CT imaging of the chest, abdomen and pelvis was performed following the standard protocol during bolus administration of intravenous contrast. CONTRAST:  129mL OMNIPAQUE IOHEXOL 300 MG/ML  SOLN COMPARISON:  CT the chest, abdomen and pelvis 05/26/2018. FINDINGS: CT CHEST FINDINGS Cardiovascular: Heart size is normal. There is no significant pericardial fluid, thickening or pericardial calcification. No atherosclerotic calcifications are noted in the thoracic aorta or the coronary arteries. Mediastinum/Nodes: No pathologically enlarged mediastinal or hilar lymph nodes. Esophagus is unremarkable in appearance. No axillary lymphadenopathy. Surgical clips in the left axillary region from prior lymph node dissection. Lungs/Pleura: A few scattered 1-2 mm pulmonary nodules are noted throughout the lungs bilaterally, stable compared to the prior study, nonspecific. No larger more suspicious appearing pulmonary nodules or masses are noted. No acute consolidative airspace disease. No pleural effusions. Musculoskeletal: Postoperative changes of lumpectomy in the subareolar aspect of the left breast. Innumerable mixed lytic and sclerotic lesions are again noted throughout the axial and appendicular skeleton, indicative of widespread metastatic disease to the bones. Findings appear unchanged. Soft tissue edema throughout the visualized portions of the left upper  extremity, potentially from lymphedema, similar to the prior examination. CT ABDOMEN  PELVIS FINDINGS Hepatobiliary: Innumerable hypovascular lesions are again noted throughout the liver, generally similar in size and number to the prior examination. An example of this is a lesion in segment 2 of the liver (axial image 54 of series 2) which currently measures 4.9 x 4.3 cm (previously 5.1 x 4.2 cm on 05/26/2018). No intra or extrahepatic biliary ductal dilatation. Intermediate attenuation material filling the gallbladder which likely reflects biliary sludge. Gallbladder is nearly completely decompressed, without definite wall thickening or surrounding inflammatory changes. Liver has a shrunken appearance and nodular contour, indicative of developing cirrhosis or pseudocirrhosis. Pancreas: No pancreatic mass. No pancreatic ductal dilatation. No pancreatic or peripancreatic fluid or inflammatory changes. Spleen: Unremarkable. Adrenals/Urinary Tract: Bilateral kidneys and bilateral adrenal glands are normal in appearance. No hydroureteronephrosis. Urinary bladder is nearly decompressed, but otherwise unremarkable in appearance. Stomach/Bowel: Normal appearance of the stomach. No pathologic dilatation of small bowel or colon. Appendix is not confidently identified and may be surgically absent. Vascular/Lymphatic: No significant atherosclerotic disease, aneurysm or dissection noted in the abdominal or pelvic vasculature. Multiple prominent borderline enlarged and mildly enlarged retroperitoneal lymph nodes are noted, measuring up to 11 mm in short axis in the left para-aortic nodal station (axial image 71 of series 2). Reproductive: Uterus and ovaries are unremarkable in appearance. Other: Moderate volume of ascites.  No pneumoperitoneum. Musculoskeletal: Numerous mixed lytic and sclerotic lesions are again noted throughout the visualized axial and appendicular skeleton, indicative of widespread metastatic disease to the  bones. IMPRESSION: 1. Overall, today's study demonstrates similar appearance of metastatic disease throughout the liver. There is increasing moderate volume of ascites which may be malignant. 2. Widespread metastatic disease to the bones, similar to the prior examination. 3. Additional incidental findings, similar to the prior examination, as above. Electronically Signed   By: Vinnie Langton M.D.   On: 09/12/2018 15:43   US Paracentesis  Result Date: 09/28/2018 INDICATION: Patient with history of stage IV breast cancer, recurrent ascites. Request made for therapeutic paracentesis up to 5 liters. EXAM: ULTRASOUND GUIDED THERAPEUTIC PARACENTESIS MEDICATIONS: None COMPLICATIONS: None immediate. PROCEDURE: Informed written consent was obtained from the patient after a discussion of the risks, benefits and alternatives to treatment. A timeout was performed prior to the initiation of the procedure. Initial ultrasound scanning demonstrates a moderate amount of ascites within the left lower abdominal quadrant. The left lower abdomen was prepped and draped in the usual sterile fashion. 1% lidocaine was used for local anesthesia. Following this, a 19 gauge, 10-cm, Yueh catheter was introduced. An ultrasound image was saved for documentation purposes. The paracentesis was performed. The catheter was removed and a dressing was applied. The patient tolerated the procedure well without immediate post procedural complication. FINDINGS: A total of approximately 2.9 liters of amber colored fluid was removed. IMPRESSION: Successful ultrasound-guided therapeutic paracentesis yielding 2.9 liters of peritoneal fluid. Read by: Rowe Robert, PA-C Electronically Signed   By: Jerilynn Mages.  Shick M.D.   On: 09/28/2018 16:18   US Paracentesis  Result Date: 09/20/2018 INDICATION: Patient with history of stage IV breast cancer, ascites. Request made for diagnostic and therapeutic paracentesis. EXAM: ULTRASOUND GUIDED DIAGNOSTIC AND THERAPEUTIC  PARACENTESIS MEDICATIONS: None COMPLICATIONS: None immediate. PROCEDURE: Informed written consent was obtained from the patient after a discussion of the risks, benefits and alternatives to treatment. A timeout was performed prior to the initiation of the procedure. Initial ultrasound scanning demonstrates a moderate amount of ascites within the right mid to lower abdominal quadrant. The right mid to lower abdomen was prepped  and draped in the usual sterile fashion. 1% lidocaine was used for local anesthesia. Following this, a 19 gauge, 10-cm, Yueh catheter was introduced. An ultrasound image was saved for documentation purposes. The paracentesis was performed. The catheter was removed and a dressing was applied. The patient tolerated the procedure well without immediate post procedural complication. FINDINGS: A total of approximately 3.7 liters of amber fluid was removed. Samples were sent to the laboratory as requested by the clinical team. IMPRESSION: Successful ultrasound-guided therapeutic paracentesis yielding 3.7 liters of peritoneal fluid. Read by: Rowe Robert, PA-C Electronically Signed   By: Marybelle Killings M.D.   On: 09/20/2018 15:28     Attending Note  I personally saw and examined [name]. The plan of care was discussed with her. I agree with the assessment and plan as documented above. I discussed with the patient that she has very rapidly advancing metastatic breast cancer that cannot be treated any further.  She has worsening jaundice worsening LFTs worsening ascites and worsening pain symptoms. She is completely bedridden and is here in a  [place]. I recommended hospice care with immediate effect. We requested [facility] who came by and evaluated her and took her to their facility. I discussed with her about CODE STATUS and she is DNR CC. Signed Harriette Ohara, MD       This patient was seen with Dr. Lindi Adie with my treatment plan reviewed with him. He expressed agreement with  my medical management of this patient.  Attending Note  I personally saw and examined [name]. The plan of care was discussed with her. I agree with the assessment and plan as documented above. I discussed with Shinika that her disease is rapidly progressing and that no amount of chemotherapy can save her life.  I informed her that we are to stop all systemic treatments and focus on quality at the end of life.  Because she is in no position to go home, we requested hospice at [facility] to come and take her to their facility. Fortunately they have had a bed and they were able to take her. Signed Harriette Ohara, MD

## 2018-10-11 NOTE — Progress Notes (Signed)
Unable to obtain adequate IV access.  Sandi Mealy, Pa notified and orders received to discontinue IV and to administer one percocet 5/325and 0.5 p.o. ativan

## 2018-10-11 NOTE — Progress Notes (Signed)
Per Dr.Gudena, pt referral to be made to hospice today. Pt being seen by symptom management provider. Due pt abnormal labs, pt will not be able to go home at this time. May need to have pt admitted or have hospice come in at the cancer center to evaluate pt. Called [facility] to notify them of request. Awaiting to hear update if admission to hospice is possible today or not.

## 2018-10-12 ENCOUNTER — Ambulatory Visit: Payer: Medicare Other | Admitting: Hematology and Oncology

## 2018-10-14 ENCOUNTER — Ambulatory Visit: Payer: Medicare Other | Admitting: Hematology and Oncology

## 2018-10-14 ENCOUNTER — Other Ambulatory Visit: Payer: Medicare Other

## 2018-10-14 ENCOUNTER — Encounter: Payer: Self-pay | Admitting: Hematology and Oncology

## 2018-12-15 NOTE — Progress Notes (Deleted)
[facility]  PATIENT: Kimberly Reed DOB: 05-02-1956   REASON FOR VISIT: Obstructive sleep apnea here for initial CPAP compliance HISTORY FROM: Patient alone at visit    HISTORY OF PRESENT ILLNESS:  Kimberly Reed is a 63 y.o. female , seen here as in a referral/ revisit  from Dr. Jannifer Franklin for a sleep apnea evaluation.  Chief complaint according to patient : " I had insomnia last year, not now, but I may have apnea"  P M Hx: see Epic note. Patient is confused, distracted .  She did not fill any paper work out, and she is on her phone calling her work place while in exam room 10.   Last year she had chronic insomnia for many many months, she did have limited daytime functioning at work, but she feels cognitively more impaired now..  Sleep habits are as follows: On average her most common bedtime is between 10 and 10:30 PM, she does currently not have trouble falling asleep, mostly promptly asleep at this time. She can sleep through until 2 or 3 AM and she has her first bathroom break, a total of 2-3 times each night. She rises in the morning at 6:30, she is often spontaneously awake before her alarm rings at that time. She feels groggy, not restored not refreshed. Due to lymphedema in her left arm she has also problems finding a comfortable sleep position. She wakes up with headaches but the headaches do not wake her. She wears an elastic sleeve. At night she has a Velcro brace on her left arm, and she feels as if the time of beginning to use a Velcro brace has been the time when she started to have more vivid dreams, strange and sometimes lucid dreams. These dreams are emotionally taxing, and they seem to be triggered by emotions. But not nightmarish. She gets between 4 and 6 hours of sleep, describes her sleep as fragmented. On weekends, she sleeps all day and night and feels even more groggy- not refreshed.  She thinks she snores, she has a dry mouth. Mondays are  difficult at work.  UPDATE 10/21/2019CM Ms.Kimberly Reed, 63 year old female returns for follow-up with newly diagnosed obstructive sleep apnea here for CPAP follow-up.  She also has a history of insomnia.  She has stage IV breast cancer.  She had a hospital admission in March for acute renal failure.  Since that time she has been seen in the ER for septic bursitis of the elbow and a wound infection.  She has severe lymphedema of her left upper extremity.  CPAP compliance dated 07/16/2018-08/14/2018 shows compliance greater than 4 hours at 53%.  Average usage 4 hours 15 minutes.  Set pressure 5 to 15 cm.  EPR level 3 significant leak 95th percentile 26.2.  AHI 1.7 she returns for reevaluation. REVIEW OF SYSTEMS: Full 14 system review of systems performed and notable only for those listed, all others are neg:  Constitutional: Fatigue Cardiovascular: neg Ear/Nose/Throat: neg  Skin: neg Eyes: neg Respiratory: neg Gastroitestinal: Swollen abdomen nausea vomiting Hematology/Lymphatic: neg  Endocrine: Intolerance to heat and cold Musculoskeletal:neg Allergy/Immunology: neg Neurological: Memory loss  Psychiatric: Depression anxiety Sleep : Obstructive sleep apnea with CPAP   ALLERGIES: Allergies  Allergen Reactions  . Other Nausea Only and Other (See Comments)    Antibiotics - Pt cannot identify which antibiotics she reacts to.  Make her nausous and gives her headache.  . Effexor [Venlafaxine] Nausea And Vomiting  . Mucinex [Guaifenesin Er] Nausea Only  HOME MEDICATIONS: Outpatient Medications Prior to Visit  Medication Sig Dispense Refill  . acetaminophen (TYLENOL) 325 MG tablet Take 2 tablets (650 mg total) by mouth every 6 (six) hours as needed for mild pain or moderate pain (or Fever >/= 101). 30 tablet 0  . b complex vitamins tablet Take 1 tablet by mouth daily.    . brimonidine-timolol (COMBIGAN) 0.2-0.5 % ophthalmic solution Place 1 drop into both eyes every 12 (twelve) hours.     .  dorzolamide (TRUSOPT) 2 % ophthalmic solution Place 1 drop into both eyes 2 (two) times daily.     . furosemide (LASIX) 20 MG tablet Take 1 tablet (20 mg total) by mouth 2 (two) times daily. 60 tablet 3  . hydrOXYzine (ATARAX/VISTARIL) 10 MG tablet Take 1 tablet (10 mg total) by mouth 2 (two) times daily as needed for itching. 60 tablet 1  . IBRANCE 75 MG capsule TAKE ONE CAPSULE BY MOUTH DAILY WITH FOOD FOR 21 DAYS OF A 28 DAY CYCLE. SWALLOW WHOLE. DO NOT TAKE IF CAPSULES ARE BROKEN OR CRACKED. AVOID 21 capsule 2  . letrozole (FEMARA) 2.5 MG tablet Take 1 tablet (2.5 mg total) by mouth daily. 90 tablet 3  . levothyroxine (SYNTHROID, LEVOTHROID) 150 MCG tablet TAKE 1 TABLET BY MOUTH EVERY DAY ON EMPTY STOMACH IN THE MORNING  0  . naproxen (NAPROSYN) 250 MG tablet Take 1 tablet (250 mg total) by mouth 2 (two) times daily with a meal. 60 tablet 1  . Omega-3 Fatty Acids (FISH OIL) 1000 MG CAPS Take 1,000 mg by mouth daily.    [name] OVER THE COUNTER MEDICATION [place] supplement    . OVER THE COUNTER MEDICATION Tumeric    . Probiotic Product (PROBIOTIC PO) Take by mouth.    . SELENIUM PO Take by mouth.    . Travoprost, BAK Free, (TRAVATAN) 0.004 % SOLN ophthalmic solution Place 1 drop into both eyes at bedtime.      No facility-administered medications prior to visit.     PAST MEDICAL HISTORY: Past Medical History:  Diagnosis Date  . Anemia   . Anxiety   . Atypical chest pain   . Breast cancer ([place]) 05/2014   left. completed chemo 10/2014. Did not tolerate anti-estrogen.   . Cancer ([place])    left breast dx. 5'36- chemo planned to start 07-09-14- Dr. Lindi Adie, [facility]  . Depression   . Family history of heart disease   . Glaucoma   . Gluten intolerance    Dr. Collene Mares  . Headache syndrome 10/13/2016  . History of syncope 2008   loss of bladder control eval by Neuro  . Hot flashes   . Hyperlipemia   . Hypothyroid   . Insomnia   . Lymphedema    L arm  . Migraine without aura     . Personal history of chemotherapy   . Personal history of radiation therapy   . Shingles 2018  . Wears glasses     PAST SURGICAL HISTORY: Past Surgical History:  Procedure Laterality Date  . BREAST BIOPSY Left 06/07/2014   malignant  . BREAST BIOPSY Left 05/24/2014   malignant  . BREAST LUMPECTOMY Left 01/30/2015   malignant  . BREAST LUMPECTOMY WITH NEEDLE LOCALIZATION AND AXILLARY LYMPH NODE DISSECTION Left 01/30/2015   Procedure: BREAST LUMPECTOMY WITH NEEDLE LOCALIZATION LEFT AXILLARY DISSECTION REMOVAL OF PORT;  Surgeon: [name], MD;  Location: [facility];  Service: General;  Laterality: Left;  . CATARACT EXTRACTION Left   .  DILATION AND CURETTAGE OF UTERUS    . ESOPHAGOGASTRODUODENOSCOPY  06/01/14  . EXAMINATION UNDER ANESTHESIA  02/24/2013   Procedure: EXAM UNDER ANESTHESIA;  Surgeon: Azalia Bilis, MD;  Location: [facility];  Service: Gynecology;;  with removal of prolapsing cervical mass; pap smear and endometrial biopsy  . GLAUCOMA SURGERY Left    x1   . PORTACATH PLACEMENT Right 07/06/2014   Procedure: PORT A CATH  PLACEMENT;  Surgeon: [name], MD;  Location: [facility];  Service: General;  Laterality: Right;  . TONSILLECTOMY     child  . uterine polyp removed     per hysteroscopy about 1 1/2 yrs ago.    FAMILY HISTORY: Family History  Problem Relation Age of Onset  . Kidney disease Mother   . Depression Mother   . Early death Mother   . Hypertension Mother   . Heart attack Mother   . Aortic aneurysm Mother   . Hypertension Father   . Dementia Father   . Depression Father   . Kidney disease Brother   . Hypertension Sister   . Heart disease Unknown        "all of moms side"  . Depression Unknown   . Heart failure Cousin     SOCIAL HISTORY: Social History   Socioeconomic History  . Marital status: Single    Spouse name: Not on file  . Number of children: 0  . Years of education: Some college  . Highest education level: Not  on file  Occupational History  . 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 Financial resource strain: Not on file  . Food insecurity:    Worry: Not on file    Inability: Not on file  . Transportation needs:    Medical: Not on file    Non-medical: Not on file  Tobacco Use  . Smoking status: Never Smoker  . Smokeless tobacco: Never Used  Substance and Sexual Activity  . Alcohol use: No  . Drug use: No    Comment: in 20's but not now  . Sexual activity: Not Currently    Comment: menarche age 68, P 0, no HRT  Lifestyle  . Physical activity:    Days per week: Not on file    Minutes per session: Not on file  . Stress: Not on file  Relationships  . Social connections:    Talks on phone: Not on file    Gets together: Not on file    Attends religious service: Not on file    Active member of club or organization: Not on file    Attends meetings of clubs or organizations: Not on file    Relationship status: Not on file  . Intimate partner violence:    Fear of current or ex partner: Not on file    Emotionally abused: Not on file    Physically abused: Not on file    Forced sexual activity: Not on file  Other Topics Concern  . Not on file  Social History Narrative   Single.  College.  Lives alone.  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   Right-handed   Dairy products, takes a daily vitamin, drinks caffeine, uses herbal remedies.   Alarm at home.  Wears her seatbelt.        PHYSICAL EXAM  There were no vitals filed for this visit. There is no height or weight on  file to calculate BMI.  Generalized: Well developed, obese female in no acute distress  Head: normocephalic and atraumatic,. Oropharynx benign [place] 4 Neck: Supple, circumference 17 Cardiac: Regular rate rhythm, no murmur  Lungs clear Musculoskeletal: No deformity  Skin lymphedema left arm Neurological examination    Mentation: Alert oriented to time, place, history taking. Attention span and concentration appropriate. Recent and remote memory intact.  Follows all commands speech and language fluent.   Cranial nerve II-XII: Pupils were equal round reactive to light extraocular movements were full, visual field were full on confrontational test. Facial sensation and strength were normal. hearing was intact to finger rubbing bilaterally. Uvula tongue midline. head turning and shoulder shrug were normal and symmetric.Tongue protrusion into cheek strength was normal. Motor: normal bulk and tone, full strength in the BUE, BLE,  Sensory: normal and symmetric to light touch,  Coordination: finger-nose-finger, no dysmetria. Gait and Station: Rising up from seated position without assistance, wide-based stance,  moderate stride, no difficulty with turns, no assistive device DIAGNOSTIC DATA (LABS, IMAGING, TESTING) - I reviewed patient records, labs, notes, testing and imaging myself where available.  Lab Results  Component Value Date   WBC 3.7 (L) 10/11/2018   HGB 9.9 (L) 10/11/2018   HCT RESULTS UNAVAILABLE DUE TO INTERFERING SUBSTANCE 10/11/2018   MCV 90.7 10/11/2018   PLT 295 10/11/2018      Component Value Date/Time   NA 129 (L) 10/11/2018 1003   NA 141 08/10/2017   NA 141 01/10/2016 1539   K 3.8 10/11/2018 1003   K 3.8 01/10/2016 1539   CL 96 (L) 10/11/2018 1003   CO2 17 (L) 10/11/2018 1003   CO2 29 01/10/2016 1539   GLUCOSE 124 (H) 10/11/2018 1003   GLUCOSE 115 01/10/2016 1539   BUN 62 (H) 10/11/2018 1003   BUN 18 08/10/2017   BUN 18.1 01/10/2016 1539   CREATININE 3.74 (HH) 10/11/2018 1003   CREATININE 1.0 01/10/2016 1539   CALCIUM 8.5 (L) 10/11/2018 1003   CALCIUM 10.1 01/10/2016 1539   PROT 6.8 10/11/2018 1003   PROT 7.4 01/10/2016 1539   ALBUMIN 2.8 (L) 10/11/2018 1003   ALBUMIN 4.1 01/10/2016 1539   AST 264 (HH) 10/11/2018 1003   AST 15 01/10/2016 1539   ALT 63 (H) 10/11/2018 1003    ALT 17 01/10/2016 1539   ALKPHOS 538 (H) 10/11/2018 1003   ALKPHOS 92 01/10/2016 1539   BILITOT 5.4 (HH) 10/11/2018 1003   BILITOT 0.50 01/10/2016 1539   GFRNONAA 12 (L) 10/11/2018 1003   GFRAA 14 (L) 10/11/2018 1003   Lab Results  Component Value Date   CHOL 248 (H) 01/16/2015   HDL 53.10 01/16/2015   LDLCALC 176 (H) 01/16/2015   LDLDIRECT 167.3 03/16/2011   TRIG 96.0 01/16/2015   CHOLHDL 5 01/16/2015   No results found for: HGBA1C Lab Results  Component Value Date   VITAMINB12 427 05/01/2016   Lab Results  Component Value Date   TSH 7.04 (A) 02/15/2018      ASSESSMENT AND PLAN  63 y.o. year old female here to follow-up for newly diagnosed obstructive sleep apnea with initial CPAP.Compliance dated 07/16/2018-08/14/2018 shows compliance greater than 4 hours at 53%.  Average usage 4 hours 15 minutes.  Set pressure 5 to 15 cm.  EPR level 3 significant leak 95th percentile 26.2.  AHI 1.7  PLAN: CPAP compliance 53% greater than 4 hours Has significant leak needs mask refit Follow-up in 4 months Dennie Bible, [facility], [facility], APRN  [facility] [address] [place], [place] [address] [phone]

## 2018-12-19 ENCOUNTER — Telehealth: Payer: Self-pay | Admitting: *Deleted

## 2018-12-19 ENCOUNTER — Ambulatory Visit: Payer: Medicare Other | Admitting: Nurse Practitioner

## 2018-12-19 NOTE — Telephone Encounter (Signed)
Pt did not show for appt today

## 2018-12-20 ENCOUNTER — Encounter: Payer: Self-pay | Admitting: Nurse Practitioner

## 2020-03-08 IMAGING — US US RENAL
1 series · 14 of 25 positions shown · non-contrast
Comparison: Abdomen and pelvis CT dated 01/10/2016.

CLINICAL DATA: Acute kidney injury.

EXAM:
RENAL / URINARY TRACT ULTRASOUND COMPLETE

[Series 1: us renal · 14 of 59 slices shown]
[im 1/59]
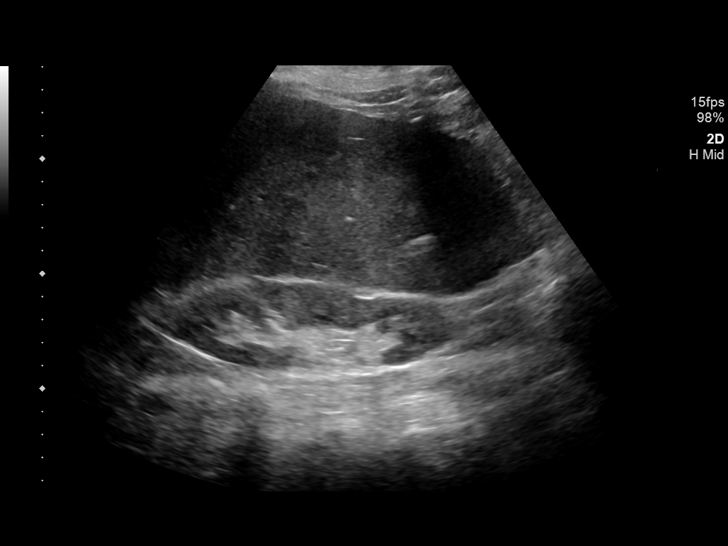
[im 5/59]
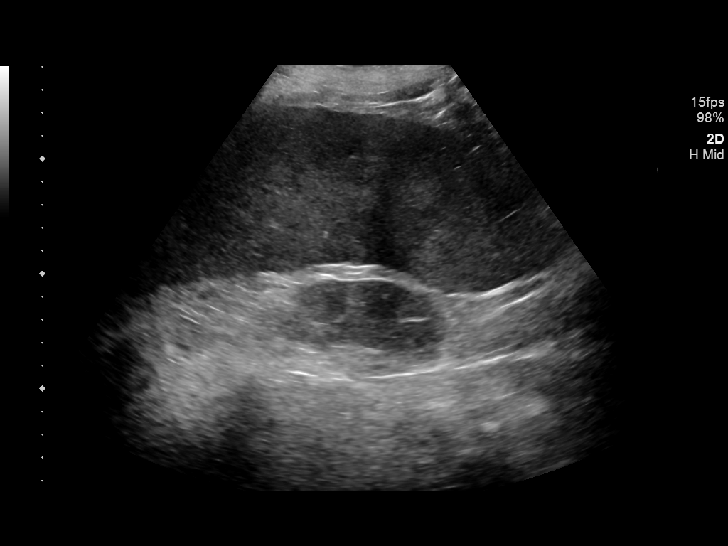
[im 10/59]
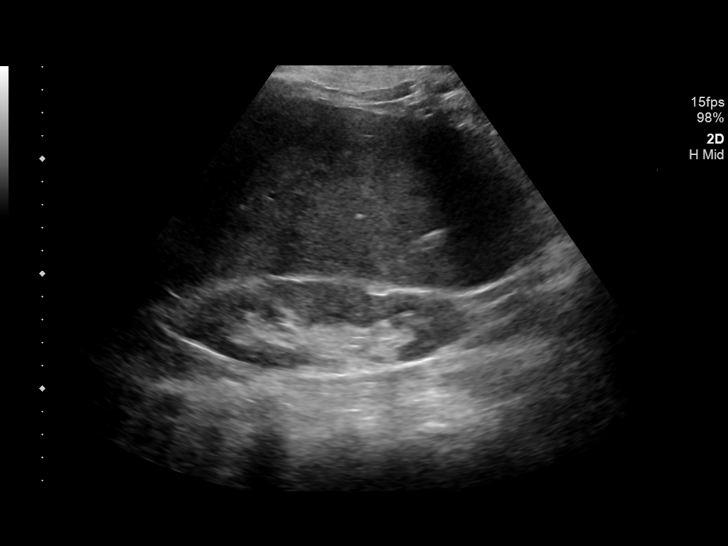
[im 15/59]
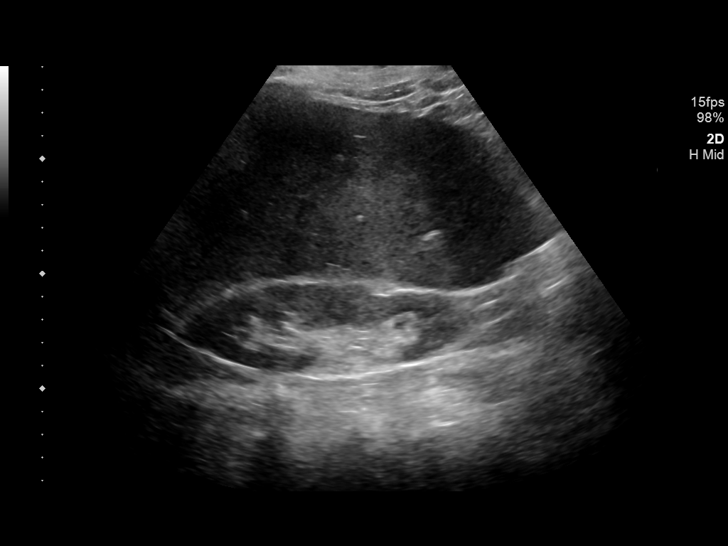
[im 20/59]
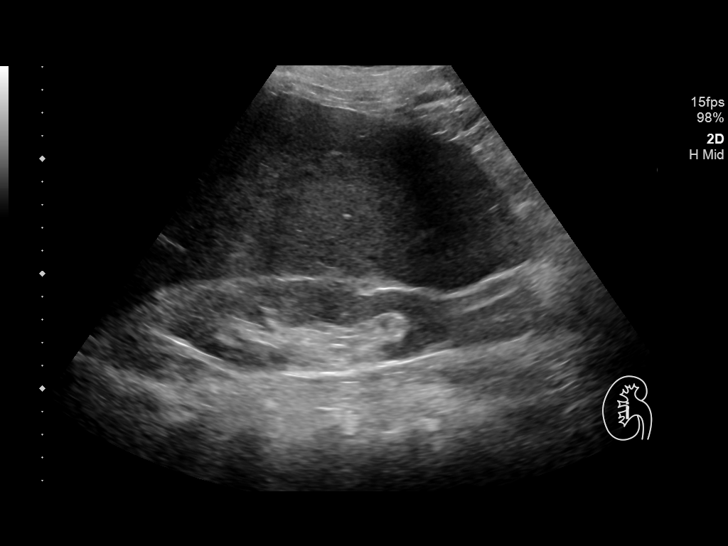
[im 22/59]
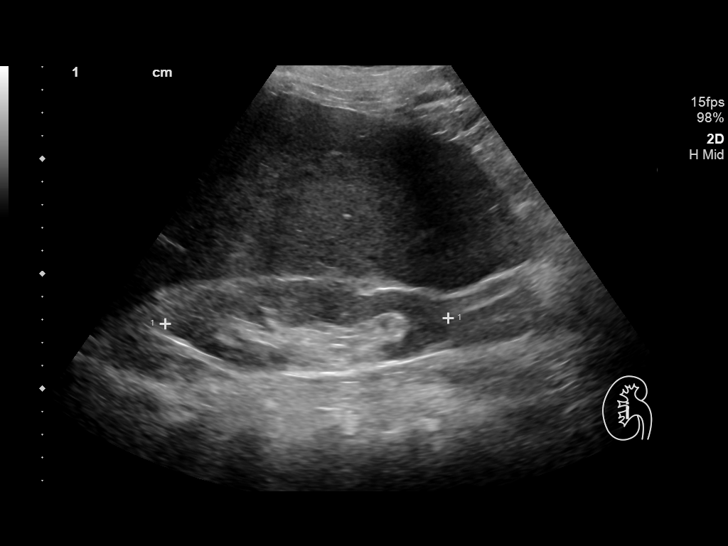
[im 27/59]
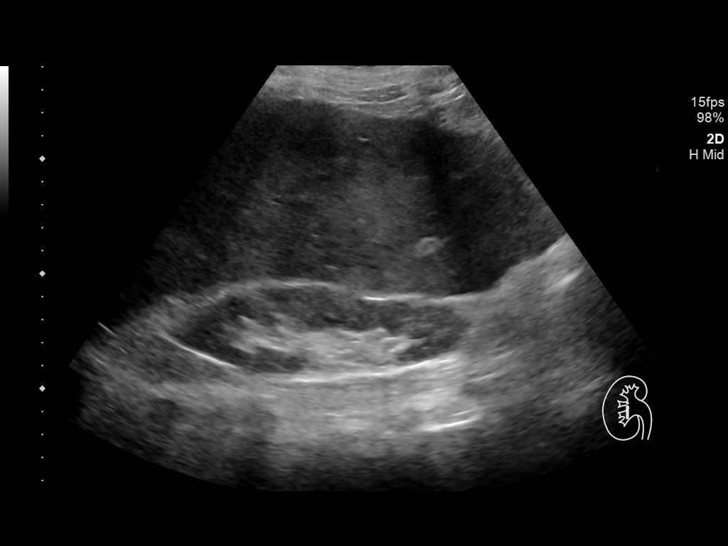
[im 32/59]
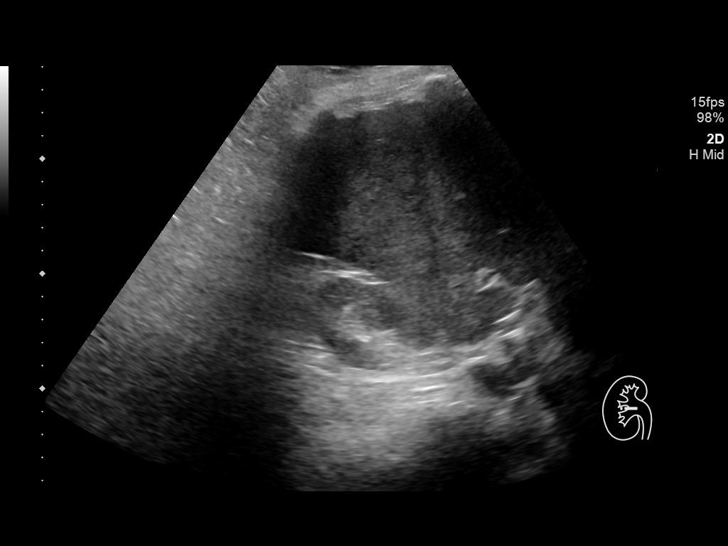
[im 37/59]
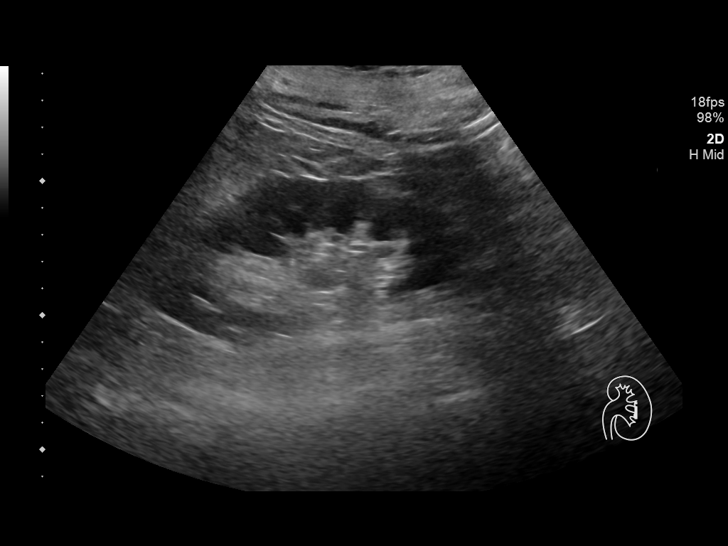
[im 39/59]
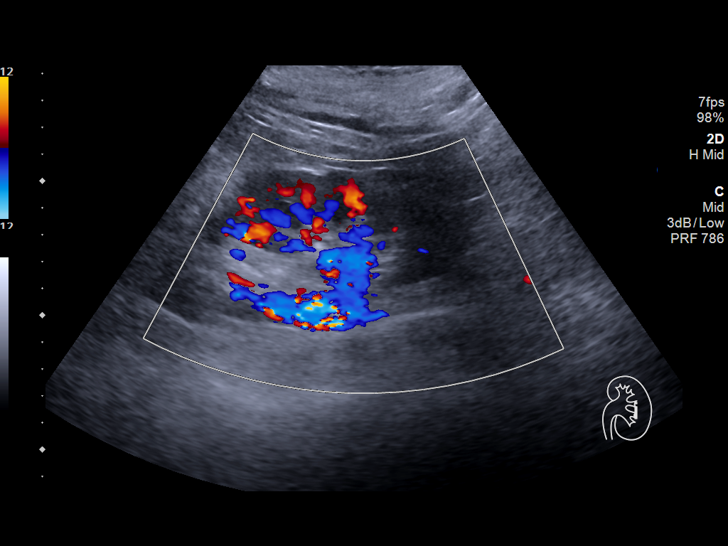
[im 44/59]
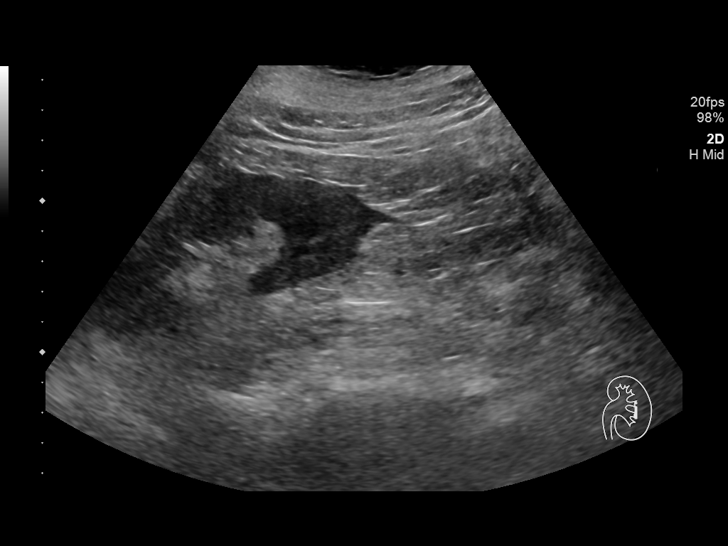
[im 49/59]
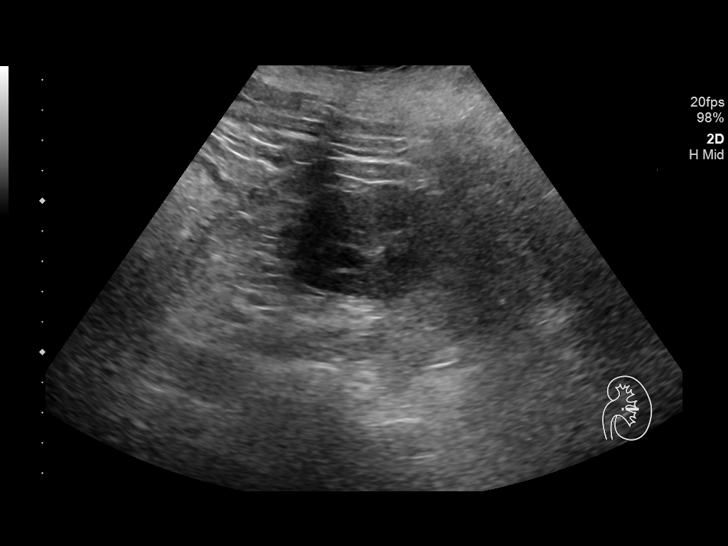
[im 54/59]
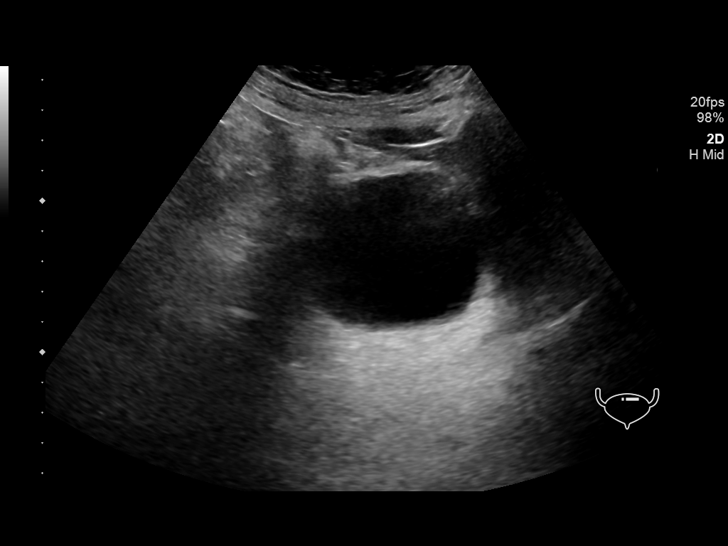
[im 59/59]
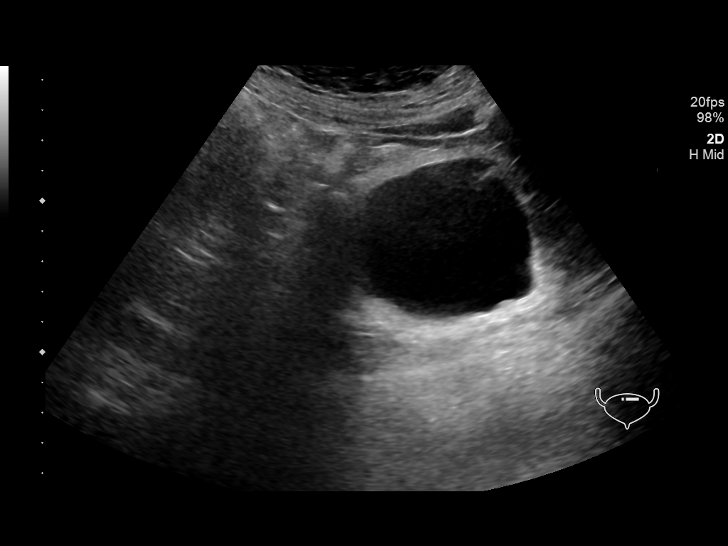

[14 of 25 positions shown; findings below may reference images not displayed]

FINDINGS: Right Kidney:

Length: 12.4 cm. Echogenicity within normal limits. No mass or
hydronephrosis visualized.

Left Kidney:

Length: 12.0 cm. Echogenicity within normal limits. No mass or
hydronephrosis visualized.

Bladder:

Appears normal for degree of bladder distention.
IMPRESSION: Normal examination.
